# Patient Record
Sex: Female | Born: 1940 | Race: White | Hispanic: No | Smoking: Former smoker
Health system: Southern US, Community
[De-identification: ages and names within clinical notes are randomized; demographics above are authoritative.]

## PROBLEM LIST (undated history)

## (undated) DIAGNOSIS — J449 Chronic obstructive pulmonary disease, unspecified: Secondary | ICD-10-CM

---

## 2007-07-20 ENCOUNTER — Encounter: Admission: RE | Admit: 2007-07-20 | Discharge: 2007-07-20 | Payer: Self-pay | Admitting: Family Medicine

## 2009-09-16 ENCOUNTER — Inpatient Hospital Stay (HOSPITAL_COMMUNITY): Admission: EM | Admit: 2009-09-16 | Discharge: 2009-09-18 | Payer: Self-pay | Admitting: Emergency Medicine

## 2009-09-17 ENCOUNTER — Encounter (INDEPENDENT_AMBULATORY_CARE_PROVIDER_SITE_OTHER): Payer: Self-pay | Admitting: Surgery

## 2010-04-17 LAB — CBC
HCT: 40.2 % (ref 36.0–46.0)
HCT: 43.6 % (ref 36.0–46.0)
Hemoglobin: 15.4 g/dL — ABNORMAL HIGH (ref 12.0–15.0)
MCH: 32.6 pg (ref 26.0–34.0)
MCHC: 34.4 g/dL (ref 30.0–36.0)
MCV: 94.7 fL (ref 78.0–100.0)
Platelets: 222 10*3/uL (ref 150–400)
RDW: 12.9 % (ref 11.5–15.5)
RDW: 12.9 % (ref 11.5–15.5)
WBC: 11.1 10*3/uL — ABNORMAL HIGH (ref 4.0–10.5)
WBC: 13.2 10*3/uL — ABNORMAL HIGH (ref 4.0–10.5)

## 2010-04-17 LAB — DIFFERENTIAL
Basophils Relative: 1 % (ref 0–1)
Eosinophils Absolute: 0.1 10*3/uL (ref 0.0–0.7)
Eosinophils Relative: 0 % (ref 0–5)
Lymphs Abs: 2.1 10*3/uL (ref 0.7–4.0)
Monocytes Absolute: 1.3 10*3/uL — ABNORMAL HIGH (ref 0.1–1.0)
Monocytes Relative: 10 % (ref 3–12)
Neutrophils Relative %: 73 % (ref 43–77)

## 2010-04-17 LAB — COMPREHENSIVE METABOLIC PANEL
ALT: 34 U/L (ref 0–35)
AST: 24 U/L (ref 0–37)
Alkaline Phosphatase: 66 U/L (ref 39–117)
GFR calc Af Amer: >60 mL/min (ref >60)
Glucose, Bld: 106 mg/dL — ABNORMAL HIGH (ref 70–99)
Potassium: 3.7 mEq/L (ref 3.5–5.1)
Sodium: 136 mEq/L (ref 135–145)
Total Protein: 8 g/dL (ref 6.0–8.3)

## 2010-04-17 LAB — HEPATIC FUNCTION PANEL
Bilirubin, Direct: 0.3 mg/dL (ref 0.0–0.3)
Indirect Bilirubin: 0.9 mg/dL (ref 0.3–0.9)
Total Bilirubin: 1.2 mg/dL (ref 0.3–1.2)

## 2010-04-17 LAB — LIPASE, BLOOD: Lipase: 29 U/L (ref 11–59)

## 2020-09-22 ENCOUNTER — Emergency Department (HOSPITAL_COMMUNITY): Payer: Medicare HMO

## 2020-09-22 ENCOUNTER — Other Ambulatory Visit: Payer: Self-pay

## 2020-09-22 ENCOUNTER — Inpatient Hospital Stay (HOSPITAL_COMMUNITY)
Admission: EM | Admit: 2020-09-22 | Discharge: 2020-10-21 | DRG: 871 | Disposition: A | Discharge status: Hospice | Payer: Medicare HMO | Attending: Internal Medicine | Admitting: Internal Medicine

## 2020-09-22 ENCOUNTER — Encounter (HOSPITAL_COMMUNITY): Payer: Self-pay

## 2020-09-22 DIAGNOSIS — E871 Hypo-osmolality and hyponatremia: Secondary | ICD-10-CM | POA: Diagnosis not present

## 2020-09-22 DIAGNOSIS — R7989 Other specified abnormal findings of blood chemistry: Secondary | ICD-10-CM | POA: Diagnosis not present

## 2020-09-22 DIAGNOSIS — I9589 Other hypotension: Secondary | ICD-10-CM | POA: Diagnosis not present

## 2020-09-22 DIAGNOSIS — I5031 Acute diastolic (congestive) heart failure: Secondary | ICD-10-CM | POA: Diagnosis not present

## 2020-09-22 DIAGNOSIS — R570 Cardiogenic shock: Secondary | ICD-10-CM | POA: Diagnosis not present

## 2020-09-22 DIAGNOSIS — I517 Cardiomegaly: Secondary | ICD-10-CM | POA: Diagnosis not present

## 2020-09-22 DIAGNOSIS — K760 Fatty (change of) liver, not elsewhere classified: Secondary | ICD-10-CM | POA: Diagnosis present

## 2020-09-22 DIAGNOSIS — I2781 Cor pulmonale (chronic): Secondary | ICD-10-CM | POA: Diagnosis present

## 2020-09-22 DIAGNOSIS — J432 Centrilobular emphysema: Secondary | ICD-10-CM | POA: Diagnosis present

## 2020-09-22 DIAGNOSIS — J189 Pneumonia, unspecified organism: Secondary | ICD-10-CM | POA: Diagnosis not present

## 2020-09-22 DIAGNOSIS — Z6835 Body mass index (BMI) 35.0-35.9, adult: Secondary | ICD-10-CM | POA: Diagnosis not present

## 2020-09-22 DIAGNOSIS — R0902 Hypoxemia: Secondary | ICD-10-CM | POA: Diagnosis present

## 2020-09-22 DIAGNOSIS — K761 Chronic passive congestion of liver: Secondary | ICD-10-CM | POA: Diagnosis present

## 2020-09-22 DIAGNOSIS — R531 Weakness: Secondary | ICD-10-CM

## 2020-09-22 DIAGNOSIS — R609 Edema, unspecified: Secondary | ICD-10-CM | POA: Diagnosis not present

## 2020-09-22 DIAGNOSIS — I5033 Acute on chronic diastolic (congestive) heart failure: Secondary | ICD-10-CM | POA: Diagnosis not present

## 2020-09-22 DIAGNOSIS — R579 Shock, unspecified: Secondary | ICD-10-CM | POA: Diagnosis not present

## 2020-09-22 DIAGNOSIS — D751 Secondary polycythemia: Secondary | ICD-10-CM | POA: Diagnosis present

## 2020-09-22 DIAGNOSIS — K5900 Constipation, unspecified: Secondary | ICD-10-CM | POA: Diagnosis not present

## 2020-09-22 DIAGNOSIS — Z20822 Contact with and (suspected) exposure to covid-19: Secondary | ICD-10-CM | POA: Diagnosis not present

## 2020-09-22 DIAGNOSIS — K56609 Unspecified intestinal obstruction, unspecified as to partial versus complete obstruction: Secondary | ICD-10-CM

## 2020-09-22 DIAGNOSIS — R109 Unspecified abdominal pain: Secondary | ICD-10-CM

## 2020-09-22 DIAGNOSIS — J9601 Acute respiratory failure with hypoxia: Secondary | ICD-10-CM | POA: Diagnosis not present

## 2020-09-22 DIAGNOSIS — T380X5A Adverse effect of glucocorticoids and synthetic analogues, initial encounter: Secondary | ICD-10-CM | POA: Diagnosis not present

## 2020-09-22 DIAGNOSIS — I2609 Other pulmonary embolism with acute cor pulmonale: Secondary | ICD-10-CM | POA: Diagnosis not present

## 2020-09-22 DIAGNOSIS — I509 Heart failure, unspecified: Secondary | ICD-10-CM | POA: Diagnosis not present

## 2020-09-22 DIAGNOSIS — R778 Other specified abnormalities of plasma proteins: Secondary | ICD-10-CM | POA: Diagnosis not present

## 2020-09-22 DIAGNOSIS — J9621 Acute and chronic respiratory failure with hypoxia: Secondary | ICD-10-CM | POA: Diagnosis not present

## 2020-09-22 DIAGNOSIS — I2721 Secondary pulmonary arterial hypertension: Secondary | ICD-10-CM | POA: Diagnosis present

## 2020-09-22 DIAGNOSIS — L89322 Pressure ulcer of left buttock, stage 2: Secondary | ICD-10-CM | POA: Diagnosis present

## 2020-09-22 DIAGNOSIS — R140 Abdominal distension (gaseous): Secondary | ICD-10-CM

## 2020-09-22 DIAGNOSIS — A419 Sepsis, unspecified organism: Secondary | ICD-10-CM | POA: Diagnosis not present

## 2020-09-22 DIAGNOSIS — L89312 Pressure ulcer of right buttock, stage 2: Secondary | ICD-10-CM | POA: Diagnosis present

## 2020-09-22 DIAGNOSIS — Z515 Encounter for palliative care: Secondary | ICD-10-CM | POA: Diagnosis not present

## 2020-09-22 DIAGNOSIS — Z66 Do not resuscitate: Secondary | ICD-10-CM | POA: Diagnosis not present

## 2020-09-22 DIAGNOSIS — E873 Alkalosis: Secondary | ICD-10-CM | POA: Diagnosis not present

## 2020-09-22 DIAGNOSIS — Z7951 Long term (current) use of inhaled steroids: Secondary | ICD-10-CM

## 2020-09-22 DIAGNOSIS — J9602 Acute respiratory failure with hypercapnia: Secondary | ICD-10-CM | POA: Diagnosis not present

## 2020-09-22 DIAGNOSIS — Z0184 Encounter for antibody response examination: Secondary | ICD-10-CM

## 2020-09-22 DIAGNOSIS — R0602 Shortness of breath: Secondary | ICD-10-CM

## 2020-09-22 DIAGNOSIS — E669 Obesity, unspecified: Secondary | ICD-10-CM | POA: Diagnosis present

## 2020-09-22 DIAGNOSIS — Z7189 Other specified counseling: Secondary | ICD-10-CM | POA: Diagnosis not present

## 2020-09-22 DIAGNOSIS — I50810 Right heart failure, unspecified: Secondary | ICD-10-CM | POA: Diagnosis present

## 2020-09-22 DIAGNOSIS — Z87891 Personal history of nicotine dependence: Secondary | ICD-10-CM

## 2020-09-22 DIAGNOSIS — K567 Ileus, unspecified: Secondary | ICD-10-CM | POA: Diagnosis not present

## 2020-09-22 DIAGNOSIS — J9622 Acute and chronic respiratory failure with hypercapnia: Secondary | ICD-10-CM | POA: Diagnosis not present

## 2020-09-22 DIAGNOSIS — R6520 Severe sepsis without septic shock: Secondary | ICD-10-CM | POA: Diagnosis not present

## 2020-09-22 DIAGNOSIS — N182 Chronic kidney disease, stage 2 (mild): Secondary | ICD-10-CM | POA: Diagnosis present

## 2020-09-22 DIAGNOSIS — I130 Hypertensive heart and chronic kidney disease with heart failure and stage 1 through stage 4 chronic kidney disease, or unspecified chronic kidney disease: Secondary | ICD-10-CM | POA: Diagnosis present

## 2020-09-22 DIAGNOSIS — R079 Chest pain, unspecified: Secondary | ICD-10-CM

## 2020-09-22 DIAGNOSIS — R0600 Dyspnea, unspecified: Secondary | ICD-10-CM

## 2020-09-22 DIAGNOSIS — H9190 Unspecified hearing loss, unspecified ear: Secondary | ICD-10-CM | POA: Diagnosis present

## 2020-09-22 DIAGNOSIS — K56600 Partial intestinal obstruction, unspecified as to cause: Secondary | ICD-10-CM | POA: Diagnosis not present

## 2020-09-22 DIAGNOSIS — R739 Hyperglycemia, unspecified: Secondary | ICD-10-CM | POA: Diagnosis not present

## 2020-09-22 DIAGNOSIS — R911 Solitary pulmonary nodule: Secondary | ICD-10-CM | POA: Diagnosis present

## 2020-09-22 DIAGNOSIS — I272 Pulmonary hypertension, unspecified: Secondary | ICD-10-CM | POA: Diagnosis not present

## 2020-09-22 DIAGNOSIS — L8990 Pressure ulcer of unspecified site, unspecified stage: Secondary | ICD-10-CM | POA: Diagnosis present

## 2020-09-22 DIAGNOSIS — I5082 Biventricular heart failure: Secondary | ICD-10-CM | POA: Diagnosis present

## 2020-09-22 DIAGNOSIS — R6521 Severe sepsis with septic shock: Secondary | ICD-10-CM | POA: Diagnosis present

## 2020-09-22 DIAGNOSIS — Z0189 Encounter for other specified special examinations: Secondary | ICD-10-CM

## 2020-09-22 DIAGNOSIS — R682 Dry mouth, unspecified: Secondary | ICD-10-CM | POA: Diagnosis not present

## 2020-09-22 DIAGNOSIS — I4891 Unspecified atrial fibrillation: Secondary | ICD-10-CM | POA: Diagnosis not present

## 2020-09-22 DIAGNOSIS — J441 Chronic obstructive pulmonary disease with (acute) exacerbation: Secondary | ICD-10-CM | POA: Diagnosis not present

## 2020-09-22 DIAGNOSIS — N179 Acute kidney failure, unspecified: Secondary | ICD-10-CM | POA: Diagnosis present

## 2020-09-22 DIAGNOSIS — N029 Recurrent and persistent hematuria with unspecified morphologic changes: Secondary | ICD-10-CM | POA: Diagnosis not present

## 2020-09-22 DIAGNOSIS — J9620 Acute and chronic respiratory failure, unspecified whether with hypoxia or hypercapnia: Secondary | ICD-10-CM | POA: Diagnosis not present

## 2020-09-22 DIAGNOSIS — Z7982 Long term (current) use of aspirin: Secondary | ICD-10-CM

## 2020-09-22 DIAGNOSIS — R21 Rash and other nonspecific skin eruption: Secondary | ICD-10-CM | POA: Diagnosis not present

## 2020-09-22 DIAGNOSIS — J9811 Atelectasis: Secondary | ICD-10-CM | POA: Diagnosis present

## 2020-09-22 DIAGNOSIS — K920 Hematemesis: Secondary | ICD-10-CM | POA: Diagnosis not present

## 2020-09-22 DIAGNOSIS — E876 Hypokalemia: Secondary | ICD-10-CM | POA: Diagnosis not present

## 2020-09-22 DIAGNOSIS — Z28310 Unvaccinated for covid-19: Secondary | ICD-10-CM

## 2020-09-22 DIAGNOSIS — Z634 Disappearance and death of family member: Secondary | ICD-10-CM

## 2020-09-22 DIAGNOSIS — I25110 Atherosclerotic heart disease of native coronary artery with unstable angina pectoris: Secondary | ICD-10-CM | POA: Diagnosis present

## 2020-09-22 DIAGNOSIS — B3749 Other urogenital candidiasis: Secondary | ICD-10-CM | POA: Diagnosis not present

## 2020-09-22 DIAGNOSIS — J9600 Acute respiratory failure, unspecified whether with hypoxia or hypercapnia: Secondary | ICD-10-CM

## 2020-09-22 DIAGNOSIS — N200 Calculus of kidney: Secondary | ICD-10-CM | POA: Diagnosis present

## 2020-09-22 HISTORY — DX: Chronic obstructive pulmonary disease, unspecified: J44.9

## 2020-09-22 LAB — MAGNESIUM: Magnesium: 2.6 mg/dL — ABNORMAL HIGH (ref 1.7–2.4)

## 2020-09-22 LAB — CBC WITH DIFFERENTIAL/PLATELET
Abs Immature Granulocytes: 0.6 10*3/uL — ABNORMAL HIGH (ref 0.00–0.07)
Basophils Absolute: 0 10*3/uL (ref 0.0–0.1)
Basophils Relative: 0 %
Eosinophils Absolute: 0 10*3/uL (ref 0.0–0.5)
Eosinophils Relative: 0 %
HCT: 47.4 % — ABNORMAL HIGH (ref 36.0–46.0)
Hemoglobin: 15.6 g/dL — ABNORMAL HIGH (ref 12.0–15.0)
Immature Granulocytes: 3 %
Lymphocytes Relative: 8 %
Lymphs Abs: 1.8 10*3/uL (ref 0.7–4.0)
MCH: 32.4 pg (ref 26.0–34.0)
MCHC: 32.9 g/dL (ref 30.0–36.0)
MCV: 98.3 fL (ref 80.0–100.0)
Monocytes Absolute: 2.5 10*3/uL — ABNORMAL HIGH (ref 0.1–1.0)
Monocytes Relative: 12 %
Neutro Abs: 16.7 10*3/uL — ABNORMAL HIGH (ref 1.7–7.7)
Neutrophils Relative %: 77 %
Platelets: 283 10*3/uL (ref 150–400)
RBC: 4.82 MIL/uL (ref 3.87–5.11)
RDW: 14.6 % (ref 11.5–15.5)
WBC: 21.6 10*3/uL — ABNORMAL HIGH (ref 4.0–10.5)
nRBC: 0.3 % — ABNORMAL HIGH (ref 0.0–0.2)

## 2020-09-22 LAB — FIBRINOGEN: Fibrinogen: >800 mg/dL — ABNORMAL HIGH (ref 210–475)

## 2020-09-22 LAB — BLOOD GAS, VENOUS
Acid-Base Excess: 3.1 mmol/L — ABNORMAL HIGH (ref 0.0–2.0)
Bicarbonate: 31.1 mmol/L — ABNORMAL HIGH (ref 20.0–28.0)
O2 Saturation: 68.3 %
Patient temperature: 98.6
pCO2, Ven: 63 mmHg — ABNORMAL HIGH (ref 44.0–60.0)
pH, Ven: 7.314 (ref 7.250–7.430)
pO2, Ven: 42.9 mmHg (ref 32.0–45.0)

## 2020-09-22 LAB — COMPREHENSIVE METABOLIC PANEL
ALT: 439 U/L — ABNORMAL HIGH (ref 0–44)
AST: 503 U/L — ABNORMAL HIGH (ref 15–41)
Albumin: 3.3 g/dL — ABNORMAL LOW (ref 3.5–5.0)
Alkaline Phosphatase: 102 U/L (ref 38–126)
Anion gap: 11 (ref 5–15)
BUN: 69 mg/dL — ABNORMAL HIGH (ref 8–23)
CO2: 28 mmol/L (ref 22–32)
Calcium: 8.5 mg/dL — ABNORMAL LOW (ref 8.9–10.3)
Chloride: 99 mmol/L (ref 98–111)
Creatinine, Ser: 1.28 mg/dL — ABNORMAL HIGH (ref 0.44–1.00)
GFR, Estimated: 43 mL/min — ABNORMAL LOW (ref >60)
Glucose, Bld: 125 mg/dL — ABNORMAL HIGH (ref 70–99)
Potassium: 3.6 mmol/L (ref 3.5–5.1)
Sodium: 138 mmol/L (ref 135–145)
Total Bilirubin: 1.4 mg/dL — ABNORMAL HIGH (ref 0.3–1.2)
Total Protein: 7.3 g/dL (ref 6.5–8.1)

## 2020-09-22 LAB — BLOOD GAS, ARTERIAL
Acid-Base Excess: 0.6 mmol/L (ref 0.0–2.0)
Bicarbonate: 27.9 mmol/L (ref 20.0–28.0)
FIO2: 52
O2 Saturation: 91.2 %
Patient temperature: 97.6
pCO2 arterial: 56.2 mmHg — ABNORMAL HIGH (ref 32.0–48.0)
pH, Arterial: 7.314 — ABNORMAL LOW (ref 7.350–7.450)
pO2, Arterial: 69.4 mmHg — ABNORMAL LOW (ref 83.0–108.0)

## 2020-09-22 LAB — CK: Total CK: 60 U/L (ref 38–234)

## 2020-09-22 LAB — FERRITIN: Ferritin: 1135 ng/mL — ABNORMAL HIGH (ref 11–307)

## 2020-09-22 LAB — TRIGLYCERIDES: Triglycerides: 78 mg/dL (ref <150)

## 2020-09-22 LAB — C-REACTIVE PROTEIN: CRP: 37.4 mg/dL — ABNORMAL HIGH (ref <1.0)

## 2020-09-22 LAB — LACTIC ACID, PLASMA: Lactic Acid, Venous: 1.7 mmol/L (ref 0.5–1.9)

## 2020-09-22 LAB — D-DIMER, QUANTITATIVE: D-Dimer, Quant: 1.97 ug/mL-FEU — ABNORMAL HIGH (ref 0.00–0.50)

## 2020-09-22 LAB — RESP PANEL BY RT-PCR (FLU A&B, COVID) ARPGX2
Influenza A by PCR: NEGATIVE
Influenza B by PCR: NEGATIVE
SARS Coronavirus 2 by RT PCR: NEGATIVE

## 2020-09-22 LAB — PROTIME-INR
INR: 1.3 — ABNORMAL HIGH (ref 0.8–1.2)
Prothrombin Time: 16 seconds — ABNORMAL HIGH (ref 11.4–15.2)

## 2020-09-22 LAB — BRAIN NATRIURETIC PEPTIDE: B Natriuretic Peptide: 889.7 pg/mL — ABNORMAL HIGH (ref 0.0–100.0)

## 2020-09-22 LAB — PROCALCITONIN: Procalcitonin: 0.48 ng/mL

## 2020-09-22 LAB — LACTATE DEHYDROGENASE: LDH: 518 U/L — ABNORMAL HIGH (ref 98–192)

## 2020-09-22 LAB — PHOSPHORUS: Phosphorus: 3.8 mg/dL (ref 2.5–4.6)

## 2020-09-22 MED ORDER — METHYLPREDNISOLONE SODIUM SUCC 125 MG IJ SOLR
60.0000 mg | Freq: Two times a day (BID) | INTRAMUSCULAR | Status: AC
  Administered 2020-09-22 – 2020-09-23 (×2): 60 mg via INTRAVENOUS
  Filled 2020-09-22 (×2): qty 2

## 2020-09-22 MED ORDER — SODIUM CHLORIDE 0.9 % IV SOLN
1.0000 g | Freq: Once | INTRAVENOUS | Status: AC
  Administered 2020-09-22: 1 g via INTRAVENOUS
  Filled 2020-09-22: qty 10

## 2020-09-22 MED ORDER — IPRATROPIUM-ALBUTEROL 0.5-2.5 (3) MG/3ML IN SOLN
3.0000 mL | Freq: Four times a day (QID) | RESPIRATORY_TRACT | Status: DC
  Administered 2020-09-22 – 2020-09-26 (×10): 3 mL via RESPIRATORY_TRACT
  Filled 2020-09-22 (×11): qty 3

## 2020-09-22 MED ORDER — IOHEXOL 350 MG/ML SOLN
80.0000 mL | Freq: Once | INTRAVENOUS | Status: AC | PRN
  Administered 2020-09-22: 80 mL via INTRAVENOUS

## 2020-09-22 MED ORDER — ASPIRIN EC 81 MG PO TBEC
81.0000 mg | DELAYED_RELEASE_TABLET | Freq: Every day | ORAL | Status: DC
  Administered 2020-09-23 – 2020-09-25 (×2): 81 mg via ORAL
  Filled 2020-09-22 (×2): qty 1

## 2020-09-22 MED ORDER — ENOXAPARIN SODIUM 40 MG/0.4ML IJ SOSY
40.0000 mg | PREFILLED_SYRINGE | INTRAMUSCULAR | Status: DC
  Administered 2020-09-22 – 2020-09-23 (×2): 40 mg via SUBCUTANEOUS
  Filled 2020-09-22 (×2): qty 0.4

## 2020-09-22 MED ORDER — ALBUTEROL SULFATE (2.5 MG/3ML) 0.083% IN NEBU: 2.5000 mg | INHALATION_SOLUTION | RESPIRATORY_TRACT | Status: DC | PRN

## 2020-09-22 MED ORDER — SODIUM CHLORIDE 0.9 % IV SOLN
75.0000 mL/h | INTRAVENOUS | Status: DC
  Administered 2020-09-22: 75 mL/h via INTRAVENOUS

## 2020-09-22 MED ORDER — GUAIFENESIN ER 600 MG PO TB12
600.0000 mg | ORAL_TABLET | Freq: Two times a day (BID) | ORAL | Status: DC
  Administered 2020-09-22 – 2020-10-08 (×31): 600 mg via ORAL
  Filled 2020-09-22 (×32): qty 1

## 2020-09-22 MED ORDER — SODIUM CHLORIDE 0.9 % IV SOLN
500.0000 mg | INTRAVENOUS | Status: DC
  Administered 2020-09-23 – 2020-09-24 (×2): 500 mg via INTRAVENOUS
  Filled 2020-09-22 (×2): qty 500

## 2020-09-22 MED ORDER — SODIUM CHLORIDE 0.9 % IV SOLN
2.0000 g | INTRAVENOUS | Status: AC
  Administered 2020-09-23 – 2020-09-29 (×7): 2 g via INTRAVENOUS
  Filled 2020-09-22 (×2): qty 2
  Filled 2020-09-22 (×2): qty 20
  Filled 2020-09-22 (×2): qty 2
  Filled 2020-09-22: qty 20

## 2020-09-22 MED ORDER — SODIUM CHLORIDE 0.9 % IV SOLN
500.0000 mg | Freq: Once | INTRAVENOUS | Status: AC
  Administered 2020-09-22: 500 mg via INTRAVENOUS
  Filled 2020-09-22: qty 500

## 2020-09-22 MED ORDER — PREDNISONE 20 MG PO TABS
40.0000 mg | ORAL_TABLET | Freq: Every day | ORAL | Status: DC
  Administered 2020-09-25: 40 mg via ORAL
  Filled 2020-09-22: qty 2

## 2020-09-22 NOTE — ED Triage Notes (Signed)
Pt reports shob, body aches, decreased appetite. Ems reports 78% on RA>, 94% on 6lpm [place]. A&O x4.

## 2020-09-22 NOTE — Plan of Care (Signed)

## 2020-09-22 NOTE — ED Provider Notes (Signed)
[facility]-EMERGENCY DEPT Provider Note   CSN: [id number] Arrival date & time: 09/22/20  1319     History Chief Complaint  Patient presents with   Shortness of Breath    Kimberly Ballard is a 80 y.o. female.  80 year old female presents with several days of cough congestion body aches.  Decreased appetite.  Patient had positive COVID exposure and has not had any of the COVID-vaccine series.  No vomiting or diarrhea.  No loss of taste or smell.  Endorses whole body weakness.  No urinary symptoms.  Called EMS and pulse oximetry was 78% room air which increased 94% on 6 L.      History reviewed. No pertinent past medical history.  There are no problems to display for this patient.   History reviewed. No pertinent surgical history.   OB History   No obstetric history on file.     History reviewed. No pertinent family history.     Home Medications Prior to Admission medications   Not on File    Allergies    Patient has no known allergies.  Review of Systems   Review of Systems  All other systems reviewed and are negative.  Physical Exam Updated Vital Signs BP 116/77   Pulse 80   Temp 98.2 F (36.8 C)   Resp 20   SpO2 95%   Physical Exam Vitals and nursing note reviewed.  Constitutional:      General: She is not in acute distress.    Appearance: Normal appearance. She is well-developed. She is not toxic-appearing.  HENT:     Head: Normocephalic and atraumatic.  Eyes:     General: Lids are normal.     Conjunctiva/sclera: Conjunctivae normal.     Pupils: Pupils are equal, round, and reactive to light.  Neck:     Thyroid: No thyroid mass.     Trachea: No tracheal deviation.  Cardiovascular:     Rate and Rhythm: Normal rate and regular rhythm.     Heart sounds: Normal heart sounds. No murmur heard.   No gallop.  Pulmonary:     Effort: Tachypnea and prolonged expiration present. No respiratory distress.     Breath sounds: Decreased  air movement present. No stridor. Examination of the right-lower field reveals decreased breath sounds. Examination of the left-lower field reveals decreased breath sounds. Decreased breath sounds present. No wheezing, rhonchi or rales.  Abdominal:     General: There is no distension.     Palpations: Abdomen is soft.     Tenderness: There is no abdominal tenderness. There is no rebound.  Musculoskeletal:        General: No tenderness. Normal range of motion.     Cervical back: Normal range of motion and neck supple.  Skin:    General: Skin is warm and dry.     Findings: No abrasion or rash.  Neurological:     Mental Status: She is alert and oriented to person, place, and time. Mental status is at baseline.     GCS: GCS eye subscore is 4. GCS verbal subscore is 5. GCS motor subscore is 6.     Cranial Nerves: Cranial nerves are intact. No cranial nerve deficit.     Sensory: No sensory deficit.     Motor: Motor function is intact.  Psychiatric:        Attention and Perception: Attention normal.        Speech: Speech normal.  Behavior: Behavior normal.    ED Results / Procedures / Treatments   Labs (all labs ordered are listed, but only abnormal results are displayed) Labs Reviewed  RESP PANEL BY RT-PCR (FLU A&B, COVID) ARPGX2  CULTURE, BLOOD (ROUTINE X 2)  CULTURE, BLOOD (ROUTINE X 2)  LACTIC ACID, PLASMA  LACTIC ACID, PLASMA  CBC WITH DIFFERENTIAL/PLATELET  COMPREHENSIVE METABOLIC PANEL  D-DIMER, QUANTITATIVE  PROCALCITONIN  LACTATE DEHYDROGENASE  FERRITIN  TRIGLYCERIDES  FIBRINOGEN  C-REACTIVE PROTEIN    EKG EKG Interpretation  Date/Time:  Monday September 22 2020 14:41:36 EDT Ventricular Rate:  77 PR Interval:  157 QRS Duration: 93 QT Interval:  431 QTC Calculation: 488 R Axis:   79 Text Interpretation: Sinus rhythm Abnormal T, consider ischemia, anterior leads Confirmed by Lorre Nick ([id number]) on 09/22/2020 2:59:14 PM  Radiology No results  found.  Procedures Procedures   Medications Ordered in ED Medications - No data to display  ED Course  I have reviewed the triage vital signs and the nursing notes.  Pertinent labs & imaging results that were available during my care of the patient were reviewed by me and considered in my medical decision making (see chart for details).    MDM Rules/Calculators/A&P                            Final Clinical Impression(s) / ED Diagnoses Final diagnoses:  None   Patient's COVID test is negative here.  Significant leukocytosis noted on CBC.  Blood cultures obtained.  Chest x-ray consistent with atypical infection.  Started on IV antibiotics.  D-dimer elevated here.  CT angio chest for PE ordered.  Signed out to Dr. Rodena Medin Rx / DC Orders ED Discharge Orders     None        Lorre Nick, MD 09/22/20 [phone]

## 2020-09-22 NOTE — ED Provider Notes (Signed)
Patient seen after prior ED provider.  Patient will be admitted for treatment of pneumonia.  She understands plan of care.  CT Chest was negative for PE.  Hospitalist service is aware of case and will evaluate for admission.   Wynetta Fines, MD 09/22/20 [phone]

## 2020-09-22 NOTE — H&P (Signed)
Kimberly Ballard UVO:[id number] DOB: 29-Feb-1940 DOA: 09/22/2020     PCP: System, Provider Not In   Outpatient Specialists:  NONE    Patient arrived to ER on 09/22/20 at 1319 Referred by Attending Toy Baker, MD   Patient coming from: home Lives alone,      Chief Complaint:   Chief Complaint  Patient presents with   Shortness of Breath    HPI: Kimberly Ballard is a 80 y.o. female with medical history significant of COPD     Presented with  SOB, body aches decreased appetite coughing.  Her daughter had COVID last week.  Patient went to her primary care provider on 18 August was started on Paxlovid empirically Due to risk of exposure.  She continued to progress and feel poorly at which point she called EMS on their arrival patient was satting 78% on room air started on 6 L and improved up to 94 Patient is not vaccinated for COVID No nausea no vomiting no diarrhea no loss of taste or smell.  No urinary complaints.  When she was prescribed Paxlovid she was told to stop her inhalers She used to smoke but quit Drinks EtOH occasionally Pt does not see doctors very often  Infectious risk factors:  Reports  fever, shortness of breath, dry cough,    Has   been vaccinated against COVID    Initial COVID TEST  NEGATIVE   Lab Results  Component Value Date   [place] 09/22/2020     Regarding pertinent Chronic problems:      COPD - not  followed by pulmonology      Recent Labs    09/22/20 1441  HGB 15.6*    While in ER: Patient tested negative for COVID chest x-ray showed bilateral infiltrates CTA negative for PE but with evidence of bilateral infiltrates also noted elevated white blood cell count patient started on Rocephin azithromycin for CAP coverage   ED Triage Vitals  Enc Vitals Group     BP 09/22/20 1334 127/77     Pulse Rate 09/22/20 1334 85     Resp 09/22/20 1334 20     Temp 09/22/20 1334 98.2 F (36.8 C)     Temp src --      SpO2 09/22/20  1334 93 %     Weight --      Height --      Head Circumference --      Peak Flow --      Pain Score 09/22/20 1338 6     Pain Loc --      Pain Edu? --      Excl. in [place]? --   TMAX(24)@     _________________________________________ Significant initial  Findings: Abnormal Labs Reviewed  CBC WITH DIFFERENTIAL/PLATELET - Abnormal; Notable for the following components:      Result Value   WBC 21.6 (*)    Hemoglobin 15.6 (*)    HCT 47.4 (*)    nRBC 0.3 (*)    Neutro Abs 16.7 (*)    Monocytes Absolute 2.5 (*)    Abs Immature Granulocytes 0.60 (*)    All other components within normal limits  COMPREHENSIVE METABOLIC PANEL - Abnormal; Notable for the following components:   Glucose, Bld 125 (*)    BUN 69 (*)    Creatinine, Ser 1.28 (*)    Calcium 8.5 (*)    Albumin 3.3 (*)    AST 503 (*)    ALT 439 (*)  Total Bilirubin 1.4 (*)    GFR, Estimated 43 (*)    All other components within normal limits  D-DIMER, QUANTITATIVE - Abnormal; Notable for the following components:   D-Dimer, Quant 1.97 (*)    All other components within normal limits  LACTATE DEHYDROGENASE - Abnormal; Notable for the following components:   LDH 518 (*)    All other components within normal limits  FERRITIN - Abnormal; Notable for the following components:   Ferritin 1,135 (*)    All other components within normal limits  FIBRINOGEN - Abnormal; Notable for the following components:   Fibrinogen >800 (*)    All other components within normal limits  C-REACTIVE PROTEIN - Abnormal; Notable for the following components:   CRP 37.4 (*)    All other components within normal limits  PROTIME-INR - Abnormal; Notable for the following components:   Prothrombin Time 16.0 (*)    INR 1.3 (*)    All other components within normal limits  BLOOD GAS, VENOUS - Abnormal; Notable for the following components:   pCO2, Ven 63.0 (*)    Bicarbonate 31.1 (*)    Acid-Base Excess 3.1 (*)    All other components within normal  limits  BRAIN NATRIURETIC PEPTIDE - Abnormal; Notable for the following components:   B Natriuretic Peptide 889.7 (*)    All other components within normal limits  MAGNESIUM - Abnormal; Notable for the following components:   Magnesium 2.6 (*)    All other components within normal limits  BLOOD GAS, ARTERIAL - Abnormal; Notable for the following components:   pH, Arterial 7.314 (*)    pCO2 arterial 56.2 (*)    pO2, Arterial 69.4 (*)    All other components within normal limits  TROPONIN I (HIGH SENSITIVITY) - Abnormal; Notable for the following components:   Troponin I (High Sensitivity) 128 (*)    All other components within normal limits   ____________________________________________ Ordered    CXR - infection    CTA chest -   no PE,  evidence of infiltrate   ______________________   ECG: Ordered Personally reviewed by me showing: HR : 77 Rhythm: NSR   nonspecific changes,  QTC 488 ____________  The recent clinical data is shown below. Vitals:   09/22/20 1645 09/22/20 1700 09/22/20 1736 09/22/20 1800  BP: 127/75 112/74 115/67 109/66  Pulse: 78 76 78 85  Resp: 20 18 (!) 22 (!) 26  Temp:      SpO2: 93% 93% 92% 92%    WBC     Component Value Date/Time   WBC 21.6 (H) 09/22/2020 1441   LYMPHSABS PENDING 09/22/2020 1441   MONOABS PENDING 09/22/2020 1441   EOSABS PENDING 09/22/2020 1441   BASOSABS PENDING 09/22/2020 1441    Lactic Acid, Venous    Component Value Date/Time   LATICACIDVEN 1.7 09/22/2020 1441     Procalcitonin 0.48      UA  ordered     Results for orders placed or performed during the hospital encounter of 09/22/20  Resp Panel by RT-PCR (Flu A&B, Covid) Nasopharyngeal Swab     Status: None   Collection Time: 09/22/20  2:41 PM   Specimen: Nasopharyngeal Swab; Nasopharyngeal(NP) swabs in vial transport medium  Result Value Ref Range Status   SARS Coronavirus 2 by RT PCR NEGATIVE NEGATIVE Final         Influenza A by PCR NEGATIVE NEGATIVE Final    Influenza B by PCR NEGATIVE NEGATIVE Final  _______________________________________________ Hospitalist was called for admission for CAP , acute respiratory failure with hypoxia - hypercapnia  The following Work up has been ordered so far:  Orders Placed This Encounter  Procedures   Resp Panel by RT-PCR (Flu A&B, Covid) Nasopharyngeal Swab   Blood Culture (routine x 2)   DG Chest Port 1 View   CT Angio Chest PE W/Cm &/Or Wo Cm   CBC WITH DIFFERENTIAL   Comprehensive metabolic panel   D-dimer, quantitative   Procalcitonin   Lactate dehydrogenase   Ferritin   Triglycerides   Fibrinogen   C-reactive protein   Diet NPO time specified   Cardiac monitoring   Insert peripheral IV x 2   Initiate Carrier Fluid Protocol   Place surgical mask on patient   Patient to wear surgical mask during transportation   Assess patient for ability to self-prone. If able (can move self in bed, ambulate) and stable (SpO2 and oxygen requirement):   RN/NT - Document specific oxygen requirements in [facility]   Notify EDP if new oxygen requirements escalates > 4L per minute [place]   RN to draw the following extra tubes:   Cardiac monitoring   Consult to hospitalist  Pneumonia   Airborne and Contact precautions   Pulse oximetry, continuous   ED EKG 12-Lead   Admit to Inpatient (patient's expected length of stay will be greater than 2 midnights or inpatient only procedure)      Following Medications were ordered in ER: Medications  azithromycin (ZITHROMAX) 500 mg in sodium chloride 0.9 % 250 mL IVPB (has no administration in time range)  cefTRIAXone (ROCEPHIN) 1 g in sodium chloride 0.9 % 100 mL IVPB (1 g Intravenous New Bag/Given 09/22/20 1736)  iohexol (OMNIPAQUE) 350 MG/ML injection 80 mL (80 mLs Intravenous Contrast Given 09/22/20 1723)        Consult Orders  (From admission, onward)           Start     Ordered   09/22/20 1806  Consult to hospitalist  Pneumonia  Once       Comments:  Pneumonia  Provider:  (Not yet assigned)  Question Answer Comment  Place call to: [facility]   Reason for Consult Admit      09/22/20 1805              OTHER Significant initial  Findings:  labs showing:    Recent Labs  Lab 09/22/20 1441  NA 138  K 3.6  CO2 28  GLUCOSE 125*  BUN 69*  CREATININE 1.28*  CALCIUM 8.5*     Cr  Up from baseline see below Lab Results  Component Value Date   CREATININE 1.28 (H) 09/22/2020   CREATININE 0.67 09/16/2009    Recent Labs  Lab 09/22/20 1441  AST 503*  ALT 439*  ALKPHOS 102  BILITOT 1.4*  PROT 7.3  ALBUMIN 3.3*   Lab Results  Component Value Date   CALCIUM 8.5 (L) 09/22/2020    Plt: Lab Results  Component Value Date   PLT 283 09/22/2020     COVID-19 Labs  Recent Labs    09/22/20 1441  DDIMER 1.97*  FERRITIN 1,135*  LDH 518*  CRP 37.4*    Lab Results  Component Value Date   SARSCOV2NAA NEGATIVE 09/22/2020    Venous  Blood Gas result:  pH 7.314 pCO2  63  ABG 7.314/56.2    Recent Labs  Lab 09/22/20 1441  WBC 21.6*  NEUTROABS PENDING  HGB 15.6*  HCT  47.4*  MCV 98.3  PLT 283    HG/HCT  Up from baseline see below    Component Value Date/Time   HGB 15.6 (H) 09/22/2020 1441   HCT 47.4 (H) 09/22/2020 1441   MCV 98.3 09/22/2020 1441          Cultures: No results found for: SDES, SPECREQUEST, CULT, REPTSTATUS   Radiological Exams on Admission: CT Angio Chest PE W/Cm &/Or Wo Cm  Result Date: 09/22/2020 CLINICAL DATA:  Shortness of breath, cough, elevated D-dimer EXAM: CT ANGIOGRAPHY CHEST WITH CONTRAST TECHNIQUE: Multidetector CT imaging of the chest was performed using the standard protocol during bolus administration of intravenous contrast. Multiplanar CT image reconstructions and MIPs were obtained to evaluate the vascular anatomy. CONTRAST:  48m OMNIPAQUE IOHEXOL 350 MG/ML SOLN COMPARISON:  Same day chest x-ray FINDINGS: Cardiovascular: Satisfactory opacification of the pulmonary  arteries. Evaluation of the distal pulmonary arterial branches within the bilateral lung bases is degraded by respiratory motion artifact. No filling defect is visualized to the segmental branch levels. Main pulmonary trunk is dilated measuring 4.3 cm in diameter suggestive of underlying pulmonary arterial hypertension. Thoracic aorta is nonaneurysmal. Atherosclerotic calcifications of the aorta and coronary arteries. Cardiomegaly. No pericardial effusion. Mediastinum/Nodes: Mildly prominent precarinal lymph node measures 11 mm short axis (series 5, image 84). Mildly prominent right hilar lymph node measuring approximately 11 mm short axis (series 5, image 110). Mildly prominent left hilar lymph node measuring 10 mm (series 5, image 112) no axillary lymphadenopathy. Thyroid and trachea within normal limits. Small hiatal hernia. No esophageal abnormality is evident. Lungs/Pleura: Dependent consolidations within the bilateral lower lobes and lingula. Additional patchy areas of consolidation predominantly within the dependent regions, some of which have a somewhat nodular configuration including 10 mm nodular opacity in the superior segment of the left lower lobe (series 10, image 51) and 8 mm nodular opacity within the posterior right middle lobe (series 10, image 88). Calcified pleural plaque at the posterior right lung base. No pleural effusion or pneumothorax. Diffuse bronchial wall thickening bilaterally. Upper Abdomen: Reflux of contrast into the IVC and hepatic veins. No acute findings are seen within the visualized upper abdomen. Musculoskeletal: Multilevel thoracic spondylosis with exaggerated thoracic kyphosis. No acute bony findings. No chest wall abnormality. Review of the MIP images confirms the above findings. IMPRESSION: 1. Negative for pulmonary embolism to the segmental branch levels. 2. Dependent consolidations within the bilateral lower lobes and lingula with additional patchy areas of opacity.  Findings are favored to represent multifocal pneumonia. Distribution raises the suspicion for aspiration. 3. Some of the focal areas of airspace opacity have a somewhat nodular configuration. A repeat chest CT in 3 months is recommended to exclude an underlying pulmonary nodule following a trial of antibiotic therapy. 4. Mildly prominent mediastinal and bilateral hilar lymph nodes, likely reactive. 5. Cardiomegaly with evidence of underlying pulmonary arterial hypertension. 6. Reflux of contrast into the IVC and hepatic veins suggesting right heart dysfunction. 7. Small hiatal hernia. Aortic Atherosclerosis (ICD10-I70.0). Electronically Signed   By: NDavina PokeD.O.   On: 09/22/2020 18:01   DG Chest Port 1 View  Result Date: 09/22/2020 CLINICAL DATA:  Shortness of breath and body aches. Hypoxia. Leukocytosis. EXAM: PORTABLE CHEST 1 VIEW COMPARISON:  None. FINDINGS: The cardiac silhouette is enlarged. The hila are also prominent bilaterally which may reflect enlarged pulmonary arteries or lymphadenopathy. Aortic atherosclerosis is noted. The interstitial markings are diffusely increased in both lungs with peribronchial thickening. No sizable pleural effusion or pneumothorax is  identified. No acute osseous abnormality is seen. IMPRESSION: 1. Cardiomegaly with bilateral interstitial densities which may reflect infection (including atypical/viral etiologies) versus edema. 2. Bilateral hilar lymphadenopathy versus pulmonary arterial enlargement. Electronically Signed   By: Logan Bores M.D.   On: 09/22/2020 15:32   _______________________________________________________________________________________________________ Latest  Blood pressure 109/66, pulse 85, temperature 98.2 F (36.8 C), resp. rate (!) 26, SpO2 92 %.   Review of Systems:    Pertinent positives include:  Fevers, chills, fatigue,   Constitutional:  No weight loss, night sweats, weight loss  HEENT:  No headaches, Difficulty  swallowing,Tooth/dental problems,Sore throat,  No sneezing, itching, ear ache, nasal congestion, post nasal drip,  Cardio-vascular:  No chest pain, Orthopnea, PND, anasarca, dizziness, palpitations.no Bilateral lower extremity swelling  GI:  No heartburn, indigestion, abdominal pain, nausea, vomiting, diarrhea, change in bowel habits, loss of appetite, melena, blood in stool, hematemesis Resp:  no shortness of breath at rest. No dyspnea on exertion, No excess mucus, no productive cough, No non-productive cough, No coughing up of blood.No change in color of mucus.No wheezing. Skin:  no rash or lesions. No jaundice GU:  no dysuria, change in color of urine, no urgency or frequency. No straining to urinate.  No flank pain.  Musculoskeletal:  No joint pain or no joint swelling. No decreased range of motion. No back pain.  Psych:  No change in mood or affect. No depression or anxiety. No memory loss.  Neuro: no localizing neurological complaints, no tingling, no weakness, no double vision, no gait abnormality, no slurred speech, no confusion  All systems reviewed and apart from [place] all are negative _______________________________________________________________________________________________ Past Medical History:  History reviewed. No pertinent past medical history.    History reviewed. No pertinent surgical history.  Social History:  Ambulatory   independently       reports that she has quit smoking. Her smoking use included cigarettes. She has never used smokeless tobacco. She reports current alcohol use of about 2.0 standard drinks per week. She reports that she does not use drugs.    Family History:   Family History  Problem Relation Age of Onset   Diabetes Neg Hx    CAD Neg Hx    ______________________________________________________________________________________________ Allergies: No Known Allergies   Prior to Admission medications   Medication Sig Start Date End  Date Taking? Authorizing Provider  aspirin 81 MG EC tablet Take by mouth.    [provider]  PAXLOVID, 300/100, 20 x 150 MG & 10 x 100MG TBPK Take 3 tablets by mouth 2 (two) times daily. 09/18/20   [provider]  TRELEGY ELLIPTA 100-62.5-25 MCG/INH AEPB Inhale 1 puff into the lungs daily. 08/26/20   [provider]    ___________________________________________________________________________________________________ Physical Exam: YBOFBP with BMI 09/22/2020 09/22/2020 02/02/5850  Systolic 778 242 353  Diastolic 66 67 74  Pulse 85 78 76     1. General:  in No  Acute distress   acutely ill -appearing 2. Psychological: Alert and  Oriented 3. Head/ENT:    Dry Mucous Membranes                          Head Non traumatic, neck supple                         Poor Dentition 4. SKIN: decreased Skin turgor,  Skin clean Dry and intact no rash 5. Heart: Regular rate and rhythm no  Murmur,  no Rub or gallop 6. Lungs:   some wheezes and  crackles   7. Abdomen: Soft,  non-tender, Non distended   obese  bowel sounds present 8. Lower extremities: no clubbing, cyanosis, no  edema 9. Neurologically Grossly intact, moving all 4 extremities equally  intact 10. MSK: Normal range of motion    Chart has been reviewed  ______________________________________________________________________________________________  Assessment/Plan  80 y.o. female with medical history significant of COPD  Admitted for multilobar PNA    Present on Admission:  Sepsis ([place]) -  -SIRS criteria met with  elevated white blood cell count,       Component Value Date/Time   WBC 21.6 (H) 09/22/2020 1441   LYMPHSABS 1.8 09/22/2020 1441    fever   RR >20   -Most likely source being: pulmonary,   Patient meeting criteria for Severe sepsis with    evidence of end organ damage/organ dysfunction such as      Acute hypoxia requiring new supplemental oxygen, SpO2: 93 % O2 Flow Rate (L/min): 8 L/min      - Obtain serial lactic acid and procalcitonin level.  - Initiated IV antibiotics in ER: Antibiotics Given (last 72 hours)     Date/Time Action Medication Dose Rate   09/22/20 1736 New Bag/Given   cefTRIAXone (ROCEPHIN) 1 g in sodium chloride 0.9 % 100 mL IVPB 1 g 200 mL/hr   09/22/20 1839 New Bag/Given   azithromycin (ZITHROMAX) 500 mg in sodium chloride 0.9 % 250 mL IVPB 500 mg 250 mL/hr     Will continue     - await results of blood and urine culture  - Rehydrate aggressively  Intravenous fluids were administered,      12:37 AM   CAP (community acquired pneumonia) -  - will admit for treatment of CAP will start on appropriate antibiotic coverage.   Obtain:  sputum cultures,                  Obtain respiratory panel                   blood cultures if febrile or if decompensates.                   strep pneumo UA antigen,                   check for Legionella.                Provide oxygen as needed.   If continues to deteriorate would consult PCCM COVID-negative although inflammatory markers elevated.  Patient has had 2 days of Paxlovid and stopped.  Never tested positive for COVID   Acute respiratory failure with hypoxia and hypercapnia ([place])- ABG showing chronic hypercapnia discussed with PCCM who is aware pulmonology consult in a.m.  Repeat ABG at 3 AM to make sure the patient does not become more significantly hypercarbic and decompensates. Patient likely has underlying severe COPD   Elevated LFTs - possilby sepsis responce obtain hepatitis serologies patient does drink occasional alcohol but not significantly denies Tylenol overuse. Per Family   Cardiomegaly -obtain echogram CT scan worrisome for right heart failure.  Suspect likely underlying pulmonary hypertension. Would appreciate pulmonology/cardiology consult and follow-up establishment   Elevated troponin -likely demand ischemia in the setting of hypoxia.  Emailed cardiology.  Obtain echogram continue to cycle  cardiac enzymes  COPD likely  COPD exacerbation.  We will make sure has albuterol as needed and scheduled DuoNeb.  Steroid taper would benefit from pulmonology consult and follow-up  Other plan as per orders.  DVT prophylaxis:   Lovenox       Code Status:    Code Status: Not on file FULL CODE   as per patient   I had personally discussed CODE STATUS with patient     Family Communication:   Family not at  Bedside  plan of care was discussed on the phone with   Son,    Disposition Plan:    To home once workup is complete and patient is stable   Following barriers for discharge:                            Electrolytes corrected                                                                Afebrile, white count improving able to transition to PO antibiotics                             Will need to be able to tolerate PO                            Will likely need home health, home O2, set up                           Will need consultants to evaluate patient prior to discharge                       Would benefit from PT/OT eval prior to DC  Ordered                    Transition of care consulted                   Nutrition    consulted                                      Consults called:  emailed cardiology  Admission status:  ED Disposition     ED Disposition  [place]: [place] [[id number]]  Level of Care: Progressive [102]  Admit to Progressive based on following criteria: MULTISYSTEM THREATS such as stable sepsis, metabolic/electrolyte imbalance with or without encephalopathy that is responding to early treatment.  May admit patient to [facility] or [facility] if equivalent level of care is available:: No  Covid Evaluation: Confirmed COVID Negative  Diagnosis: CAP (community acquired pneumonia) [[id number]]  Admitting Physician: Toy Baker [[id number]]  Attending Physician: Toy Baker [[id number]]  Estimated  length of stay: past midnight tomorrow  Certification:: I certify this patient will need inpatient services for at least 2 midnights            inpatient     I Expect 2 midnight stay secondary to severity of patient's current illness need for inpatient interventions justified by the following:  hemodynamic  instability despite optimal treatment (tachycardia  tachypnea  hypoxia, hypercapnia)  Severe lab/radiological/exam abnormalities including:    Multilobar PNA and extensive comorbidities including:  COPD/asthma    That are currently affecting medical management.   I expect  patient to be hospitalized for 2 midnights requiring inpatient medical care.  Patient is at high risk for adverse outcome (such as loss of life or disability) if not treated.  Indication for inpatient stay as follows:   Hemodynamic instability despite maximal medical therapy,    inability to maintain oral hydration    New or worsening hypoxia  Need for IV antibiotics, IV fluids,     Level of care   progressive  tele indefinitely please discontinue once patient no longer qualifies COVID-19 Labs    Lab Results  Component Value Date   [place] 09/22/2020     Precautions: admitted as  Covid Negative    PPE: Used by the provider:   N95   eye Goggles,  Gloves    Katalyna Socarras 09/23/2020, 1:01 AM    [facility]     after 2 AM please page floor coverage PA If 7AM-7PM, please contact the day team taking care of the patient using [email or web address]   Patient was evaluated in the context of the global COVID-19 pandemic, which necessitated consideration that the patient might be at risk for infection with the SARS-CoV-2 virus that causes COVID-19. Institutional protocols and algorithms that pertain to the evaluation of patients at risk for COVID-19 are in a state of rapid change based on information released by regulatory bodies including the CDC and federal and state organizations.  These policies and algorithms were followed during the patient's care.

## 2020-09-23 ENCOUNTER — Inpatient Hospital Stay (HOSPITAL_COMMUNITY): Payer: Medicare HMO

## 2020-09-23 ENCOUNTER — Telehealth: Payer: Self-pay | Admitting: Physician Assistant

## 2020-09-23 DIAGNOSIS — J9601 Acute respiratory failure with hypoxia: Secondary | ICD-10-CM

## 2020-09-23 DIAGNOSIS — R7989 Other specified abnormal findings of blood chemistry: Secondary | ICD-10-CM | POA: Diagnosis not present

## 2020-09-23 DIAGNOSIS — J9602 Acute respiratory failure with hypercapnia: Secondary | ICD-10-CM

## 2020-09-23 DIAGNOSIS — I517 Cardiomegaly: Secondary | ICD-10-CM

## 2020-09-23 DIAGNOSIS — R778 Other specified abnormalities of plasma proteins: Secondary | ICD-10-CM | POA: Diagnosis not present

## 2020-09-23 DIAGNOSIS — R0600 Dyspnea, unspecified: Secondary | ICD-10-CM | POA: Diagnosis not present

## 2020-09-23 DIAGNOSIS — J189 Pneumonia, unspecified organism: Secondary | ICD-10-CM | POA: Diagnosis not present

## 2020-09-23 LAB — CBC WITH DIFFERENTIAL/PLATELET
Abs Immature Granulocytes: 0.54 10*3/uL — ABNORMAL HIGH (ref 0.00–0.07)
Basophils Absolute: 0 10*3/uL (ref 0.0–0.1)
Basophils Relative: 0 %
Eosinophils Absolute: 0 10*3/uL (ref 0.0–0.5)
Eosinophils Relative: 0 %
HCT: 49.4 % — ABNORMAL HIGH (ref 36.0–46.0)
Hemoglobin: 15.7 g/dL — ABNORMAL HIGH (ref 12.0–15.0)
Immature Granulocytes: 3 %
Lymphocytes Relative: 4 %
Lymphs Abs: 0.8 10*3/uL (ref 0.7–4.0)
MCH: 32 pg (ref 26.0–34.0)
MCHC: 31.8 g/dL (ref 30.0–36.0)
MCV: 100.6 fL — ABNORMAL HIGH (ref 80.0–100.0)
Monocytes Absolute: 1.5 10*3/uL — ABNORMAL HIGH (ref 0.1–1.0)
Monocytes Relative: 8 %
Neutro Abs: 16.9 10*3/uL — ABNORMAL HIGH (ref 1.7–7.7)
Neutrophils Relative %: 85 %
Platelets: 253 10*3/uL (ref 150–400)
RBC: 4.91 MIL/uL (ref 3.87–5.11)
RDW: 14.7 % (ref 11.5–15.5)
WBC: 19.7 10*3/uL — ABNORMAL HIGH (ref 4.0–10.5)
nRBC: 0.2 % (ref 0.0–0.2)

## 2020-09-23 LAB — RESPIRATORY PANEL BY PCR

## 2020-09-23 LAB — BLOOD GAS, ARTERIAL
Acid-Base Excess: 1.8 mmol/L (ref 0.0–2.0)
Bicarbonate: 29.4 mmol/L — ABNORMAL HIGH (ref 20.0–28.0)
O2 Saturation: 91 %
Patient temperature: 97.6
pCO2 arterial: 58.5 mmHg — ABNORMAL HIGH (ref 32.0–48.0)
pH, Arterial: 7.318 — ABNORMAL LOW (ref 7.350–7.450)
pO2, Arterial: 68.4 mmHg — ABNORMAL LOW (ref 83.0–108.0)

## 2020-09-23 LAB — PHOSPHORUS: Phosphorus: 3.5 mg/dL (ref 2.5–4.6)

## 2020-09-23 LAB — ECHOCARDIOGRAM COMPLETE
Area-P 1/2: 2.95 cm2
Calc EF: 60.5 %
Height: 63 in
S' Lateral: 2.5 cm
Single Plane A2C EF: 61.8 %
Single Plane A4C EF: 62.4 %
Weight: 3206.4 oz

## 2020-09-23 LAB — COMPREHENSIVE METABOLIC PANEL
ALT: 327 U/L — ABNORMAL HIGH (ref 0–44)
AST: 170 U/L — ABNORMAL HIGH (ref 15–41)
Albumin: 3.1 g/dL — ABNORMAL LOW (ref 3.5–5.0)
Alkaline Phosphatase: 97 U/L (ref 38–126)
Anion gap: 12 (ref 5–15)
BUN: 69 mg/dL — ABNORMAL HIGH (ref 8–23)
CO2: 25 mmol/L (ref 22–32)
Calcium: 8.3 mg/dL — ABNORMAL LOW (ref 8.9–10.3)
Chloride: 102 mmol/L (ref 98–111)
Creatinine, Ser: 1.04 mg/dL — ABNORMAL HIGH (ref 0.44–1.00)
GFR, Estimated: 55 mL/min — ABNORMAL LOW (ref >60)
Glucose, Bld: 141 mg/dL — ABNORMAL HIGH (ref 70–99)
Potassium: 3.9 mmol/L (ref 3.5–5.1)
Sodium: 139 mmol/L (ref 135–145)
Total Bilirubin: 1.1 mg/dL (ref 0.3–1.2)
Total Protein: 6.8 g/dL (ref 6.5–8.1)

## 2020-09-23 LAB — TROPONIN I (HIGH SENSITIVITY)
Troponin I (High Sensitivity): 128 ng/L (ref <18)
Troponin I (High Sensitivity): 100 ng/L (ref <18)

## 2020-09-23 LAB — LIPID PANEL
Cholesterol: 104 mg/dL (ref 0–200)
HDL: 25 mg/dL — ABNORMAL LOW (ref >40)
LDL Cholesterol: 63 mg/dL (ref 0–99)
Total CHOL/HDL Ratio: 4.2 RATIO
Triglycerides: 80 mg/dL (ref <150)
VLDL: 16 mg/dL (ref 0–40)

## 2020-09-23 LAB — HEPATITIS PANEL, ACUTE
HCV Ab: NONREACTIVE
Hep A IgM: NONREACTIVE
Hep B C IgM: NONREACTIVE
Hepatitis B Surface Ag: NONREACTIVE

## 2020-09-23 LAB — D-DIMER, QUANTITATIVE: D-Dimer, Quant: 2.04 ug/mL-FEU — ABNORMAL HIGH (ref 0.00–0.50)

## 2020-09-23 LAB — MAGNESIUM: Magnesium: 2.7 mg/dL — ABNORMAL HIGH (ref 1.7–2.4)

## 2020-09-23 LAB — TSH: TSH: 1.988 u[IU]/mL (ref 0.350–4.500)

## 2020-09-23 LAB — PROCALCITONIN: Procalcitonin: 0.49 ng/mL

## 2020-09-23 MED ORDER — FUROSEMIDE 10 MG/ML IJ SOLN
40.0000 mg | Freq: Once | INTRAMUSCULAR | Status: AC
  Administered 2020-09-23: 40 mg via INTRAVENOUS
  Filled 2020-09-23: qty 4

## 2020-09-23 MED ORDER — CHLORHEXIDINE GLUCONATE CLOTH 2 % EX PADS
6.0000 | MEDICATED_PAD | Freq: Every day | CUTANEOUS | Status: DC
  Administered 2020-09-24 – 2020-10-20 (×27): 6 via TOPICAL

## 2020-09-23 NOTE — Telephone Encounter (Signed)
I have scheduled this patient for a PFT and OV with Dr. Judeth Horn on 10/17/2020 per request of Dr. Rutherford Guys. PFT is at 10 am and then OV is right after at 11 am. This will print on AVS when patient is discharged. If anything else is needed please let me know. Routing to providers as FYI.

## 2020-09-23 NOTE — Progress Notes (Signed)
eLink Physician-Brief Progress Note Patient Name: Kimberly Ballard DOB: 01/19/1941 MRN: [id number]   Date of Service  09/23/2020  HPI/Events of Note  Patient admitted with acute hypoxemic respiratory failure and lung infiltrates suggestive of pneumonitis in the context of Covid exposure.  eICU Interventions  New Patient Evaluation.        Migdalia Dk 09/23/2020, 8:27 PM

## 2020-09-23 NOTE — Evaluation (Signed)
Clinical/Bedside Swallow Evaluation Patient Details  Name: Kimberly Ballard MRN: [id number] Date of Birth: 1940-11-29  Today's Date: 09/23/2020 Time: SLP Start Time (ACUTE ONLY): 1520 SLP Stop Time (ACUTE ONLY): 1601 SLP Time Calculation (min) (ACUTE ONLY): 41 min  Past Medical History:  Past Medical History:  Diagnosis Date   COPD (chronic obstructive pulmonary disease) (HCC)    Past Surgical History: History reviewed. No pertinent surgical history. HPI:  80 yo female adm to [facility] with respiratory difficulties.  Pt with PMH + for tobacco use, COPD. Pt was around family member that recently had COVID 19- and pt tested negative upon admit.  She was diagnosed with multifocal pneumonia, diastolic CHF, with increased troponins.  Pt's esophagus showed no abnormality per imaging - but pt has a Small hiatal hernia.  Swallow evaluation ordered.  Pt denies dysphagia nor reflux prior to admit.   Assessment / Plan / Recommendation Clinical Impression  No focal CN deficits apparent and pt easily passed 3 ounce Yale water challenge.  She was observed to consume 3 ounces peaches, 4 ounces thin water, 4 ounces sherbert and 3 graham crackers. Throat clearing post swallow with x4 of 11 swallows of peaches increasing in incidence as po progressed.  No coughing or throat clearing with intake of graham crackers nor ice cream.  Pt pointed to her right lateral neck to indicate area of "film" after swallowing peaches - ? due to syrup infiltration.  Pt denies relux symptoms - however given her significant dysphonia, inconsistent throat clearing after swallowing water, peaches and thin liquids - SLP questions if she could be experiencing "silent reflux".  Recommend to consider dedicated esophageal evaluation -- esophagram? to assure reflux is not contributing to pulmonary issues.  Of note, pt advised her vocal hoarseness initiated prior to pulmonary symptoms.  SLP provided pt with education for compensations given her current  respiratory status - focusing on exhale-swallow -exhale pattern for airway safety measure, starting intake with liquids given xerostomia and importance of oral care for pulmonary hygiene. Will follow up once this week to assure pt tolerating po intake and for ultiliy of and implementation of swallow precautions.  Provided pt information in writing and using teach back.  RN informed. SLP Visit Diagnosis: Dysphagia, unspecified (R13.10)    Aspiration Risk  Mild aspiration risk    Diet Recommendation Regular;Thin liquid   Liquid Administration via: Cup;Straw Medication Administration: Whole meds with liquid Supervision: Patient able to self feed Compensations: Minimize environmental distractions;Slow rate;Small sips/bites (start intake with liquids) Postural Changes: Seated upright at 90 degrees;Remain upright for at least 30 minutes after po intake    Other  Recommendations Oral Care Recommendations: Oral care BID   Follow up Recommendations None      Frequency and Duration min 1 x/week  1 week       Prognosis Prognosis for Safe Diet Advancement: Good      Swallow Study   General Date of Onset: 09/23/20 HPI: 80 yo female adm to [facility] with respiratory difficulties.  Pt with PMH + for tobacco use, COPD. Pt was around family member that recently had COVID 19- and pt tested negative upon admit.  She was diagnosed with multifocal pneumonia, diastolic CHF, with increased troponins.  Pt's esophagus showed no abnormality per imaging - but pt has a Small hiatal hernia.  Swallow evaluation ordered.  Pt denies dysphagia nor reflux prior to admit. Type of Study: Bedside Swallow Evaluation Diet Prior to this Study: Regular;Thin liquids Temperature Spikes Noted: No Respiratory Status:  Nasal cannula (7 liters) History of Recent Intubation: No Behavior/Cognition: Alert;Cooperative;Pleasant mood Oral Cavity Assessment: Within Functional Limits Oral Care Completed by SLP: No Oral Cavity - Dentition:  Adequate natural dentition;Other (Comment) (lower partial - does not fit well and she does not use it to eat) Vision: Functional for self-feeding Self-Feeding Abilities: Able to feed self Patient Positioning: Upright in bed Baseline Vocal Quality: Hoarse (hoarse - pt reports hoarseness occured first before her respiratory issues) Volitional Cough: Strong (nonproductive) Volitional Swallow: Able to elicit    Oral/Motor/Sensory Function Overall Oral Motor/Sensory Function: Within functional limits   Ice Chips Ice chips: Within functional limits Presentation: Spoon   Thin Liquid Thin Liquid: Impaired Pharyngeal  Phase Impairments: Throat Clearing - Delayed Other Comments: throat clear x2 of 6 swallows    Nectar Thick Nectar Thick Liquid: Not tested   Honey Thick Honey Thick Liquid: Not tested   Puree Puree: Within functional limits Presentation: Self Fed;Spoon   Solid     Solid: Impaired Presentation: Self Fed Oral Phase Impairments: Reduced lingual movement/coordination;Impaired mastication Oral Phase Functional Implications: Impaired mastication;Prolonged oral transit Pharyngeal Phase Impairments: Throat Clearing - Delayed Other Comments: throat clearing x4 of 11 swallows of peaches - no issues with crackers causing throat clearing or indication of dysphagia; Pt pointed to her right lateral neck to indicate area of "film" after swallowing peaches - ? due to syrup infiltration      Chales Abrahams 09/23/2020,4:34 PM [name], MS [facility] SLP Acute Rehab Services Office [phone] Pager [phone]

## 2020-09-23 NOTE — Progress Notes (Signed)
  Echocardiogram 2D Echocardiogram has been performed.  Janalyn Harder 09/23/2020, 12:39 PM

## 2020-09-23 NOTE — Progress Notes (Signed)
OT Cancellation Note  Patient Details Name: JENNALYNN RIVARD MRN: [id number] DOB: 25-Mar-1940   Cancelled Treatment:    Reason Eval/Treat Not Completed: Other (comment). Per conversation with pt's RN, pt has been SOB with minimal activity, has needed up to 9L O2 (RA at baseline). RN requests holding OT for today to focus on stabilizing respiratory function. Plan to reattempt tomorrow.   Raynald Kemp, OT Acute Rehabilitation Services Pager: [phone] Office: [phone]  09/23/2020, 12:51 PM

## 2020-09-23 NOTE — Progress Notes (Signed)
[facility]  PROGRESS NOTE  Kimberly Ballard KNL:[id number] DOB: [birth year] DOA: 09/22/2020 PCP: System, Provider Not In   Brief HPI:   80 year old female with history of COPD presented with shortness of breath, body aches, decreased appetite and coughing.  Patients daughter was diagnosed with COVID last week.  She was started on Paxil empirically due to risk of exposure.  Patient continued to get worse and developed shortness of breath.  On EMS arrival she required 6 L/min of oxygen.  In the ED SARS-CoV-2 RT-PCR was negative.  Chest x-ray showed bilateral infiltrates, CTA was negative for PE but showed evidence of bilateral infiltrates.  Patient started on Rocephin and Zithromax for community-acquired pneumonia.    Subjective   Still continues to have shortness of breath.  BNP was elevated to 884.   Assessment/Plan:    Acute hypoxemic respiratory failure -Multifactorial from underlying multifocal pneumonia, diastolic CHF -Patient is currently on Rocephin and Zithromax -Follow blood culture results -We will give 1 dose of Lasix 40 mg IV -Speech therapy consulted for swallow evaluation -Appreciate PCCM consult  Acute CHF, diastolic versus systolic -Echocardiogram is currently pending -She has bilateral crackles on auscultation -We will give 1 dose of Lasix 40 mg IV -Cardiology has been consulted  Elevated troponin -Likely demand ischemia -EKG shows T wave abnormalities in anterior chest leads -Cardiology has been consulted  Transaminitis -Unclear etiology -?  Passive liver congestion from CHF -Total bilirubin is normal, alk phos is normal -Follow LFTs in a.m., if persistently elevated, consider abdominal ultrasound  Right middle lobe nodule -Will need repeat CT in 3 months after treatment of pneumonia   Scheduled medications:    aspirin EC  81 mg Oral Daily   enoxaparin (LOVENOX) injection  40 mg Subcutaneous Q24H   guaiFENesin  600 mg Oral BID    ipratropium-albuterol  3 mL Nebulization Q6H   [START ON 09/24/2020] predniSONE  40 mg Oral Q breakfast         Data Reviewed:   CBG:  No results for input(s): GLUCAP in the last 168 hours.  SpO2: (!) 88 % O2 Flow Rate (L/min): 8 L/min    Vitals:   09/23/20 0535 09/23/20 0834 09/23/20 0955 09/23/20 1328  BP: 122/76  136/75   Pulse: 71  75   Resp:   (!) 26   Temp:   97.9 F (36.6 C)   TempSrc:   Oral   SpO2: 94% 97% 95% (!) 88%  Weight:      Height:         Intake/Output Summary (Last 24 hours) at 09/23/2020 1442 Last data filed at 09/23/2020 1200 Gross per 24 hour  Intake 593.98 ml  Output 1 ml  Net 592.98 ml    08/21 1901 - 08/23 0700 In: 594 [I.V.:494] Out: -   Filed Weights   09/22/20 2020  Weight: 90.9 kg    CBC:  Recent Labs  Lab 09/22/20 1441 09/23/20 0207  WBC 21.6* 19.7*  HGB 15.6* 15.7*  HCT 47.4* 49.4*  PLT 283 253  MCV 98.3 100.6*  MCH 32.4 32.0  MCHC 32.9 31.8  RDW 14.6 14.7  LYMPHSABS 1.8 0.8  MONOABS 2.5* 1.5*  EOSABS 0.0 0.0  BASOSABS 0.0 0.0    Complete metabolic panel:  Recent Labs  Lab 09/22/20 1441 09/22/20 1500 09/22/20 2050 09/22/20 2148 09/23/20 0207  NA 138  --   --   --  139  K 3.6  --   --   --  3.9  CL 99  --   --   --  102  CO2 28  --   --   --  25  GLUCOSE 125*  --   --   --  141*  BUN 69*  --   --   --  69*  CREATININE 1.28*  --   --   --  1.04*  CALCIUM 8.5*  --   --   --  8.3*  AST 503*  --   --   --  170*  ALT 439*  --   --   --  327*  ALKPHOS 102  --   --   --  97  BILITOT 1.4*  --   --   --  1.1  ALBUMIN 3.3*  --   --   --  3.1*  MG  --  2.6*  --   --  2.7*  CRP 37.4*  --   --   --   --   DDIMER 1.97*  --   --   --   --   PROCALCITON 0.48  --   --   --  0.49  LATICACIDVEN 1.7  --   --   --   --   INR  --   --  1.3*  --   --   TSH  --   --   --   --  1.988  BNP  --   --   --  889.7*  --     No results for input(s): LIPASE, AMYLASE in the last 168 hours.  Recent Labs  Lab  09/22/20 1441 09/22/20 2148 09/23/20 0207  CRP 37.4*  --   --   DDIMER 1.97*  --   --   BNP  --  889.7*  --   PROCALCITON 0.48  --  0.49  SARSCOV2NAA NEGATIVE  --   --     ------------------------------------------------------------------------------------------------------------------ Recent Labs    09/22/20 1441 09/23/20 0207  CHOL  --  104  HDL  --  25*  LDLCALC  --  63  TRIG 78 80  CHOLHDL  --  4.2    No results found for: HGBA1C ------------------------------------------------------------------------------------------------------------------ Recent Labs    09/23/20 0207  TSH 1.988   ------------------------------------------------------------------------------------------------------------------ Recent Labs    09/22/20 1441  FERRITIN 1,135*    Coagulation profile Recent Labs  Lab 09/22/20 2050  INR 1.3*   Recent Labs    09/22/20 1441  DDIMER 1.97*    Cardiac Enzymes Recent Labs  Lab 09/22/20 2148  CKTOTAL 60    ------------------------------------------------------------------------------------------------------------------    Component Value Date/Time   BNP 889.7 (H) 09/22/2020 2148     Antibiotics: Anti-infectives (From admission, onward)    Start     Dose/Rate Route Frequency Ordered Stop   09/23/20 1800  cefTRIAXone (ROCEPHIN) 2 g in sodium chloride 0.9 % 100 mL IVPB        2 g 200 mL/hr over 30 Minutes Intravenous Every 24 hours 09/22/20 1842 09/28/20 1759   09/23/20 1800  azithromycin (ZITHROMAX) 500 mg in sodium chloride 0.9 % 250 mL IVPB        500 mg 250 mL/hr over 60 Minutes Intravenous Every 24 hours 09/22/20 1842 09/28/20 1759   09/22/20 1630  cefTRIAXone (ROCEPHIN) 1 g in sodium chloride 0.9 % 100 mL IVPB        1 g 200 mL/hr over 30 Minutes Intravenous  Once 09/22/20 1618 09/22/20 1806   09/22/20  1630  azithromycin (ZITHROMAX) 500 mg in sodium chloride 0.9 % 250 mL IVPB        500 mg 250 mL/hr over 60 Minutes Intravenous   Once 09/22/20 1618 09/22/20 1939        Radiology Reports  CT Angio Chest PE W/Cm &/Or Wo Cm  Result Date: 09/22/2020 CLINICAL DATA:  Shortness of breath, cough, elevated D-dimer EXAM: CT ANGIOGRAPHY CHEST WITH CONTRAST TECHNIQUE: Multidetector CT imaging of the chest was performed using the standard protocol during bolus administration of intravenous contrast. Multiplanar CT image reconstructions and MIPs were obtained to evaluate the vascular anatomy. CONTRAST:  77m OMNIPAQUE IOHEXOL 350 MG/ML SOLN COMPARISON:  Same day chest x-ray FINDINGS: Cardiovascular: Satisfactory opacification of the pulmonary arteries. Evaluation of the distal pulmonary arterial branches within the bilateral lung bases is degraded by respiratory motion artifact. No filling defect is visualized to the segmental branch levels. Main pulmonary trunk is dilated measuring 4.3 cm in diameter suggestive of underlying pulmonary arterial hypertension. Thoracic aorta is nonaneurysmal. Atherosclerotic calcifications of the aorta and coronary arteries. Cardiomegaly. No pericardial effusion. Mediastinum/Nodes: Mildly prominent precarinal lymph node measures 11 mm short axis (series 5, image 84). Mildly prominent right hilar lymph node measuring approximately 11 mm short axis (series 5, image 110). Mildly prominent left hilar lymph node measuring 10 mm (series 5, image 112) no axillary lymphadenopathy. Thyroid and trachea within normal limits. Small hiatal hernia. No esophageal abnormality is evident. Lungs/Pleura: Dependent consolidations within the bilateral lower lobes and lingula. Additional patchy areas of consolidation predominantly within the dependent regions, some of which have a somewhat nodular configuration including 10 mm nodular opacity in the superior segment of the left lower lobe (series 10, image 51) and 8 mm nodular opacity within the posterior right middle lobe (series 10, image 88). Calcified pleural plaque at the  posterior right lung base. No pleural effusion or pneumothorax. Diffuse bronchial wall thickening bilaterally. Upper Abdomen: Reflux of contrast into the IVC and hepatic veins. No acute findings are seen within the visualized upper abdomen. Musculoskeletal: Multilevel thoracic spondylosis with exaggerated thoracic kyphosis. No acute bony findings. No chest wall abnormality. Review of the MIP images confirms the above findings. IMPRESSION: 1. Negative for pulmonary embolism to the segmental branch levels. 2. Dependent consolidations within the bilateral lower lobes and lingula with additional patchy areas of opacity. Findings are favored to represent multifocal pneumonia. Distribution raises the suspicion for aspiration. 3. Some of the focal areas of airspace opacity have a somewhat nodular configuration. A repeat chest CT in 3 months is recommended to exclude an underlying pulmonary nodule following a trial of antibiotic therapy. 4. Mildly prominent mediastinal and bilateral hilar lymph nodes, likely reactive. 5. Cardiomegaly with evidence of underlying pulmonary arterial hypertension. 6. Reflux of contrast into the IVC and hepatic veins suggesting right heart dysfunction. 7. Small hiatal hernia. Aortic Atherosclerosis (ICD10-I70.0). Electronically Signed   By: NDavina PokeD.O.   On: 09/22/2020 18:01   DG Chest Port 1 View  Result Date: 09/22/2020 CLINICAL DATA:  Shortness of breath and body aches. Hypoxia. Leukocytosis. EXAM: PORTABLE CHEST 1 VIEW COMPARISON:  None. FINDINGS: The cardiac silhouette is enlarged. The hila are also prominent bilaterally which may reflect enlarged pulmonary arteries or lymphadenopathy. Aortic atherosclerosis is noted. The interstitial markings are diffusely increased in both lungs with peribronchial thickening. No sizable pleural effusion or pneumothorax is identified. No acute osseous abnormality is seen. IMPRESSION: 1. Cardiomegaly with bilateral interstitial densities which  may reflect infection (including atypical/viral etiologies)  versus edema. 2. Bilateral hilar lymphadenopathy versus pulmonary arterial enlargement. Electronically Signed   By: Logan Bores M.D.   On: 09/22/2020 15:32   US Abdomen Limited RUQ (LIVER/GB)  Result Date: 09/23/2020 CLINICAL DATA:  80 year old female with abnormal LFTs. EXAM: ULTRASOUND ABDOMEN LIMITED RIGHT UPPER QUADRANT COMPARISON:  CTA chest 09/22/2020. FINDINGS: Gallbladder: Absent as seen on the CTA yesterday. Common bile duct: Diameter: 3 mm, normal. Liver: Echogenic liver (images 14, 18). No discrete liver lesion. Portal vein is patent on color Doppler imaging with normal direction of blood flow towards the liver. Mildly nodular liver contour on some images such (images 29 and 35). Other: Visible right kidney is within normal limits.  No free fluid. IMPRESSION: 1. Hepatic steatosis, with mildly nodular liver contour raising the possibility of cirrhosis. 2. Absent gallbladder with no evidence of bile duct obstruction. Electronically Signed   By: Genevie Ann M.D.   On: 09/23/2020 06:45      DVT prophylaxis: Lovenox  Code Status: Full code  Family Communication: No family at bedside   Consultants: PCCM Cardiology  Procedures:     Objective    Physical Examination:  General-appears in no acute distress Heart-S1-S2, regular, no murmur auscultated Lungs-bibasilar crackles auscultated, scattered wheezing Abdomen-soft, nontender, no organomegaly Extremities-trace edema in the lower extremities Neuro-alert, oriented x3, no focal deficit noted   Status is: Inpatient  Dispo: The patient is from: Home              Anticipated d/c is to: Home              Anticipated d/c date is: 09/28/2020              Patient currently currently not stable for discharge  Barrier to discharge-ongoing treatment for acute hypoxemic respiratory failure  COVID-19 Labs  Recent Labs    09/22/20 1441  DDIMER 1.97*  FERRITIN 1,135*  LDH  518*  CRP 37.4*    Lab Results  Component Value Date   [facility] NEGATIVE 09/22/2020    Microbiology  Recent Results (from the past 240 hour(s))  Resp Panel by RT-PCR (Flu A&B, Covid) Nasopharyngeal Swab     Status: None   Collection Time: 09/22/20  2:41 PM   Specimen: Nasopharyngeal Swab; Nasopharyngeal(NP) swabs in vial transport medium  Result Value Ref Range Status   SARS Coronavirus 2 by RT PCR NEGATIVE NEGATIVE Final    Comment: (NOTE) SARS-CoV-2 target nucleic acids are NOT DETECTED.  The SARS-CoV-2 RNA is generally detectable in upper respiratory specimens during the acute phase of infection. The lowest concentration of SARS-CoV-2 viral copies this assay can detect is 138 copies/mL. A negative result does not preclude SARS-Cov-2 infection and should not be used as the sole basis for treatment or other patient management decisions. A negative result may occur with  improper specimen collection/handling, submission of specimen other than nasopharyngeal swab, presence of viral mutation(s) within the areas targeted by this assay, and inadequate number of viral copies(<138 copies/mL). A negative result must be combined with clinical observations, patient history, and epidemiological information. The expected result is Negative.  Fact Sheet for Patients:  EntrepreneurPulse.com.au  Fact Sheet for Healthcare Providers:  IncredibleEmployment.be  This test is no t yet approved or cleared by the [place] FDA and  has been authorized for detection and/or diagnosis of SARS-CoV-2 by FDA under an Emergency Use Authorization (EUA). This EUA will remain  in effect (meaning this test can be used) for the duration of the COVID-19 declaration  under Section 564(b)(1) of the Act, 21 U.S.C.section 360bbb-3(b)(1), unless the authorization is terminated  or revoked sooner.       Influenza A by PCR NEGATIVE NEGATIVE Final   Influenza B by PCR  NEGATIVE NEGATIVE Final    Comment: (NOTE) The Xpert Xpress SARS-CoV-2/FLU/RSV plus assay is intended as an aid in the diagnosis of influenza from Nasopharyngeal swab specimens and should not be used as a sole basis for treatment. Nasal washings and aspirates are unacceptable for Xpert Xpress SARS-CoV-2/FLU/RSV testing.  Fact Sheet for Patients: EntrepreneurPulse.com.au  Fact Sheet for Healthcare Providers: IncredibleEmployment.be  This test is not yet approved or cleared by the [place] FDA and has been authorized for detection and/or diagnosis of SARS-CoV-2 by FDA under an Emergency Use Authorization (EUA). This EUA will remain in effect (meaning this test can be used) for the duration of the COVID-19 declaration under Section 564(b)(1) of the Act, 21 U.S.C. section 360bbb-3(b)(1), unless the authorization is terminated or revoked.  Performed at [facility], [place] [address], [place], [place] [address]   Blood Culture (routine x 2)     Status: None (Preliminary result)   Collection Time: 09/22/20  2:41 PM   Specimen: BLOOD  Result Value Ref Range Status   Specimen Description   Final    BLOOD BLOOD LEFT FOREARM Performed at [facility] [address], [place], [place] [address]    Special Requests   Final    BOTTLES DRAWN AEROBIC AND ANAEROBIC Blood Culture adequate volume Performed at [facility] [address], [place], [place] [address]    Culture   Final    NO GROWTH < 24 HOURS Performed at [facility] [address] [place], [place] [address]    Report Status PENDING  Incomplete  Blood Culture (routine x 2)     Status: None (Preliminary result)   Collection Time: 09/22/20  5:20 PM   Specimen: BLOOD LEFT HAND  Result Value Ref Range Status   Specimen Description   Final    BLOOD LEFT HAND Performed at [facility], [place] [address], [place],  [place] [address]    Special Requests   Final    BOTTLES DRAWN AEROBIC AND ANAEROBIC Blood Culture results may not be optimal due to an excessive volume of blood received in culture bottles Performed at [facility] [address], [place], [place] [address]    Culture   Final    NO GROWTH < 12 HOURS Performed at [facility] [address] [place], [place] [address]    Report Status PENDING  Incomplete  Respiratory (~20 pathogens) panel by PCR     Status: None   Collection Time: 09/22/20 10:47 PM   Specimen: Nasopharyngeal Swab; Respiratory  Result Value Ref Range Status   Adenovirus NOT DETECTED NOT DETECTED Final   Coronavirus 229E NOT DETECTED NOT DETECTED Final    Comment: (NOTE) The Coronavirus on the Respiratory Panel, DOES NOT test for the novel  Coronavirus (2019 nCoV)    Coronavirus HKU1 NOT DETECTED NOT DETECTED Final   Coronavirus NL63 NOT DETECTED NOT DETECTED Final   Coronavirus OC43 NOT DETECTED NOT DETECTED Final   Metapneumovirus NOT DETECTED NOT DETECTED Final   Rhinovirus / Enterovirus NOT DETECTED NOT DETECTED Final   Influenza A NOT DETECTED NOT DETECTED Final   Influenza B NOT DETECTED NOT DETECTED Final   Parainfluenza Virus 1 NOT DETECTED NOT DETECTED Final  Parainfluenza Virus 2 NOT DETECTED NOT DETECTED Final   Parainfluenza Virus 3 NOT DETECTED NOT DETECTED Final   Parainfluenza Virus 4 NOT DETECTED NOT DETECTED Final   Respiratory Syncytial Virus NOT DETECTED NOT DETECTED Final   Bordetella pertussis NOT DETECTED NOT DETECTED Final   Bordetella Parapertussis NOT DETECTED NOT DETECTED Final   Chlamydophila pneumoniae NOT DETECTED NOT DETECTED Final   Mycoplasma pneumoniae NOT DETECTED NOT DETECTED Final    Comment: Performed at [facility], [place] [address], [place], [place] [address]             [facility] If 7PM-7AM, please contact night-coverage at [email or web address], Office   [phone]   09/23/2020, 2:42 PM  LOS: 1 day

## 2020-09-23 NOTE — Progress Notes (Signed)
Patient transferred to room 1235, bedside report was given. Patient's son, Arlys John, was called and given an update on patient's status and was also given patient's new room number.

## 2020-09-23 NOTE — Progress Notes (Signed)
Skin assessment completed with Randa Evens, RN. Patient had scabs present on bilateral lower extremities. Patient also has a rash/dermatitis present in abdominal folds

## 2020-09-23 NOTE — Progress Notes (Signed)
PT Cancellation Note  Patient Details Name: Kimberly Ballard MRN: [id number] DOB: 04-02-1940   Cancelled Treatment:    Reason Eval/Treat Not Completed: Order received. Chart reviewed. RN has requested PT hold therapy on today. Will check back another day as schedule allow. Thanks.    Faye Ramsay, PT Acute Rehabilitation  Office: [phone] Pager: [phone]

## 2020-09-23 NOTE — Progress Notes (Signed)
Patient had a critical lab result. Patient's troponin resulted at 128. J.Daniels and A.Doutova, paged via secure chat and made aware.

## 2020-09-23 NOTE — Consult Note (Signed)
Cardiology Consultation:   Patient ID: Kimberly Ballard MRN: [id number]; DOB: 03-09-1940  Admit date: 09/22/2020 Date of Consult: 09/23/2020  PCP:  System, Provider Not In   [facility] Providers Cardiologist: New to Dr. Mayford Knife   Patient Profile:   Kimberly Ballard is a 80 y.o. female with a hx of COPD with prior tobacco smoking, quit in 2019 who is being seen 09/23/2020 for the evaluation of Elevated troponin  at the request of Dr. Adela Glimpse.   History of Present Illness:   Kimberly Ballard treated with Plaxlovid for possible COVID-19 on August 18 by PCP after contact with daughter who had COVID.  However, she presented yesterday with shortness of breath, body aches, cough and decreased appetite.  EMS found her hypoxic.  Oxygenation improved on 6 L of oxygen.  Negative COVID this admission.  She has vaccinated for COVID.  CT angio of chest negative for pulmonary embolism but showed multifocal pneumonia and being admitted.  On steroids for COPD exacerbation. There is evidence of underlying pulmonary artery hypertension.  Right upper quadrant ultrasound with possible cirrhosis.  BNP 889. Hs-troponin 128>>100. Pending echo. Elevated LFTs.   Patient denies prior cardiac history.  Patient is very independent at baseline and lives by herself.  She denies history of exertional chest pain or shortness of breath.  No orthopnea, PND, syncope, melena or blood in the stool or urine.  History of varicose vein with mild intermittent lower extremity edema.   Past Medical History:  Diagnosis Date   COPD (chronic obstructive pulmonary disease) (HCC)     Inpatient Medications: Scheduled Meds:  aspirin EC  81 mg Oral Daily   enoxaparin (LOVENOX) injection  40 mg Subcutaneous Q24H   furosemide  40 mg Intravenous Once   guaiFENesin  600 mg Oral BID   ipratropium-albuterol  3 mL Nebulization Q6H   [START ON 09/24/2020] predniSONE  40 mg Oral Q breakfast   Continuous Infusions:  azithromycin     cefTRIAXone  (ROCEPHIN)  IV     PRN Meds: albuterol  Allergies:   No Known Allergies  Social History:   Social History   Socioeconomic History   Marital status: Divorced    Spouse name: Not on file   Number of children: Not on file   Years of education: Not on file   Highest education level: Not on file  Occupational History   Not on file  Tobacco Use   Smoking status: Former    Types: Cigarettes   Smokeless tobacco: Never  Vaping Use   Vaping Use: Never used  Substance and Sexual Activity   Alcohol use: Yes    Alcohol/week: 2.0 standard drinks    Types: 2 Glasses of wine per week   Drug use: Never   Sexual activity: Not on file  Other Topics Concern   Not on file  Social History Narrative   Not on file   Social Determinants of Health   Financial Resource Strain: Not on file  Food Insecurity: Not on file  Transportation Needs: Not on file  Physical Activity: Not on file  Stress: Not on file  Social Connections: Not on file  Intimate Partner Violence: Not on file    Family History:    Family History  Problem Relation Age of Onset   Diabetes Neg Hx    CAD Neg Hx      ROS:  Please see the history of present illness.  All other ROS reviewed and negative.     Physical  Exam/Data:   Vitals:   09/23/20 0357 09/23/20 0535 09/23/20 0834 09/23/20 0955  BP: 119/71 122/76  136/75  Pulse: 75 71  75  Resp: (!) 26   (!) 26  Temp: 97.8 F (36.6 C)   97.9 F (36.6 C)  TempSrc: Oral   Oral  SpO2: 94% 94% 97% 95%  Weight:      Height:        Intake/Output Summary (Last 24 hours) at 09/23/2020 1128 Last data filed at 09/23/2020 0512 Gross per 24 hour  Intake 593.98 ml  Output --  Net 593.98 ml   Last 3 Weights 09/22/2020  Weight (lbs) 200 lb 6.4 oz  Weight (kg) 90.901 kg     Body mass index is 35.5 kg/m.  General:  Well nourished, well developed, in no acute distress HEENT: normal Lymph: no adenopathy Neck: no JVD Endocrine:  No thryomegaly Vascular: No carotid  bruits; FA pulses 2+ bilaterally without bruits  Cardiac:  normal S1, S2; RRR; no murmur  Lungs: Diminished breath sounds with rhonchi Abd: soft, nontender, no hepatomegaly  Ext: no edema Musculoskeletal:  No deformities, BUE and BLE strength normal and equal Skin: warm and dry  Neuro:  CNs 2-12 intact, no focal abnormalities noted Psych:  Normal affect   EKG:  The EKG was personally reviewed and demonstrates:  SR with TWI in leads V3-V5 Telemetry:  Telemetry was personally reviewed and demonstrates: Sinus rhythm with brief SVT  Relevant CV Studies:  Pending echo   Laboratory Data:  High Sensitivity Troponin:   Recent Labs  Lab 09/22/20 2148 09/23/20 0207  TROPONINIHS 128* 100*     Chemistry Recent Labs  Lab 09/22/20 1441 09/23/20 0207  NA 138 139  K 3.6 3.9  CL 99 102  CO2 28 25  GLUCOSE 125* 141*  BUN 69* 69*  CREATININE 1.28* 1.04*  CALCIUM 8.5* 8.3*  GFRNONAA 43* 55*  ANIONGAP 11 12    Recent Labs  Lab 09/22/20 1441 09/23/20 0207  PROT 7.3 6.8  ALBUMIN 3.3* 3.1*  AST 503* 170*  ALT 439* 327*  ALKPHOS 102 97  BILITOT 1.4* 1.1   Hematology Recent Labs  Lab 09/22/20 1441 09/23/20 0207  WBC 21.6* 19.7*  RBC 4.82 4.91  HGB 15.6* 15.7*  HCT 47.4* 49.4*  MCV 98.3 100.6*  MCH 32.4 32.0  MCHC 32.9 31.8  RDW 14.6 14.7  PLT 283 253   BNP Recent Labs  Lab 09/22/20 2148  BNP 889.7*    DDimer  Recent Labs  Lab 09/22/20 1441  DDIMER 1.97*     Radiology/Studies:  CT Angio Chest PE W/Cm &/Or Wo Cm  Result Date: 09/22/2020 CLINICAL DATA:  Shortness of breath, cough, elevated D-dimer EXAM: CT ANGIOGRAPHY CHEST WITH CONTRAST TECHNIQUE: Multidetector CT imaging of the chest was performed using the standard protocol during bolus administration of intravenous contrast. Multiplanar CT image reconstructions and MIPs were obtained to evaluate the vascular anatomy. CONTRAST:  80mL OMNIPAQUE IOHEXOL 350 MG/ML SOLN COMPARISON:  Same day chest x-ray  FINDINGS: Cardiovascular: Satisfactory opacification of the pulmonary arteries. Evaluation of the distal pulmonary arterial branches within the bilateral lung bases is degraded by respiratory motion artifact. No filling defect is visualized to the segmental branch levels. Main pulmonary trunk is dilated measuring 4.3 cm in diameter suggestive of underlying pulmonary arterial hypertension. Thoracic aorta is nonaneurysmal. Atherosclerotic calcifications of the aorta and coronary arteries. Cardiomegaly. No pericardial effusion. Mediastinum/Nodes: Mildly prominent precarinal lymph node measures 11 mm short axis (series 5,  image 84). Mildly prominent right hilar lymph node measuring approximately 11 mm short axis (series 5, image 110). Mildly prominent left hilar lymph node measuring 10 mm (series 5, image 112) no axillary lymphadenopathy. Thyroid and trachea within normal limits. Small hiatal hernia. No esophageal abnormality is evident. Lungs/Pleura: Dependent consolidations within the bilateral lower lobes and lingula. Additional patchy areas of consolidation predominantly within the dependent regions, some of which have a somewhat nodular configuration including 10 mm nodular opacity in the superior segment of the left lower lobe (series 10, image 51) and 8 mm nodular opacity within the posterior right middle lobe (series 10, image 88). Calcified pleural plaque at the posterior right lung base. No pleural effusion or pneumothorax. Diffuse bronchial wall thickening bilaterally. Upper Abdomen: Reflux of contrast into the IVC and hepatic veins. No acute findings are seen within the visualized upper abdomen. Musculoskeletal: Multilevel thoracic spondylosis with exaggerated thoracic kyphosis. No acute bony findings. No chest wall abnormality. Review of the MIP images confirms the above findings. IMPRESSION: 1. Negative for pulmonary embolism to the segmental branch levels. 2. Dependent consolidations within the bilateral  lower lobes and lingula with additional patchy areas of opacity. Findings are favored to represent multifocal pneumonia. Distribution raises the suspicion for aspiration. 3. Some of the focal areas of airspace opacity have a somewhat nodular configuration. A repeat chest CT in 3 months is recommended to exclude an underlying pulmonary nodule following a trial of antibiotic therapy. 4. Mildly prominent mediastinal and bilateral hilar lymph nodes, likely reactive. 5. Cardiomegaly with evidence of underlying pulmonary arterial hypertension. 6. Reflux of contrast into the IVC and hepatic veins suggesting right heart dysfunction. 7. Small hiatal hernia. Aortic Atherosclerosis (ICD10-I70.0). Electronically Signed   By: Duanne Guess D.O.   On: 09/22/2020 18:01   DG Chest Port 1 View  Result Date: 09/22/2020 CLINICAL DATA:  Shortness of breath and body aches. Hypoxia. Leukocytosis. EXAM: PORTABLE CHEST 1 VIEW COMPARISON:  None. FINDINGS: The cardiac silhouette is enlarged. The hila are also prominent bilaterally which may reflect enlarged pulmonary arteries or lymphadenopathy. Aortic atherosclerosis is noted. The interstitial markings are diffusely increased in both lungs with peribronchial thickening. No sizable pleural effusion or pneumothorax is identified. No acute osseous abnormality is seen. IMPRESSION: 1. Cardiomegaly with bilateral interstitial densities which may reflect infection (including atypical/viral etiologies) versus edema. 2. Bilateral hilar lymphadenopathy versus pulmonary arterial enlargement. Electronically Signed   By: Sebastian Ache M.D.   On: 09/22/2020 15:32   US Abdomen Limited RUQ (LIVER/GB)  Result Date: 09/23/2020 CLINICAL DATA:  80 year old female with abnormal LFTs. EXAM: ULTRASOUND ABDOMEN LIMITED RIGHT UPPER QUADRANT COMPARISON:  CTA chest 09/22/2020. FINDINGS: Gallbladder: Absent as seen on the CTA yesterday. Common bile duct: Diameter: 3 mm, normal. Liver: Echogenic liver (images  14, 18). No discrete liver lesion. Portal vein is patent on color Doppler imaging with normal direction of blood flow towards the liver. Mildly nodular liver contour on some images such (images 29 and 35). Other: Visible right kidney is within normal limits.  No free fluid. IMPRESSION: 1. Hepatic steatosis, with mildly nodular liver contour raising the possibility of cirrhosis. 2. Absent gallbladder with no evidence of bile duct obstruction. Electronically Signed   By: Odessa Fleming M.D.   On: 09/23/2020 06:45     Assessment and Plan:   Elevated troponin - Hs-troponin 128>>100.  EKG with T wave inversion in lead V3 to V5.  No prior EKG to compare.  Patient denies chest pain.  Certainly, she has  cardiac risk factors given long history of tobacco smoking and evidence of coronary calcification on CT angio of chest. -Fortunately patient is asymptomatic.  Pending echocardiogram to assess LV function and wall motion abnormality.  She may need ischemic evaluation inpatient versus outpatient based on echo. -Current troponin elevation likely demand in setting of multifocal pneumonia -Continue aspirin 81 mg daily -Hold adding statin given elevated LFTs -No indications for heparin   2.  Cardiomegaly with concern for CHF and cirrhosis -BNP 889.  CTA of chest with cardiomegaly underlying pulmonary hypertension. -Pending echocardiogram -Agree with 1 dose of IV Lasix 40 mg  3.  Acute hypoxic respiratory failure secondary to multifocal pneumonia 4. Elevated LFTs 5. COPD Exacerbation -Per primary team  Risk Assessment/Risk Scores:   TIMI Risk Score for Unstable Angina or Non-ST Elevation MI:   The patient's TIMI risk score is 2, which indicates a 8% risk of all cause mortality, new or recurrent myocardial infarction or need for urgent revascularization in the next 14 days.{   For questions or updates, please contact [facility] Please consult [email or web address] for contact info under    Kimberly Pont, PA  09/23/2020 11:28 AM

## 2020-09-23 NOTE — Consult Note (Signed)
NAME:  Kimberly Ballard, MRN:  [id number], DOB:  07-08-1940, LOS: 1 ADMISSION DATE:  09/22/2020, CONSULTATION DATE:  09/23/20 REFERRING MD:  Adela Glimpse CHIEF COMPLAINT: Dyspnea, cough   History of Present Illness:  Kimberly Ballard is a 80 y.o. female who presented to [facility] ED on 8/22 with dyspnea, body aches, cough.  Her daughter had apparently tested positive for COVID 1 week earlier.  She saw her PCP and was empirically started on Paxlovid due to her exposure of which she took 2 doses.  She is not vaccinated for COVID. Symptoms progressed to the point that she felt she needed to seek medical attention; therefore, she called EMS.  On EMS arrival, she was found to be hypoxic in the 70s she was subsequently placed on 6 L of oxygen with improvement in sats to 94%.  In ED, she was found to have leukocytosis, hypoxic and hypercapnic respiratory failure, and probable community-acquired pneumonia on imaging.  She was admitted by [facility] and on 8/23, PCCM asked to see in consultation.  She is a former smoker.  Smoked around 1ppd and quit some 5 - 6 years ago.  She has never seen a pulmonologist and / or had PFT's performed.  Pertinent  Medical History:  has CAP (community acquired pneumonia); Acute respiratory failure with hypoxia and hypercapnia (HCC); Sepsis (HCC); Elevated LFTs; Cardiomegaly; and Elevated troponin on their problem list.  Significant Hospital Events: Including procedures, antibiotic start and stop dates in addition to other pertinent events   8/22 > admit.  Interim History / Subjective:  On 6L O2 with sats 90s. Feels weak.  Has cough but not able to produce much sputum though feels as if she has sputum to bring up.  Objective:  Blood pressure 136/75, pulse 75, temperature 97.9 F (36.6 C), temperature source Oral, resp. rate (!) 26, height 5\' 3"  (1.6 m), weight 90.9 kg, SpO2 95 %.        Intake/Output Summary (Last 24 hours) at 09/23/2020 1100 Last data filed at 09/23/2020 09/25/2020 Gross  per 24 hour  Intake 593.98 ml  Output --  Net 593.98 ml   Filed Weights   09/22/20 2020  Weight: 90.9 kg    Examination: General: Adult female, weak, in NAD. Neuro: A&O x 3, no deficits. HEENT: [place]/AT. Sclerae anicteric. EOMI. Cardiovascular: RRR, no M/R/G.  Lungs: Respirations even and unlabored.  CTA bilaterally, No W/R/R. Abdomen: BS x 4, soft, NT/ND.  Musculoskeletal: No gross deformities, no edema.  Skin: Intact, warm, no rashes.  Labs/imaging personally reviewed:  CTA chest 8/22 > multifocal PNA, probable reactive bilateral hilar lymph nodes, cardiomegaly, PAH, possible nodule.  Assessment & Plan:   Multifocal CAP with possible aspiration (POA). - Continue CAP coverage. - Follow cultures. - Continue Guaifenesin, IS. - Add flutter valve. - SLP eval to rule out dysphagia given basilar predominance on imaging.  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 - Given her compensated pH, no role for BiPAP unless concern for worsening hypercapnia (ie confusion etc). - Recommend outpatient follow up in our office with PFT's once over our acute issues. Will send our office a message to arrange for this.  Possible PAH. - F/u on echo results then can f/u in our office for this as well.  RML nodule? - Repeat CT in 3 months after treatment for PNA.   Rest per primary team.  Will send telephone message  and arrange for follow up in our office along with PFT's.  Nothing else further at this point from our standpoint.  Will sign off - pease call us back if we can be of any further assistance.   Best practice (evaluated daily):  Per primary team.  Labs   CBC: Recent Labs  Lab 09/22/20 1441 09/23/20 0207  WBC 21.6* 19.7*  NEUTROABS 16.7* 16.9*  HGB 15.6* 15.7*  HCT 47.4* 49.4*  MCV 98.3 100.6*  PLT 283 253    Basic Metabolic Panel: Recent Labs  Lab  09/22/20 1441 09/22/20 1500 09/23/20 0207  NA 138  --  139  K 3.6  --  3.9  CL 99  --  102  CO2 28  --  25  GLUCOSE 125*  --  141*  BUN 69*  --  69*  CREATININE 1.28*  --  1.04*  CALCIUM 8.5*  --  8.3*  MG  --  2.6* 2.7*  PHOS  --  3.8 3.5   GFR: Estimated Creatinine Clearance: 46.9 mL/min (A) (by C-G formula based on SCr of 1.04 mg/dL (H)). Recent Labs  Lab 09/22/20 1441 09/23/20 0207  PROCALCITON 0.48 0.49  WBC 21.6* 19.7*  LATICACIDVEN 1.7  --     Liver Function Tests: Recent Labs  Lab 09/22/20 1441 09/23/20 0207  AST 503* 170*  ALT 439* 327*  ALKPHOS 102 97  BILITOT 1.4* 1.1  PROT 7.3 6.8  ALBUMIN 3.3* 3.1*   No results for input(s): LIPASE, AMYLASE in the last 168 hours. No results for input(s): AMMONIA in the last 168 hours.  ABG    Component Value Date/Time   PHART 7.318 (L) 09/23/2020 0312   PCO2ART 58.5 (H) 09/23/2020 0312   PO2ART 68.4 (L) 09/23/2020 0312   HCO3 29.4 (H) 09/23/2020 0312   O2SAT 91.0 09/23/2020 0312     Coagulation Profile: Recent Labs  Lab 09/22/20 2050  INR 1.3*    Cardiac Enzymes: Recent Labs  Lab 09/22/20 2148  CKTOTAL 60    HbA1C: No results found for: HGBA1C  CBG: No results for input(s): GLUCAP in the last 168 hours.  Review of Systems:   All negative; except for those that are bolded, which indicate positives.  Constitutional: weight loss, weight gain, night sweats, fevers, chills, fatigue, weakness.  HEENT: headaches, sore throat, sneezing, nasal congestion, post nasal drip, difficulty swallowing, tooth/dental problems, visual complaints, visual changes, ear aches. Neuro: difficulty with speech, weakness, numbness, ataxia. CV:  chest pain, orthopnea, PND, swelling in lower extremities, dizziness, palpitations, syncope.  Resp: cough, hemoptysis, dyspnea, wheezing. GI: heartburn, indigestion, abdominal pain, nausea, vomiting, diarrhea, constipation, change in bowel habits, loss of appetite, hematemesis,  melena, hematochezia.  GU: dysuria, change in color of urine, urgency or frequency, flank pain, hematuria. MSK: joint pain or swelling, decreased range of motion. Psych: change in mood or affect, depression, anxiety, suicidal ideations, homicidal ideations. Skin: rash, itching, bruising.   Past Medical History:  She,  has a past medical history of COPD (chronic obstructive pulmonary disease) (HCC).   Surgical History:  History reviewed. No pertinent surgical history.   Social History:   reports that she has quit smoking. Her smoking use included cigarettes. She has never used smokeless tobacco. She reports current alcohol use of about 2.0 standard drinks per week. She reports that she does not use drugs.   Family History:  Her family history is negative for Diabetes and CAD.   Allergies No Known Allergies   Home Medications  Prior to Admission medications   Medication Sig Start Date End Date Taking? Authorizing Provider  aspirin 81 MG EC tablet Take 81 mg by mouth daily.   Yes [provider]  PAXLOVID, 300/100, 20 x 150 MG & 10 x 100MG  TBPK Take 3 tablets by mouth 2 (two) times daily. 09/18/20  Yes [provider]  TRELEGY ELLIPTA 100-62.5-25 MCG/INH AEPB Inhale 1 puff into the lungs daily. 08/26/20  Yes [provider]      08/28/20, PA - [facility] For pager details, please see AMION or use Epic chat  After 1900, please call [facility] for cross coverage needs 09/23/2020, 11:00 AM

## 2020-09-24 ENCOUNTER — Inpatient Hospital Stay (HOSPITAL_COMMUNITY): Payer: Medicare HMO

## 2020-09-24 ENCOUNTER — Encounter (HOSPITAL_COMMUNITY): Payer: Self-pay | Admitting: Internal Medicine

## 2020-09-24 DIAGNOSIS — R7989 Other specified abnormal findings of blood chemistry: Secondary | ICD-10-CM | POA: Diagnosis not present

## 2020-09-24 DIAGNOSIS — R778 Other specified abnormalities of plasma proteins: Secondary | ICD-10-CM | POA: Diagnosis not present

## 2020-09-24 DIAGNOSIS — R609 Edema, unspecified: Secondary | ICD-10-CM

## 2020-09-24 DIAGNOSIS — I4891 Unspecified atrial fibrillation: Secondary | ICD-10-CM

## 2020-09-24 DIAGNOSIS — J189 Pneumonia, unspecified organism: Secondary | ICD-10-CM

## 2020-09-24 DIAGNOSIS — I517 Cardiomegaly: Secondary | ICD-10-CM | POA: Diagnosis not present

## 2020-09-24 LAB — COMPREHENSIVE METABOLIC PANEL
ALT: 264 U/L — ABNORMAL HIGH (ref 0–44)
AST: 91 U/L — ABNORMAL HIGH (ref 15–41)
Albumin: 2.9 g/dL — ABNORMAL LOW (ref 3.5–5.0)
Alkaline Phosphatase: 112 U/L (ref 38–126)
Anion gap: 12 (ref 5–15)
BUN: 58 mg/dL — ABNORMAL HIGH (ref 8–23)
CO2: 30 mmol/L (ref 22–32)
Calcium: 8.3 mg/dL — ABNORMAL LOW (ref 8.9–10.3)
Chloride: 99 mmol/L (ref 98–111)
Creatinine, Ser: 0.75 mg/dL (ref 0.44–1.00)
GFR, Estimated: >60 mL/min (ref >60)
Glucose, Bld: 187 mg/dL — ABNORMAL HIGH (ref 70–99)
Potassium: 3.6 mmol/L (ref 3.5–5.1)
Sodium: 141 mmol/L (ref 135–145)
Total Bilirubin: 0.8 mg/dL (ref 0.3–1.2)
Total Protein: 7 g/dL (ref 6.5–8.1)

## 2020-09-24 LAB — CBC
HCT: 49.4 % — ABNORMAL HIGH (ref 36.0–46.0)
Hemoglobin: 15.4 g/dL — ABNORMAL HIGH (ref 12.0–15.0)
MCH: 31.4 pg (ref 26.0–34.0)
MCHC: 31.2 g/dL (ref 30.0–36.0)
MCV: 100.8 fL — ABNORMAL HIGH (ref 80.0–100.0)
Platelets: 318 10*3/uL (ref 150–400)
RBC: 4.9 MIL/uL (ref 3.87–5.11)
RDW: 14.5 % (ref 11.5–15.5)
WBC: 16.5 10*3/uL — ABNORMAL HIGH (ref 4.0–10.5)
nRBC: 0.4 % — ABNORMAL HIGH (ref 0.0–0.2)

## 2020-09-24 LAB — RESP PANEL BY RT-PCR (FLU A&B, COVID) ARPGX2
Influenza A by PCR: NEGATIVE
Influenza B by PCR: NEGATIVE
SARS Coronavirus 2 by RT PCR: NEGATIVE

## 2020-09-24 LAB — URINALYSIS, ROUTINE W REFLEX MICROSCOPIC
Bilirubin Urine: NEGATIVE
Glucose, UA: NEGATIVE mg/dL
Hgb urine dipstick: NEGATIVE
Ketones, ur: NEGATIVE mg/dL
Leukocytes,Ua: NEGATIVE
Nitrite: NEGATIVE
Protein, ur: NEGATIVE mg/dL
Specific Gravity, Urine: 1.013 (ref 1.005–1.030)
pH: 6 (ref 5.0–8.0)

## 2020-09-24 LAB — STREP PNEUMONIAE URINARY ANTIGEN: Strep Pneumo Urinary Antigen: NEGATIVE

## 2020-09-24 LAB — TROPONIN I (HIGH SENSITIVITY): Troponin I (High Sensitivity): 52 ng/L — ABNORMAL HIGH (ref <18)

## 2020-09-24 LAB — MAGNESIUM: Magnesium: 2.8 mg/dL — ABNORMAL HIGH (ref 1.7–2.4)

## 2020-09-24 LAB — SAR COV2 SEROLOGY (COVID19)AB(IGG),IA: SARS-CoV-2 Ab, IgG: NONREACTIVE

## 2020-09-24 LAB — PROCALCITONIN: Procalcitonin: 0.23 ng/mL

## 2020-09-24 LAB — HEPARIN LEVEL (UNFRACTIONATED): Heparin Unfractionated: 0.1 IU/mL — ABNORMAL LOW (ref 0.30–0.70)

## 2020-09-24 LAB — C-REACTIVE PROTEIN: CRP: 28 mg/dL — ABNORMAL HIGH (ref <1.0)

## 2020-09-24 LAB — MRSA NEXT GEN BY PCR, NASAL: MRSA by PCR Next Gen: NOT DETECTED

## 2020-09-24 MED ORDER — FUROSEMIDE 10 MG/ML IJ SOLN
40.0000 mg | Freq: Once | INTRAMUSCULAR | Status: AC
  Administered 2020-09-24: 40 mg via INTRAVENOUS
  Filled 2020-09-24: qty 4

## 2020-09-24 MED ORDER — LACTATED RINGERS IV BOLUS
500.0000 mL | Freq: Once | INTRAVENOUS | Status: AC
  Administered 2020-09-24: 500 mL via INTRAVENOUS

## 2020-09-24 MED ORDER — AMIODARONE IV BOLUS ONLY 150 MG/100ML
150.0000 mg | Freq: Once | INTRAVENOUS | Status: AC
  Administered 2020-09-24: 150 mg via INTRAVENOUS
  Filled 2020-09-24: qty 100

## 2020-09-24 MED ORDER — IOHEXOL 350 MG/ML SOLN
80.0000 mL | Freq: Once | INTRAVENOUS | Status: AC | PRN
  Administered 2020-09-24: 80 mL via INTRAVENOUS

## 2020-09-24 MED ORDER — DEXTROSE IN LACTATED RINGERS 5 % IV SOLN: INTRAVENOUS | Status: DC

## 2020-09-24 MED ORDER — POTASSIUM CHLORIDE 10 MEQ/100ML IV SOLN
10.0000 meq | INTRAVENOUS | Status: AC
  Administered 2020-09-24 (×4): 10 meq via INTRAVENOUS
  Filled 2020-09-24 (×4): qty 100

## 2020-09-24 MED ORDER — DILTIAZEM LOAD VIA INFUSION
15.0000 mg | Freq: Once | INTRAVENOUS | Status: AC
  Administered 2020-09-24: 15 mg via INTRAVENOUS
  Filled 2020-09-24: qty 15

## 2020-09-24 MED ORDER — HEPARIN BOLUS VIA INFUSION
2000.0000 [IU] | Freq: Once | INTRAVENOUS | Status: AC
  Administered 2020-09-24: 2000 [IU] via INTRAVENOUS
  Filled 2020-09-24: qty 2000

## 2020-09-24 MED ORDER — HEPARIN (PORCINE) 25000 UT/250ML-% IV SOLN
1300.0000 [IU]/h | INTRAVENOUS | Status: DC
  Administered 2020-09-24: 1100 [IU]/h via INTRAVENOUS
  Administered 2020-09-25: 1300 [IU]/h via INTRAVENOUS
  Filled 2020-09-24 (×2): qty 250

## 2020-09-24 MED ORDER — DILTIAZEM HCL-DEXTROSE 125-5 MG/125ML-% IV SOLN (PREMIX)
5.0000 mg/h | INTRAVENOUS | Status: DC
  Administered 2020-09-24: 5 mg/h via INTRAVENOUS
  Administered 2020-09-24: 10 mg/h via INTRAVENOUS
  Filled 2020-09-24 (×2): qty 125

## 2020-09-24 MED ORDER — LACTATED RINGERS IV BOLUS: 500.0000 mL | Freq: Once | INTRAVENOUS | Status: DC

## 2020-09-24 MED ORDER — DILTIAZEM HCL 25 MG/5ML IV SOLN
5.0000 mg | Freq: Once | INTRAVENOUS | Status: AC
  Administered 2020-09-24: 5 mg via INTRAVENOUS
  Filled 2020-09-24: qty 5

## 2020-09-24 NOTE — Progress Notes (Signed)
Pharmacy: [facility]  Patient's a 80 y.o F currently on heparin drip for new-onset afib.  First heparin level is sub-therapeutic at  0.10 with drip infusing at 1100 units/hr. Per pt's RN, no issues with IV line and no bleeding noted.  Goal of Therapy:  Heparin level 0.3-0.7 units/ml Monitor platelets by anticoagulation protocol: Yes  Plan: - heparin 2000 units IV x1 bolus, then increase drip to 1300 units/hr - check 8 hr heparin level - monitor for s/sx bleeding  Dorna Leitz, PharmD, BCPS 09/24/2020 9:05 PM

## 2020-09-24 NOTE — Progress Notes (Signed)
OT Cancellation Note  Patient Details Name: Kimberly Ballard MRN: [id number] DOB: [birth year]   Cancelled Treatment:    Reason Eval/Treat Not Completed: Medical issues which prohibited therapy patient is currently on BiPAP. Will continue to follow.   Sharyn Blitz OTR/L, MS Acute Rehabilitation Department Office# [phone] Pager# [phone]   Chalmers Guest Tyshaun Vinzant 09/24/2020, 7:54 AM

## 2020-09-24 NOTE — Progress Notes (Signed)
SLP Cancellation Note  Patient Details Name: Kimberly Ballard MRN: [id number] DOB: 19-Sep-1940  Cancel: Pt on Bipap at noon, will continue efforts.    [name], MS [facility] SLP Acute Rehab Services Office [phone] Pager [phone]

## 2020-09-24 NOTE — Progress Notes (Signed)
eLink Physician-Brief Progress Note Patient Name: OANH DEVIVO DOB: 1940/07/30 MRN: [id number]   Date of Service  09/24/2020  HPI/Events of Note  Patient in Afib with RVR, heart rate 115-137, EKG shows inferior abnormalities r/o ischemia, troponin # 2  is pending, D-Dimer 2.04, ABG with hypoxemia but not much changed from baseline ABG.  eICU Interventions  CTA lungs ordered to r/o PE, cardiac enzymes being cycled, FIO2 increased to 60 %, , Amiodarone 150 mg iv x 1 to try to terminate Afib (QTc 457).        Thomasene Lot Brinlynn Gorton 09/24/2020, 2:44 AM

## 2020-09-24 NOTE — Progress Notes (Signed)
K+ 3.6 Replaced per protocol  

## 2020-09-24 NOTE — Progress Notes (Addendum)
Rt took pt off BIPAP and placed on 15 LPM HF. Pt has been on BIPAP since this am. No distress noted at this time.

## 2020-09-24 NOTE — Progress Notes (Signed)
Bilateral lower extremity venous duplex has been completed. Preliminary results can be found in CV Proc through chart review.   09/24/20 10:11 AM Olen Cordial RVT

## 2020-09-24 NOTE — Progress Notes (Signed)
Rt placed pt on BIPAP for her 2 hours on per order.

## 2020-09-24 NOTE — Progress Notes (Signed)
ANTICOAGULATION CONSULT NOTE - Initial Consult  Pharmacy Consult for Heparin Indication: atrial fibrillation  No Known Allergies  Patient Measurements: Height: 5\' 3"  (160 cm) Weight: 90.9 kg (200 lb 6.4 oz) IBW/kg (Calculated) : 52.4 Heparin Dosing Weight: 73 kg  Vital Signs: Temp: 97 F (36.1 C) (08/24 0818) Temp Source: Axillary (08/24 0818) BP: 117/93 (08/24 1100) Pulse Rate: 132 (08/24 1100)  Labs: Recent Labs    09/22/20 1441 09/22/20 2050 09/22/20 2148 09/23/20 0207 09/24/20 0239 09/24/20 0323  HGB 15.6*  --   --  15.7*  --  15.4*  HCT 47.4*  --   --  49.4*  --  49.4*  PLT 283  --   --  253  --  318  LABPROT  --  16.0*  --   --   --   --   INR  --  1.3*  --   --   --   --   CREATININE 1.28*  --   --  1.04*  --  0.75  CKTOTAL  --   --  60  --   --   --   TROPONINIHS  --   --  128* 100* 52*  --     Estimated Creatinine Clearance: 61 mL/min (by C-G formula based on SCr of 0.75 mg/dL).   Medical History: Past Medical History:  Diagnosis Date   COPD (chronic obstructive pulmonary disease) (HCC)    Assessment: [name] admitted for acute respiratory failure currently being treated for PNA and acute CHF with new-onset atrial fibrillation with rapid ventricular response. Pharmacy consulted to manage heparin infusion. Last dose of Lovenox last PM.   Goal of Therapy:  Heparin level 0.3-0.7 units/ml Monitor platelets by anticoagulation protocol: Yes   Plan:  Give 2000 units bolus x 1 Start heparin infusion at 1100 units/hr Check anti-Xa level in 8 hours and daily while on heparin Continue to monitor H&H and platelets  [name] 09/24/2020,11:26 AM

## 2020-09-24 NOTE — Progress Notes (Signed)
Patient removed from BIPAP and placed on 15L HFNC. O2 93% at this time

## 2020-09-24 NOTE — Progress Notes (Signed)
NAME:  Kimberly Ballard, MRN:  [id number], DOB:  09-16-1940, LOS: 2 ADMISSION DATE:  09/22/2020, CONSULTATION DATE:  09/23/20 REFERRING MD:  Adela Glimpse CHIEF COMPLAINT: Dyspnea, cough   History of Present Illness:  Kimberly Ballard is a 80 y.o. female who presented to [facility] ED on 8/22 with dyspnea, body aches, cough.  Her daughter had apparently tested positive for COVID 1 week earlier.  She saw her PCP and was empirically started on Paxlovid due to her exposure of which she took 2 doses.  She is not vaccinated for COVID. Symptoms progressed to the point that she felt she needed to seek medical attention; therefore, she called EMS. -On EMS arrival, she was found to be hypoxic in the 70s she was subsequently placed on 6 L of oxygen with improvement in sats to 94%. -In ED, she was found to have leukocytosis, hypoxic and hypercapnic respiratory failure, and probable community-acquired pneumonia on imaging. -She was admitted by [facility] and on 8/23, PCCM asked to see in consultation.   Pertinent  Medical History:  has CAP (community acquired pneumonia); Acute respiratory failure with hypoxia and hypercapnia (HCC); Sepsis (HCC); Elevated LFTs; Cardiomegaly; and Elevated troponin on their problem list.  She is a former smoker.  Smoked around 1ppd and quit some 5 - 6 years ago.  She has never seen a pulmonologist and / or had PFT's performed.  Significant Hospital Events: Including procedures, antibiotic start and stop dates in addition to other pertinent events   8/22 > admit.  Admitted to stepdown unit.  Cough weak.  Pulmonary asked to evaluate given concern for progressive respiratory failure.  Initial COVID by respiratory panel was negative.  As was influenza.  Respiratory viral panel negative.  Still placed on isolation protocol given history.  IV ceftriaxone and azithromycin initiated.  Given IV Solu-Medrol followed by oral prednisone order. 8/23 pulmonary asked to evaluate given worsening respiratory failure.  CT  chest showing lower lobe consolidation.  There was concern about possible aspiration.  Echo obtained showed good left ventricular function with mild pulmonary artery hypertension. 8/24 cardiology consulted for elevated troponins.  This was felt secondary to worsening hypoxia and underlying pulmonary artery hypertension which was felt previously undiagnosed issue.  New onset atrial fibrillation with RVR.  CT angiogram ordered.  Increased oxygen requirement overnight, requiring BiPAP, later placed on 15 L/min via high flow.  Lower extremity ultrasound negative for DVT   Interim History / Subjective:  Currently resting comfortable but is on high flow oxygen at 15 L no accessory use on these settings  Objective:  Blood pressure (Abnormal) 117/93, pulse (Abnormal) 132, temperature (Abnormal) 97 F (36.1 C), temperature source Axillary, resp. rate (Abnormal) 26, height 5\' 3"  (1.6 m), weight 90.9 kg, SpO2 95 %.    FiO2 (%):  [60 %] 60 %   Intake/Output Summary (Last 24 hours) at 09/24/2020 1152 Last data filed at 09/23/2020 2000 Gross per 24 hour  Intake 75.62 ml  Output 1 ml  Net 74.62 ml   Filed Weights   09/22/20 2020  Weight: 90.9 kg    Examination:  General this is a pleasant 80 year old female she is resting comfortably in bed she is in no acute distress currently on 15 L high flow HEENT normocephalic atraumatic no jugular venous distention appreciated mucous membranes are moist Cardiac: Tachycardic, currently atrial fibrillation with RVR, no murmur rub or gallop appreciated Pulmonary: Diminished in the bases.  No accessory use currently on her current level of oxygen Abdomen: Soft  not tender no organomegaly positive bowel sounds Extremities: Warm dry dependent edema is appreciated brisk capillary refill Neuro: Awake oriented no focal deficits appreciated. GU: Clear yellow urine. Labs/imaging personally reviewed:  CTA chest 8/22 > multifocal PNA, probable reactive bilateral hilar  lymph nodes, cardiomegaly, PAH, possible nodule.  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 -Did have recent COVID exposure, current respiratory viral panel negative, as was initial COVID testing however certainly high enough risk of false negative -Oxygen requirements increased.  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 -Seen by cardiology felt likely acute on chronic with acute hypoxia worsening current picture -Troponins are trending down Plan Continuing IV heparin CT angiogram  Atrial fibrillation with rapid ventricular response Seen by cardiology, CHA2DS2-VASc score 3 Plan IV Cardizem Trying to avoid amiodarone IV anticoagulation  Steroid-induced hyperglycemia Plan Sliding scale insulin  RML nodule Plan Repeat imaging in 3 months with CAT scan    Best practice (evaluated daily):  Per primary team.  My cct 34 min  Simonne Martinet ACNP-BC [facility] Pulmonary/Critical Care Pager # [phone] OR # [phone] if no answer

## 2020-09-24 NOTE — Progress Notes (Signed)
PT Cancellation Note  Patient Details Name: Kimberly Ballard MRN: [id number] DOB: April 01, 1940   Cancelled Treatment:    Reason Eval/Treat Not Completed: Medical issues which prohibited therapy, currently on BiPAP and waiting to convert to sinus rhythm. Will check baclk later today.    [name] 09/24/2020, 7:44 AM Kimberly Ballard PT Acute Rehabilitation Services Pager [phone] Office [phone]

## 2020-09-24 NOTE — Progress Notes (Signed)
PT Cancellation Note  Patient Details Name: Kimberly Ballard MRN: [id number] DOB: 1940/11/16   Cancelled Treatment:    Reason Eval/Treat Not Completed: Patient not medically ready   [name] 09/24/2020, 12:52 PM Blanchard Kelch PT Acute Rehabilitation Services Pager [phone] Office [phone]

## 2020-09-24 NOTE — Progress Notes (Signed)
Progress Note  Patient Name: Kimberly Ballard Date of Encounter: 09/24/2020  [facility] Cardiologist: None   Subjective   Patient went into atrial fibrillation with RVR and currently HR in the 110's.  Worsening respiratory distress and FIO2 increased to 60%.  Given 150mg  bolus of IV Amio and started on IV Cardizem gtt. hsTrop down to 52 from 100.    Inpatient Medications    Scheduled Meds:  aspirin EC  81 mg Oral Daily   Chlorhexidine Gluconate Cloth  6 each Topical Daily   enoxaparin (LOVENOX) injection  40 mg Subcutaneous Q24H   guaiFENesin  600 mg Oral BID   ipratropium-albuterol  3 mL Nebulization Q6H   predniSONE  40 mg Oral Q breakfast   Continuous Infusions:  azithromycin 500 mg (09/23/20 1727)   cefTRIAXone (ROCEPHIN)  IV 2 g (09/23/20 1842)   PRN Meds: albuterol   Vital Signs    Vitals:   09/24/20 0400 09/24/20 0500 09/24/20 0600 09/24/20 0818  BP: 119/81 125/73 115/78   Pulse: (!) 147 (!) 109 94 98  Resp: (!) 26 (!) 25 (!) 27 (!) 22  Temp:    (!) 97 F (36.1 C)  TempSrc:    Axillary  SpO2: (!) 87% 94% (!) 85% 94%  Weight:      Height:        Intake/Output Summary (Last 24 hours) at 09/24/2020 1043 Last data filed at 09/23/2020 2000 Gross per 24 hour  Intake 75.62 ml  Output 1 ml  Net 74.62 ml   Last 3 Weights 09/22/2020  Weight (lbs) 200 lb 6.4 oz  Weight (kg) 90.901 kg      Telemetry    Atrial fibrillation with RVR - Personally Reviewed  ECG    Atrial fibrillation with RVR and nonspecific T wave abnormality - Personally Reviewed  Physical Exam   GEN: No acute distress.   Neck: No JVD Cardiac: irregularly irregular and tachy, no murmurs, rubs, or gallops.  Respiratory: Clear to auscultation bilaterally. GI: Soft, nontender, non-distended  MS: No edema; No deformity. Neuro:  Nonfocal  Psych: Normal affect   Labs    High Sensitivity Troponin:   Recent Labs  Lab 09/22/20 2148 09/23/20 0207 09/24/20 0239  TROPONINIHS 128* 100*  52*      Chemistry Recent Labs  Lab 09/22/20 1441 09/23/20 0207 09/24/20 0323  NA 138 139 141  K 3.6 3.9 3.6  CL 99 102 99  CO2 28 25 30   GLUCOSE 125* 141* 187*  BUN 69* 69* 58*  CREATININE 1.28* 1.04* 0.75  CALCIUM 8.5* 8.3* 8.3*  PROT 7.3 6.8 7.0  ALBUMIN 3.3* 3.1* 2.9*  AST 503* 170* 91*  ALT 439* 327* 264*  ALKPHOS 102 97 112  BILITOT 1.4* 1.1 0.8  GFRNONAA 43* 55* >60  ANIONGAP 11 12 12      Hematology Recent Labs  Lab 09/22/20 1441 09/23/20 0207 09/24/20 0323  WBC 21.6* 19.7* 16.5*  RBC 4.82 4.91 4.90  HGB 15.6* 15.7* 15.4*  HCT 47.4* 49.4* 49.4*  MCV 98.3 100.6* 100.8*  MCH 32.4 32.0 31.4  MCHC 32.9 31.8 31.2  RDW 14.6 14.7 14.5  PLT 283 253 318    BNP Recent Labs  Lab 09/22/20 2148  BNP 889.7*     DDimer  Recent Labs  Lab 09/22/20 1441 09/23/20 1844  DDIMER 1.97* 2.04*     CHA2DS2-VASc Score = 3  This indicates a 3.2% annual risk of stroke. The patient's score is based upon: CHF History:  No HTN History: No Diabetes History: No Stroke History: No Vascular Disease History: No Age Score: 2 Gender Score: 1   Radiology    CT Angio Chest PE W/Cm &/Or Wo Cm  Result Date: 09/22/2020 CLINICAL DATA:  Shortness of breath, cough, elevated D-dimer EXAM: CT ANGIOGRAPHY CHEST WITH CONTRAST TECHNIQUE: Multidetector CT imaging of the chest was performed using the standard protocol during bolus administration of intravenous contrast. Multiplanar CT image reconstructions and MIPs were obtained to evaluate the vascular anatomy. CONTRAST:  65mL OMNIPAQUE IOHEXOL 350 MG/ML SOLN COMPARISON:  Same day chest x-ray FINDINGS: Cardiovascular: Satisfactory opacification of the pulmonary arteries. Evaluation of the distal pulmonary arterial branches within the bilateral lung bases is degraded by respiratory motion artifact. No filling defect is visualized to the segmental branch levels. Main pulmonary trunk is dilated measuring 4.3 cm in diameter suggestive of  underlying pulmonary arterial hypertension. Thoracic aorta is nonaneurysmal. Atherosclerotic calcifications of the aorta and coronary arteries. Cardiomegaly. No pericardial effusion. Mediastinum/Nodes: Mildly prominent precarinal lymph node measures 11 mm short axis (series 5, image 84). Mildly prominent right hilar lymph node measuring approximately 11 mm short axis (series 5, image 110). Mildly prominent left hilar lymph node measuring 10 mm (series 5, image 112) no axillary lymphadenopathy. Thyroid and trachea within normal limits. Small hiatal hernia. No esophageal abnormality is evident. Lungs/Pleura: Dependent consolidations within the bilateral lower lobes and lingula. Additional patchy areas of consolidation predominantly within the dependent regions, some of which have a somewhat nodular configuration including 10 mm nodular opacity in the superior segment of the left lower lobe (series 10, image 51) and 8 mm nodular opacity within the posterior right middle lobe (series 10, image 88). Calcified pleural plaque at the posterior right lung base. No pleural effusion or pneumothorax. Diffuse bronchial wall thickening bilaterally. Upper Abdomen: Reflux of contrast into the IVC and hepatic veins. No acute findings are seen within the visualized upper abdomen. Musculoskeletal: Multilevel thoracic spondylosis with exaggerated thoracic kyphosis. No acute bony findings. No chest wall abnormality. Review of the MIP images confirms the above findings. IMPRESSION: 1. Negative for pulmonary embolism to the segmental branch levels. 2. Dependent consolidations within the bilateral lower lobes and lingula with additional patchy areas of opacity. Findings are favored to represent multifocal pneumonia. Distribution raises the suspicion for aspiration. 3. Some of the focal areas of airspace opacity have a somewhat nodular configuration. A repeat chest CT in 3 months is recommended to exclude an underlying pulmonary nodule  following a trial of antibiotic therapy. 4. Mildly prominent mediastinal and bilateral hilar lymph nodes, likely reactive. 5. Cardiomegaly with evidence of underlying pulmonary arterial hypertension. 6. Reflux of contrast into the IVC and hepatic veins suggesting right heart dysfunction. 7. Small hiatal hernia. Aortic Atherosclerosis (ICD10-I70.0). Electronically Signed   By: Duanne Guess D.O.   On: 09/22/2020 18:01   DG Chest Port 1 View  Result Date: 09/22/2020 CLINICAL DATA:  Shortness of breath and body aches. Hypoxia. Leukocytosis. EXAM: PORTABLE CHEST 1 VIEW COMPARISON:  None. FINDINGS: The cardiac silhouette is enlarged. The hila are also prominent bilaterally which may reflect enlarged pulmonary arteries or lymphadenopathy. Aortic atherosclerosis is noted. The interstitial markings are diffusely increased in both lungs with peribronchial thickening. No sizable pleural effusion or pneumothorax is identified. No acute osseous abnormality is seen. IMPRESSION: 1. Cardiomegaly with bilateral interstitial densities which may reflect infection (including atypical/viral etiologies) versus edema. 2. Bilateral hilar lymphadenopathy versus pulmonary arterial enlargement. Electronically Signed   By: Jolaine Click.D.  On: 09/22/2020 15:32   ECHOCARDIOGRAM COMPLETE  Result Date: 09/23/2020    ECHOCARDIOGRAM REPORT   Patient Name:   Kimberly Ballard Date of Exam: 09/23/2020 Medical Rec #:  [id number]     Height:       63.0 in Accession #:    [id number]    Weight:       200.4 lb Date of Birth:  22-Mar-1940    BSA:          1.935 m Patient Age:    79 years      BP:           122/76 mmHg Patient Gender: F             HR:           76 bpm. Exam Location:  Inpatient Procedure: 2D Echo, 3D Echo, Cardiac Doppler and Color Doppler Indications:    I51.7 Cardiomegaly  History:        Patient has no prior history of Echocardiogram examinations.                 Cardiomegaly, COPD; Signs/Symptoms:Shortness of Breath and                  Dyspnea.  Sonographer:    Sheralyn Boatman RDCS Referring Phys: [id number] ANASTASSIA DOUTOVA  Sonographer Comments: Technically difficult study due to poor echo windows. Image acquisition challenging due to patient body habitus. IMPRESSIONS  1. Left ventricular ejection fraction, by estimation, is 55 to 60%. Left ventricular ejection fraction by 3D volume is 59 %. The left ventricle has normal function. The left ventricle has no regional wall motion abnormalities. There is moderate left ventricular hypertrophy. Left ventricular diastolic parameters are consistent with Grade I diastolic dysfunction (impaired relaxation).  2. Right ventricular systolic function mildly reduced at base and mid, with relative preserved apical systolic function. The right ventricular size is moderately enlarged. There is moderately elevated pulmonary artery systolic pressure. The estimated right ventricular systolic pressure is 48.4 mmHg.  3. Left atrial size was mildly dilated.  4. Right atrial size was moderately dilated.  5. The mitral valve is normal in structure. Trivial mitral valve regurgitation. No evidence of mitral stenosis.  6. Tricuspid valve regurgitation is mild to moderate.  7. The aortic valve is normal in structure. Aortic valve regurgitation is not visualized. No aortic stenosis is present.  8. The inferior vena cava is dilated in size with <50% respiratory variability, suggesting right atrial pressure of 15 mmHg. FINDINGS  Left Ventricle: Left ventricular ejection fraction, by estimation, is 55 to 60%. Left ventricular ejection fraction by 3D volume is 59 %. The left ventricle has normal function. The left ventricle has no regional wall motion abnormalities. The left ventricular internal cavity size was normal in size. There is moderate left ventricular hypertrophy. Left ventricular diastolic parameters are consistent with Grade I diastolic dysfunction (impaired relaxation). Right Ventricle: Calcified moderate band. The  right ventricular size is moderately enlarged. No increase in right ventricular wall thickness. Right ventricular systolic function mildly reduced at base and mid, with relative preserved apical systolic function. There is moderately elevated pulmonary artery systolic pressure. The tricuspid regurgitant velocity is 2.89 m/s, and with an assumed right atrial pressure of 15 mmHg, the estimated right ventricular systolic pressure is 48.4 mmHg. Left Atrium: Left atrial size was mildly dilated. Right Atrium: Right atrial size was moderately dilated. Pericardium: There is no evidence of pericardial effusion. Presence of pericardial fat pad. Mitral Valve: The  mitral valve is normal in structure. Mild mitral annular calcification. Trivial mitral valve regurgitation. No evidence of mitral valve stenosis. Tricuspid Valve: The tricuspid valve is normal in structure. Tricuspid valve regurgitation is mild to moderate. No evidence of tricuspid stenosis. Aortic Valve: The aortic valve is normal in structure. Aortic valve regurgitation is not visualized. No aortic stenosis is present. Pulmonic Valve: The pulmonic valve was normal in structure. Pulmonic valve regurgitation is trivial. No evidence of pulmonic stenosis. Aorta: Aorta dimension upper limit of normal for age and BSA. The aortic root is normal in size and structure. Venous: The inferior vena cava is dilated in size with less than 50% respiratory variability, suggesting right atrial pressure of 15 mmHg. IAS/Shunts: The interatrial septum appears to be lipomatous. No atrial level shunt detected by color flow Doppler.  LEFT VENTRICLE PLAX 2D LVIDd:         3.60 cm         Diastology LVIDs:         2.50 cm         LV e' medial:    6.31 cm/s LV PW:         1.30 cm         LV E/e' medial:  9.0 LV IVS:        1.30 cm         LV e' lateral:   10.20 cm/s LVOT diam:     2.00 cm         LV E/e' lateral: 5.6 LV SV:         54 LV SV Index:   28 LVOT Area:     3.14 cm        3D Volume  EF                                LV 3D EF:    Left                                             ventricul LV Volumes (MOD)                            ar LV vol d, MOD    73.9 ml                    ejection A2C:                                        fraction LV vol d, MOD    53.3 ml                    by 3D A4C:                                        volume is LV vol s, MOD    28.2 ml                    59 %. A2C: LV vol s, MOD    20.1 ml A4C:  3D Volume EF: LV SV MOD A2C:   45.7 ml       3D EF:        59 % LV SV MOD A4C:   53.3 ml LV SV MOD BP:    38.1 ml RIGHT VENTRICLE             IVC RV S prime:     10.60 cm/s  IVC diam: 2.90 cm TAPSE (M-mode): 1.8 cm LEFT ATRIUM             Index       RIGHT ATRIUM           Index LA diam:        3.90 cm 2.02 cm/m  RA Area:     25.80 cm LA Vol (A2C):   33.2 ml 17.16 ml/m RA Volume:   96.90 ml  50.07 ml/m LA Vol (A4C):   54.8 ml 28.32 ml/m LA Biplane Vol: 43.4 ml 22.43 ml/m  AORTIC VALVE             PULMONIC VALVE LVOT Vmax:   95.70 cm/s  PR End Diast Vel: 1.35 msec LVOT Vmean:  61.300 cm/s LVOT VTI:    0.172 m  AORTA Ao Root diam: 3.70 cm Ao Asc diam:  3.80 cm MITRAL VALVE               TRICUSPID VALVE MV Area (PHT): 2.95 cm    TV Peak grad:   34.6 mmHg MV Decel Time: 257 msec    TV Vmax:        2.94 m/s MV E velocity: 56.80 cm/s  TR Peak grad:   33.4 mmHg MV A velocity: 82.10 cm/s  TR Vmax:        289.00 cm/s MV E/A ratio:  0.69                            SHUNTS                            Systemic VTI:  0.17 m                            Systemic Diam: 2.00 cm Weston Brass MD Electronically signed by Weston Brass MD Signature Date/Time: 09/23/2020/4:25:50 PM    Final    VAS [place] LOWER EXTREMITY VENOUS (DVT)  Result Date: 09/24/2020  Lower Venous DVT Study Patient Name:  Kimberly Ballard  Date of Exam:   09/24/2020 Medical Rec #: [id number]      Accession #:    [id number] Date of Birth: 14-Sep-1940     Patient Gender: F Patient Age:   67 years  Exam Location:  [facility] Procedure:      VAS [place] LOWER EXTREMITY VENOUS (DVT) Referring Phys: MURALI RAMASWAMY --------------------------------------------------------------------------------  Indications: Edema.  Risk Factors: COVID 19 positive. Limitations: Poor ultrasound/tissue interface. Comparison Study: No prior studies. Performing Technologist: Chanda Busing RVT  Examination Guidelines: A complete evaluation includes B-mode imaging, spectral Doppler, color Doppler, and power Doppler as needed of all accessible portions of each vessel. Bilateral testing is considered an integral part of a complete examination. Limited examinations for reoccurring indications may be performed as noted. The reflux portion of the exam is performed with the patient in reverse Trendelenburg.  +---------+---------------+---------+-----------+----------+--------------+ RIGHT    CompressibilityPhasicitySpontaneityPropertiesThrombus Aging +---------+---------------+---------+-----------+----------+--------------+ CFV  Full           Yes      Yes                                 +---------+---------------+---------+-----------+----------+--------------+ SFJ      Full                                                        +---------+---------------+---------+-----------+----------+--------------+ FV Prox  Full                                                        +---------+---------------+---------+-----------+----------+--------------+ FV Mid   Full                                                        +---------+---------------+---------+-----------+----------+--------------+ FV DistalFull                                                        +---------+---------------+---------+-----------+----------+--------------+ PFV      Full                                                        +---------+---------------+---------+-----------+----------+--------------+ POP       Full           Yes      Yes                                 +---------+---------------+---------+-----------+----------+--------------+ PTV      Full                                                        +---------+---------------+---------+-----------+----------+--------------+ PERO     Full                                                        +---------+---------------+---------+-----------+----------+--------------+   +---------+---------------+---------+-----------+----------+--------------+ LEFT     CompressibilityPhasicitySpontaneityPropertiesThrombus Aging +---------+---------------+---------+-----------+----------+--------------+ CFV      Full           Yes      Yes                                 +---------+---------------+---------+-----------+----------+--------------+  SFJ      Full                                                        +---------+---------------+---------+-----------+----------+--------------+ FV Prox  Full                                                        +---------+---------------+---------+-----------+----------+--------------+ FV Mid   Full                                                        +---------+---------------+---------+-----------+----------+--------------+ FV DistalFull                                                        +---------+---------------+---------+-----------+----------+--------------+ PFV      Full                                                        +---------+---------------+---------+-----------+----------+--------------+ POP      Full           Yes      Yes                                 +---------+---------------+---------+-----------+----------+--------------+ PTV      Full                                                        +---------+---------------+---------+-----------+----------+--------------+ PERO     Full                                                         +---------+---------------+---------+-----------+----------+--------------+    Summary: RIGHT: - There is no evidence of deep vein thrombosis in the lower extremity.  - No cystic structure found in the popliteal fossa.  LEFT: - There is no evidence of deep vein thrombosis in the lower extremity.  - No cystic structure found in the popliteal fossa.  *See table(s) above for measurements and observations.    Preliminary    US Abdomen Limited RUQ (LIVER/GB)  Result Date: 09/23/2020 CLINICAL DATA:  80 year old female with abnormal LFTs. EXAM: ULTRASOUND ABDOMEN LIMITED RIGHT UPPER QUADRANT COMPARISON:  CTA chest 09/22/2020. FINDINGS: Gallbladder: Absent as seen on the CTA yesterday. Common bile duct: Diameter: 3 mm,  normal. Liver: Echogenic liver (images 14, 18). No discrete liver lesion. Portal vein is patent on color Doppler imaging with normal direction of blood flow towards the liver. Mildly nodular liver contour on some images such (images 29 and 35). Other: Visible right kidney is within normal limits.  No free fluid. IMPRESSION: 1. Hepatic steatosis, with mildly nodular liver contour raising the possibility of cirrhosis. 2. Absent gallbladder with no evidence of bile duct obstruction. Electronically Signed   By: Odessa FlemingH  Hall M.D.   On: 09/23/2020 06:45    Cardiac Studies   2D echo 09/23/2020 IMPRESSIONS    1. Left ventricular ejection fraction, by estimation, is 55 to 60%. Left  ventricular ejection fraction by 3D volume is 59 %. The left ventricle has  normal function. The left ventricle has no regional wall motion  abnormalities. There is moderate left  ventricular hypertrophy. Left ventricular diastolic parameters are  consistent with Grade I diastolic dysfunction (impaired relaxation).   2. Right ventricular systolic function mildly reduced at base and mid,  with relative preserved apical systolic function. The right ventricular  size is moderately enlarged. There is moderately  elevated pulmonary artery  systolic pressure. The estimated  right ventricular systolic pressure is 48.4 mmHg.   3. Left atrial size was mildly dilated.   4. Right atrial size was moderately dilated.   5. The mitral valve is normal in structure. Trivial mitral valve  regurgitation. No evidence of mitral stenosis.   6. Tricuspid valve regurgitation is mild to moderate.   7. The aortic valve is normal in structure. Aortic valve regurgitation is  not visualized. No aortic stenosis is present.   8. The inferior vena cava is dilated in size with <50% respiratory  variability, suggesting right atrial pressure of 15 mmHg.   Patient Profile     80 y.o. female with a hx of COPD with prior tobacco smoking, quit in 2019 who is being seen for evaluation of elevated troponin at the request of Dr. Adela Glimpseoutova.  Assessment & Plan    Elevated Troponin/RV dysfunction/Pulmonary HTN -hsTrop 12>100 -EKG on admit with anterior T wave abnormality and unfortunately we do not have old EKG to compare -with acute hypoxemia in setting of PNA and COPD exacerbation this enzyme leak may be related to demand ischemia -Anterior T wave changes are concerning but could be due to acute RV strain from hypoxemia in setting of multifocal PNA -2D echo showed normal LVF with EF 59% but moderately enlarged RV with mildly reduced RVF at the base and mid and relative sparing of apical RVF which can be seen in acute PE but not specific for acute PE -she has COPD with acute exacerbation and multifocal PNA>>suspect that hypoxemia is resulting in acute RV strain with RV dilatation and RV dysfunction -she has moderate pulmonary HTN on echo likely related to underlying COPD with acute exacerbation in setting of hypoxemia -Chest CT with no evidence of acute PE on 8/22 and repeat Chest CTA pending for elevated ddimer -I do not think her Trop elevation is related to ACS and more consistent with demand ischemia in the setting of acute  respiratory decompensation -Findings consistent with pulmonary HTN and 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 and worsened in the setting of acute COPD exacerbation and PNA -need to treat underlying respiratory illness and may benefit from right heart cath prior to discharge -BNP is elevated but may be related to underlying respiratory issues -RAP is at least 15mmHg on echo so will give  a dose of Lasix  IV now -follow I&O's, daily weights and renal function while diuresing   Atrial fibrillation with RVR -she went into this this am -HR in the low 100's -will place on IV Cardizem gtt to see if we can get HR controlled -would like to avoid Amio in setting of acute respiratory decompensation and chronic COPD --CHA2DS2-VASc Score = 3  This indicates a 3.2% annual risk of stroke. The patient's score is based upon: CHF History: No HTN History: No Diabetes History: No Stroke History: No Vascular Disease History: No Age Score: 2 Gender Score: 1 -will start on IV Heparin gtt for now  The patient is critically ill with multiple organ systems failure and requires high complexity decision making for assessment and support, frequent evaluation and titration of therapies, application of advanced monitoring technologies and extensive interpretation of multiple databases. Critical Care Time devoted to patient care services described in this note independent of APP time is 60 minutes with >50% of time spent in direct patient care.         For questions or updates, please contact [facility] Please consult [email or web address] for contact info under        Signed, Armanda Magic, MD  09/24/2020, 10:43 AM

## 2020-09-24 NOTE — Progress Notes (Addendum)
eLink Physician-Brief Progress Note Patient Name: Kimberly Ballard DOB: 1940/12/07 MRN: [id number]   Date of Service  09/24/2020  HPI/Events of Note  Patient remains in Afib with heart rate ranging from 100 to 120, BP 119/81. Patient may be trying to convert.  eICU Interventions  Will order Cardizem 5 mg iv x 1 to see if it takes her over the threshold. Electrolytes are being replaced.        Thomasene Lot Rameen Quinney 09/24/2020, 4:29 AM

## 2020-09-24 NOTE — Progress Notes (Addendum)
PROGRESS NOTE    Kimberly Ballard  WYS:[id number] DOB: 07/04/1940 DOA: 09/22/2020 PCP: System, Provider Not In  Brief Narrative:  The patient is 80 year old female with history of COPD presented with shortness of breath, body aches, decreased appetite and coughing.  Patients daughter was diagnosed with COVID last week.  She was started on Paxilovid empirically due to risk of exposure.  Patient continued to get worse and developed shortness of breath.  On EMS arrival she required 6 L/min of oxygen.  In the ED SARS-CoV-2 RT-PCR was negative.  Chest x-ray showed bilateral infiltrates, CTA was negative for PE but showed evidence of bilateral infiltrates.  Patient started on Rocephin and Zithromax for community-acquired pneumonia.  Subsequently she decompensated and worsened and went into A. fib with RVR and had to be placed on BiPAP and she is subsequently moved to the stepdown unit.  We are empirically treating for COVID and pulmonary has been consulted as well as cardiology.  Cardiology started the patient on Cardizem drip and pulmonary recommending continuing steroids     Assessment & Plan:   Active Problems:   CAP (community acquired pneumonia)   Acute respiratory failure with hypoxia and hypercapnia (HCC)   Sepsis ([place])   Elevated LFTs   Cardiomegaly   Elevated troponin   Atrial fibrillation (HCC)  Acute respiratory failure with hypoxia requiring noninvasive positive pressure ventilation with BiPAP 12/6 60% FiO2 -Multifactorial from underlying multifocal pneumonia, diastolic CHF -Patient is currently on Rocephin and Zithromax and will continue -Follow blood culture results -She was given 1 dose of Lasix 40 mg IV yesterday and will give another dose today -CTA of the Chest PE protocol done and showed "No evidence of acute pulmonary embolism. Patchy consolidative opacities in the lung bases and  lingula have slightly worsened compared to the prior study and remain concerning for multifocal  pneumonia, with differential including COVID. As before, follow-up chest CT in 3 months is recommended to assess for resolution. Unchanged cardiomegaly with evidence of right heart failure and pulmonary hypertension as above. Coronary artery calcifications and Aortic Atherosclerosis (ICD10-I70.0). -SpO2: 92 % O2 Flow Rate (L/min): 15 L/min FiO2 (%): 60 % -Inflammatory Markers Recent Labs    09/22/20 1441 09/23/20 1844 09/24/20 0323  DDIMER 1.97* 2.04*  --   FERRITIN 1,135*  --   --   LDH 518*  --   --   CRP 37.4*  --  28.0*    Lab Results  Component Value Date   [place] NEGATIVE 09/24/2020   [place] NEGATIVE 09/22/2020    -Speech therapy consulted for swallow evaluation -Appreciate PCCM consult and they placed the patient on BiPAP but has now been weaned to 15 L; pulmonary feels that this is clinically COVID given that hypoxemia and hypercapnia -Pulmonary recommends continuing BiPAP nightly and 2 hours twice a day -They are recommending nebs -They are also recommending steroids for COPD and suspected cold -She is given IV Lasix within pulmonary give a fluid bolus of 500 cc and then D5 LR -Currently remains in the clinical diet but overall oral diet tomorrow  A. fib with RVR -Went into A. fib with RVR early this morning -Heart rates were ranging in the mid 100s -She was placed on a Cardizem drip -Currently on anticoagulation with IV heparin given her CHA2DS2-VASc score of 3 -Continue to monitor on telemetry  AKI -Resolving -Patient's BUNs/creatinine went from 69/1.28 and is now trended down to 58/0.75 -Avoid nephrotoxic medications, contrast dyes, hypotension and renally dose medications -Continue monitor  and trend renal function and repeat CMP in a.m.   Acute Diastolic CHF/right ventricular function/pulmonary hypertension -Echocardiogram is currently pending -She has bilateral crackles on auscultation -We will give 1 dose of Lasix 40 mg IV and she is given  another dose of IV Lasix today -BNP was elevated but cardiology feels that this may be underlying related to respiratory issues -Strict I's and O's and daily weights -Given her history of COPD with acute exacerbation multifocal pneumonia, cardiology feels that the hypoxemia is resulting in acute right ventricular strain with right ventricular dilatation and right ventricular dysfunction -Patient has moderate pulm hypertension on echo related to underlying COPD with acute exacerbation in the setting of hypoxemia -Repeat CTA PE as above -BNP was elevated at 889.7 -Cardiology has been consulted and appreciate further care per them   Elevated Troponin -Likely demand ischemia -hSTrop 12 -> 100 and then trended down to 52 -EKG shows T wave abnormalities in anterior chest leads -Cardiology has been consulted   Abnormal LFTs/Transaminitis -Unclear etiology -?  Passive liver congestion from CHF -Total bilirubin is normal, alk phos is normal -LFTs on admission showed an AST of 503 and ALT of 439; now improving as AST is now 91 and ALT is 264   Right middle lobe nodule -Will need repeat CT in 3 months after treatment of pneumonia   Obesity -Complicates overall prognosis and care -Estimated body mass index is 35.5 kg/m as calculated from the following:   Height as of this encounter: $RemoveBeforeD'5\' 3"'HfgCpWgdtlQlHf$  (1.6 m).   Weight as of this encounter: 90.9 kg. -Weight Loss and Dietary Counseling    DVT prophylaxis: Anticoagulated with heparin drip Code Status: FULL CODE  Family Communication: (No family currently at bedside Disposition Plan: Continue to monitor in the stepdown unit  Status is: Inpatient  Remains inpatient appropriate because:Unsafe d/c plan, IV treatments appropriate due to intensity of illness or inability to take PO, and Inpatient level of care appropriate due to severity of illness  Dispo: The patient is from: Home              Anticipated d/c is to:  TBD              Patient currently is  not medically stable to d/c.   Difficult to place patient No   Consultants:  Pulmonary/PCCM Cardiology  Procedures:  ECHOCARDIOGRAM IMPRESSIONS     1. Left ventricular ejection fraction, by estimation, is 55 to 60%. Left  ventricular ejection fraction by 3D volume is 59 %. The left ventricle has  normal function. The left ventricle has no regional wall motion  abnormalities. There is moderate left  ventricular hypertrophy. Left ventricular diastolic parameters are  consistent with Grade I diastolic dysfunction (impaired relaxation).   2. Right ventricular systolic function mildly reduced at base and mid,  with relative preserved apical systolic function. The right ventricular  size is moderately enlarged. There is moderately elevated pulmonary artery  systolic pressure. The estimated  right ventricular systolic pressure is 81.0 mmHg.   3. Left atrial size was mildly dilated.   4. Right atrial size was moderately dilated.   5. The mitral valve is normal in structure. Trivial mitral valve  regurgitation. No evidence of mitral stenosis.   6. Tricuspid valve regurgitation is mild to moderate.   7. The aortic valve is normal in structure. Aortic valve regurgitation is  not visualized. No aortic stenosis is present.   8. The inferior vena cava is dilated in size with <50%  respiratory  variability, suggesting right atrial pressure of 15 mmHg.   FINDINGS   Left Ventricle: Left ventricular ejection fraction, by estimation, is 55  to 60%. Left ventricular ejection fraction by 3D volume is 59 %. The left  ventricle has normal function. The left ventricle has no regional wall  motion abnormalities. The left  ventricular internal cavity size was normal in size. There is moderate  left ventricular hypertrophy. Left ventricular diastolic parameters are  consistent with Grade I diastolic dysfunction (impaired relaxation).   Right Ventricle: Calcified moderate band. The right ventricular  size is  moderately enlarged. No increase in right ventricular wall thickness.  Right ventricular systolic function mildly reduced at base and mid, with  relative preserved apical systolic  function. There is moderately elevated pulmonary artery systolic pressure.  The tricuspid regurgitant velocity is 2.89 m/s, and with an assumed right  atrial pressure of 15 mmHg, the estimated right ventricular systolic  pressure is 16.1 mmHg.   Left Atrium: Left atrial size was mildly dilated.   Right Atrium: Right atrial size was moderately dilated.   Pericardium: There is no evidence of pericardial effusion. Presence of  pericardial fat pad.   Mitral Valve: The mitral valve is normal in structure. Mild mitral annular  calcification. Trivial mitral valve regurgitation. No evidence of mitral  valve stenosis.   Tricuspid Valve: The tricuspid valve is normal in structure. Tricuspid  valve regurgitation is mild to moderate. No evidence of tricuspid  stenosis.   Aortic Valve: The aortic valve is normal in structure. Aortic valve  regurgitation is not visualized. No aortic stenosis is present.   Pulmonic Valve: The pulmonic valve was normal in structure. Pulmonic valve  regurgitation is trivial. No evidence of pulmonic stenosis.   Aorta: Aorta dimension upper limit of normal for age and BSA. The aortic  root is normal in size and structure.   Venous: The inferior vena cava is dilated in size with less than 50%  respiratory variability, suggesting right atrial pressure of 15 mmHg.   IAS/Shunts: The interatrial septum appears to be lipomatous. No atrial  level shunt detected by color flow Doppler.      LEFT VENTRICLE  PLAX 2D  LVIDd:         3.60 cm         Diastology  LVIDs:         2.50 cm         LV e' medial:    6.31 cm/s  LV PW:         1.30 cm         LV E/e' medial:  9.0  LV IVS:        1.30 cm         LV e' lateral:   10.20 cm/s  LVOT diam:     2.00 cm         LV E/e' lateral: 5.6   LV SV:         54  LV SV Index:   28  LVOT Area:     3.14 cm        3D Volume EF                                 LV 3D EF:    Left  ventricul  LV Volumes (MOD)                            ar  LV vol d, MOD    73.9 ml                    ejection  A2C:                                        fraction  LV vol d, MOD    53.3 ml                    by 3D  A4C:                                        volume is  LV vol s, MOD    28.2 ml                    59 %.  A2C:  LV vol s, MOD    20.1 ml  A4C:                           3D Volume EF:  LV SV MOD A2C:   45.7 ml       3D EF:        59 %  LV SV MOD A4C:   53.3 ml  LV SV MOD BP:    38.1 ml   RIGHT VENTRICLE             IVC  RV S prime:     10.60 cm/s  IVC diam: 2.90 cm  TAPSE (M-mode): 1.8 cm   LEFT ATRIUM             Index       RIGHT ATRIUM           Index  LA diam:        3.90 cm 2.02 cm/m  RA Area:     25.80 cm  LA Vol (A2C):   33.2 ml 17.16 ml/m RA Volume:   96.90 ml  50.07 ml/m  LA Vol (A4C):   54.8 ml 28.32 ml/m  LA Biplane Vol: 43.4 ml 22.43 ml/m   AORTIC VALVE             PULMONIC VALVE  LVOT Vmax:   95.70 cm/s  PR End Diast Vel: 1.35 msec  LVOT Vmean:  61.300 cm/s  LVOT VTI:    0.172 m     AORTA  Ao Root diam: 3.70 cm  Ao Asc diam:  3.80 cm   MITRAL VALVE               TRICUSPID VALVE  MV Area (PHT): 2.95 cm    TV Peak grad:   34.6 mmHg  MV Decel Time: 257 msec    TV Vmax:        2.94 m/s  MV E velocity: 56.80 cm/s  TR Peak grad:   33.4 mmHg  MV A velocity: 82.10 cm/s  TR Vmax:        289.00 cm/s  MV E/A ratio:  0.69  SHUNTS                             Systemic VTI:  0.17 m                             Systemic Diam: 2.00 cm   Antimicrobials:  Anti-infectives (From admission, onward)    Start     Dose/Rate Route Frequency Ordered Stop   09/23/20 1800  cefTRIAXone (ROCEPHIN) 2 g in sodium chloride 0.9 % 100 mL IVPB        2 g 200 mL/hr  over 30 Minutes Intravenous Every 24 hours 09/22/20 1842 09/28/20 1759   09/23/20 1800  azithromycin (ZITHROMAX) 500 mg in sodium chloride 0.9 % 250 mL IVPB        500 mg 250 mL/hr over 60 Minutes Intravenous Every 24 hours 09/22/20 1842 09/28/20 1759   09/22/20 1630  cefTRIAXone (ROCEPHIN) 1 g in sodium chloride 0.9 % 100 mL IVPB        1 g 200 mL/hr over 30 Minutes Intravenous  Once 09/22/20 1618 09/22/20 1806   09/22/20 1630  azithromycin (ZITHROMAX) 500 mg in sodium chloride 0.9 % 250 mL IVPB        500 mg 250 mL/hr over 60 Minutes Intravenous  Once 09/22/20 1618 09/22/20 1939        Subjective: Seen and examined and she is on BiPAP and is tachypneic and tachycardic.  Denies any pain.  Admits to being a little bit short of breath.  Feels very fatigued.  No other concerns or complaints at this time.  Objective: Vitals:   09/24/20 1300 09/24/20 1400 09/24/20 1605 09/24/20 1700  BP: 137/90  104/65 (!) 109/57  Pulse: 90 (!) 111 65 74  Resp: (!) 27 (!) 21 (!) 21 (!) 24  Temp:      TempSrc:      SpO2: 91% 97% 91% 92%  Weight:      Height:        Intake/Output Summary (Last 24 hours) at 09/24/2020 1828 Last data filed at 09/24/2020 1820 Gross per 24 hour  Intake 869.53 ml  Output --  Net 869.53 ml   [employer]   09/22/20 2020  Weight: 90.9 kg   Examination: Physical Exam:  Constitutional: WN/WD obese Caucasian female currently in mild respiratory distress wearing BiPAP Eyes: Lids and conjunctivae normal, sclerae anicteric  ENMT: External Ears, Nose appear normal.  Slightly hard of hearing Neck: Appears normal, supple, no cervical masses, normal ROM, no appreciable thyromegaly; has mild JVD Respiratory: Diminished to auscultation bilaterally with coarse breath sounds, and some crackles noted.  Increased respiratory effort and is wearing BiPAP Cardiovascular: Irregularly irregular and tachycardic, no murmurs / rubs / gallops. S1 and S2 auscultated.  1+ lower extremity  edema Abdomen: Soft, non-tender, Distended secondary body habitus. Bowel sounds positive.  GU: Deferred. Musculoskeletal: No clubbing / cyanosis of digits/nails. No joint deformity upper and lower extremities. Good ROM, no contractures. Normal strength and muscle tone.  Skin: No rashes, lesions, ulcers on limited skin evaluation. No induration; Warm and dry.  Neurologic: CN 2-12 grossly intact with no focal deficits. Romberg sign and cerebellar reflexes not assessed.  Psychiatric: Normal judgment and insight. Alert and oriented x 3. Normal mood and appropriate affect.   Data Reviewed: I have personally reviewed following labs and imaging studies  CBC: Recent Labs  Lab 09/22/20 1441  09/23/20 0207 09/24/20 0323  WBC 21.6* 19.7* 16.5*  NEUTROABS 16.7* 16.9*  --   HGB 15.6* 15.7* 15.4*  HCT 47.4* 49.4* 49.4*  MCV 98.3 100.6* 100.8*  PLT 283 253 318   Basic Metabolic Panel: Recent Labs  Lab 09/22/20 1441 09/22/20 1500 09/23/20 0207 09/24/20 0323  NA 138  --  139 141  K 3.6  --  3.9 3.6  CL 99  --  102 99  CO2 28  --  25 30  GLUCOSE 125*  --  141* 187*  BUN 69*  --  69* 58*  CREATININE 1.28*  --  1.04* 0.75  CALCIUM 8.5*  --  8.3* 8.3*  MG  --  2.6* 2.7* 2.8*  PHOS  --  3.8 3.5  --    GFR: Estimated Creatinine Clearance: 61 mL/min (by C-G formula based on SCr of 0.75 mg/dL). Liver Function Tests: Recent Labs  Lab 09/22/20 1441 09/23/20 0207 09/24/20 0323  AST 503* 170* 91*  ALT 439* 327* 264*  ALKPHOS 102 97 112  BILITOT 1.4* 1.1 0.8  PROT 7.3 6.8 7.0  ALBUMIN 3.3* 3.1* 2.9*   No results for input(s): LIPASE, AMYLASE in the last 168 hours. No results for input(s): AMMONIA in the last 168 hours. Coagulation Profile: Recent Labs  Lab 09/22/20 2050  INR 1.3*   Cardiac Enzymes: Recent Labs  Lab 09/22/20 2148  CKTOTAL 60   BNP (last 3 results) No results for input(s): PROBNP in the last 8760 hours. HbA1C: No results for input(s): HGBA1C in the last 72  hours. CBG: No results for input(s): GLUCAP in the last 168 hours. Lipid Profile: Recent Labs    09/22/20 1441 09/23/20 0207  CHOL  --  104  HDL  --  25*  LDLCALC  --  63  TRIG 78 80  CHOLHDL  --  4.2   Thyroid Function Tests: Recent Labs    09/23/20 0207  TSH 1.988   Anemia Panel: Recent Labs    09/22/20 1441  FERRITIN 1,135*   Sepsis Labs: Recent Labs  Lab 09/22/20 1441 09/23/20 0207 09/24/20 0323  PROCALCITON 0.48 0.49 0.23  LATICACIDVEN 1.7  --   --     Recent Results (from the past 240 hour(s))  Resp Panel by RT-PCR (Flu A&B, Covid) Nasopharyngeal Swab     Status: None   Collection Time: 09/22/20  2:41 PM   Specimen: Nasopharyngeal Swab; Nasopharyngeal(NP) swabs in vial transport medium  Result Value Ref Range Status   SARS Coronavirus 2 by RT PCR NEGATIVE NEGATIVE Final    Comment: (NOTE) SARS-CoV-2 target nucleic acids are NOT DETECTED.  The SARS-CoV-2 RNA is generally detectable in upper respiratory specimens during the acute phase of infection. The lowest concentration of SARS-CoV-2 viral copies this assay can detect is 138 copies/mL. A negative result does not preclude SARS-Cov-2 infection and should not be used as the sole basis for treatment or other patient management decisions. A negative result may occur with  improper specimen collection/handling, submission of specimen other than nasopharyngeal swab, presence of viral mutation(s) within the areas targeted by this assay, and inadequate number of viral copies(<138 copies/mL). A negative result must be combined with clinical observations, patient history, and epidemiological information. The expected result is Negative.  Fact Sheet for Patients:  BloggerCourse.com  Fact Sheet for Healthcare Providers:  SeriousBroker.it  This test is no t yet approved or cleared by the [place] FDA and  has been authorized for detection and/or diagnosis  of SARS-CoV-2 by FDA under an Emergency Use Authorization (EUA). This EUA will remain  in effect (meaning this test can be used) for the duration of the COVID-19 declaration under Section 564(b)(1) of the Act, 21 U.S.C.section 360bbb-3(b)(1), unless the authorization is terminated  or revoked sooner.       Influenza A by PCR NEGATIVE NEGATIVE Final   Influenza B by PCR NEGATIVE NEGATIVE Final    Comment: (NOTE) The Xpert Xpress SARS-CoV-2/FLU/RSV plus assay is intended as an aid in the diagnosis of influenza from Nasopharyngeal swab specimens and should not be used as a sole basis for treatment. Nasal washings and aspirates are unacceptable for Xpert Xpress SARS-CoV-2/FLU/RSV testing.  Fact Sheet for Patients: BloggerCourse.com  Fact Sheet for Healthcare Providers: SeriousBroker.it  This test is not yet approved or cleared by the [place] FDA and has been authorized for detection and/or diagnosis of SARS-CoV-2 by FDA under an Emergency Use Authorization (EUA). This EUA will remain in effect (meaning this test can be used) for the duration of the COVID-19 declaration under Section 564(b)(1) of the Act, 21 U.S.C. section 360bbb-3(b)(1), unless the authorization is terminated or revoked.  Performed at [facility], [address] [place], [place] [address]   Blood Culture (routine x 2)     Status: None (Preliminary result)   Collection Time: 09/22/20  2:41 PM   Specimen: BLOOD  Result Value Ref Range Status   Specimen Description   Final    BLOOD BLOOD LEFT FOREARM Performed at [facility], [address] [place], [place] [address]    Special Requests   Final    BOTTLES DRAWN AEROBIC AND ANAEROBIC Blood Culture adequate volume Performed at [facility], [address], [place], [place] [address]    Culture   Final    NO GROWTH 2 DAYS Performed at [facility], [address], [place], [place] [address]    Report Status PENDING  Incomplete  Blood Culture (routine x 2)     Status: None (Preliminary result)   Collection Time: 09/22/20  5:20 PM   Specimen: BLOOD LEFT HAND  Result Value Ref Range Status   Specimen Description   Final    BLOOD LEFT HAND Performed at [facility], [address], [place], [place] [address]    Special Requests   Final    BOTTLES DRAWN AEROBIC AND ANAEROBIC Blood Culture results may not be optimal due to an excessive volume of blood received in culture bottles Performed at [facility], [address], [place], [place] [address]    Culture   Final    NO GROWTH 2 DAYS Performed at [facility], [address] [place], [place] [address]    Report Status PENDING  Incomplete  Respiratory (~20 pathogens) panel by PCR     Status: None   Collection Time: 09/22/20 10:47 PM   Specimen: Nasopharyngeal Swab; Respiratory  Result Value Ref Range Status   Adenovirus NOT DETECTED NOT DETECTED Final   Coronavirus 229E NOT DETECTED NOT DETECTED Final    Comment: (NOTE) The Coronavirus on the Respiratory Panel, DOES NOT test for the novel  Coronavirus (2019 nCoV)    Coronavirus HKU1 NOT DETECTED NOT DETECTED Final   Coronavirus NL63 NOT DETECTED NOT DETECTED Final   Coronavirus OC43 NOT DETECTED NOT DETECTED Final   Metapneumovirus NOT DETECTED NOT DETECTED Final   Rhinovirus / Enterovirus NOT DETECTED NOT DETECTED Final  Influenza A NOT DETECTED NOT DETECTED Final   Influenza B NOT DETECTED NOT DETECTED Final   Parainfluenza Virus 1 NOT DETECTED NOT DETECTED Final   Parainfluenza Virus 2 NOT DETECTED NOT DETECTED Final   Parainfluenza Virus 3 NOT DETECTED NOT DETECTED Final   Parainfluenza Virus 4 NOT DETECTED NOT DETECTED Final   Respiratory Syncytial Virus NOT DETECTED NOT DETECTED Final   Bordetella pertussis NOT DETECTED NOT DETECTED Final   Bordetella Parapertussis NOT  DETECTED NOT DETECTED Final   Chlamydophila pneumoniae NOT DETECTED NOT DETECTED Final   Mycoplasma pneumoniae NOT DETECTED NOT DETECTED Final    Comment: Performed at [facility], [place] [address] [place], [place] [address]  MRSA Next Gen by PCR, Nasal     Status: None   Collection Time: 09/24/20  5:24 AM   Specimen: Nasal Mucosa; Nasal Swab  Result Value Ref Range Status   MRSA by PCR Next Gen NOT DETECTED NOT DETECTED Final    Comment: (NOTE) The GeneXpert MRSA Assay (FDA approved for NASAL specimens only), is one component of a comprehensive MRSA colonization surveillance program. It is not intended to diagnose MRSA infection nor to guide or monitor treatment for MRSA infections. Test performance is not FDA approved in patients less than 40 years old. Performed at [facility], [place] [address] [place], [place] [address]   Resp Panel by RT-PCR (Flu A&B, Covid) Nasopharyngeal Swab     Status: None   Collection Time: 09/24/20  2:21 PM   Specimen: Nasopharyngeal Swab; Nasopharyngeal(NP) swabs in vial transport medium  Result Value Ref Range Status   SARS Coronavirus 2 by RT PCR NEGATIVE NEGATIVE Final    Comment: (NOTE) SARS-CoV-2 target nucleic acids are NOT DETECTED.  The SARS-CoV-2 RNA is generally detectable in upper respiratory specimens during the acute phase of infection. The lowest concentration of SARS-CoV-2 viral copies this assay can detect is 138 copies/mL. A negative result does not preclude SARS-Cov-2 infection and should not be used as the sole basis for treatment or other patient management decisions. A negative result may occur with  improper specimen collection/handling, submission of specimen other than nasopharyngeal swab, presence of viral mutation(s) within the areas targeted by this assay, and inadequate number of viral copies(<138 copies/mL). A negative result must be combined with clinical observations, patient history, and  epidemiological information. The expected result is Negative.  Fact Sheet for Patients:  EntrepreneurPulse.com.au  Fact Sheet for Healthcare Providers:  IncredibleEmployment.be  This test is no t yet approved or cleared by the [place] FDA and  has been authorized for detection and/or diagnosis of SARS-CoV-2 by FDA under an Emergency Use Authorization (EUA). This EUA will remain  in effect (meaning this test can be used) for the duration of the COVID-19 declaration under Section 564(b)(1) of the Act, 21 U.S.C.section 360bbb-3(b)(1), unless the authorization is terminated  or revoked sooner.       Influenza A by PCR NEGATIVE NEGATIVE Final   Influenza B by PCR NEGATIVE NEGATIVE Final    Comment: (NOTE) The Xpert Xpress SARS-CoV-2/FLU/RSV plus assay is intended as an aid in the diagnosis of influenza from Nasopharyngeal swab specimens and should not be used as a sole basis for treatment. Nasal washings and aspirates are unacceptable for Xpert Xpress SARS-CoV-2/FLU/RSV testing.  Fact Sheet for Patients: EntrepreneurPulse.com.au  Fact Sheet for Healthcare Providers: IncredibleEmployment.be  This test is not yet approved or cleared by the [place] FDA and has been authorized for detection and/or diagnosis  of SARS-CoV-2 by FDA under an Emergency Use Authorization (EUA). This EUA will remain in effect (meaning this test can be used) for the duration of the COVID-19 declaration under Section 564(b)(1) of the Act, 21 U.S.C. section 360bbb-3(b)(1), unless the authorization is terminated or revoked.  Performed at [facility], [address] [place], [place] [address]      RN Pressure Injury Documentation:     Estimated body mass index is 35.5 kg/m as calculated from the following:   Height as of this encounter: 5\' 3"  (1.6 m).   Weight as of this encounter: 90.9  kg.  Malnutrition Type:      Malnutrition Characteristics:      Nutrition Interventions:        Radiology Studies: CT Angio Chest Pulmonary Embolism (PE) W or WO Contrast  Result Date: 09/24/2020 CLINICAL DATA:  Shortness of breath, body aches, cough, elevated D-dimer EXAM: CT ANGIOGRAPHY CHEST WITH CONTRAST TECHNIQUE: Multidetector CT imaging of the chest was performed using the standard protocol during bolus administration of intravenous contrast. Multiplanar CT image reconstructions and MIPs were obtained to evaluate the vascular anatomy. CONTRAST:  61mL OMNIPAQUE IOHEXOL 350 MG/ML SOLN COMPARISON:  CTA chest 2 days prior FINDINGS: Cardiovascular: Satisfactory opacification of the pulmonary arteries to the segmental level. There is no evidence of acute or chronic pulmonary embolism. The heart is enlarged. Coronary artery calcifications and calcified atherosclerotic plaque of the aortic arch are again seen. There is no pericardial effusion. The main pulmonary artery is significantly enlarged measuring up to 4.0 cm. There is reflux of contrast into the IVC and hepatic veins, also seen on the prior study. Mediastinum/Nodes: The imaged thyroid is unremarkable. A 1.0 cm precarinal lymph node and 1.0 cm right hilar lymph node are unchanged, nonspecific and possibly reactive. There is no other mediastinal or hilar lymphadenopathy. There is no axillary lymphadenopathy. Lungs/Pleura: The trachea and central airways are patent. Bilateral bronchial wall thickening is similar to the prior study. There is a background of mild-to-moderate centrilobular emphysema. There are consolidative opacities in both lung bases and peripheral opacity in the lingula, slightly worsened compared to the prior study. Some of the opacities again have a nodular appearance (for example in the superior segment of the left lower lobe measuring up to 8 mm, 4-34). There is no pleural effusion or pneumothorax. Calcified pleural  plaque along the right base is unchanged, likely related to prior asbestos exposure. Upper Abdomen: The imaged portions of the upper abdominal viscera are unremarkable. Musculoskeletal: Deformity of the right anterior fourth through sixth ribs is likely related to prior trauma. There is multilevel degenerative change of the thoracic spine with mild anterior wedge deformity of the T3 vertebral body, unchanged. There is no acute osseous abnormality. Review of the MIP images confirms the above findings. IMPRESSION: 1. No evidence of acute pulmonary embolism. 2. Patchy consolidative opacities in the lung bases and lingula have slightly worsened compared to the prior study and remain concerning for multifocal pneumonia, with differential including COVID. As before, follow-up chest CT in 3 months is recommended to assess for resolution. 3. Unchanged cardiomegaly with evidence of right heart failure and pulmonary hypertension as above. 4. Coronary artery calcifications and Aortic Atherosclerosis (ICD10-I70.0). Electronically Signed   By: 91m M.D.   On: 09/24/2020 14:44   ECHOCARDIOGRAM COMPLETE  Result Date: 09/23/2020    ECHOCARDIOGRAM REPORT   Patient Name:   Kimberly Ballard Date of Exam: 09/23/2020 Medical Rec #:  09/25/2020  Height:       63.0 in Accession #:    [id number]    Weight:       200.4 lb Date of Birth:  11-Jun-1940    BSA:          1.935 m Patient Age:    24 years      BP:           122/76 mmHg Patient Gender: F             HR:           76 bpm. Exam Location:  Inpatient Procedure: 2D Echo, 3D Echo, Cardiac Doppler and Color Doppler Indications:    I51.7 Cardiomegaly  History:        Patient has no prior history of Echocardiogram examinations.                 Cardiomegaly, COPD; Signs/Symptoms:Shortness of Breath and                 Dyspnea.  Sonographer:    Roseanna Rainbow RDCS Referring Phys: [place]  Sonographer Comments: Technically difficult study due to poor echo windows. Image  acquisition challenging due to patient body habitus. IMPRESSIONS  1. Left ventricular ejection fraction, by estimation, is 55 to 60%. Left ventricular ejection fraction by 3D volume is 59 %. The left ventricle has normal function. The left ventricle has no regional wall motion abnormalities. There is moderate left ventricular hypertrophy. Left ventricular diastolic parameters are consistent with Grade I diastolic dysfunction (impaired relaxation).  2. Right ventricular systolic function mildly reduced at base and mid, with relative preserved apical systolic function. The right ventricular size is moderately enlarged. There is moderately elevated pulmonary artery systolic pressure. The estimated right ventricular systolic pressure is 21.3 mmHg.  3. Left atrial size was mildly dilated.  4. Right atrial size was moderately dilated.  5. The mitral valve is normal in structure. Trivial mitral valve regurgitation. No evidence of mitral stenosis.  6. Tricuspid valve regurgitation is mild to moderate.  7. The aortic valve is normal in structure. Aortic valve regurgitation is not visualized. No aortic stenosis is present.  8. The inferior vena cava is dilated in size with <50% respiratory variability, suggesting right atrial pressure of 15 mmHg. FINDINGS  Left Ventricle: Left ventricular ejection fraction, by estimation, is 55 to 60%. Left ventricular ejection fraction by 3D volume is 59 %. The left ventricle has normal function. The left ventricle has no regional wall motion abnormalities. The left ventricular internal cavity size was normal in size. There is moderate left ventricular hypertrophy. Left ventricular diastolic parameters are consistent with Grade I diastolic dysfunction (impaired relaxation). Right Ventricle: Calcified moderate band. The right ventricular size is moderately enlarged. No increase in right ventricular wall thickness. Right ventricular systolic function mildly reduced at base and mid, with  relative preserved apical systolic function. There is moderately elevated pulmonary artery systolic pressure. The tricuspid regurgitant velocity is 2.89 m/s, and with an assumed right atrial pressure of 15 mmHg, the estimated right ventricular systolic pressure is 08.6 mmHg. Left Atrium: Left atrial size was mildly dilated. Right Atrium: Right atrial size was moderately dilated. Pericardium: There is no evidence of pericardial effusion. Presence of pericardial fat pad. Mitral Valve: The mitral valve is normal in structure. Mild mitral annular calcification. Trivial mitral valve regurgitation. No evidence of mitral valve stenosis. Tricuspid Valve: The tricuspid valve is normal in structure. Tricuspid valve regurgitation is mild to moderate. No evidence  of tricuspid stenosis. Aortic Valve: The aortic valve is normal in structure. Aortic valve regurgitation is not visualized. No aortic stenosis is present. Pulmonic Valve: The pulmonic valve was normal in structure. Pulmonic valve regurgitation is trivial. No evidence of pulmonic stenosis. Aorta: Aorta dimension upper limit of normal for age and BSA. The aortic root is normal in size and structure. Venous: The inferior vena cava is dilated in size with less than 50% respiratory variability, suggesting right atrial pressure of 15 mmHg. IAS/Shunts: The interatrial septum appears to be lipomatous. No atrial level shunt detected by color flow Doppler.  LEFT VENTRICLE PLAX 2D LVIDd:         3.60 cm         Diastology LVIDs:         2.50 cm         LV e' medial:    6.31 cm/s LV PW:         1.30 cm         LV E/e' medial:  9.0 LV IVS:        1.30 cm         LV e' lateral:   10.20 cm/s LVOT diam:     2.00 cm         LV E/e' lateral: 5.6 LV SV:         54 LV SV Index:   28 LVOT Area:     3.14 cm        3D Volume EF                                LV 3D EF:    Left                                             ventricul LV Volumes (MOD)                            ar LV vol d, MOD     73.9 ml                    ejection A2C:                                        fraction LV vol d, MOD    53.3 ml                    by 3D A4C:                                        volume is LV vol s, MOD    28.2 ml                    59 %. A2C: LV vol s, MOD    20.1 ml A4C:                           3D Volume EF: LV SV MOD A2C:   45.7 ml  3D EF:        59 % LV SV MOD A4C:   53.3 ml LV SV MOD BP:    38.1 ml RIGHT VENTRICLE             IVC RV S prime:     10.60 cm/s  IVC diam: 2.90 cm TAPSE (M-mode): 1.8 cm LEFT ATRIUM             Index       RIGHT ATRIUM           Index LA diam:        3.90 cm 2.02 cm/m  RA Area:     25.80 cm LA Vol (A2C):   33.2 ml 17.16 ml/m RA Volume:   96.90 ml  50.07 ml/m LA Vol (A4C):   54.8 ml 28.32 ml/m LA Biplane Vol: 43.4 ml 22.43 ml/m  AORTIC VALVE             PULMONIC VALVE LVOT Vmax:   95.70 cm/s  PR End Diast Vel: 1.35 msec LVOT Vmean:  61.300 cm/s LVOT VTI:    0.172 m  AORTA Ao Root diam: 3.70 cm Ao Asc diam:  3.80 cm MITRAL VALVE               TRICUSPID VALVE MV Area (PHT): 2.95 cm    TV Peak grad:   34.6 mmHg MV Decel Time: 257 msec    TV Vmax:        2.94 m/s MV E velocity: 56.80 cm/s  TR Peak grad:   33.4 mmHg MV A velocity: 82.10 cm/s  TR Vmax:        289.00 cm/s MV E/A ratio:  0.69                            SHUNTS                            Systemic VTI:  0.17 m                            Systemic Diam: 2.00 cm Weston Brass MD Electronically signed by Weston Brass MD Signature Date/Time: 09/23/2020/4:25:50 PM    Final    VAS [place] LOWER EXTREMITY VENOUS (DVT)  Result Date: 09/24/2020  Lower Venous DVT Study Patient Name:  GREGORIA SELVY  Date of Exam:   09/24/2020 Medical Rec #: [id number]      Accession #:    [id number] Date of Birth: January 19, 1941     Patient Gender: F Patient Age:   46 years Exam Location:  [facility] Procedure:      VAS [place] LOWER EXTREMITY VENOUS (DVT) Referring Phys: MURALI RAMASWAMY  --------------------------------------------------------------------------------  Indications: Edema.  Risk Factors: COVID 19 positive. Limitations: Poor ultrasound/tissue interface. Comparison Study: No prior studies. Performing Technologist: Chanda Busing RVT  Examination Guidelines: A complete evaluation includes B-mode imaging, spectral Doppler, color Doppler, and power Doppler as needed of all accessible portions of each vessel. Bilateral testing is considered an integral part of a complete examination. Limited examinations for reoccurring indications may be performed as noted. The reflux portion of the exam is performed with the patient in reverse Trendelenburg.  +---------+---------------+---------+-----------+----------+--------------+ RIGHT    CompressibilityPhasicitySpontaneityPropertiesThrombus Aging +---------+---------------+---------+-----------+----------+--------------+ CFV      Full           Yes  Yes                                 +---------+---------------+---------+-----------+----------+--------------+ SFJ      Full                                                        +---------+---------------+---------+-----------+----------+--------------+ FV Prox  Full                                                        +---------+---------------+---------+-----------+----------+--------------+ FV Mid   Full                                                        +---------+---------------+---------+-----------+----------+--------------+ FV DistalFull                                                        +---------+---------------+---------+-----------+----------+--------------+ PFV      Full                                                        +---------+---------------+---------+-----------+----------+--------------+ POP      Full           Yes      Yes                                  +---------+---------------+---------+-----------+----------+--------------+ PTV      Full                                                        +---------+---------------+---------+-----------+----------+--------------+ PERO     Full                                                        +---------+---------------+---------+-----------+----------+--------------+   +---------+---------------+---------+-----------+----------+--------------+ LEFT     CompressibilityPhasicitySpontaneityPropertiesThrombus Aging +---------+---------------+---------+-----------+----------+--------------+ CFV      Full           Yes      Yes                                 +---------+---------------+---------+-----------+----------+--------------+ SFJ      Full                                                        +---------+---------------+---------+-----------+----------+--------------+  FV Prox  Full                                                        +---------+---------------+---------+-----------+----------+--------------+ FV Mid   Full                                                        +---------+---------------+---------+-----------+----------+--------------+ FV DistalFull                                                        +---------+---------------+---------+-----------+----------+--------------+ PFV      Full                                                        +---------+---------------+---------+-----------+----------+--------------+ POP      Full           Yes      Yes                                 +---------+---------------+---------+-----------+----------+--------------+ PTV      Full                                                        +---------+---------------+---------+-----------+----------+--------------+ PERO     Full                                                         +---------+---------------+---------+-----------+----------+--------------+    Summary: RIGHT: - There is no evidence of deep vein thrombosis in the lower extremity.  - No cystic structure found in the popliteal fossa.  LEFT: - There is no evidence of deep vein thrombosis in the lower extremity.  - No cystic structure found in the popliteal fossa.  *See table(s) above for measurements and observations.    Preliminary    US Abdomen Limited RUQ (LIVER/GB)  Result Date: 09/23/2020 CLINICAL DATA:  80 year old female with abnormal LFTs. EXAM: ULTRASOUND ABDOMEN LIMITED RIGHT UPPER QUADRANT COMPARISON:  CTA chest 09/22/2020. FINDINGS: Gallbladder: Absent as seen on the CTA yesterday. Common bile duct: Diameter: 3 mm, normal. Liver: Echogenic liver (images 14, 18). No discrete liver lesion. Portal vein is patent on color Doppler imaging with normal direction of blood flow towards the liver. Mildly nodular liver contour on some images such (images 29 and 35). Other: Visible right kidney is within normal limits.  No free fluid. IMPRESSION: 1. Hepatic steatosis, with mildly nodular liver contour raising the  possibility of cirrhosis. 2. Absent gallbladder with no evidence of bile duct obstruction. Electronically Signed   By: Genevie Ann M.D.   On: 09/23/2020 06:45    Scheduled Meds:  aspirin EC  81 mg Oral Daily   Chlorhexidine Gluconate Cloth  6 each Topical Daily   guaiFENesin  600 mg Oral BID   ipratropium-albuterol  3 mL Nebulization Q6H   predniSONE  40 mg Oral Q breakfast   Continuous Infusions:  azithromycin 250 mL/hr at 09/24/20 1820   cefTRIAXone (ROCEPHIN)  IV Stopped (09/24/20 1752)   dextrose 5% lactated ringers 50 mL/hr at 09/24/20 1820   diltiazem (CARDIZEM) infusion 10 mg/hr (09/24/20 1820)   heparin 1,100 Units/hr (09/24/20 1820)   lactated ringers      LOS: 2 days   Kerney Elbe, DO [facility] PAGER is on AMION  If 7PM-7AM, please contact night-coverage [email or web address]

## 2020-09-25 ENCOUNTER — Inpatient Hospital Stay (HOSPITAL_COMMUNITY): Payer: Medicare HMO

## 2020-09-25 DIAGNOSIS — I517 Cardiomegaly: Secondary | ICD-10-CM | POA: Diagnosis not present

## 2020-09-25 DIAGNOSIS — R7989 Other specified abnormal findings of blood chemistry: Secondary | ICD-10-CM | POA: Diagnosis not present

## 2020-09-25 DIAGNOSIS — J9620 Acute and chronic respiratory failure, unspecified whether with hypoxia or hypercapnia: Secondary | ICD-10-CM

## 2020-09-25 DIAGNOSIS — I4891 Unspecified atrial fibrillation: Secondary | ICD-10-CM | POA: Diagnosis not present

## 2020-09-25 DIAGNOSIS — I272 Pulmonary hypertension, unspecified: Secondary | ICD-10-CM

## 2020-09-25 DIAGNOSIS — J189 Pneumonia, unspecified organism: Secondary | ICD-10-CM | POA: Diagnosis not present

## 2020-09-25 DIAGNOSIS — J9621 Acute and chronic respiratory failure with hypoxia: Secondary | ICD-10-CM | POA: Diagnosis not present

## 2020-09-25 DIAGNOSIS — I2609 Other pulmonary embolism with acute cor pulmonale: Secondary | ICD-10-CM

## 2020-09-25 DIAGNOSIS — R778 Other specified abnormalities of plasma proteins: Secondary | ICD-10-CM | POA: Diagnosis not present

## 2020-09-25 DIAGNOSIS — J9601 Acute respiratory failure with hypoxia: Secondary | ICD-10-CM | POA: Diagnosis not present

## 2020-09-25 LAB — BLOOD GAS, ARTERIAL
Acid-Base Excess: 5.9 mmol/L — ABNORMAL HIGH (ref 0.0–2.0)
Bicarbonate: 34.2 mmol/L — ABNORMAL HIGH (ref 20.0–28.0)
O2 Saturation: 98.6 %
Patient temperature: 37
pCO2 arterial: 67.6 mmHg (ref 32.0–48.0)
pH, Arterial: 7.325 — ABNORMAL LOW (ref 7.350–7.450)
pO2, Arterial: 130 mmHg — ABNORMAL HIGH (ref 83.0–108.0)

## 2020-09-25 LAB — BASIC METABOLIC PANEL
Anion gap: 5 (ref 5–15)
BUN: 43 mg/dL — ABNORMAL HIGH (ref 8–23)
CO2: 33 mmol/L — ABNORMAL HIGH (ref 22–32)
Calcium: 8.4 mg/dL — ABNORMAL LOW (ref 8.9–10.3)
Chloride: 104 mmol/L (ref 98–111)
Creatinine, Ser: 0.68 mg/dL (ref 0.44–1.00)
GFR, Estimated: >60 mL/min (ref >60)
Glucose, Bld: 178 mg/dL — ABNORMAL HIGH (ref 70–99)
Potassium: 4.6 mmol/L (ref 3.5–5.1)
Sodium: 142 mmol/L (ref 135–145)

## 2020-09-25 LAB — HEPATIC FUNCTION PANEL
ALT: 228 U/L — ABNORMAL HIGH (ref 0–44)
AST: 87 U/L — ABNORMAL HIGH (ref 15–41)
Albumin: 2.7 g/dL — ABNORMAL LOW (ref 3.5–5.0)
Alkaline Phosphatase: 97 U/L (ref 38–126)
Bilirubin, Direct: 0.3 mg/dL — ABNORMAL HIGH (ref 0.0–0.2)
Indirect Bilirubin: 0.4 mg/dL (ref 0.3–0.9)
Total Bilirubin: 0.7 mg/dL (ref 0.3–1.2)
Total Protein: 6.5 g/dL (ref 6.5–8.1)

## 2020-09-25 LAB — PHOSPHORUS: Phosphorus: 3.2 mg/dL (ref 2.5–4.6)

## 2020-09-25 LAB — LEGIONELLA PNEUMOPHILA SEROGP 1 UR AG: L. pneumophila Serogp 1 Ur Ag: NEGATIVE

## 2020-09-25 LAB — CBC
HCT: 49.7 % — ABNORMAL HIGH (ref 36.0–46.0)
Hemoglobin: 15.4 g/dL — ABNORMAL HIGH (ref 12.0–15.0)
MCH: 31.8 pg (ref 26.0–34.0)
MCHC: 31 g/dL (ref 30.0–36.0)
MCV: 102.5 fL — ABNORMAL HIGH (ref 80.0–100.0)
Platelets: 349 10*3/uL (ref 150–400)
RBC: 4.85 MIL/uL (ref 3.87–5.11)
RDW: 14.6 % (ref 11.5–15.5)
WBC: 18.6 10*3/uL — ABNORMAL HIGH (ref 4.0–10.5)
nRBC: 0.2 % (ref 0.0–0.2)

## 2020-09-25 LAB — MAGNESIUM: Magnesium: 2.7 mg/dL — ABNORMAL HIGH (ref 1.7–2.4)

## 2020-09-25 LAB — C-REACTIVE PROTEIN: CRP: 12.3 mg/dL — ABNORMAL HIGH (ref <1.0)

## 2020-09-25 LAB — HEPARIN LEVEL (UNFRACTIONATED): Heparin Unfractionated: 0.47 IU/mL (ref 0.30–0.70)

## 2020-09-25 MED ORDER — ORAL CARE MOUTH RINSE
15.0000 mL | Freq: Two times a day (BID) | OROMUCOSAL | Status: DC
  Administered 2020-09-25 – 2020-09-30 (×10): 15 mL via OROMUCOSAL

## 2020-09-25 MED ORDER — METHYLPREDNISOLONE SODIUM SUCC 125 MG IJ SOLR
60.0000 mg | Freq: Two times a day (BID) | INTRAMUSCULAR | Status: DC
  Administered 2020-09-25 – 2020-09-26 (×2): 60 mg via INTRAVENOUS
  Filled 2020-09-25 (×2): qty 2

## 2020-09-25 MED ORDER — DILTIAZEM HCL ER COATED BEADS 120 MG PO CP24
120.0000 mg | ORAL_CAPSULE | Freq: Every day | ORAL | Status: DC
  Administered 2020-09-25 – 2020-09-29 (×5): 120 mg via ORAL
  Filled 2020-09-25 (×5): qty 1

## 2020-09-25 MED ORDER — APIXABAN 5 MG PO TABS
5.0000 mg | ORAL_TABLET | Freq: Two times a day (BID) | ORAL | Status: DC
  Administered 2020-09-25 – 2020-10-03 (×17): 5 mg via ORAL
  Filled 2020-09-25 (×17): qty 1

## 2020-09-25 MED ORDER — FUROSEMIDE 10 MG/ML IJ SOLN
40.0000 mg | Freq: Once | INTRAMUSCULAR | Status: AC
  Administered 2020-09-25: 40 mg via INTRAVENOUS
  Filled 2020-09-25: qty 4

## 2020-09-25 NOTE — TOC Initial Note (Signed)
Transition of Care [facility]) - Initial/Assessment Note    Patient Details  Name: Kimberly Ballard MRN: [id number] Date of Birth: 10-28-1940  [facility] CM/SW Contact:    Golda Acre, RN Phone Number: 09/25/2020, 8:23 AM  Clinical Narrative:                 80 year old female with history of COPD presented with shortness of breath, body aches, decreased appetite and coughing.  Patients daughter was diagnosed with COVID last week.  She was started on Paxilovid empirically due to risk of exposure.  Patient continued to get worse and developed shortness of breath.  On EMS arrival she required 6 L/min of oxygen.  In the ED SARS-CoV-2 RT-PCR was negative.  Chest x-ray showed bilateral infiltrates, CTA was negative for PE but showed evidence of bilateral infiltrates.  Patient started on Rocephin and Zithromax for community-acquired pneumonia.  Subsequently she decompensated and worsened and went into A. fib with RVR and had to be placed on BiPAP and she is subsequently moved to the stepdown unit.  We are empirically treating for COVID and pulmonary has been consulted as well as cardiology.  Cardiology started the patient on Cardizem drip and pulmonary recommending continuing steroids     Assessment & Plan:   Active Problems:   CAP (community acquired pneumonia)   Acute respiratory failure with hypoxia and hypercapnia (HCC)   Sepsis (HCC)   Elevated LFTs   Cardiomegaly   Elevated troponin   Atrial fibrillation (HCC)   Acute respiratory failure with hypoxia requiring noninvasive positive pressure ventilation with BiPAP 12/6 60% FiO2 -Multifactorial from underlying multifocal pneumonia, diastolic CHF -Patient is currently on Rocephin and Zithromax and will continue -Follow blood culture results -She was given 1 dose of Lasix 40 mg IV yesterday and will give another dose today -CTA of the Chest PE protocol done and showed "No evidence of acute pulmonary embolism. Patchy  consolidative opacities in the lung bases and  lingula have slightly worsened compared to the prior study and remain concerning for multifocal pneumonia, with differential including COVID. As before, follow-up chest CT in 3 months is recommended to assess for resolution. Unchanged cardiomegaly with evidence of right heart failure and pulmonary hypertension as above. Coronary artery calcifications and Aortic Atherosclerosis (ICD10-I70.0). -SpO2: 92 % O2 Flow Rate (L/min): 15 L/min FiO2 (%): 60 % -Inflammatory Markers TOC PLAN OF CARE: lives alone, may need home o2 and or hhc will follow for these needs, following for progression of care. Expected Discharge Plan: Home/Self Care Barriers to Discharge: Continued Medical Work up   Patient Goals and CMS Choice        Expected Discharge Plan and Services Expected Discharge Plan: Home/Self Care   Discharge Planning Services: CM Consult   Living arrangements for the past 2 months: Single Family Home                                      Prior Living Arrangements/Services Living arrangements for the past 2 months: Single Family Home Lives with:: Self Patient language and need for interpreter reviewed:: Yes              Criminal Activity/Legal Involvement Pertinent to Current Situation/Hospitalization: No - Comment as needed  Activities of Daily Living Home Assistive Devices/Equipment: Eyeglasses, Dentures (specify type) (full set dentures) ADL Screening (condition at time of admission) Patient's cognitive ability adequate to safely  complete daily activities?: Yes Is the patient deaf or have difficulty hearing?: Yes Does the patient have difficulty seeing, even when wearing glasses/contacts?: No Does the patient have difficulty concentrating, remembering, or making decisions?: No Patient able to express need for assistance with ADLs?: Yes Does the patient have difficulty dressing or bathing?: Yes Independently performs ADLs?:  No Communication: Independent Dressing (OT): Needs assistance Is this a change from baseline?: Change from baseline, expected to last <3days Grooming: Independent Feeding: Independent Bathing: Needs assistance Is this a change from baseline?: Change from baseline, expected to last <3 days Toileting: Needs assistance Is this a change from baseline?: Change from baseline, expected to last <3 days In/Out Bed: Needs assistance Is this a change from baseline?: Change from baseline, expected to last <3 days Walks in Home: Needs assistance Is this a change from baseline?: Change from baseline, expected to last <3 days Does the patient have difficulty walking or climbing stairs?: Yes Weakness of Legs: Both Weakness of Arms/Hands: Both  Permission Sought/Granted                  Emotional Assessment Appearance:: Appears stated age     Orientation: : Oriented to Self, Oriented to Place, Oriented to  Time, Oriented to Situation Alcohol / Substance Use: Not Applicable Psych Involvement: No (comment)  Admission diagnosis:  CAP (community acquired pneumonia) [J18.9] Dyspnea, unspecified type [R06.00] Patient Active Problem List   Diagnosis Date Noted   Atrial fibrillation (HCC)    Elevated troponin 09/23/2020   CAP (community acquired pneumonia) 09/22/2020   Acute respiratory failure with hypoxia and hypercapnia (HCC) 09/22/2020   Sepsis (HCC) 09/22/2020   Elevated LFTs 09/22/2020   Cardiomegaly 09/22/2020   PCP:  System, Provider Not In Pharmacy:   [id number] - [place], [place] - [address] AT [address] OF [facility] [address] [place] [address] Phone: [phone] Fax: [phone]     Social Determinants of Health (SDOH) Interventions    Readmission Risk Interventions No flowsheet data found.

## 2020-09-25 NOTE — Progress Notes (Addendum)
Progress Note  Patient Name: Kimberly Ballard Date of Encounter: 09/25/2020  [facility] Cardiologist: None   Subjective   Currently O2 sats 94% on 15L NRBM.  Remains in atrial fibrillation with CVR.   Inpatient Medications    Scheduled Meds:  aspirin EC  81 mg Oral Daily   Chlorhexidine Gluconate Cloth  6 each Topical Daily   guaiFENesin  600 mg Oral BID   ipratropium-albuterol  3 mL Nebulization Q6H   mouth rinse  15 mL Mouth Rinse BID   predniSONE  40 mg Oral Q breakfast   Continuous Infusions:  azithromycin Stopped (09/24/20 1915)   cefTRIAXone (ROCEPHIN)  IV Stopped (09/24/20 1752)   dextrose 5% lactated ringers 50 mL/hr at 09/25/20 1132   diltiazem (CARDIZEM) infusion 5 mg/hr (09/25/20 0733)   heparin 1,300 Units/hr (09/25/20 0733)   PRN Meds: albuterol   Vital Signs    Vitals:   09/25/20 0600 09/25/20 0700 09/25/20 0900 09/25/20 1009  BP: 120/70 134/66 133/73 108/61  Pulse: 85 82 99 75  Resp: (!) 21 (!) 25 (!) 22 (!) 21  Temp:      TempSrc:      SpO2: 96% 94% 94% 94%  Weight:      Height:        Intake/Output Summary (Last 24 hours) at 09/25/2020 1154 Last data filed at 09/25/2020 1308 Gross per 24 hour  Intake 1954.09 ml  Output 650 ml  Net 1304.09 ml    Last 3 Weights 09/22/2020  Weight (lbs) 200 lb 6.4 oz  Weight (kg) 90.901 kg      Telemetry    Atrial fibrillation with CVR - Personally Reviewed  ECG    No new EKG to review - Personally Reviewed  Physical Exam   GEN: ill appearing HEENT: Normal NECK: No JVD; No carotid bruits LYMPHATICS: No lymphadenopathy CARDIAC:irregularly irregular, no murmurs, rubs, gallops RESPIRATORY:  Clear to auscultation without rales, wheezing or rhonchi  ABDOMEN: Soft, non-tender, non-distended MUSCULOSKELETAL:  No edema; No deformity  SKIN: Warm and dry NEUROLOGIC:  Alert and oriented x 3 PSYCHIATRIC:  Normal affect   Labs    High Sensitivity Troponin:   Recent Labs  Lab 09/22/20 2148  09/23/20 0207 09/24/20 0239  TROPONINIHS 128* 100* 52*       Chemistry Recent Labs  Lab 09/23/20 0207 09/24/20 0323 09/25/20 0447 09/25/20 0838  NA 139 141 142  --   K 3.9 3.6 4.6  --   CL 102 99 104  --   CO2 25 30 33*  --   GLUCOSE 141* 187* 178*  --   BUN 69* 58* 43*  --   CREATININE 1.04* 0.75 0.68  --   CALCIUM 8.3* 8.3* 8.4*  --   PROT 6.8 7.0  --  6.5  ALBUMIN 3.1* 2.9*  --  2.7*  AST 170* 91*  --  87*  ALT 327* 264*  --  228*  ALKPHOS 97 112  --  97  BILITOT 1.1 0.8  --  0.7  GFRNONAA 55* >60 >60  --   ANIONGAP 12 12 5   --       Hematology Recent Labs  Lab 09/23/20 0207 09/24/20 0323 09/25/20 0447  WBC 19.7* 16.5* 18.6*  RBC 4.91 4.90 4.85  HGB 15.7* 15.4* 15.4*  HCT 49.4* 49.4* 49.7*  MCV 100.6* 100.8* 102.5*  MCH 32.0 31.4 31.8  MCHC 31.8 31.2 31.0  RDW 14.7 14.5 14.6  PLT 253 318 349  BNP Recent Labs  Lab 09/22/20 2148  BNP 889.7*      DDimer  Recent Labs  Lab 09/22/20 1441 09/23/20 1844  DDIMER 1.97* 2.04*      CHA2DS2-VASc Score = 3  This indicates a 3.2% annual risk of stroke. The patient's score is based upon: CHF History: No HTN History: No Diabetes History: No Stroke History: No Vascular Disease History: No Age Score: 2 Gender Score: 1    Radiology    CT Angio Chest Pulmonary Embolism (PE) W or WO Contrast  Result Date: 09/24/2020 CLINICAL DATA:  Shortness of breath, body aches, cough, elevated D-dimer EXAM: CT ANGIOGRAPHY CHEST WITH CONTRAST TECHNIQUE: Multidetector CT imaging of the chest was performed using the standard protocol during bolus administration of intravenous contrast. Multiplanar CT image reconstructions and MIPs were obtained to evaluate the vascular anatomy. CONTRAST:  80mL OMNIPAQUE IOHEXOL 350 MG/ML SOLN COMPARISON:  CTA chest 2 days prior FINDINGS: Cardiovascular: Satisfactory opacification of the pulmonary arteries to the segmental level. There is no evidence of acute or chronic pulmonary  embolism. The heart is enlarged. Coronary artery calcifications and calcified atherosclerotic plaque of the aortic arch are again seen. There is no pericardial effusion. The main pulmonary artery is significantly enlarged measuring up to 4.0 cm. There is reflux of contrast into the IVC and hepatic veins, also seen on the prior study. Mediastinum/Nodes: The imaged thyroid is unremarkable. A 1.0 cm precarinal lymph node and 1.0 cm right hilar lymph node are unchanged, nonspecific and possibly reactive. There is no other mediastinal or hilar lymphadenopathy. There is no axillary lymphadenopathy. Lungs/Pleura: The trachea and central airways are patent. Bilateral bronchial wall thickening is similar to the prior study. There is a background of mild-to-moderate centrilobular emphysema. There are consolidative opacities in both lung bases and peripheral opacity in the lingula, slightly worsened compared to the prior study. Some of the opacities again have a nodular appearance (for example in the superior segment of the left lower lobe measuring up to 8 mm, 4-34). There is no pleural effusion or pneumothorax. Calcified pleural plaque along the right base is unchanged, likely related to prior asbestos exposure. Upper Abdomen: The imaged portions of the upper abdominal viscera are unremarkable. Musculoskeletal: Deformity of the right anterior fourth through sixth ribs is likely related to prior trauma. There is multilevel degenerative change of the thoracic spine with mild anterior wedge deformity of the T3 vertebral body, unchanged. There is no acute osseous abnormality. Review of the MIP images confirms the above findings. IMPRESSION: 1. No evidence of acute pulmonary embolism. 2. Patchy consolidative opacities in the lung bases and lingula have slightly worsened compared to the prior study and remain concerning for multifocal pneumonia, with differential including COVID. As before, follow-up chest CT in 3 months is  recommended to assess for resolution. 3. Unchanged cardiomegaly with evidence of right heart failure and pulmonary hypertension as above. 4. Coronary artery calcifications and Aortic Atherosclerosis (ICD10-I70.0). Electronically Signed   By: Lesia HausenPeter  Noone M.D.   On: 09/24/2020 14:44   ECHOCARDIOGRAM COMPLETE  Result Date: 09/23/2020    ECHOCARDIOGRAM REPORT   Patient Name:   Herschell DimesNDREA J Dever Date of Exam: 09/23/2020 Medical Rec #:  [id number]     Height:       63.0 in Accession #:    [id number]    Weight:       200.4 lb Date of Birth:  11-18-1940    BSA:          1.935 m  Patient Age:    79 years      BP:           122/76 mmHg Patient Gender: F             HR:           76 bpm. Exam Location:  Inpatient Procedure: 2D Echo, 3D Echo, Cardiac Doppler and Color Doppler Indications:    I51.7 Cardiomegaly  History:        Patient has no prior history of Echocardiogram examinations.                 Cardiomegaly, COPD; Signs/Symptoms:Shortness of Breath and                 Dyspnea.  Sonographer:    Sheralyn Boatman RDCS Referring Phys: [id number] ANASTASSIA DOUTOVA  Sonographer Comments: Technically difficult study due to poor echo windows. Image acquisition challenging due to patient body habitus. IMPRESSIONS  1. Left ventricular ejection fraction, by estimation, is 55 to 60%. Left ventricular ejection fraction by 3D volume is 59 %. The left ventricle has normal function. The left ventricle has no regional wall motion abnormalities. There is moderate left ventricular hypertrophy. Left ventricular diastolic parameters are consistent with Grade I diastolic dysfunction (impaired relaxation).  2. Right ventricular systolic function mildly reduced at base and mid, with relative preserved apical systolic function. The right ventricular size is moderately enlarged. There is moderately elevated pulmonary artery systolic pressure. The estimated right ventricular systolic pressure is 48.4 mmHg.  3. Left atrial size was mildly dilated.  4. Right  atrial size was moderately dilated.  5. The mitral valve is normal in structure. Trivial mitral valve regurgitation. No evidence of mitral stenosis.  6. Tricuspid valve regurgitation is mild to moderate.  7. The aortic valve is normal in structure. Aortic valve regurgitation is not visualized. No aortic stenosis is present.  8. The inferior vena cava is dilated in size with <50% respiratory variability, suggesting right atrial pressure of 15 mmHg. FINDINGS  Left Ventricle: Left ventricular ejection fraction, by estimation, is 55 to 60%. Left ventricular ejection fraction by 3D volume is 59 %. The left ventricle has normal function. The left ventricle has no regional wall motion abnormalities. The left ventricular internal cavity size was normal in size. There is moderate left ventricular hypertrophy. Left ventricular diastolic parameters are consistent with Grade I diastolic dysfunction (impaired relaxation). Right Ventricle: Calcified moderate band. The right ventricular size is moderately enlarged. No increase in right ventricular wall thickness. Right ventricular systolic function mildly reduced at base and mid, with relative preserved apical systolic function. There is moderately elevated pulmonary artery systolic pressure. The tricuspid regurgitant velocity is 2.89 m/s, and with an assumed right atrial pressure of 15 mmHg, the estimated right ventricular systolic pressure is 48.4 mmHg. Left Atrium: Left atrial size was mildly dilated. Right Atrium: Right atrial size was moderately dilated. Pericardium: There is no evidence of pericardial effusion. Presence of pericardial fat pad. Mitral Valve: The mitral valve is normal in structure. Mild mitral annular calcification. Trivial mitral valve regurgitation. No evidence of mitral valve stenosis. Tricuspid Valve: The tricuspid valve is normal in structure. Tricuspid valve regurgitation is mild to moderate. No evidence of tricuspid stenosis. Aortic Valve: The aortic  valve is normal in structure. Aortic valve regurgitation is not visualized. No aortic stenosis is present. Pulmonic Valve: The pulmonic valve was normal in structure. Pulmonic valve regurgitation is trivial. No evidence of pulmonic stenosis. Aorta: Aorta dimension upper limit  of normal for age and BSA. The aortic root is normal in size and structure. Venous: The inferior vena cava is dilated in size with less than 50% respiratory variability, suggesting right atrial pressure of 15 mmHg. IAS/Shunts: The interatrial septum appears to be lipomatous. No atrial level shunt detected by color flow Doppler.  LEFT VENTRICLE PLAX 2D LVIDd:         3.60 cm         Diastology LVIDs:         2.50 cm         LV e' medial:    6.31 cm/s LV PW:         1.30 cm         LV E/e' medial:  9.0 LV IVS:        1.30 cm         LV e' lateral:   10.20 cm/s LVOT diam:     2.00 cm         LV E/e' lateral: 5.6 LV SV:         54 LV SV Index:   28 LVOT Area:     3.14 cm        3D Volume EF                                LV 3D EF:    Left                                             ventricul LV Volumes (MOD)                            ar LV vol d, MOD    73.9 ml                    ejection A2C:                                        fraction LV vol d, MOD    53.3 ml                    by 3D A4C:                                        volume is LV vol s, MOD    28.2 ml                    59 %. A2C: LV vol s, MOD    20.1 ml A4C:                           3D Volume EF: LV SV MOD A2C:   45.7 ml       3D EF:        59 % LV SV MOD A4C:   53.3 ml LV SV MOD BP:    38.1 ml RIGHT VENTRICLE             IVC RV S prime:  10.60 cm/s  IVC diam: 2.90 cm TAPSE (M-mode): 1.8 cm LEFT ATRIUM             Index       RIGHT ATRIUM           Index LA diam:        3.90 cm 2.02 cm/m  RA Area:     25.80 cm LA Vol (A2C):   33.2 ml 17.16 ml/m RA Volume:   96.90 ml  50.07 ml/m LA Vol (A4C):   54.8 ml 28.32 ml/m LA Biplane Vol: 43.4 ml 22.43 ml/m  AORTIC VALVE              PULMONIC VALVE LVOT Vmax:   95.70 cm/s  PR End Diast Vel: 1.35 msec LVOT Vmean:  61.300 cm/s LVOT VTI:    0.172 m  AORTA Ao Root diam: 3.70 cm Ao Asc diam:  3.80 cm MITRAL VALVE               TRICUSPID VALVE MV Area (PHT): 2.95 cm    TV Peak grad:   34.6 mmHg MV Decel Time: 257 msec    TV Vmax:        2.94 m/s MV E velocity: 56.80 cm/s  TR Peak grad:   33.4 mmHg MV A velocity: 82.10 cm/s  TR Vmax:        289.00 cm/s MV E/A ratio:  0.69                            SHUNTS                            Systemic VTI:  0.17 m                            Systemic Diam: 2.00 cm Weston Brass MD Electronically signed by Weston Brass MD Signature Date/Time: 09/23/2020/4:25:50 PM    Final    VAS [place] LOWER EXTREMITY VENOUS (DVT)  Result Date: 09/24/2020  Lower Venous DVT Study Patient Name:  MEERAB MASELLI  Date of Exam:   09/24/2020 Medical Rec #: [id number]      Accession #:    [id number] Date of Birth: [birth year]     Patient Gender: F Patient Age:   89 years Exam Location:  [facility] Procedure:      VAS [place] LOWER EXTREMITY VENOUS (DVT) Referring Phys: MURALI RAMASWAMY --------------------------------------------------------------------------------  Indications: Edema.  Risk Factors: COVID 19 positive. Limitations: Poor ultrasound/tissue interface. Comparison Study: No prior studies. Performing Technologist: Chanda Busing RVT  Examination Guidelines: A complete evaluation includes B-mode imaging, spectral Doppler, color Doppler, and power Doppler as needed of all accessible portions of each vessel. Bilateral testing is considered an integral part of a complete examination. Limited examinations for reoccurring indications may be performed as noted. The reflux portion of the exam is performed with the patient in reverse Trendelenburg.  +---------+---------------+---------+-----------+----------+--------------+ RIGHT    CompressibilityPhasicitySpontaneityPropertiesThrombus Aging  +---------+---------------+---------+-----------+----------+--------------+ CFV      Full           Yes      Yes                                 +---------+---------------+---------+-----------+----------+--------------+ SFJ      Full                                                        +---------+---------------+---------+-----------+----------+--------------+  FV Prox  Full                                                        +---------+---------------+---------+-----------+----------+--------------+ FV Mid   Full                                                        +---------+---------------+---------+-----------+----------+--------------+ FV DistalFull                                                        +---------+---------------+---------+-----------+----------+--------------+ PFV      Full                                                        +---------+---------------+---------+-----------+----------+--------------+ POP      Full           Yes      Yes                                 +---------+---------------+---------+-----------+----------+--------------+ PTV      Full                                                        +---------+---------------+---------+-----------+----------+--------------+ PERO     Full                                                        +---------+---------------+---------+-----------+----------+--------------+   +---------+---------------+---------+-----------+----------+--------------+ LEFT     CompressibilityPhasicitySpontaneityPropertiesThrombus Aging +---------+---------------+---------+-----------+----------+--------------+ CFV      Full           Yes      Yes                                 +---------+---------------+---------+-----------+----------+--------------+ SFJ      Full                                                         +---------+---------------+---------+-----------+----------+--------------+ FV Prox  Full                                                        +---------+---------------+---------+-----------+----------+--------------+  FV Mid   Full                                                        +---------+---------------+---------+-----------+----------+--------------+ FV DistalFull                                                        +---------+---------------+---------+-----------+----------+--------------+ PFV      Full                                                        +---------+---------------+---------+-----------+----------+--------------+ POP      Full           Yes      Yes                                 +---------+---------------+---------+-----------+----------+--------------+ PTV      Full                                                        +---------+---------------+---------+-----------+----------+--------------+ PERO     Full                                                        +---------+---------------+---------+-----------+----------+--------------+     Summary: RIGHT: - There is no evidence of deep vein thrombosis in the lower extremity.  - No cystic structure found in the popliteal fossa.  LEFT: - There is no evidence of deep vein thrombosis in the lower extremity.  - No cystic structure found in the popliteal fossa.  *See table(s) above for measurements and observations. Electronically signed by Coral Else MD on 09/24/2020 at 8:07:15 PM.    Final     Cardiac Studies   2D echo 09/23/2020 IMPRESSIONS    1. Left ventricular ejection fraction, by estimation, is 55 to 60%. Left  ventricular ejection fraction by 3D volume is 59 %. The left ventricle has  normal function. The left ventricle has no regional wall motion  abnormalities. There is moderate left  ventricular hypertrophy. Left ventricular diastolic parameters are  consistent  with Grade I diastolic dysfunction (impaired relaxation).   2. Right ventricular systolic function mildly reduced at base and mid,  with relative preserved apical systolic function. The right ventricular  size is moderately enlarged. There is moderately elevated pulmonary artery  systolic pressure. The estimated  right ventricular systolic pressure is 48.4 mmHg.   3. Left atrial size was mildly dilated.   4. Right atrial size was moderately dilated.   5. The mitral valve is normal in structure. Trivial mitral valve  regurgitation. No evidence of  mitral stenosis.   6. Tricuspid valve regurgitation is mild to moderate.   7. The aortic valve is normal in structure. Aortic valve regurgitation is  not visualized. No aortic stenosis is present.   8. The inferior vena cava is dilated in size with <50% respiratory  variability, suggesting right atrial pressure of 15 mmHg.   Patient Profile     80 y.o. female with a hx of COPD with prior tobacco smoking, quit in 2019 who is being seen for evaluation of elevated troponin at the request of Dr. Adela Glimpse.  Assessment & Plan    Elevated Troponin/RV dysfunction/Pulmonary HTN -hsTrop 12>100 -EKG on admit with anterior T wave abnormality and unfortunately we do not have old EKG to compare -with acute hypoxemia in setting of PNA and COPD exacerbation this enzyme leak may be related to demand ischemia -Anterior T wave changes are concerning but could be due to acute RV strain from hypoxemia in setting of multifocal PNA -2D echo showed normal LVF with EF 59% but moderately enlarged RV with mildly reduced RVF at the base and mid and relative sparing of apical RVF which can be seen in acute PE but not specific for acute PE -Chest CT with no evidence of acute PE on 8/22 and repeat Chest CTA pending for elevated ddimer -she has COPD with acute exacerbation and multifocal PNA>>suspect that hypoxemia is resulting in acute RV strain with RV dilatation and RV  dysfunction -she has moderate pulmonary HTN on echo likely related to underlying COPD with acute exacerbation in setting of hypoxemia -I do not think her Trop elevation is related to ACS and more consistent with demand ischemia in the setting of acute respiratory decompensation -Findings consistent with pulmonary HTN and 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 and worsened in the setting of acute COPD exacerbation and PNA -need to treat underlying respiratory illness and may benefit from right heart cath prior to discharge -BNP is elevated but may be related to underlying respiratory issues -Given Lasix 40mg  IV yesterday and put out 1L and is net + 1.8L -no weights recorded -SCr stable at 0.68 and K+ 4.6 -continue Lasix 40mg  IV  -follow I&O's, daily weights and renal function while diuresing   Atrial fibrillation with RVR -remains in afib but now with CVR on IV Cardizem gtt -change to Cardizem CD 180mg  daily -would like to avoid Amio in setting of acute respiratory decompensation and chronic COPD --CHA2DS2-VASc Score = 3  This indicates a 3.2% annual risk of stroke. The patient's score is based upon: CHF History: No HTN History: No Diabetes History: No Stroke History: No Vascular Disease History: No Age Score: 2 Gender Score: 1 -change  IV Heparin to Eliquis 5mg  BID  -d/c ASA  The patient is critically ill with multiple organ systems failure and requires high complexity decision making for assessment and support, frequent evaluation and titration of therapies, application of advanced monitoring technologies and extensive interpretation of multiple databases. Critical Care Time devoted to patient care services described in this note independent of APP time is 60 minutes with >50% of time spent in direct patient care.         For questions or updates, please contact [facility] Please consult [email or web address] for contact info under        Signed, , MD  09/25/2020,  11:54 AM

## 2020-09-25 NOTE — Plan of Care (Signed)
?  Problem: Nutrition: ?Goal: Adequate nutrition will be maintained ?Outcome: Not Progressing ?  ?Problem: Pain Managment: ?Goal: General experience of comfort will improve ?Outcome: Progressing ?  ?Problem: Safety: ?Goal: Ability to remain free from injury will improve ?Outcome: Progressing ?  ?

## 2020-09-25 NOTE — Progress Notes (Signed)
NAME:  Kimberly Ballard, MRN:  [id number], DOB:  1940/06/26, LOS: 3 ADMISSION DATE:  09/22/2020, CONSULTATION DATE:  09/23/20 REFERRING MD:  Adela Glimpse CHIEF COMPLAINT: Dyspnea, cough   History of Present Illness:  Kimberly Ballard is a 80 y.o. female who presented to [facility] ED on 8/22 with dyspnea, body aches, cough.  Her daughter had apparently tested positive for COVID 1 week earlier.  She saw her PCP and was empirically started on Paxlovid due to her exposure of which she took 2 doses.  She is not vaccinated for COVID. Symptoms progressed to the point that she felt she needed to seek medical attention; therefore, she called EMS. -On EMS arrival, she was found to be hypoxic in the 70s she was subsequently placed on 6 L of oxygen with improvement in sats to 94%. -In ED, she was found to have leukocytosis, hypoxic and hypercapnic respiratory failure, and probable community-acquired pneumonia on imaging. -She was admitted by [facility] and on 8/23, PCCM asked to see in consultation.   Pertinent  Medical History:  has CAP (community acquired pneumonia); Acute respiratory failure with hypoxia and hypercapnia (HCC); Sepsis (HCC); Elevated LFTs; Cardiomegaly; Elevated troponin; and Atrial fibrillation (HCC) on their problem list.  She is a former smoker.  Smoked around 1ppd and quit some 5 - 6 years ago.  She has never seen a pulmonologist and / or had PFT's performed.  Significant Hospital Events: Including procedures, antibiotic start and stop dates in addition to other pertinent events   8/22 > admit.  Admitted to stepdown unit.  Cough weak.  Pulmonary asked to evaluate given concern for progressive respiratory failure.  Initial COVID by respiratory panel was negative.  As was influenza.  Respiratory viral panel negative.  Still placed on isolation protocol given history.  IV ceftriaxone and azithromycin initiated.  Given IV Solu-Medrol followed by oral prednisone order. 8/23 pulmonary asked to evaluate given  worsening respiratory failure.  CT chest showing lower lobe consolidation.  There was concern about possible aspiration.  Echo obtained showed good left ventricular function with mild pulmonary artery hypertension. 8/24 cardiology consulted for elevated troponins.  This was felt secondary to worsening hypoxia and underlying pulmonary artery hypertension which was felt previously undiagnosed issue.  New onset atrial fibrillation with RVR.  CT angiogram ordered.  Increased oxygen requirement overnight, requiring BiPAP, later placed on 15 L/min via high flow.  Lower extremity ultrasound negative for DVT 8/25: u strep neg.  still high FIO2. F/u COVID neg. Transitioned to oral CCB and DOAC. Getting IV lasix    Interim History / Subjective:  Still on high flow FM  Objective:  Blood pressure 108/61, pulse 75, temperature 97.7 F (36.5 C), temperature source Oral, resp. rate (Abnormal) 21, height 5\' 3"  (1.6 m), weight 90.9 kg, SpO2 94 %.    FiO2 (%):  [60 %-100 %] 100 %   Intake/Output Summary (Last 24 hours) at 09/25/2020 1256 Last data filed at 09/25/2020 1100 Gross per 24 hour  Intake 2188.52 ml  Output 650 ml  Net 1538.52 ml   Filed Weights   09/22/20 2020  Weight: 90.9 kg    Examination: General 80 year old female. Resting in bed. Still on high FIO2 Hent NCAT no JVD  Pulm dec bases. No accessory use currently but does get SOB w/ exertion Card af w/ CRV currently Abd soft not tender Ext warm and dry  Neuro intact   Labs/imaging personally reviewed:  CTA chest 8/22 > multifocal PNA, probable reactive  bilateral hilar lymph nodes, cardiomegaly, PAH, possible nodule.  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 -Did have recent COVID exposure, current respiratory viral panel negative, as was initial COVID testing however certainly high enough risk  of false negative -Oxygen requirements about the same. 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 -Seen by cardiology felt likely acute on chronic with acute hypoxia worsening current picture -Troponins are trending down Plan Tele Defer additional eval to cards  Atrial fibrillation with rapid ventricular response Seen by cardiology, CHA2DS2-VASc score 3 Plan Transition to oral CCB Transition to DOAC Cont tele   Steroid-induced hyperglycemia Plan Ssi   RML nodule Plan Repeat CT scan 3 mo.     Best practice (evaluated daily):  Per primary team. My cct 32 min   Simonne Martinet ACNP-BC [facility] Pulmonary/Critical Care Pager # [phone] OR # [phone] if no answer

## 2020-09-25 NOTE — Evaluation (Signed)
Occupational Therapy Evaluation Patient Details Name: Kimberly Ballard MRN: [id number] DOB: Jun 27, 1940 Today's Date: 09/25/2020    History of Present Illness Patient is a 80 year old female presented with shortness of breath, body aches, decreased appetite and coughing. Per DTR patient diagnosedw ith COVID week previous to admission. PMH includes COPD   Clinical Impression   Patient reports typically independent with self care, does not use an AD. Recently her partner passed away and plans to sell her house so she can move to be with daughter in [place]. Patient presenting with compromised cardiopulmonary status impacting overall endurance and strength. Patient needing mod A x1-2 for trunk support to sit upright at edge of bed. O2 remaining stable while patient wash face at set up level. With min A x1-2 patient able to stand, provide hand held assist but after 1-2 side steps sat quickly back onto bed with what appeared loss of balance. Patient also reporting fatigue "I was having trouble with this before I came here." VSS 92-96% on 15L NRB HR between 90-114. Due to current functional impairments would recommend rehab at D/C, acute OT to follow.     Follow Up Recommendations  SNF    Equipment Recommendations  Other (comment) (defer next venue)       Precautions / Restrictions Precautions Precautions: Fall Precaution Comments: monitor vitals; currently on 15L NRB Restrictions Weight Bearing Restrictions: No      Mobility Bed Mobility Overal bed mobility: Needs Assistance Bed Mobility: Supine to Sit;Sit to Supine     Supine to sit: Mod assist;HOB elevated;+2 for safety/equipment;+2 for physical assistance Sit to supine: Mod assist   General bed mobility comments: patient needing mod A to elevate trunk to sitting position +1-2 assist. assist to bring legs back onto bed at end of session    Transfers Overall transfer level: Needs assistance Equipment used: 2 person hand held  assist Transfers: Sit to/from Stand Sit to Stand: Min assist;+2 physical assistance;+2 safety/equipment         General transfer comment: min A x1-2 to power up to standing. fatigues quickly and appears to have mild loss of balance sitting quickly back onto bed after 1-2 side steps to [facility]    Balance Overall balance assessment: Needs assistance Sitting-balance support: Feet unsupported Sitting balance-Leahy Scale: Fair     Standing balance support: Single extremity supported Standing balance-Leahy Scale: Poor Standing balance comment: reliant on external support                           ADL either performed or assessed with clinical judgement   ADL Overall ADL's : Needs assistance/impaired     Grooming: Wash/dry face;Set up;Sitting Grooming Details (indicate cue type and reason): O2 maintained at 92-94% with brief room air while washing face and removal of NRB Upper Body Bathing: Min guard;Sitting   Lower Body Bathing: Maximal assistance;Sitting/lateral leans;Sit to/from stand Lower Body Bathing Details (indicate cue type and reason): decreased activity tolerance and standing balance Upper Body Dressing : Min guard;Sitting   Lower Body Dressing: Total assistance;Bed level;Sitting/lateral leans;Sit to/from stand Lower Body Dressing Details (indicate cue type and reason): to don socks semi-supine, limited standing tolerance and balance Toilet Transfer: Minimal assistance;+2 for safety/equipment;+2 for physical assistance Toilet Transfer Details (indicate cue type and reason): patient able to power up to standing with min A x1-2 for safety. Provided hand held assist and instructed to side step to head of bed. Patient took 1-2 steps before  sitting quickly back onto edge of bed. Poor standing tolerance and balance Toileting- Clothing Manipulation and Hygiene: Total assistance       Functional mobility during ADLs: Minimal assistance;+2 for physical assistance;+2 for  safety/equipment General ADL Comments: patient requiring increased assistance with all self care tasks due to cardiopulmonary status, decreased endurance, strength, balance                         Pertinent Vitals/Pain Pain Assessment: Faces Faces Pain Scale: No hurt     Hand Dominance  (did not specify)   Extremity/Trunk Assessment Upper Extremity Assessment Upper Extremity Assessment: Generalized weakness   Lower Extremity Assessment Lower Extremity Assessment: Defer to PT evaluation       Communication Communication Communication: No difficulties   Cognition Arousal/Alertness: Awake/alert Behavior During Therapy: WFL for tasks assessed/performed Overall Cognitive Status: Within Functional Limits for tasks assessed                                     General Comments  VSS on 15L NRB            Home Living Family/patient expects to be discharged to:: Skilled nursing facility Living Arrangements: Alone                               Additional Comments: patient was living alone partner recently passed away, plans to go live with DTR in [place]. Has a son that lives locally      Prior Functioning/Environment Level of Independence: Independent        Comments: typcially does not use AD but has walker + cane at home. reports for ~1 week prior to admission was getting harder to get around        OT Problem List: Decreased strength;Decreased activity tolerance;Impaired balance (sitting and/or standing);Decreased safety awareness;Decreased knowledge of use of DME or AE;Cardiopulmonary status limiting activity;Obesity      OT Treatment/Interventions: Self-care/ADL training;Therapeutic exercise;Energy conservation;DME and/or AE instruction;Therapeutic activities;Patient/family education;Balance training    OT Goals(Current goals can be found in the care plan section) Acute Rehab OT Goals Patient Stated Goal: feel better OT Goal  Formulation: With patient Time For Goal Achievement: 10/09/20 Potential to Achieve Goals: Good  OT Frequency: Min 2X/week               Co-evaluation PT/OT/SLP Co-Evaluation/Treatment: Yes Reason for Co-Treatment: Complexity of the patient's impairments (multi-system involvement);For patient/therapist safety;To address functional/ADL transfers PT goals addressed during session: Mobility/safety with mobility OT goals addressed during session: ADL's and self-care      AM-PAC OT "6 Clicks" Daily Activity     Outcome Measure Help from another person eating meals?: A Little Help from another person taking care of personal grooming?: A Little Help from another person toileting, which includes using toliet, bedpan, or urinal?: Total Help from another person bathing (including washing, rinsing, drying)?: A Lot Help from another person to put on and taking off regular upper body clothing?: A Little Help from another person to put on and taking off regular lower body clothing?: Total 6 Click Score: 13   End of Session Equipment Utilized During Treatment: Oxygen Nurse Communication: Mobility status  Activity Tolerance: Patient tolerated treatment well;Patient limited by fatigue Patient left: in bed;with call bell/phone within reach;with bed alarm set  OT Visit Diagnosis: Unsteadiness on feet (  R26.81);Other abnormalities of gait and mobility (R26.89);Muscle weakness (generalized) (M62.81)                Time: 3295-1884 OT Time Calculation (min): 30 min Charges:  OT General Charges $OT Visit: 1 Visit OT Evaluation $OT Eval Moderate Complexity: 1 Mod  [name] OT OT pager: [phone]  [name] 09/25/2020, 11:26 AM

## 2020-09-25 NOTE — Progress Notes (Signed)
Airborne precautions removed per Dr. Marchelle Gearing

## 2020-09-25 NOTE — Progress Notes (Signed)
ANTICOAGULATION CONSULT NOTE - follow up  Pharmacy Consult for Heparin Indication: atrial fibrillation  No Known Allergies  Patient Measurements: Height: 5\' 3"  (160 cm) Weight: 90.9 kg (200 lb 6.4 oz) IBW/kg (Calculated) : 52.4 Heparin Dosing Weight: 73 kg  Vital Signs: Temp: 97.7 F (36.5 C) (08/25 0334) Temp Source: Oral (08/25 0334) BP: 125/61 (08/25 0300) Pulse Rate: 79 (08/25 0334)  Labs: Recent Labs    09/22/20 1441 09/22/20 2050 09/22/20 2148 09/23/20 0207 09/24/20 0239 09/24/20 0323 09/24/20 2007 09/25/20 0447  HGB 15.6*  --   --  15.7*  --  15.4*  --  15.4*  HCT 47.4*  --   --  49.4*  --  49.4*  --  49.7*  PLT 283  --   --  253  --  318  --  349  LABPROT  --  16.0*  --   --   --   --   --   --   INR  --  1.3*  --   --   --   --   --   --   HEPARINUNFRC  --   --   --   --   --   --  0.10* 0.47  CREATININE 1.28*  --   --  1.04*  --  0.75  --   --   CKTOTAL  --   --  60  --   --   --   --   --   TROPONINIHS  --   --  128* 100* 52*  --   --   --      Estimated Creatinine Clearance: 61 mL/min (by C-G formula based on SCr of 0.75 mg/dL).   Medical History: Past Medical History:  Diagnosis Date   COPD (chronic obstructive pulmonary disease) (HCC)    Assessment: [name] admitted for acute respiratory failure currently being treated for PNA and acute CHF with new-onset atrial fibrillation with rapid ventricular response. Pharmacy consulted to manage heparin infusion. Last dose of Lovenox last PM.   09/25/2020 HL 0.47 therapeutic on 1300 units/hr Hgb 15.4, Plts WNL No bleeding reported   Goal of Therapy:  Heparin level 0.3-0.7 units/ml Monitor platelets by anticoagulation protocol: Yes   Plan:  Continue heparin drip at 1300 units/hr Confirmatory heparin level in 8 hours Daily CBC   09/27/2020 RPh 09/25/2020, 5:12 AM

## 2020-09-25 NOTE — Progress Notes (Signed)
Patient wore BiPAP for 3.5 hours (from 2330 to 0300). VSS at this time on NRB mask.

## 2020-09-25 NOTE — Progress Notes (Signed)
PROGRESS NOTE    ZANIYA MCAULAY  GBT:[id number] DOB: [birth year] DOA: 09/22/2020 PCP: System, Provider Not In  Brief Narrative:  The patient is 80 year old female with history of COPD presented with shortness of breath, body aches, decreased appetite and coughing.  Patients daughter was diagnosed with COVID last week.  She was started on Paxilovid empirically due to risk of exposure.  Patient continued to get worse and developed shortness of breath.  On EMS arrival she required 6 L/min of oxygen.  In the ED SARS-CoV-2 RT-PCR was negative.  Chest x-ray showed bilateral infiltrates, CTA was negative for PE but showed evidence of bilateral infiltrates.  Patient started on Rocephin and Zithromax for community-acquired pneumonia.  Subsequently she decompensated and worsened and went into A. fib with RVR and had to be placed on BiPAP and she is subsequently moved to the stepdown unit.  We are empirically treating for COVID and pulmonary has been consulted as well as cardiology.  Cardiology started the patient on Cardizem drip and pulmonary recommending continuing steroids.  She is weaned to 15 L nonrebreather and remains in A. fib without RVR so her Cardizem is being changed to 180 mg p.o. daily and changing heparin to Eliquis and discontinue aspirin.  Cardiology recommends continue Lasix IV 40 mg daily     Assessment & Plan:   Active Problems:   CAP (community acquired pneumonia)   Acute respiratory failure with hypoxia and hypercapnia (HCC)   Sepsis ([place])   Elevated LFTs   Cardiomegaly   Elevated troponin   Atrial fibrillation (HCC)   Acute and chronic respiratory failure (acute-on-chronic) (HCC)  Acute respiratory failure with hypoxia requiring noninvasive positive pressure ventilation with BiPAP 12/6 60% FiO2 -Multifactorial from underlying multifocal pneumonia, diastolic CHF -Patient is currently on Rocephin and Zithromax and will continue -Follow blood culture results -She was given 1 dose  of Lasix 40 mg IV yesterday and will give another dose today -CTA of the Chest PE protocol done and showed "No evidence of acute pulmonary embolism. Patchy consolidative opacities in the lung bases and  lingula have slightly worsened compared to the prior study and remain concerning for multifocal pneumonia, with differential including COVID. As before, follow-up chest CT in 3 months is recommended to assess for resolution. Unchanged cardiomegaly with evidence of right heart failure and pulmonary hypertension as above. Coronary artery calcifications and Aortic Atherosclerosis (ICD10-I70.0). -SpO2: (!) 89 % O2 Flow Rate (L/min): 35 L/min FiO2 (%): 100 %; was on 15 L nonrebreather -Inflammatory Markers Recent Labs    09/23/20 1844 09/24/20 0323 09/25/20 0838  DDIMER 2.04*  --   --   CRP  --  28.0* 12.3*   -ABG    Component Value Date/Time   PHART 7.325 (L) 09/25/2020 0320   PCO2ART 67.6 (HH) 09/25/2020 0320   PO2ART 130 (H) 09/25/2020 0320   HCO3 34.2 (H) 09/25/2020 0320   O2SAT 98.6 09/25/2020 0320     Lab Results  Component Value Date   [place] NEGATIVE 09/24/2020   [place] NEGATIVE 09/22/2020     -Speech therapy consulted for swallow evaluation -Appreciate PCCM consult and they placed the patient on BiPAP but has now been weaned to 15 L; pulmonary feels that this is clinically COVID given that hypoxemia and hypercapnia however now her isolation precautions have been discontinued -Pulmonary recommends continuing BiPAP nightly and 2 hours twice a day -They are recommending nebs -They are also recommending steroids for COPD and suspected COVID -She is given IV Lasix within pulmonary  give a fluid bolus of 500 cc and then D5 LR; cardiology recommends continue IV Lasix -Clear liquid diet was initiated yesterday but now is advance to oral diet -Pulmonary recommends continue diuresis  A. fib with RVR; now RVR is improved -Went into A. fib with RVR early this morning -Heart  rates were ranging in the mid 100s and now in the 90s -She was placed on a Cardizem drip and now cardiology changing to 180 mg p.o. Cardizem -Currently on anticoagulation with IV heparin given her CHA2DS2-VASc score of 3 but will now be changed to p.o. Eliquis by cardiology -Continue to monitor on telemetry  AKI -Resolving; BUN/Cr is now 43/0.68 -Patient's BUNs/creatinine went from 69/1.28 and is now trended down to 58/0.75 -Avoid nephrotoxic medications, contrast dyes, hypotension and renally dose medications -Continue monitor and trend renal function and repeat CMP in a.m.   Acute Diastolic CHF/right ventricular function/pulmonary hypertension -Echocardiogram is currently pending -She has bilateral crackles on auscultation -Cardiology recommends continuing diuresis with IV Lasix -BNP was elevated but cardiology feels that this may be underlying related to respiratory issues -Strict I's and O's and daily weights -Given her history of COPD with acute exacerbation multifocal pneumonia, cardiology feels that the hypoxemia is resulting in acute right ventricular strain with right ventricular dilatation and right ventricular dysfunction -Patient has moderate pulm hypertension on echo related to underlying COPD with acute exacerbation in the setting of hypoxemia -Repeat CTA PE as above -BNP was elevated at 889.7 -Cardiology has been consulted and appreciate further care per them   Elevated Troponin -Likely demand ischemia -hSTrop 12 -> 100 and then trended down to 52 -EKG shows T wave abnormalities in anterior chest leads -Cardiology has been consulted   Abnormal LFTs/Transaminitis -Unclear etiology -?  Passive liver congestion from CHF -Total bilirubin is normal, alk phos is normal -LFTs on admission showed an AST of 503 and ALT of 439; now improving as AST is now 87 and ALT is 228   Right middle lobe nodule -Will need repeat CT in 3 months after treatment of pneumonia    Obesity -Complicates overall prognosis and care -Estimated body mass index is 35.5 kg/m as calculated from the following:   Height as of this encounter: _0  (1.6 m).   Weight as of this encounter: 90.9 kg. -Weight Loss and Dietary Counseling    DVT prophylaxis: Anticoagulated with heparin drip Code Status: FULL CODE  Family Communication: (No family currently at bedside Disposition Plan: Continue to monitor in the stepdown unit  Status is: Inpatient  Remains inpatient appropriate because:Unsafe d/c plan, IV treatments appropriate due to intensity of illness or inability to take PO, and Inpatient level of care appropriate due to severity of illness  Dispo: The patient is from: Home              Anticipated d/c is to:  TBD              Patient currently is not medically stable to d/c.   Difficult to place patient No   Consultants:  Pulmonary/PCCM Cardiology  Procedures:  ECHOCARDIOGRAM IMPRESSIONS     1. Left ventricular ejection fraction, by estimation, is 55 to 60%. Left  ventricular ejection fraction by 3D volume is 59 %. The left ventricle has  normal function. The left ventricle has no regional wall motion  abnormalities. There is moderate left  ventricular hypertrophy. Left ventricular diastolic parameters are  consistent with Grade I diastolic dysfunction (impaired relaxation).   2. Right ventricular  systolic function mildly reduced at base and mid,  with relative preserved apical systolic function. The right ventricular  size is moderately enlarged. There is moderately elevated pulmonary artery  systolic pressure. The estimated  right ventricular systolic pressure is 94.7 mmHg.   3. Left atrial size was mildly dilated.   4. Right atrial size was moderately dilated.   5. The mitral valve is normal in structure. Trivial mitral valve  regurgitation. No evidence of mitral stenosis.   6. Tricuspid valve regurgitation is mild to moderate.   7. The aortic valve is  normal in structure. Aortic valve regurgitation is  not visualized. No aortic stenosis is present.   8. The inferior vena cava is dilated in size with <50% respiratory  variability, suggesting right atrial pressure of 15 mmHg.   FINDINGS   Left Ventricle: Left ventricular ejection fraction, by estimation, is 55  to 60%. Left ventricular ejection fraction by 3D volume is 59 %. The left  ventricle has normal function. The left ventricle has no regional wall  motion abnormalities. The left  ventricular internal cavity size was normal in size. There is moderate  left ventricular hypertrophy. Left ventricular diastolic parameters are  consistent with Grade I diastolic dysfunction (impaired relaxation).   Right Ventricle: Calcified moderate band. The right ventricular size is  moderately enlarged. No increase in right ventricular wall thickness.  Right ventricular systolic function mildly reduced at base and mid, with  relative preserved apical systolic  function. There is moderately elevated pulmonary artery systolic pressure.  The tricuspid regurgitant velocity is 2.89 m/s, and with an assumed right  atrial pressure of 15 mmHg, the estimated right ventricular systolic  pressure is 09.6 mmHg.   Left Atrium: Left atrial size was mildly dilated.   Right Atrium: Right atrial size was moderately dilated.   Pericardium: There is no evidence of pericardial effusion. Presence of  pericardial fat pad.   Mitral Valve: The mitral valve is normal in structure. Mild mitral annular  calcification. Trivial mitral valve regurgitation. No evidence of mitral  valve stenosis.   Tricuspid Valve: The tricuspid valve is normal in structure. Tricuspid  valve regurgitation is mild to moderate. No evidence of tricuspid  stenosis.   Aortic Valve: The aortic valve is normal in structure. Aortic valve  regurgitation is not visualized. No aortic stenosis is present.   Pulmonic Valve: The pulmonic valve was  normal in structure. Pulmonic valve  regurgitation is trivial. No evidence of pulmonic stenosis.   Aorta: Aorta dimension upper limit of normal for age and BSA. The aortic  root is normal in size and structure.   Venous: The inferior vena cava is dilated in size with less than 50%  respiratory variability, suggesting right atrial pressure of 15 mmHg.   IAS/Shunts: The interatrial septum appears to be lipomatous. No atrial  level shunt detected by color flow Doppler.      LEFT VENTRICLE  PLAX 2D  LVIDd:         3.60 cm         Diastology  LVIDs:         2.50 cm         LV e' medial:    6.31 cm/s  LV PW:         1.30 cm         LV E/e' medial:  9.0  LV IVS:        1.30 cm         LV e'  lateral:   10.20 cm/s  LVOT diam:     2.00 cm         LV E/e' lateral: 5.6  LV SV:         54  LV SV Index:   28  LVOT Area:     3.14 cm        3D Volume EF                                 LV 3D EF:    Left                                              ventricul  LV Volumes (MOD)                            ar  LV vol d, MOD    73.9 ml                    ejection  A2C:                                        fraction  LV vol d, MOD    53.3 ml                    by 3D  A4C:                                        volume is  LV vol s, MOD    28.2 ml                    59 %.  A2C:  LV vol s, MOD    20.1 ml  A4C:                           3D Volume EF:  LV SV MOD A2C:   45.7 ml       3D EF:        59 %  LV SV MOD A4C:   53.3 ml  LV SV MOD BP:    38.1 ml   RIGHT VENTRICLE             IVC  RV S prime:     10.60 cm/s  IVC diam: 2.90 cm  TAPSE (M-mode): 1.8 cm   LEFT ATRIUM             Index       RIGHT ATRIUM           Index  LA diam:        3.90 cm 2.02 cm/m  RA Area:     25.80 cm  LA Vol (A2C):   33.2 ml 17.16 ml/m RA Volume:   96.90 ml  50.07 ml/m  LA Vol (A4C):   54.8 ml 28.32 ml/m  LA Biplane Vol: 43.4 ml 22.43 ml/m   AORTIC VALVE             PULMONIC VALVE  LVOT Vmax:  95.70 cm/s  PR  End Diast Vel: 1.35 msec  LVOT Vmean:  61.300 cm/s  LVOT VTI:    0.172 m     AORTA  Ao Root diam: 3.70 cm  Ao Asc diam:  3.80 cm   MITRAL VALVE               TRICUSPID VALVE  MV Area (PHT): 2.95 cm    TV Peak grad:   34.6 mmHg  MV Decel Time: 257 msec    TV Vmax:        2.94 m/s  MV E velocity: 56.80 cm/s  TR Peak grad:   33.4 mmHg  MV A velocity: 82.10 cm/s  TR Vmax:        289.00 cm/s  MV E/A ratio:  0.69                             SHUNTS                             Systemic VTI:  0.17 m                             Systemic Diam: 2.00 cm   Antimicrobials:  Anti-infectives (From admission, onward)    Start     Dose/Rate Route Frequency Ordered Stop   09/23/20 1800  cefTRIAXone (ROCEPHIN) 2 g in sodium chloride 0.9 % 100 mL IVPB        2 g 200 mL/hr over 30 Minutes Intravenous Every 24 hours 09/22/20 1842 09/28/20 1759   09/23/20 1800  azithromycin (ZITHROMAX) 500 mg in sodium chloride 0.9 % 250 mL IVPB  Status:  Discontinued        500 mg 250 mL/hr over 60 Minutes Intravenous Every 24 hours 09/22/20 1842 09/25/20 1456   09/22/20 1630  cefTRIAXone (ROCEPHIN) 1 g in sodium chloride 0.9 % 100 mL IVPB        1 g 200 mL/hr over 30 Minutes Intravenous  Once 09/22/20 1618 09/22/20 1806   09/22/20 1630  azithromycin (ZITHROMAX) 500 mg in sodium chloride 0.9 % 250 mL IVPB        500 mg 250 mL/hr over 60 Minutes Intravenous  Once 09/22/20 1618 09/22/20 1939        Subjective: Seen and examined and she was still on BiPAP this morning and will tachypneic but not as tachycardic.  Continues to feel the same did not feel much better.  No lightheadedness or dizziness.  Numbers are improving.  No other concerns or complaints at this time.  Objective: Vitals:   09/25/20 1500 09/25/20 1600 09/25/20 1700 09/25/20 1800  BP: 125/73 116/71 103/60 115/74  Pulse: 79 86 84 93  Resp: (!) 25 (!) 27 (!) 21 (!) 26  Temp:  98.1 F (36.7 C)    TempSrc:  Axillary    SpO2: 92% 97% (!) 89% (!) 89%   Weight:      Height:        Intake/Output Summary (Last 24 hours) at 09/25/2020 1901 Last data filed at 09/25/2020 1800 Gross per 24 hour  Intake 1810.69 ml  Output 2400 ml  Net -589.31 ml    Filed Weights   09/22/20 2020  Weight: 90.9 kg   Examination: Physical Exam:  Constitutional: WN/WD obese Caucasian female currently in some respiratory distress wearing BiPAP Eyes:  Lids and conjunctivae normal, sclerae anicteric  ENMT: External Ears, Nose appear normal. Grossly normal hearing. Neck: Appears normal, supple, no cervical masses, normal ROM, no appreciable thyromegaly; no appreciable JVD Respiratory: Diminished to auscultation bilaterally with coarse breath sounds and wearing BiPAP with a slightly increased respiratory effort.  No wheezing noted but she does have some crackles Cardiovascular: Irregularly irregular but not tachycardic.  Mild lower extremity edema no carotid bruits.  Abdomen: Soft, non-tender, distended secondary body habitus. Bowel sounds positive.  GU: Deferred. Musculoskeletal: No clubbing / cyanosis of digits/nails. No joint deformity upper and lower extremities. Skin: No rashes, lesions, ulcers on limited skin evaluation. No induration; Warm and dry.  Neurologic: CN 2-12 grossly intact with no focal deficits. Romberg sign and cerebellar reflexes not assessed.  Psychiatric: Normal judgment and insight. Alert and oriented x 3. Normal mood and appropriate affect.   Data Reviewed: I have personally reviewed following labs and imaging studies  CBC: Recent Labs  Lab 09/22/20 1441 09/23/20 0207 09/24/20 0323 09/25/20 0447  WBC 21.6* 19.7* 16.5* 18.6*  NEUTROABS 16.7* 16.9*  --   --   HGB 15.6* 15.7* 15.4* 15.4*  HCT 47.4* 49.4* 49.4* 49.7*  MCV 98.3 100.6* 100.8* 102.5*  PLT 283 253 318 056    Basic Metabolic Panel: Recent Labs  Lab 09/22/20 1441 09/22/20 1500 09/23/20 0207 09/24/20 0323 09/25/20 0447  NA 138  --  139 141 142  K 3.6  --  3.9  3.6 4.6  CL 99  --  102 99 104  CO2 28  --  25 30 33*  GLUCOSE 125*  --  141* 187* 178*  BUN 69*  --  69* 58* 43*  CREATININE 1.28*  --  1.04* 0.75 0.68  CALCIUM 8.5*  --  8.3* 8.3* 8.4*  MG  --  2.6* 2.7* 2.8* 2.7*  PHOS  --  3.8 3.5  --  3.2    GFR: Estimated Creatinine Clearance: 61 mL/min (by C-G formula based on SCr of 0.68 mg/dL). Liver Function Tests: Recent Labs  Lab 09/22/20 1441 09/23/20 0207 09/24/20 0323 09/25/20 0838  AST 503* 170* 91* 87*  ALT 439* 327* 264* 228*  ALKPHOS 102 97 112 97  BILITOT 1.4* 1.1 0.8 0.7  PROT 7.3 6.8 7.0 6.5  ALBUMIN 3.3* 3.1* 2.9* 2.7*    No results for input(s): LIPASE, AMYLASE in the last 168 hours. No results for input(s): AMMONIA in the last 168 hours. Coagulation Profile: Recent Labs  Lab 09/22/20 2050  INR 1.3*    Cardiac Enzymes: Recent Labs  Lab 09/22/20 2148  CKTOTAL 60    BNP (last 3 results) No results for input(s): PROBNP in the last 8760 hours. HbA1C: No results for input(s): HGBA1C in the last 72 hours. CBG: No results for input(s): GLUCAP in the last 168 hours. Lipid Profile: Recent Labs    09/23/20 0207  CHOL 104  HDL 25*  LDLCALC 63  TRIG 80  CHOLHDL 4.2    Thyroid Function Tests: Recent Labs    09/23/20 0207  TSH 1.988    Anemia Panel: No results for input(s): VITAMINB12, FOLATE, FERRITIN, TIBC, IRON, RETICCTPCT in the last 72 hours.  Sepsis Labs: Recent Labs  Lab 09/22/20 1441 09/23/20 0207 09/24/20 0323  PROCALCITON 0.48 0.49 0.23  LATICACIDVEN 1.7  --   --      Recent Results (from the past 240 hour(s))  Resp Panel by RT-PCR (Flu A&B, Covid) Nasopharyngeal Swab     Status: None  Collection Time: 09/22/20  2:41 PM   Specimen: Nasopharyngeal Swab; Nasopharyngeal(NP) swabs in vial transport medium  Result Value Ref Range Status   SARS Coronavirus 2 by RT PCR NEGATIVE NEGATIVE Final    Comment: (NOTE) SARS-CoV-2 target nucleic acids are NOT DETECTED.  The SARS-CoV-2 RNA  is generally detectable in upper respiratory specimens during the acute phase of infection. The lowest concentration of SARS-CoV-2 viral copies this assay can detect is 138 copies/mL. A negative result does not preclude SARS-Cov-2 infection and should not be used as the sole basis for treatment or other patient management decisions. A negative result may occur with  improper specimen collection/handling, submission of specimen other than nasopharyngeal swab, presence of viral mutation(s) within the areas targeted by this assay, and inadequate number of viral copies(<138 copies/mL). A negative result must be combined with clinical observations, patient history, and epidemiological information. The expected result is Negative.  Fact Sheet for Patients:  EntrepreneurPulse.com.au  Fact Sheet for Healthcare Providers:  IncredibleEmployment.be  This test is no t yet approved or cleared by the [place] FDA and  has been authorized for detection and/or diagnosis of SARS-CoV-2 by FDA under an Emergency Use Authorization (EUA). This EUA will remain  in effect (meaning this test can be used) for the duration of the COVID-19 declaration under Section 564(b)(1) of the Act, 21 U.S.C.section 360bbb-3(b)(1), unless the authorization is terminated  or revoked sooner.       Influenza A by PCR NEGATIVE NEGATIVE Final   Influenza B by PCR NEGATIVE NEGATIVE Final    Comment: (NOTE) The Xpert Xpress SARS-CoV-2/FLU/RSV plus assay is intended as an aid in the diagnosis of influenza from Nasopharyngeal swab specimens and should not be used as a sole basis for treatment. Nasal washings and aspirates are unacceptable for Xpert Xpress SARS-CoV-2/FLU/RSV testing.  Fact Sheet for Patients: EntrepreneurPulse.com.au  Fact Sheet for Healthcare Providers: IncredibleEmployment.be  This test is not yet approved or cleared by the  [place] FDA and has been authorized for detection and/or diagnosis of SARS-CoV-2 by FDA under an Emergency Use Authorization (EUA). This EUA will remain in effect (meaning this test can be used) for the duration of the COVID-19 declaration under Section 564(b)(1) of the Act, 21 U.S.C. section 360bbb-3(b)(1), unless the authorization is terminated or revoked.  Performed at [facility], [place] [address] [place], [place] [address]   Blood Culture (routine x 2)     Status: None (Preliminary result)   Collection Time: 09/22/20  2:41 PM   Specimen: BLOOD  Result Value Ref Range Status   Specimen Description   Final    BLOOD BLOOD LEFT FOREARM Performed at [place] [address] [place], [place] [address]    Special Requests   Final    BOTTLES DRAWN AEROBIC AND ANAEROBIC Blood Culture adequate volume Performed at [facility] [address] [place], [place] [address]    Culture   Final    NO GROWTH 3 DAYS Performed at [facility], [place] [address] [place], [place] [address]    Report Status PENDING  Incomplete  Blood Culture (routine x 2)     Status: None (Preliminary result)   Collection Time: 09/22/20  5:20 PM   Specimen: BLOOD LEFT HAND  Result Value Ref Range Status   Specimen Description   Final    BLOOD LEFT HAND Performed at [facility], [place] [address], [place], [place] [address]    Special  Requests   Final    BOTTLES DRAWN AEROBIC AND ANAEROBIC Blood Culture results may not be optimal due to an excessive volume of blood received in culture bottles Performed at [place] [address] [place], [place] [address]    Culture   Final    NO GROWTH 3 DAYS Performed at [facility], [place] [address], [place], [place] [address]    Report Status PENDING  Incomplete  Respiratory (~20 pathogens) panel by PCR     Status: None   Collection Time: 09/22/20 10:47 PM    Specimen: Nasopharyngeal Swab; Respiratory  Result Value Ref Range Status   Adenovirus NOT DETECTED NOT DETECTED Final   Coronavirus 229E NOT DETECTED NOT DETECTED Final    Comment: (NOTE) The Coronavirus on the Respiratory Panel, DOES NOT test for the novel  Coronavirus (2019 nCoV)    Coronavirus HKU1 NOT DETECTED NOT DETECTED Final   Coronavirus NL63 NOT DETECTED NOT DETECTED Final   Coronavirus OC43 NOT DETECTED NOT DETECTED Final   Metapneumovirus NOT DETECTED NOT DETECTED Final   Rhinovirus / Enterovirus NOT DETECTED NOT DETECTED Final   Influenza A NOT DETECTED NOT DETECTED Final   Influenza B NOT DETECTED NOT DETECTED Final   Parainfluenza Virus 1 NOT DETECTED NOT DETECTED Final   Parainfluenza Virus 2 NOT DETECTED NOT DETECTED Final   Parainfluenza Virus 3 NOT DETECTED NOT DETECTED Final   Parainfluenza Virus 4 NOT DETECTED NOT DETECTED Final   Respiratory Syncytial Virus NOT DETECTED NOT DETECTED Final   Bordetella pertussis NOT DETECTED NOT DETECTED Final   Bordetella Parapertussis NOT DETECTED NOT DETECTED Final   Chlamydophila pneumoniae NOT DETECTED NOT DETECTED Final   Mycoplasma pneumoniae NOT DETECTED NOT DETECTED Final    Comment: Performed at [facility], [place]. [address], [place], [place] [address]  MRSA Next Gen by PCR, Nasal     Status: None   Collection Time: 09/24/20  5:24 AM   Specimen: Nasal Mucosa; Nasal Swab  Result Value Ref Range Status   MRSA by PCR Next Gen NOT DETECTED NOT DETECTED Final    Comment: (NOTE) The GeneXpert MRSA Assay (FDA approved for NASAL specimens only), is one component of a comprehensive MRSA colonization surveillance program. It is not intended to diagnose MRSA infection nor to guide or monitor treatment for MRSA infections. Test performance is not FDA approved in patients less than 18 years old. Performed at [facility], [place] [address], [place],  [address]   Resp Panel by RT-PCR (Flu A&B,  Covid) Nasopharyngeal Swab     Status: None   Collection Time: 09/24/20  2:21 PM   Specimen: Nasopharyngeal Swab; Nasopharyngeal(NP) swabs in vial transport medium  Result Value Ref Range Status   SARS Coronavirus 2 by RT PCR NEGATIVE NEGATIVE Final    Comment: (NOTE) SARS-CoV-2 target nucleic acids are NOT DETECTED.  The SARS-CoV-2 RNA is generally detectable in upper respiratory specimens during the acute phase of infection. The lowest concentration of SARS-CoV-2 viral copies this assay can detect is 138 copies/mL. A negative result does not preclude SARS-Cov-2 infection and should not be used as the sole basis for treatment or other patient management decisions. A negative result may occur with  improper specimen collection/handling, submission of specimen other than nasopharyngeal swab, presence of viral mutation(s) within the areas targeted by this assay, and inadequate number of viral copies(<138 copies/mL). A negative result must be combined with clinical observations, patient history, and epidemiological information.  The expected result is Negative.  Fact Sheet for Patients:  EntrepreneurPulse.com.au  Fact Sheet for Healthcare Providers:  IncredibleEmployment.be  This test is no t yet approved or cleared by the [place] FDA and  has been authorized for detection and/or diagnosis of SARS-CoV-2 by FDA under an Emergency Use Authorization (EUA). This EUA will remain  in effect (meaning this test can be used) for the duration of the COVID-19 declaration under Section 564(b)(1) of the Act, 21 U.S.C.section 360bbb-3(b)(1), unless the authorization is terminated  or revoked sooner.       Influenza A by PCR NEGATIVE NEGATIVE Final   Influenza B by PCR NEGATIVE NEGATIVE Final    Comment: (NOTE) The Xpert Xpress SARS-CoV-2/FLU/RSV plus assay is intended as an aid in the diagnosis of influenza from Nasopharyngeal swab specimens and should  not be used as a sole basis for treatment. Nasal washings and aspirates are unacceptable for Xpert Xpress SARS-CoV-2/FLU/RSV testing.  Fact Sheet for Patients: EntrepreneurPulse.com.au  Fact Sheet for Healthcare Providers: IncredibleEmployment.be  This test is not yet approved or cleared by the [place] FDA and has been authorized for detection and/or diagnosis of SARS-CoV-2 by FDA under an Emergency Use Authorization (EUA). This EUA will remain in effect (meaning this test can be used) for the duration of the COVID-19 declaration under Section 564(b)(1) of the Act, 21 U.S.C. section 360bbb-3(b)(1), unless the authorization is terminated or revoked.  Performed at [facility], [place] [address], [place], [place] [address]       RN Pressure Injury Documentation:     Estimated body mass index is 35.5 kg/m as calculated from the following:   Height as of this encounter: _0  (1.6 m).   Weight as of this encounter: 90.9 kg.  Malnutrition Type:      Malnutrition Characteristics:      Nutrition Interventions:        Radiology Studies: CT Angio Chest Pulmonary Embolism (PE) W or WO Contrast  Result Date: 09/24/2020 CLINICAL DATA:  Shortness of breath, body aches, cough, elevated D-dimer EXAM: CT ANGIOGRAPHY CHEST WITH CONTRAST TECHNIQUE: Multidetector CT imaging of the chest was performed using the standard protocol during bolus administration of intravenous contrast. Multiplanar CT image reconstructions and MIPs were obtained to evaluate the vascular anatomy. CONTRAST:  76m OMNIPAQUE IOHEXOL 350 MG/ML SOLN COMPARISON:  CTA chest 2 days prior FINDINGS: Cardiovascular: Satisfactory opacification of the pulmonary arteries to the segmental level. There is no evidence of acute or chronic pulmonary embolism. The heart is enlarged. Coronary artery calcifications and calcified atherosclerotic plaque of the aortic arch are  again seen. There is no pericardial effusion. The main pulmonary artery is significantly enlarged measuring up to 4.0 cm. There is reflux of contrast into the IVC and hepatic veins, also seen on the prior study. Mediastinum/Nodes: The imaged thyroid is unremarkable. A 1.0 cm precarinal lymph node and 1.0 cm right hilar lymph node are unchanged, nonspecific and possibly reactive. There is no other mediastinal or hilar lymphadenopathy. There is no axillary lymphadenopathy. Lungs/Pleura: The trachea and central airways are patent. Bilateral bronchial wall thickening is similar to the prior study. There is a background of mild-to-moderate centrilobular emphysema. There are consolidative opacities in both lung bases and peripheral opacity in the lingula, slightly worsened compared to the prior study. Some of the opacities again have a nodular appearance (for example in the superior segment of the left lower lobe measuring up to 8 mm, 4-34). There is no pleural effusion or pneumothorax.  Calcified pleural plaque along the right base is unchanged, likely related to prior asbestos exposure. Upper Abdomen: The imaged portions of the upper abdominal viscera are unremarkable. Musculoskeletal: Deformity of the right anterior fourth through sixth ribs is likely related to prior trauma. There is multilevel degenerative change of the thoracic spine with mild anterior wedge deformity of the T3 vertebral body, unchanged. There is no acute osseous abnormality. Review of the MIP images confirms the above findings. IMPRESSION: 1. No evidence of acute pulmonary embolism. 2. Patchy consolidative opacities in the lung bases and lingula have slightly worsened compared to the prior study and remain concerning for multifocal pneumonia, with differential including COVID. As before, follow-up chest CT in 3 months is recommended to assess for resolution. 3. 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 4. Coronary artery calcifications and Aortic Atherosclerosis (ICD10-I70.0). Electronically Signed   By: Valetta Mole M.D.   On: 09/24/2020 14:44   DG CHEST PORT 1 VIEW  Result Date: 09/25/2020 CLINICAL DATA:  Shortness of breath, body aches, decreased appetite, and cough. COVID diagnosis. EXAM: PORTABLE CHEST 1 VIEW COMPARISON:  Chest 09/22/2020.  CT chest 09/24/2020 FINDINGS: Cardiac enlargement. Prominent central hilar structures. Bilateral perihilar and basilar infiltrates could represent pneumonia or edema. Suggestion of developing density in the left costophrenic angle which may represent consolidation. Calcification of the aorta. No pneumothorax. IMPRESSION: Cardiac enlargement with prominent central hilar structures corresponding to vascular prominence on CT. Bilateral perihilar infiltrates. Suggestion of progressing consolidation in the left base. Electronically Signed   By: Lucienne Capers M.D.   On: 09/25/2020 17:28   VAS [place] LOWER EXTREMITY VENOUS (DVT)  Result Date: 09/24/2020  Lower Venous DVT Study Patient Name:  ROCHELLA BENNER  Date of Exam:   09/24/2020 Medical Rec #: [id number]      Accession #:    [id number] Date of Birth: 08-21-1940     Patient Gender: F Patient Age:   34 years Exam Location:  [facility] Procedure:      VAS [place] LOWER EXTREMITY VENOUS (DVT) Referring Phys: MURALI RAMASWAMY --------------------------------------------------------------------------------  Indications: Edema.  Risk Factors: COVID 19 positive. Limitations: Poor ultrasound/tissue interface. Comparison Study: No prior studies. Performing Technologist: Oliver Hum RVT  Examination Guidelines: A complete evaluation includes B-mode imaging, spectral Doppler, color Doppler, and power Doppler as needed of all accessible portions of each vessel. Bilateral testing is considered an integral part of a complete examination. Limited examinations for reoccurring indications may be performed as noted. The  reflux portion of the exam is performed with the patient in reverse Trendelenburg.  +---------+---------------+---------+-----------+----------+--------------+ RIGHT    CompressibilityPhasicitySpontaneityPropertiesThrombus Aging +---------+---------------+---------+-----------+----------+--------------+ CFV      Full           Yes      Yes                                 +---------+---------------+---------+-----------+----------+--------------+ SFJ      Full                                                        +---------+---------------+---------+-----------+----------+--------------+ FV Prox  Full                                                        +---------+---------------+---------+-----------+----------+--------------+  FV Mid   Full                                                        +---------+---------------+---------+-----------+----------+--------------+ FV DistalFull                                                        +---------+---------------+---------+-----------+----------+--------------+ PFV      Full                                                        +---------+---------------+---------+-----------+----------+--------------+ POP      Full           Yes      Yes                                 +---------+---------------+---------+-----------+----------+--------------+ PTV      Full                                                        +---------+---------------+---------+-----------+----------+--------------+ PERO     Full                                                        +---------+---------------+---------+-----------+----------+--------------+   +---------+---------------+---------+-----------+----------+--------------+ LEFT     CompressibilityPhasicitySpontaneityPropertiesThrombus Aging +---------+---------------+---------+-----------+----------+--------------+ CFV      Full           Yes       Yes                                 +---------+---------------+---------+-----------+----------+--------------+ SFJ      Full                                                        +---------+---------------+---------+-----------+----------+--------------+ FV Prox  Full                                                        +---------+---------------+---------+-----------+----------+--------------+ FV Mid   Full                                                        +---------+---------------+---------+-----------+----------+--------------+  FV DistalFull                                                        +---------+---------------+---------+-----------+----------+--------------+ PFV      Full                                                        +---------+---------------+---------+-----------+----------+--------------+ POP      Full           Yes      Yes                                 +---------+---------------+---------+-----------+----------+--------------+ PTV      Full                                                        +---------+---------------+---------+-----------+----------+--------------+ PERO     Full                                                        +---------+---------------+---------+-----------+----------+--------------+     Summary: RIGHT: - There is no evidence of deep vein thrombosis in the lower extremity.  - No cystic structure found in the popliteal fossa.  LEFT: - There is no evidence of deep vein thrombosis in the lower extremity.  - No cystic structure found in the popliteal fossa.  *See table(s) above for measurements and observations. Electronically signed by Harold Barban MD on 09/24/2020 at 8:07:15 PM.    Final     Scheduled Meds:  apixaban  5 mg Oral BID   Chlorhexidine Gluconate Cloth  6 each Topical Daily   diltiazem  120 mg Oral Daily   guaiFENesin  600 mg Oral BID   ipratropium-albuterol  3 mL  Nebulization Q6H   mouth rinse  15 mL Mouth Rinse BID   methylPREDNISolone (SOLU-MEDROL) injection  60 mg Intravenous Q12H   Continuous Infusions:  cefTRIAXone (ROCEPHIN)  IV Stopped (09/25/20 1840)   dextrose 5% lactated ringers 50 mL/hr at 09/25/20 1800    LOS: 3 days   Kerney Elbe, DO [facility] PAGER is on AMION  If 7PM-7AM, please contact night-coverage [email or web address]

## 2020-09-25 NOTE — Progress Notes (Signed)
SLP Cancellation Note  Patient Details Name: Kimberly Ballard MRN: [id number] DOB: 15-Dec-1940   Cancelled treatment:       Reason Eval/Treat Not Completed: Other (comment) (Pt on Bipap at this time, will continue efforts)  [name], MS [facility] SLP Acute Rehab Services Office [phone] Pager [phone]  Chales Abrahams 09/25/2020, 8:30 AM

## 2020-09-25 NOTE — Evaluation (Signed)
Physical Therapy Evaluation Patient Details Name: Kimberly Ballard MRN: [id number] DOB: [birth year] Today's Date: 09/25/2020   History of Present Illness  Patient is a 80 year old female presented with shortness of breath, body aches, decreased appetite and coughing. Per [facility] patient diagnosedw ith COVID week previous to admission. PMH includes COPD, Covid negative but on precautions  Clinical Impression  The patient resting in bed on NRB mask-15 L.  Patient able to mobilize to sitting on bed edge with min asisstance. Stood with HHA and took 2 side steps, legs weak and buckled.  Patient lives alone, son close by. Patient states that she does not want to return to her home, wants to live with daughter in [place].  Patient's SPO2 >90%, HR max 113.  Pt admitted with above diagnosis.  Pt currently with functional limitations due to the deficits listed below (see PT Problem List). Pt will benefit from skilled PT to increase their independence and safety with mobility to allow discharge to the venue listed below.       Follow Up Recommendations SNF or HHPT if progresses and family available.    Equipment Recommendations  None recommended by PT    Recommendations for Other Services       Precautions / Restrictions Precautions Precautions: Fall Precaution Comments: monitor vitals; currently on 15L NRB      Mobility  Bed Mobility   Bed Mobility: Supine to Sit;Sit to Supine     Supine to sit: Mod assist;HOB elevated;+2 for safety/equipment;+2 for physical assistance     General bed mobility comments: patient needing mod A to elevate trunk to sitting position +1-2 assist. assist to bring legs back onto bed at end of session    Transfers Overall transfer level: Needs assistance Equipment used: 2 person hand held assist Transfers: Sit to/from Stand Sit to Stand: Min assist;+2 physical assistance;+2 safety/equipment         General transfer comment: min A x1-2 to power up to standing.  fatigues quickly and appears to have mild loss of balance sitting quickly back onto bed after 1-2 side steps to [facility].  Ambulation/Gait                Stairs            Wheelchair Mobility    Modified Rankin (Stroke Patients Only)       Balance Overall balance assessment: Needs assistance Sitting-balance support: Feet unsupported Sitting balance-Leahy Scale: Fair     Standing balance support: Single extremity supported   Standing balance comment: reliant on external support                             Pertinent Vitals/Pain Faces Pain Scale: No hurt    Home Living Family/patient expects to be discharged to:: Skilled nursing facility Living Arrangements: Alone               Additional Comments: patient was living alone partner recently passed away, plans to go live with DTR in [place]. Has a son that lives locally    Prior Function           Comments: typcially does not use AD but has walker + cane at home. reports for ~1 week prior to admission was getting harder to get around     Hand Dominance        Extremity/Trunk Assessment   Upper Extremity Assessment Upper Extremity Assessment: Generalized weakness    Lower Extremity  Assessment Lower Extremity Assessment: Generalized weakness    Cervical / Trunk Assessment Cervical / Trunk Assessment: Normal  Communication   Communication: No difficulties  Cognition Arousal/Alertness: Awake/alert Behavior During Therapy: WFL for tasks assessed/performed Overall Cognitive Status: Within Functional Limits for tasks assessed                                        General Comments      Exercises     Assessment/Plan    PT Assessment Patient needs continued PT services  PT Problem List Decreased strength;Decreased mobility;Decreased activity tolerance;Cardiopulmonary status limiting activity       PT Treatment Interventions DME instruction;Therapeutic  activities;Gait training;Therapeutic exercise;Patient/family education;Functional mobility training    PT Goals (Current goals can be found in the Care Plan section)  Acute Rehab PT Goals Patient Stated Goal: feel better PT Goal Formulation: With patient Time For Goal Achievement: 10/09/20 Potential to Achieve Goals: Fair    Frequency Min 3X/week   Barriers to discharge Decreased caregiver support      Co-evaluation PT/OT/SLP Co-Evaluation/Treatment: Yes Reason for Co-Treatment: For patient/therapist safety PT goals addressed during session: Mobility/safety with mobility OT goals addressed during session: ADL's and self-care       AM-PAC PT "6 Clicks" Mobility  Outcome Measure Help needed turning from your back to your side while in a flat bed without using bedrails?: A Little Help needed moving from lying on your back to sitting on the side of a flat bed without using bedrails?: A Little Help needed moving to and from a bed to a chair (including a wheelchair)?: A Lot Help needed standing up from a chair using your arms (e.g., wheelchair or bedside chair)?: A Lot Help needed to walk in hospital room?: A Lot Help needed climbing 3-5 steps with a railing? : Total 6 Click Score: 13    End of Session Equipment Utilized During Treatment: Oxygen Activity Tolerance: Patient limited by fatigue;Treatment limited secondary to medical complications (Comment) Patient left: in bed;with call bell/phone within reach;with bed alarm set Nurse Communication: Mobility status PT Visit Diagnosis: Unsteadiness on feet (R26.81);Dizziness and giddiness (R42)    Time: 6270-3500 PT Time Calculation (min) (ACUTE ONLY): 21 min   Charges:   PT Evaluation $PT Eval Low Complexity: 1 Low          Blanchard Kelch PT Acute Rehabilitation Services Pager [phone] Office [phone]   [name] 09/25/2020, 3:47 PM

## 2020-09-25 NOTE — Progress Notes (Signed)
Rt placed pt back on BIPAP per MD order. No distress noted pt sleeping .

## 2020-09-26 ENCOUNTER — Other Ambulatory Visit: Payer: Self-pay | Admitting: Physician Assistant

## 2020-09-26 ENCOUNTER — Inpatient Hospital Stay (HOSPITAL_COMMUNITY): Payer: Medicare HMO

## 2020-09-26 DIAGNOSIS — R6520 Severe sepsis without septic shock: Secondary | ICD-10-CM

## 2020-09-26 DIAGNOSIS — J189 Pneumonia, unspecified organism: Secondary | ICD-10-CM | POA: Diagnosis not present

## 2020-09-26 DIAGNOSIS — J9621 Acute and chronic respiratory failure with hypoxia: Secondary | ICD-10-CM | POA: Diagnosis not present

## 2020-09-26 DIAGNOSIS — I517 Cardiomegaly: Secondary | ICD-10-CM | POA: Diagnosis not present

## 2020-09-26 DIAGNOSIS — I519 Heart disease, unspecified: Secondary | ICD-10-CM

## 2020-09-26 DIAGNOSIS — R7989 Other specified abnormal findings of blood chemistry: Secondary | ICD-10-CM | POA: Diagnosis not present

## 2020-09-26 DIAGNOSIS — J9601 Acute respiratory failure with hypoxia: Secondary | ICD-10-CM | POA: Diagnosis not present

## 2020-09-26 DIAGNOSIS — A419 Sepsis, unspecified organism: Principal | ICD-10-CM

## 2020-09-26 LAB — COMPREHENSIVE METABOLIC PANEL
ALT: 198 U/L — ABNORMAL HIGH (ref 0–44)
ALT: 191 U/L — ABNORMAL HIGH (ref 0–44)
ALT: 183 U/L — ABNORMAL HIGH (ref 0–44)
AST: 60 U/L — ABNORMAL HIGH (ref 15–41)
AST: 50 U/L — ABNORMAL HIGH (ref 15–41)
AST: 47 U/L — ABNORMAL HIGH (ref 15–41)
Albumin: 2.8 g/dL — ABNORMAL LOW (ref 3.5–5.0)
Albumin: 2.9 g/dL — ABNORMAL LOW (ref 3.5–5.0)
Albumin: 2.9 g/dL — ABNORMAL LOW (ref 3.5–5.0)
Alkaline Phosphatase: 91 U/L (ref 38–126)
Alkaline Phosphatase: 90 U/L (ref 38–126)
Alkaline Phosphatase: 92 U/L (ref 38–126)
Anion gap: 9 (ref 5–15)
Anion gap: 6 (ref 5–15)
Anion gap: 11 (ref 5–15)
BUN: 37 mg/dL — ABNORMAL HIGH (ref 8–23)
BUN: 36 mg/dL — ABNORMAL HIGH (ref 8–23)
BUN: 36 mg/dL — ABNORMAL HIGH (ref 8–23)
CO2: 32 mmol/L (ref 22–32)
CO2: 34 mmol/L — ABNORMAL HIGH (ref 22–32)
CO2: 34 mmol/L — ABNORMAL HIGH (ref 22–32)
Calcium: 8.6 mg/dL — ABNORMAL LOW (ref 8.9–10.3)
Calcium: 8.6 mg/dL — ABNORMAL LOW (ref 8.9–10.3)
Calcium: 8.9 mg/dL (ref 8.9–10.3)
Chloride: 101 mmol/L (ref 98–111)
Chloride: 100 mmol/L (ref 98–111)
Chloride: 95 mmol/L — ABNORMAL LOW (ref 98–111)
Creatinine, Ser: 0.58 mg/dL (ref 0.44–1.00)
Creatinine, Ser: 0.56 mg/dL (ref 0.44–1.00)
Creatinine, Ser: 0.65 mg/dL (ref 0.44–1.00)
GFR, Estimated: >60 mL/min (ref >60)
GFR, Estimated: >60 mL/min (ref >60)
GFR, Estimated: >60 mL/min (ref >60)
Glucose, Bld: 168 mg/dL — ABNORMAL HIGH (ref 70–99)
Glucose, Bld: 185 mg/dL — ABNORMAL HIGH (ref 70–99)
Glucose, Bld: 167 mg/dL — ABNORMAL HIGH (ref 70–99)
Potassium: 4.9 mmol/L (ref 3.5–5.1)
Potassium: 4.8 mmol/L (ref 3.5–5.1)
Potassium: 4.4 mmol/L (ref 3.5–5.1)
Sodium: 142 mmol/L (ref 135–145)
Sodium: 140 mmol/L (ref 135–145)
Sodium: 140 mmol/L (ref 135–145)
Total Bilirubin: 0.7 mg/dL (ref 0.3–1.2)
Total Bilirubin: 0.9 mg/dL (ref 0.3–1.2)
Total Bilirubin: 0.9 mg/dL (ref 0.3–1.2)
Total Protein: 6.5 g/dL (ref 6.5–8.1)
Total Protein: 6.7 g/dL (ref 6.5–8.1)
Total Protein: 7 g/dL (ref 6.5–8.1)

## 2020-09-26 LAB — MAGNESIUM: Magnesium: 2.8 mg/dL — ABNORMAL HIGH (ref 1.7–2.4)

## 2020-09-26 LAB — CBC
HCT: 51.3 % — ABNORMAL HIGH (ref 36.0–46.0)
Hemoglobin: 15.8 g/dL — ABNORMAL HIGH (ref 12.0–15.0)
MCH: 31.6 pg (ref 26.0–34.0)
MCHC: 30.8 g/dL (ref 30.0–36.0)
MCV: 102.6 fL — ABNORMAL HIGH (ref 80.0–100.0)
Platelets: 329 10*3/uL (ref 150–400)
RBC: 5 MIL/uL (ref 3.87–5.11)
RDW: 14.2 % (ref 11.5–15.5)
WBC: 14.2 10*3/uL — ABNORMAL HIGH (ref 4.0–10.5)
nRBC: 0 % (ref 0.0–0.2)

## 2020-09-26 LAB — PHOSPHORUS: Phosphorus: 3.6 mg/dL (ref 2.5–4.6)

## 2020-09-26 LAB — BRAIN NATRIURETIC PEPTIDE: B Natriuretic Peptide: 964.7 pg/mL — ABNORMAL HIGH (ref 0.0–100.0)

## 2020-09-26 MED ORDER — REVEFENACIN 175 MCG/3ML IN SOLN
175.0000 ug | Freq: Every day | RESPIRATORY_TRACT | Status: DC
  Filled 2020-09-26: qty 3

## 2020-09-26 MED ORDER — BUDESONIDE 0.5 MG/2ML IN SUSP
0.5000 mg | Freq: Two times a day (BID) | RESPIRATORY_TRACT | Status: DC
  Administered 2020-09-26 – 2020-10-17 (×42): 0.5 mg via RESPIRATORY_TRACT
  Filled 2020-09-26 (×45): qty 2

## 2020-09-26 MED ORDER — FUROSEMIDE 10 MG/ML IJ SOLN
80.0000 mg | INTRAMUSCULAR | Status: AC
  Administered 2020-09-26: 80 mg via INTRAVENOUS
  Filled 2020-09-26: qty 8

## 2020-09-26 MED ORDER — FUROSEMIDE 10 MG/ML IJ SOLN
80.0000 mg | Freq: Two times a day (BID) | INTRAMUSCULAR | Status: DC
  Administered 2020-09-27 – 2020-10-01 (×9): 80 mg via INTRAVENOUS
  Filled 2020-09-26 (×9): qty 8

## 2020-09-26 MED ORDER — FUROSEMIDE 10 MG/ML IJ SOLN: 80.0000 mg | Freq: Four times a day (QID) | INTRAMUSCULAR | Status: DC

## 2020-09-26 MED ORDER — BUDESONIDE 0.5 MG/2ML IN SUSP: 0.5000 mg | Freq: Two times a day (BID) | RESPIRATORY_TRACT | Status: DC

## 2020-09-26 MED ORDER — ARFORMOTEROL TARTRATE 15 MCG/2ML IN NEBU
15.0000 ug | INHALATION_SOLUTION | Freq: Two times a day (BID) | RESPIRATORY_TRACT | Status: DC
  Administered 2020-09-26 – 2020-10-11 (×30): 15 ug via RESPIRATORY_TRACT
  Filled 2020-09-26 (×29): qty 2

## 2020-09-26 MED ORDER — FUROSEMIDE 10 MG/ML IJ SOLN
80.0000 mg | Freq: Two times a day (BID) | INTRAMUSCULAR | Status: DC
  Administered 2020-09-26: 80 mg via INTRAVENOUS
  Filled 2020-09-26: qty 8

## 2020-09-26 MED ORDER — REVEFENACIN 175 MCG/3ML IN SOLN
175.0000 ug | Freq: Every day | RESPIRATORY_TRACT | Status: DC
  Administered 2020-09-27 – 2020-10-12 (×16): 175 ug via RESPIRATORY_TRACT
  Filled 2020-09-26 (×16): qty 3

## 2020-09-26 MED ORDER — METHYLPREDNISOLONE SODIUM SUCC 40 MG IJ SOLR
40.0000 mg | Freq: Two times a day (BID) | INTRAMUSCULAR | Status: DC
  Administered 2020-09-26 – 2020-10-01 (×10): 40 mg via INTRAVENOUS
  Filled 2020-09-26 (×8): qty 1

## 2020-09-26 MED ORDER — ARFORMOTEROL TARTRATE 15 MCG/2ML IN NEBU: 15.0000 ug | INHALATION_SOLUTION | Freq: Two times a day (BID) | RESPIRATORY_TRACT | Status: DC

## 2020-09-26 MED ORDER — LEVALBUTEROL HCL 1.25 MG/0.5ML IN NEBU: 1.2500 mg | INHALATION_SOLUTION | RESPIRATORY_TRACT | Status: DC | PRN

## 2020-09-26 NOTE — Progress Notes (Signed)
PROGRESS NOTE    Kimberly Ballard  CVE:[id number] DOB: 1940/04/17 DOA: 09/22/2020 PCP: System, Provider Not In  Brief Narrative:  The patient is 80 year old female with history of COPD presented with shortness of breath, body aches, decreased appetite and coughing.  Patients daughter was diagnosed with COVID last week.  She was started on Paxilovid empirically due to risk of exposure.  Patient continued to get worse and developed shortness of breath.  On EMS arrival she required 6 L/min of oxygen.  In the ED SARS-CoV-2 RT-PCR was negative.  Chest x-ray showed bilateral infiltrates, CTA was negative for PE but showed evidence of bilateral infiltrates.  Patient started on Rocephin and Zithromax for community-acquired pneumonia.  Subsequently she decompensated and worsened and went into A. fib with RVR and had to be placed on BiPAP and she is subsequently moved to the stepdown unit.  We are empirically treating for COVID and pulmonary has been consulted as well as cardiology.  Cardiology started the patient on Cardizem drip and pulmonary recommending continuing steroids.  She is weaned to 15 L nonrebreather and remains in A. fib without RVR so her Cardizem is being changed to 180 mg p.o. daily and changing heparin to Eliquis and discontinue aspirin.  Cardiology recommends continue Lasix but now the dose is changed to IV Lasix twice daily 80 mg.  She was given IV Lasix 40 mg yesterday.  Cardiology feels that she has some diastolic CHF in the setting of A. fib with RVR and they are planning on repeating echo in 6 weeks.  Cardiology recommends continuing Cardizem 120 mg daily and Eliquis 5 mg twice daily.      Assessment & Plan:   Active Problems:   CAP (community acquired pneumonia)   Acute respiratory failure with hypoxia and hypercapnia (HCC)   Sepsis ([place])   Elevated LFTs   Cardiomegaly   Elevated troponin   Atrial fibrillation (HCC)   Acute and chronic respiratory failure (acute-on-chronic)  (HCC)  Acute respiratory failure with hypoxia requiring noninvasive positive pressure ventilation with BiPAP 12/6 60% FiO2 -Multifactorial from underlying multifocal pneumonia, diastolic CHF -Patient is currently on Rocephin and Zithromax and will continue -Follow blood culture results -She is given IV Lasix 40 mg daily for last 3 days but now she will be getting IV 80 mg twice daily per pulmonary recommendations -CTA of the Chest PE protocol done and showed "No evidence of acute pulmonary embolism. Patchy consolidative opacities in the lung bases and  lingula have slightly worsened compared to the prior study and remain concerning for multifocal pneumonia, with differential including COVID. As before, follow-up chest CT in 3 months is recommended to assess for resolution. Unchanged cardiomegaly with evidence of right heart failure and pulmonary hypertension as above. Coronary artery calcifications and Aortic Atherosclerosis (ICD10-I70.0). -SpO2: 94 % O2 Flow Rate (L/min): 35 L/min FiO2 (%): 60 %; was on 15 L nonrebreather -Inflammatory Markers Recent Labs    09/23/20 1844 09/24/20 0323 09/25/20 0838  DDIMER 2.04*  --   --   CRP  --  28.0* 12.3*   -ABG    Component Value Date/Time   PHART 7.325 (L) 09/25/2020 0320   PCO2ART 67.6 (HH) 09/25/2020 0320   PO2ART 130 (H) 09/25/2020 0320   HCO3 34.2 (H) 09/25/2020 0320   O2SAT 98.6 09/25/2020 0320      Lab Results  Component Value Date   SARSCOV2NAA NEGATIVE 09/24/2020   [place] NEGATIVE 09/22/2020  -Leukocytosis is resolving and went from 18.6 is now 14.2 -  Speech therapy consulted for swallow evaluation -Appreciate PCCM consult and they placed the patient on BiPAP but has now been weaned to 15 L; pulmonary feels that this is clinically COVID given that hypoxemia and hypercapnia however now her isolation precautions have been discontinued -Pulmonary recommends continuing BiPAP nightly and 2 hours twice a day -They are  recommending nebs -They are also recommending steroids for COPD and suspected COVID and today is day 4 of 5 of systemic steroids and pulmonary does not think this is helped much -She remains on IV ceftriaxone and azithromycin has now been discontinued. -Continue with IV Lasix per pulmonary recommendations -Clear liquid diet was initiated yesterday but now is advance to oral diet -Pulmonary recommends continue diuresis  A. fib with RVR; now RVR is improved -Heart rates were ranging in the mid 100s and now in the 90s -She was placed on a Cardizem drip and now cardiology had changed to 180 mg p.o. Cardizem -Currently on anticoagulation with IV heparin given her CHA2DS2-VASc score of 3 but will now be changed to p.o. Eliquis by cardiology -Continue to monitor on telemetry and cardiology recommends decreasing the Cardizem to 120 mg daily and continuing p.o. Eliquis  AKI -Resolving; BUN/Cr is now 37/0.58 -Patient's BUNs/creatinine was elevated to 69/1.28 -Avoid nephrotoxic medications, contrast dyes, hypotension and renally dose medications -Continue monitor and trend renal function and repeat CMP in a.m.   Acute Diastolic CHF/right ventricular function/pulmonary hypertension -Echocardiogram is currently pending -She has bilateral crackles on auscultation -Cardiology recommends continuing diuresis with IV Lasix -BNP was elevated but cardiology feels that this may be underlying related to respiratory issues -Strict I's and O's and daily weights and she is now finally -844 mL since admission -Given her history of COPD with acute exacerbation multifocal pneumonia, cardiology feels that the hypoxemia is resulting in acute right ventricular strain with right ventricular dilatation and right ventricular dysfunction -Patient has moderate pulm hypertension on echo related to underlying COPD with acute exacerbation in the setting of hypoxemia -Repeat CTA PE as above -BNP was elevated at 889.7 and  worsened and BNP was elevated today to 964.7 -Cardiology has been consulted and appreciate further care per them   Elevated Troponin -Likely demand ischemia -hSTrop 12 -> 100 and then trended down to 52 -EKG shows T wave abnormalities in anterior chest leads -Cardiology has been consulted and have now signed off the case   Abnormal LFTs/Transaminitis -Unclear etiology -?  Passive liver congestion from CHF -Total bilirubin is normal, alk phos is normal -LFTs on admission showed an AST of 503 and ALT of 439; now improving as AST is now 50 and ALT is 191   Right middle lobe nodule -Will need repeat CT in 3 months after treatment of pneumonia   Obesity -Complicates overall prognosis and care -Estimated body mass index is 35.5 kg/m as calculated from the following:   Height as of this encounter: 5' 3"  (1.6 m).   Weight as of this encounter: 90.9 kg. -Weight Loss and Dietary Counseling   DVT prophylaxis: Anticoagulated with heparin drip Code Status: FULL CODE  Family Communication: No family currently at bedside Disposition Plan: Continue to monitor in the stepdown unit  Status is: Inpatient  Remains inpatient appropriate because:Unsafe d/c plan, IV treatments appropriate due to intensity of illness or inability to take PO, and Inpatient level of care appropriate due to severity of illness  Dispo: The patient is from: Home  Anticipated d/c is to:  TBD              Patient currently is not medically stable to d/c.   Difficult to place patient No   Consultants:  Pulmonary/PCCM Cardiology  Procedures:  ECHOCARDIOGRAM IMPRESSIONS     1. Left ventricular ejection fraction, by estimation, is 55 to 60%. Left  ventricular ejection fraction by 3D volume is 59 %. The left ventricle has  normal function. The left ventricle has no regional wall motion  abnormalities. There is moderate left  ventricular hypertrophy. Left ventricular diastolic parameters are  consistent  with Grade I diastolic dysfunction (impaired relaxation).   2. Right ventricular systolic function mildly reduced at base and mid,  with relative preserved apical systolic function. The right ventricular  size is moderately enlarged. There is moderately elevated pulmonary artery  systolic pressure. The estimated  right ventricular systolic pressure is 12.8 mmHg.   3. Left atrial size was mildly dilated.   4. Right atrial size was moderately dilated.   5. The mitral valve is normal in structure. Trivial mitral valve  regurgitation. No evidence of mitral stenosis.   6. Tricuspid valve regurgitation is mild to moderate.   7. The aortic valve is normal in structure. Aortic valve regurgitation is  not visualized. No aortic stenosis is present.   8. The inferior vena cava is dilated in size with <50% respiratory  variability, suggesting right atrial pressure of 15 mmHg.   FINDINGS   Left Ventricle: Left ventricular ejection fraction, by estimation, is 55  to 60%. Left ventricular ejection fraction by 3D volume is 59 %. The left  ventricle has normal function. The left ventricle has no regional wall  motion abnormalities. The left  ventricular internal cavity size was normal in size. There is moderate  left ventricular hypertrophy. Left ventricular diastolic parameters are  consistent with Grade I diastolic dysfunction (impaired relaxation).   Right Ventricle: Calcified moderate band. The right ventricular size is  moderately enlarged. No increase in right ventricular wall thickness.  Right ventricular systolic function mildly reduced at base and mid, with  relative preserved apical systolic  function. There is moderately elevated pulmonary artery systolic pressure.  The tricuspid regurgitant velocity is 2.89 m/s, and with an assumed right  atrial pressure of 15 mmHg, the estimated right ventricular systolic  pressure is 78.6 mmHg.   Left Atrium: Left atrial size was mildly dilated.    Right Atrium: Right atrial size was moderately dilated.   Pericardium: There is no evidence of pericardial effusion. Presence of  pericardial fat pad.   Mitral Valve: The mitral valve is normal in structure. Mild mitral annular  calcification. Trivial mitral valve regurgitation. No evidence of mitral  valve stenosis.   Tricuspid Valve: The tricuspid valve is normal in structure. Tricuspid  valve regurgitation is mild to moderate. No evidence of tricuspid  stenosis.   Aortic Valve: The aortic valve is normal in structure. Aortic valve  regurgitation is not visualized. No aortic stenosis is present.   Pulmonic Valve: The pulmonic valve was normal in structure. Pulmonic valve  regurgitation is trivial. No evidence of pulmonic stenosis.   Aorta: Aorta dimension upper limit of normal for age and BSA. The aortic  root is normal in size and structure.   Venous: The inferior vena cava is dilated in size with less than 50%  respiratory variability, suggesting right atrial pressure of 15 mmHg.   IAS/Shunts: The interatrial septum appears to be lipomatous. No atrial  level shunt detected by color flow Doppler.      LEFT VENTRICLE  PLAX 2D  LVIDd:         3.60 cm         Diastology  LVIDs:         2.50 cm         LV e' medial:    6.31 cm/s  LV PW:         1.30 cm         LV E/e' medial:  9.0  LV IVS:        1.30 cm         LV e' lateral:   10.20 cm/s  LVOT diam:     2.00 cm         LV E/e' lateral: 5.6  LV SV:         54  LV SV Index:   28  LVOT Area:     3.14 cm        3D Volume EF                                 LV 3D EF:    Left                                              ventricul  LV Volumes (MOD)                            ar  LV vol d, MOD    73.9 ml                    ejection  A2C:                                        fraction  LV vol d, MOD    53.3 ml                    by 3D  A4C:                                        volume is  LV vol s, MOD    28.2 ml                     59 %.  A2C:  LV vol s, MOD    20.1 ml  A4C:                           3D Volume EF:  LV SV MOD A2C:   45.7 ml       3D EF:        59 %  LV SV MOD A4C:   53.3 ml  LV SV MOD BP:    38.1 ml   RIGHT VENTRICLE             IVC  RV S prime:     10.60 cm/s  IVC diam: 2.90 cm  TAPSE (M-mode): 1.8 cm  LEFT ATRIUM             Index       RIGHT ATRIUM           Index  LA diam:        3.90 cm 2.02 cm/m  RA Area:     25.80 cm  LA Vol (A2C):   33.2 ml 17.16 ml/m RA Volume:   96.90 ml  50.07 ml/m  LA Vol (A4C):   54.8 ml 28.32 ml/m  LA Biplane Vol: 43.4 ml 22.43 ml/m   AORTIC VALVE             PULMONIC VALVE  LVOT Vmax:   95.70 cm/s  PR End Diast Vel: 1.35 msec  LVOT Vmean:  61.300 cm/s  LVOT VTI:    0.172 m     AORTA  Ao Root diam: 3.70 cm  Ao Asc diam:  3.80 cm   MITRAL VALVE               TRICUSPID VALVE  MV Area (PHT): 2.95 cm    TV Peak grad:   34.6 mmHg  MV Decel Time: 257 msec    TV Vmax:        2.94 m/s  MV E velocity: 56.80 cm/s  TR Peak grad:   33.4 mmHg  MV A velocity: 82.10 cm/s  TR Vmax:        289.00 cm/s  MV E/A ratio:  0.69                             SHUNTS                             Systemic VTI:  0.17 m                             Systemic Diam: 2.00 cm   Antimicrobials:  Anti-infectives (From admission, onward)    Start     Dose/Rate Route Frequency Ordered Stop   09/23/20 1800  cefTRIAXone (ROCEPHIN) 2 g in sodium chloride 0.9 % 100 mL IVPB        2 g 200 mL/hr over 30 Minutes Intravenous Every 24 hours 09/22/20 1842 09/30/20 1759   09/23/20 1800  azithromycin (ZITHROMAX) 500 mg in sodium chloride 0.9 % 250 mL IVPB  Status:  Discontinued        500 mg 250 mL/hr over 60 Minutes Intravenous Every 24 hours 09/22/20 1842 09/25/20 1456   09/22/20 1630  cefTRIAXone (ROCEPHIN) 1 g in sodium chloride 0.9 % 100 mL IVPB        1 g 200 mL/hr over 30 Minutes Intravenous  Once 09/22/20 1618 09/22/20 1806   09/22/20 1630  azithromycin (ZITHROMAX) 500 mg in  sodium chloride 0.9 % 250 mL IVPB        500 mg 250 mL/hr over 60 Minutes Intravenous  Once 09/22/20 1618 09/22/20 1939        Subjective: Seen and examined and she was off of the BiPAP and on 15 L high flow nasal cannula.  No chest pain or shortness of breath.  Feels okay.  Thinks her shortness of breath is improved slightly.  No other concerns or complaints at this time.  Objective: Vitals:   09/26/20 1217 09/26/20 1300 09/26/20 1400 09/26/20 1600  BP:  133/73  Pulse:  85 79   Resp:  18 (!) 23   Temp: 98 F (36.7 C)   (!) 97.4 F (36.3 C)  TempSrc: Oral   Oral  SpO2:  95% 94%   Weight:      Height:        Intake/Output Summary (Last 24 hours) at 09/26/2020 1735 Last data filed at 09/26/2020 1200 Gross per 24 hour  Intake 182.3 ml  Output 1450 ml  Net -1267.7 ml    Filed Weights   09/22/20 2020  Weight: 90.9 kg   Examination: Physical Exam:  Constitutional: WN/WD obese Caucasian female currently in mild respiratory distress no longer wearing BiPAP now  Eyes: Lids and conjunctivae normal, sclerae anicteric  ENMT: External Ears, Nose appear normal.  A little hard of hearing Neck: Appears normal, supple, no cervical masses, normal ROM, no appreciable thyromegaly; mild JVD Respiratory: Diminished to auscultation bilaterally with coarse breath sounds and some crackles, no wheezing, rales, rhonchi or crackles.  Slightly tachypneic and using some accessory muscles of breathing on 15 L high flow nasal cannula Cardiovascular: Irregularly irregular and tachycardic, no murmurs / rubs / gallops. S1 and S2 auscultated.  1+ lower extremity edema Abdomen: Soft, non-tender, distended secondary body habitus. Bowel sounds positive.  GU: Deferred. Musculoskeletal: No clubbing / cyanosis of digits/nails. No joint deformity upper and lower extremities.  Skin: No rashes, lesions, ulcers on limited skin evaluation. No induration; Warm and dry.  Neurologic: CN 2-12 grossly intact with no  focal deficits. Romberg sign and cerebellar reflexes not assessed.  Psychiatric: Normal judgment and insight. Alert and oriented x 3. A little anxious mood and appropriate affect.   Data Reviewed: I have personally reviewed following labs and imaging studies  CBC: Recent Labs  Lab 09/22/20 1441 09/23/20 0207 09/24/20 0323 09/25/20 0447 09/26/20 0303  WBC 21.6* 19.7* 16.5* 18.6* 14.2*  NEUTROABS 16.7* 16.9*  --   --   --   HGB 15.6* 15.7* 15.4* 15.4* 15.8*  HCT 47.4* 49.4* 49.4* 49.7* 51.3*  MCV 98.3 100.6* 100.8* 102.5* 102.6*  PLT 283 253 318 349 220    Basic Metabolic Panel: Recent Labs  Lab 09/22/20 1500 09/23/20 0207 09/24/20 0323 09/25/20 0447 09/26/20 0303 09/26/20 0903  NA  --  139 141 142 142 140  K  --  3.9 3.6 4.6 4.9 4.8  CL  --  102 99 104 101 100  CO2  --  25 30 33* 32 34*  GLUCOSE  --  141* 187* 178* 168* 185*  BUN  --  69* 58* 43* 37* 36*  CREATININE  --  1.04* 0.75 0.68 0.58 0.56  CALCIUM  --  8.3* 8.3* 8.4* 8.6* 8.6*  MG 2.6* 2.7* 2.8* 2.7* 2.8*  --   PHOS 3.8 3.5  --  3.2 3.6  --     GFR: Estimated Creatinine Clearance: 61 mL/min (by C-G formula based on SCr of 0.56 mg/dL). Liver Function Tests: Recent Labs  Lab 09/23/20 0207 09/24/20 0323 09/25/20 0838 09/26/20 0303 09/26/20 0903  AST 170* 91* 87* 60* 50*  ALT 327* 264* 228* 198* 191*  ALKPHOS 97 112 97 91 90  BILITOT 1.1 0.8 0.7 0.7 0.9  PROT 6.8 7.0 6.5 6.5 6.7  ALBUMIN 3.1* 2.9* 2.7* 2.8* 2.9*    No results for input(s): LIPASE, AMYLASE in the last 168 hours. No results for input(s): AMMONIA in the last 168 hours. Coagulation Profile: Recent Labs  Lab 09/22/20 2050  INR 1.3*  Cardiac Enzymes: Recent Labs  Lab 09/22/20 2148  CKTOTAL 60    BNP (last 3 results) No results for input(s): PROBNP in the last 8760 hours. HbA1C: No results for input(s): HGBA1C in the last 72 hours. CBG: No results for input(s): GLUCAP in the last 168 hours. Lipid Profile: No results for  input(s): CHOL, HDL, LDLCALC, TRIG, CHOLHDL, LDLDIRECT in the last 72 hours.  Thyroid Function Tests: No results for input(s): TSH, T4TOTAL, FREET4, T3FREE, THYROIDAB in the last 72 hours.  Anemia Panel: No results for input(s): VITAMINB12, FOLATE, FERRITIN, TIBC, IRON, RETICCTPCT in the last 72 hours.  Sepsis Labs: Recent Labs  Lab 09/22/20 1441 09/23/20 0207 09/24/20 0323  PROCALCITON 0.48 0.49 0.23  LATICACIDVEN 1.7  --   --      Recent Results (from the past 240 hour(s))  Resp Panel by RT-PCR (Flu A&B, Covid) Nasopharyngeal Swab     Status: None   Collection Time: 09/22/20  2:41 PM   Specimen: Nasopharyngeal Swab; Nasopharyngeal(NP) swabs in vial transport medium  Result Value Ref Range Status   SARS Coronavirus 2 by RT PCR NEGATIVE NEGATIVE Final    Comment: (NOTE) SARS-CoV-2 target nucleic acids are NOT DETECTED.  The SARS-CoV-2 RNA is generally detectable in upper respiratory specimens during the acute phase of infection. The lowest concentration of SARS-CoV-2 viral copies this assay can detect is 138 copies/mL. A negative result does not preclude SARS-Cov-2 infection and should not be used as the sole basis for treatment or other patient management decisions. A negative result may occur with  improper specimen collection/handling, submission of specimen other than nasopharyngeal swab, presence of viral mutation(s) within the areas targeted by this assay, and inadequate number of viral copies(<138 copies/mL). A negative result must be combined with clinical observations, patient history, and epidemiological information. The expected result is Negative.  Fact Sheet for Patients:  EntrepreneurPulse.com.au  Fact Sheet for Healthcare Providers:  IncredibleEmployment.be  This test is no t yet approved or cleared by the [place] FDA and  has been authorized for detection and/or diagnosis of SARS-CoV-2 by FDA under an Emergency  Use Authorization (EUA). This EUA will remain  in effect (meaning this test can be used) for the duration of the COVID-19 declaration under Section 564(b)(1) of the Act, 21 U.S.C.section 360bbb-3(b)(1), unless the authorization is terminated  or revoked sooner.       Influenza A by PCR NEGATIVE NEGATIVE Final   Influenza B by PCR NEGATIVE NEGATIVE Final    Comment: (NOTE) The Xpert Xpress SARS-CoV-2/FLU/RSV plus assay is intended as an aid in the diagnosis of influenza from Nasopharyngeal swab specimens and should not be used as a sole basis for treatment. Nasal washings and aspirates are unacceptable for Xpert Xpress SARS-CoV-2/FLU/RSV testing.  Fact Sheet for Patients: EntrepreneurPulse.com.au  Fact Sheet for Healthcare Providers: IncredibleEmployment.be  This test is not yet approved or cleared by the [place] FDA and has been authorized for detection and/or diagnosis of SARS-CoV-2 by FDA under an Emergency Use Authorization (EUA). This EUA will remain in effect (meaning this test can be used) for the duration of the COVID-19 declaration under Section 564(b)(1) of the Act, 21 U.S.C. section 360bbb-3(b)(1), unless the authorization is terminated or revoked.  Performed at [facility], [place] [address], [place], [place] [address]   Blood Culture (routine x 2)     Status: None (Preliminary result)   Collection Time: 09/22/20  2:41 PM   Specimen: BLOOD  Result Value Ref Range Status  Specimen Description   Final    BLOOD BLOOD LEFT FOREARM Performed at [place] [address] [place], [place] [address]    Special Requests   Final    BOTTLES DRAWN AEROBIC AND ANAEROBIC Blood Culture adequate volume Performed at [place] [address] [place], [place] [address]    Culture   Final    NO GROWTH 4 DAYS Performed at [facility], [place] [address], [place],  [place] [address]    Report Status PENDING  Incomplete  Blood Culture (routine x 2)     Status: None (Preliminary result)   Collection Time: 09/22/20  5:20 PM   Specimen: BLOOD LEFT HAND  Result Value Ref Range Status   Specimen Description   Final    BLOOD LEFT HAND Performed at [facility], [place] [address], [place], [place] [address]    Special Requests   Final    BOTTLES DRAWN AEROBIC AND ANAEROBIC Blood Culture results may not be optimal due to an excessive volume of blood received in culture bottles Performed at [place] [address], [place], [place] [address]    Culture   Final    NO GROWTH 4 DAYS Performed at [facility], [place] [address] [place], [place] [address]    Report Status PENDING  Incomplete  Respiratory (~20 pathogens) panel by PCR     Status: None   Collection Time: 09/22/20 10:47 PM   Specimen: Nasopharyngeal Swab; Respiratory  Result Value Ref Range Status   Adenovirus NOT DETECTED NOT DETECTED Final   Coronavirus 229E NOT DETECTED NOT DETECTED Final    Comment: (NOTE) The Coronavirus on the Respiratory Panel, DOES NOT test for the novel  Coronavirus (2019 nCoV)    Coronavirus HKU1 NOT DETECTED NOT DETECTED Final   Coronavirus NL63 NOT DETECTED NOT DETECTED Final   Coronavirus OC43 NOT DETECTED NOT DETECTED Final   Metapneumovirus NOT DETECTED NOT DETECTED Final   Rhinovirus / Enterovirus NOT DETECTED NOT DETECTED Final   Influenza A NOT DETECTED NOT DETECTED Final   Influenza B NOT DETECTED NOT DETECTED Final   Parainfluenza Virus 1 NOT DETECTED NOT DETECTED Final   Parainfluenza Virus 2 NOT DETECTED NOT DETECTED Final   Parainfluenza Virus 3 NOT DETECTED NOT DETECTED Final   Parainfluenza Virus 4 NOT DETECTED NOT DETECTED Final   Respiratory Syncytial Virus NOT DETECTED NOT DETECTED Final   Bordetella pertussis NOT DETECTED NOT DETECTED Final   Bordetella Parapertussis NOT DETECTED NOT DETECTED Final   Chlamydophila  pneumoniae NOT DETECTED NOT DETECTED Final   Mycoplasma pneumoniae NOT DETECTED NOT DETECTED Final    Comment: Performed at [facility], [place]. [address] [place], [place] [address]  MRSA Next Gen by PCR, Nasal     Status: None   Collection Time: 09/24/20  5:24 AM   Specimen: Nasal Mucosa; Nasal Swab  Result Value Ref Range Status   MRSA by PCR Next Gen NOT DETECTED NOT DETECTED Final    Comment: (NOTE) The GeneXpert MRSA Assay (FDA approved for NASAL specimens only), is one component of a comprehensive MRSA colonization surveillance program. It is not intended to diagnose MRSA infection nor to guide or monitor treatment for MRSA infections. Test performance is not FDA approved in patients less than 73 years old. Performed at [facility], [place] [address] [place], [place] [address]   Resp Panel by RT-PCR (Flu A&B, Covid) Nasopharyngeal Swab  Status: None   Collection Time: 09/24/20  2:21 PM   Specimen: Nasopharyngeal Swab; Nasopharyngeal(NP) swabs in vial transport medium  Result Value Ref Range Status   SARS Coronavirus 2 by RT PCR NEGATIVE NEGATIVE Final    Comment: (NOTE) SARS-CoV-2 target nucleic acids are NOT DETECTED.  The SARS-CoV-2 RNA is generally detectable in upper respiratory specimens during the acute phase of infection. The lowest concentration of SARS-CoV-2 viral copies this assay can detect is 138 copies/mL. A negative result does not preclude SARS-Cov-2 infection and should not be used as the sole basis for treatment or other patient management decisions. A negative result may occur with  improper specimen collection/handling, submission of specimen other than nasopharyngeal swab, presence of viral mutation(s) within the areas targeted by this assay, and inadequate number of viral copies(<138 copies/mL). A negative result must be combined with clinical observations, patient history, and epidemiological information. The expected result is  Negative.  Fact Sheet for Patients:  EntrepreneurPulse.com.au  Fact Sheet for Healthcare Providers:  IncredibleEmployment.be  This test is no t yet approved or cleared by the [place] FDA and  has been authorized for detection and/or diagnosis of SARS-CoV-2 by FDA under an Emergency Use Authorization (EUA). This EUA will remain  in effect (meaning this test can be used) for the duration of the COVID-19 declaration under Section 564(b)(1) of the Act, 21 U.S.C.section 360bbb-3(b)(1), unless the authorization is terminated  or revoked sooner.       Influenza A by PCR NEGATIVE NEGATIVE Final   Influenza B by PCR NEGATIVE NEGATIVE Final    Comment: (NOTE) The Xpert Xpress SARS-CoV-2/FLU/RSV plus assay is intended as an aid in the diagnosis of influenza from Nasopharyngeal swab specimens and should not be used as a sole basis for treatment. Nasal washings and aspirates are unacceptable for Xpert Xpress SARS-CoV-2/FLU/RSV testing.  Fact Sheet for Patients: EntrepreneurPulse.com.au  Fact Sheet for Healthcare Providers: IncredibleEmployment.be  This test is not yet approved or cleared by the [place] FDA and has been authorized for detection and/or diagnosis of SARS-CoV-2 by FDA under an Emergency Use Authorization (EUA). This EUA will remain in effect (meaning this test can be used) for the duration of the COVID-19 declaration under Section 564(b)(1) of the Act, 21 U.S.C. section 360bbb-3(b)(1), unless the authorization is terminated or revoked.  Performed at [facility], [place] [address] [place], [place] [address]       RN Pressure Injury Documentation:     Estimated body mass index is 35.5 kg/m as calculated from the following:   Height as of this encounter: 5' 3"  (1.6 m).   Weight as of this encounter: 90.9 kg.  Malnutrition Type:      Malnutrition  Characteristics:      Nutrition Interventions:        Radiology Studies: DG CHEST PORT 1 VIEW  Result Date: 09/26/2020 CLINICAL DATA:  Respiratory failure, history COPD EXAM: PORTABLE CHEST 1 VIEW COMPARISON:  Portable exam 0459 hours compared 09/25/2020 FINDINGS: Enlargement of cardiac silhouette with pulmonary vascular congestion. Atherosclerotic calcification aorta. BILATERAL pulmonary infiltrates greatest at bases. This could represent pulmonary edema or multifocal pneumonia. Probable RIGHT pleural effusion. No pneumothorax. Bones demineralized. IMPRESSION: Enlargement of cardiac silhouette with pulmonary vascular congestion. Persistent BILATERAL pulmonary infiltrates greatest at bases question pulmonary edema versus multifocal infection. Probable RIGHT pleural effusion. Electronically Signed   By: Lavonia Dana M.D.   On: 09/26/2020 08:21   DG CHEST PORT 1 VIEW  Result Date: 09/25/2020 CLINICAL DATA:  Shortness of breath, body aches, decreased appetite, and cough. COVID diagnosis. EXAM: PORTABLE CHEST 1 VIEW COMPARISON:  Chest 09/22/2020.  CT chest 09/24/2020 FINDINGS: Cardiac enlargement. Prominent central hilar structures. Bilateral perihilar and basilar infiltrates could represent pneumonia or edema. Suggestion of developing density in the left costophrenic angle which may represent consolidation. Calcification of the aorta. No pneumothorax. IMPRESSION: Cardiac enlargement with prominent central hilar structures corresponding to vascular prominence on CT. Bilateral perihilar infiltrates. Suggestion of progressing consolidation in the left base. Electronically Signed   By: Lucienne Capers M.D.   On: 09/25/2020 17:28    Scheduled Meds:  apixaban  5 mg Oral BID   arformoterol  15 mcg Nebulization BID   budesonide (PULMICORT) nebulizer solution  0.5 mg Nebulization BID   Chlorhexidine Gluconate Cloth  6 each Topical Daily   diltiazem  120 mg Oral Daily   [START ON 09/27/2020] furosemide   80 mg Intravenous BID   guaiFENesin  600 mg Oral BID   mouth rinse  15 mL Mouth Rinse BID   methylPREDNISolone (SOLU-MEDROL) injection  40 mg Intravenous Q12H   [START ON 09/27/2020] revefenacin  175 mcg Nebulization Daily   Continuous Infusions:  cefTRIAXone (ROCEPHIN)  IV 2 g (09/26/20 1719)    LOS: 4 days   Kerney Elbe, DO [facility] PAGER is on AMION  If 7PM-7AM, please contact night-coverage [email or web address]

## 2020-09-26 NOTE — Progress Notes (Addendum)
NAME:  Kimberly Ballard, MRN:  [id number], DOB:  30-Aug-1940, LOS: 4 ADMISSION DATE:  09/22/2020, CONSULTATION DATE:  09/23/20 REFERRING MD:  Adela Glimpse CHIEF COMPLAINT: Dyspnea, cough   History of Present Illness:  Kimberly Ballard is a 80 y.o. female who presented to [facility] ED on 8/22 with dyspnea, body aches, cough.  Her daughter had apparently tested positive for COVID 1 week earlier.  She saw her PCP and was empirically started on Paxlovid due to her exposure of which she took 2 doses.  She is not vaccinated for COVID. Symptoms progressed to the point that she felt she needed to seek medical attention; therefore, she called EMS. -On EMS arrival, she was found to be hypoxic in the 70s she was subsequently placed on 6 L of oxygen with improvement in sats to 94%. -In ED, she was found to have leukocytosis, hypoxic and hypercapnic respiratory failure, and probable community-acquired pneumonia on imaging. -She was admitted by [facility] and on 8/23, PCCM asked to see in consultation.   Pertinent  Medical History:  has CAP (community acquired pneumonia); Acute respiratory failure with hypoxia and hypercapnia (HCC); Sepsis (HCC); Elevated LFTs; Cardiomegaly; Elevated troponin; Atrial fibrillation (HCC); and Acute and chronic respiratory failure (acute-on-chronic) (HCC) on their problem list.  She is a former smoker.  Smoked around 1ppd and quit some 5 - 6 years ago.  She has never seen a pulmonologist and / or had PFT's performed.  Significant Hospital Events: Including procedures, antibiotic start and stop dates in addition to other pertinent events   8/22 > admit.  Admitted to stepdown unit.  Cough weak.  Pulmonary asked to evaluate given concern for progressive respiratory failure.  Initial COVID by respiratory panel was negative.  As was influenza.  Respiratory viral panel negative.  Still placed on isolation protocol given history.  IV ceftriaxone and azithromycin initiated.  Given IV Solu-Medrol followed by oral  prednisone order. 8/23 pulmonary asked to evaluate given worsening respiratory failure.  CT chest showing lower lobe consolidation.  There was concern about possible aspiration.  Echo obtained showed good left ventricular function with mild pulmonary artery hypertension. 8/24 cardiology consulted for elevated troponins.  This was felt secondary to worsening hypoxia and underlying pulmonary artery hypertension which was felt previously undiagnosed issue.  New onset atrial fibrillation with RVR.  CT angiogram ordered.  Increased oxygen requirement overnight, requiring BiPAP, later placed on 15 L/min via high flow.  Lower extremity ultrasound negative for DVT 8/25: u strep neg.  still high FIO2. F/u COVID neg. Transitioned to oral CCB and DOAC. Getting IV lasix. Transitioned to headed High flow [place] during day and BIPAP at night.  8/26 still w/ overall net positive fluid balance over hospital stay. CXR w/ worse edema. Lasix increased to 80 mg q 12. Azithromycin stopped (day 5) Steroids reduced.  Cardiology signed off. (See note from cards w/ plan to follow as out-pt)   Interim History / Subjective:  No distress  Objective:  Blood pressure 131/80, pulse 87, temperature (Abnormal) 97.4 F (36.3 C), temperature source Axillary, resp. rate (Abnormal) 23, height 5\' 3"  (1.6 m), weight 90.9 kg, SpO2 92 %.    FiO2 (%):  [60 %-100 %] 60 %   Intake/Output Summary (Last 24 hours) at 09/26/2020 0832 Last data filed at 09/26/2020 0402 Gross per 24 hour  Intake 782.74 ml  Output 1850 ml  Net -1067.26 ml   Filed Weights   09/22/20 2020  Weight: 90.9 kg    Examination: General:  This is a very pleasant 80 year old female she is currently resting in bed in acute distress HEENT normocephalic/atraumatic.  Mucous membranes are moist speech is strong she is hard of hearing at baseline Pulmonary: Basilar rales appreciated.  Currently on 60% heated high flow with flow rate of 30 L/min.  There is no accessory use  appreciated on resting exam.  She is able to speak in full sentences Cardiac: Regular irregular with atrial fibrillation noted on telemetry Abdomen: Soft not tender no organomegaly Extremities: Bilateral lower extremity edema persists Neuro awake oriented and without focal deficits GU clear yellow urine, note still has positive I&O balance over course of hospitalization  Labs/imaging personally reviewed:  CTA chest 8/22 > multifocal PNA, probable reactive bilateral hilar lymph nodes, cardiomegaly, PAH, possible nodule.  Assessment & Plan:    Acute on chronic hypercapnic and probable chronic hypoxic respiratory failure - likely combination of CAP +/- underlying COPD though no obvious emphysema on CT; but driving issue as of 9/79 and 8/26 is pulmonary edema  PCXR w/ worsening bilateral airspace disease/edema w basilar consolidation  Plan Day #4 of 5 systemic steroids, I have decreased the dosing.  I do not think this is helped much  Day 5 azithro (stop after today) Day 5 ceftriaxone; will complete 7 days  Continue heated high flow and wean for sats > 90% BIPAP at HS Push IV lasix today (bumped to 80mg  q 12), we need to get a negative volume status on her  OOB as tolerated.   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 -Seen by cardiology felt likely acute on chronic with acute hypoxia worsening current picture -Troponins are trending down Plan Tele  Treat respiratory failure  Additional recs per cards   Atrial fibrillation with rapid ventricular response Seen by cardiology, CHA2DS2-VASc score 3 Plan Oral calcium channel blocker and DOAC as directed by cardiology  Continuing telemetry monitoring  Ensure magnesium greater than 2 and potassium greater than 4    Steroid-induced hyperglycemia Plan ssi  RML nodule Plan F/U CT scan 3 mo   Best practice (evaluated daily):  Per primary team.  My cct 32 min   ACNP-BC [facility]  Pulmonary/Critical Care Pager # [phone] OR # [phone] if no answer

## 2020-09-26 NOTE — Progress Notes (Signed)
ll

## 2020-09-26 NOTE — TOC Progression Note (Signed)
Transition of Care [facility]) - Progression Note    Patient Details  Name: Kimberly Ballard MRN: [id number] Date of Birth: [birth year]  Transition of Care [facility]) CM/SW Contact  Golda Acre, RN Phone Number: 09/26/2020, 2:50 PM  Clinical Narrative:    Clovis Cao sent out to area snf for review;   Expected Discharge Plan: Home/Self Care Barriers to Discharge: Continued Medical Work up  Expected Discharge Plan and Services Expected Discharge Plan: Home/Self Care   Discharge Planning Services: CM Consult   Living arrangements for the past 2 months: Single Family Home                                       Social Determinants of Health (SDOH) Interventions    Readmission Risk Interventions No flowsheet data found.

## 2020-09-26 NOTE — NC FL2 (Signed)
MEDICAID FL2 LEVEL OF CARE SCREENING TOOL     IDENTIFICATION  Patient Name: NAVIKA HOOPES Birthdate: [birth year] Sex: female Admission Date (Current Location): 09/22/2020  [facility] and [place] Number:  Producer, television/film/video and Address:  [facility],  [address], [place] [address]      Provider Number: [id number]  Attending Physician Name and Address:  Merlene Laughter, DO  Relative Name and Phone Number:       Current Level of Care: Hospital Recommended Level of Care: Skilled Nursing Facility Prior Approval Number:    Date Approved/Denied:   PASRR Number: [id number]  Discharge Plan: SNF    Current Diagnoses: Patient Active Problem List   Diagnosis Date Noted   Acute and chronic respiratory failure (acute-on-chronic) (HCC)    Atrial fibrillation (HCC)    Elevated troponin 09/23/2020   CAP (community acquired pneumonia) 09/22/2020   Acute respiratory failure with hypoxia and hypercapnia (HCC) 09/22/2020   Sepsis (HCC) 09/22/2020   Elevated LFTs 09/22/2020   Cardiomegaly 09/22/2020    Orientation RESPIRATION BLADDER Height & Weight     Self, Time, Situation, Place  Normal Continent Weight: 90.9 kg Height:  5\' 3"  (160 cm)  BEHAVIORAL SYMPTOMS/MOOD NEUROLOGICAL BOWEL NUTRITION STATUS      Continent Diet (regular)  AMBULATORY STATUS COMMUNICATION OF NEEDS Skin   Extensive Assist Verbally Normal                       Personal Care Assistance Level of Assistance  Bathing, Feeding, Dressing Bathing Assistance: Limited assistance Feeding assistance: Limited assistance Dressing Assistance: Limited assistance     Functional Limitations Info  Sight, Hearing, Speech Sight Info: Adequate Hearing Info: Adequate Speech Info: Adequate    SPECIAL CARE FACTORS FREQUENCY  PT (By licensed PT), OT (By licensed OT)     PT Frequency: 5 x weekly OT Frequency: 5 x weekly            Contractures      Additional Factors Info   Code Status Code Status Info: full             Current Medications (09/26/2020):  This is the current hospital active medication list Current Facility-Administered Medications  Medication Dose Route Frequency Provider Last Rate Last Admin   apixaban (ELIQUIS) tablet 5 mg  5 mg Oral BID 09/28/2020, MD   5 mg at 09/26/20 1003   arformoterol (BROVANA) nebulizer solution 15 mcg  15 mcg Nebulization BID 09/28/20 B, MD       budesonide (PULMICORT) nebulizer solution 0.5 mg  0.5 mg Nebulization BID Max Fickle B, MD       cefTRIAXone (ROCEPHIN) 2 g in sodium chloride 0.9 % 100 mL IVPB  2 g Intravenous Q24H Max Fickle, NP   Stopped at 09/25/20 1830   Chlorhexidine Gluconate Cloth 2 % PADS 6 each  6 each Topical Daily 09/27/20, MD   6 each at 09/26/20 1030   diltiazem (CARDIZEM CD) 24 hr capsule 120 mg  120 mg Oral Daily Turner, Traci R, MD   120 mg at 09/26/20 1003   furosemide (LASIX) injection 80 mg  80 mg Intravenous Q12H 09/28/20, NP   80 mg at 09/26/20 1002   guaiFENesin (MUCINEX) 12 hr tablet 600 mg  600 mg Oral BID 09/28/20, MD   600 mg at 09/26/20 1003   levalbuterol (XOPENEX) nebulizer solution 1.25 mg  1.25 mg Nebulization  Q4H PRN Lupita Leash, MD       MEDLINE mouth rinse  15 mL Mouth Rinse BID Marguerita Merles [place], DO   15 mL at 09/26/20 1004   methylPREDNISolone sodium succinate (SOLU-MEDROL) 40 mg/mL injection 40 mg  40 mg Intravenous Q12H Simonne Martinet, NP       [START ON 09/27/2020] revefenacin (YUPELRI) nebulizer solution 175 mcg  175 mcg Nebulization Daily Lupita Leash, MD         Discharge Medications: Please see discharge summary for a list of discharge medications.  Relevant Imaging Results:  Relevant Lab Results:   Additional Information ssn:[id number]  Golda Acre, RN

## 2020-09-26 NOTE — Progress Notes (Addendum)
Progress Note  Patient Name: Kimberly Ballard Date of Encounter: 09/26/2020  [facility] Cardiologist: None   Subjective   Remains in atrial fibrillation with CVR in the 80's.  Still on O2 but down to 60% FiO2 on HFNC  Inpatient Medications    Scheduled Meds:  apixaban  5 mg Oral BID   Chlorhexidine Gluconate Cloth  6 each Topical Daily   diltiazem  120 mg Oral Daily   furosemide  80 mg Intravenous Q12H   guaiFENesin  600 mg Oral BID   ipratropium-albuterol  3 mL Nebulization Q6H   mouth rinse  15 mL Mouth Rinse BID   methylPREDNISolone (SOLU-MEDROL) injection  40 mg Intravenous Q12H   Continuous Infusions:  cefTRIAXone (ROCEPHIN)  IV Stopped (09/25/20 1830)   PRN Meds: albuterol   Vital Signs    Vitals:   09/26/20 0600 09/26/20 0741 09/26/20 0800 09/26/20 0851  BP: 124/76  131/80   Pulse: (!) 45  87   Resp: (!) 22  (!) 23   Temp:    97.7 F (36.5 C)  TempSrc:    Oral  SpO2: 92% 91% 92%   Weight:      Height:        Intake/Output Summary (Last 24 hours) at 09/26/2020 0854 Last data filed at 09/26/2020 0402 Gross per 24 hour  Intake 782.74 ml  Output 1850 ml  Net -1067.26 ml    Last 3 Weights 09/22/2020  Weight (lbs) 200 lb 6.4 oz  Weight (kg) 90.901 kg      Telemetry    Atrial fibrillation with CVR- Personally Reviewed  ECG    No new EKG to review - Personally Reviewed  Physical Exam   GEN: ill appearing HEENT: Normal NECK: No JVD; No carotid bruits LYMPHATICS: No lymphadenopathy CARDIAC:irregularly irregular, no murmurs, rubs, gallops RESPIRATORY:  Clear to auscultation without rales, wheezing or rhonchi  ABDOMEN: Soft, non-tender, non-distended MUSCULOSKELETAL:  No edema; No deformity  SKIN: Warm and dry NEUROLOGIC:  Alert and oriented x 3 PSYCHIATRIC:  Normal affect   Labs    High Sensitivity Troponin:   Recent Labs  Lab 09/22/20 2148 09/23/20 0207 09/24/20 0239  TROPONINIHS 128* 100* 52*       Chemistry Recent Labs  Lab  09/24/20 0323 09/25/20 0447 09/25/20 0838 09/26/20 0303  NA 141 142  --  142  K 3.6 4.6  --  4.9  CL 99 104  --  101  CO2 30 33*  --  32  GLUCOSE 187* 178*  --  168*  BUN 58* 43*  --  37*  CREATININE 0.75 0.68  --  0.58  CALCIUM 8.3* 8.4*  --  8.6*  PROT 7.0  --  6.5 6.5  ALBUMIN 2.9*  --  2.7* 2.8*  AST 91*  --  87* 60*  ALT 264*  --  228* 198*  ALKPHOS 112  --  97 91  BILITOT 0.8  --  0.7 0.7  GFRNONAA >60 >60  --  >60  ANIONGAP 12 5  --  9      Hematology Recent Labs  Lab 09/24/20 0323 09/25/20 0447 09/26/20 0303  WBC 16.5* 18.6* 14.2*  RBC 4.90 4.85 5.00  HGB 15.4* 15.4* 15.8*  HCT 49.4* 49.7* 51.3*  MCV 100.8* 102.5* 102.6*  MCH 31.4 31.8 31.6  MCHC 31.2 31.0 30.8  RDW 14.5 14.6 14.2  PLT 318 349 329     BNP Recent Labs  Lab 09/22/20 2148 09/26/20 0303  BNP 889.7* 964.7*      DDimer  Recent Labs  Lab 09/22/20 1441 09/23/20 1844  DDIMER 1.97* 2.04*      CHA2DS2-VASc Score = 3  This indicates a 3.2% annual risk of stroke. The patient's score is based upon: CHF History: No HTN History: No Diabetes History: No Stroke History: No Vascular Disease History: No Age Score: 2 Gender Score: 1    Radiology    CT Angio Chest Pulmonary Embolism (PE) W or WO Contrast  Result Date: 09/24/2020 CLINICAL DATA:  Shortness of breath, body aches, cough, elevated D-dimer EXAM: CT ANGIOGRAPHY CHEST WITH CONTRAST TECHNIQUE: Multidetector CT imaging of the chest was performed using the standard protocol during bolus administration of intravenous contrast. Multiplanar CT image reconstructions and MIPs were obtained to evaluate the vascular anatomy. CONTRAST:  80mL OMNIPAQUE IOHEXOL 350 MG/ML SOLN COMPARISON:  CTA chest 2 days prior FINDINGS: Cardiovascular: Satisfactory opacification of the pulmonary arteries to the segmental level. There is no evidence of acute or chronic pulmonary embolism. The heart is enlarged. Coronary artery calcifications and calcified  atherosclerotic plaque of the aortic arch are again seen. There is no pericardial effusion. The main pulmonary artery is significantly enlarged measuring up to 4.0 cm. There is reflux of contrast into the IVC and hepatic veins, also seen on the prior study. Mediastinum/Nodes: The imaged thyroid is unremarkable. A 1.0 cm precarinal lymph node and 1.0 cm right hilar lymph node are unchanged, nonspecific and possibly reactive. There is no other mediastinal or hilar lymphadenopathy. There is no axillary lymphadenopathy. Lungs/Pleura: The trachea and central airways are patent. Bilateral bronchial wall thickening is similar to the prior study. There is a background of mild-to-moderate centrilobular emphysema. There are consolidative opacities in both lung bases and peripheral opacity in the lingula, slightly worsened compared to the prior study. Some of the opacities again have a nodular appearance (for example in the superior segment of the left lower lobe measuring up to 8 mm, 4-34). There is no pleural effusion or pneumothorax. Calcified pleural plaque along the right base is unchanged, likely related to prior asbestos exposure. Upper Abdomen: The imaged portions of the upper abdominal viscera are unremarkable. Musculoskeletal: Deformity of the right anterior fourth through sixth ribs is likely related to prior trauma. There is multilevel degenerative change of the thoracic spine with mild anterior wedge deformity of the T3 vertebral body, unchanged. There is no acute osseous abnormality. Review of the MIP images confirms the above findings. IMPRESSION: 1. No evidence of acute pulmonary embolism. 2. Patchy consolidative opacities in the lung bases and lingula have slightly worsened compared to the prior study and remain concerning for multifocal pneumonia, with differential including COVID. As before, follow-up chest CT in 3 months is recommended to assess for resolution. 3. Unchanged cardiomegaly with evidence of  right heart failure and pulmonary hypertension as above. 4. Coronary artery calcifications and Aortic Atherosclerosis (ICD10-I70.0). Electronically Signed   By: Lesia Hausen M.D.   On: 09/24/2020 14:44   DG CHEST PORT 1 VIEW  Result Date: 09/26/2020 CLINICAL DATA:  Respiratory failure, history COPD EXAM: PORTABLE CHEST 1 VIEW COMPARISON:  Portable exam 0459 hours compared 09/25/2020 FINDINGS: Enlargement of cardiac silhouette with pulmonary vascular congestion. Atherosclerotic calcification aorta. BILATERAL pulmonary infiltrates greatest at bases. This could represent pulmonary edema or multifocal pneumonia. Probable RIGHT pleural effusion. No pneumothorax. Bones demineralized. IMPRESSION: Enlargement of cardiac silhouette with pulmonary vascular congestion. Persistent BILATERAL pulmonary infiltrates greatest at bases question pulmonary edema versus multifocal infection. Probable RIGHT  pleural effusion. Electronically Signed   By: Ulyses SouthwardMark  Boles M.D.   On: 09/26/2020 08:21   DG CHEST PORT 1 VIEW  Result Date: 09/25/2020 CLINICAL DATA:  Shortness of breath, body aches, decreased appetite, and cough. COVID diagnosis. EXAM: PORTABLE CHEST 1 VIEW COMPARISON:  Chest 09/22/2020.  CT chest 09/24/2020 FINDINGS: Cardiac enlargement. Prominent central hilar structures. Bilateral perihilar and basilar infiltrates could represent pneumonia or edema. Suggestion of developing density in the left costophrenic angle which may represent consolidation. Calcification of the aorta. No pneumothorax. IMPRESSION: Cardiac enlargement with prominent central hilar structures corresponding to vascular prominence on CT. Bilateral perihilar infiltrates. Suggestion of progressing consolidation in the left base. Electronically Signed   By: Burman NievesWilliam  Stevens M.D.   On: 09/25/2020 17:28   VAS US LOWER EXTREMITY VENOUS (DVT)  Result Date: 09/24/2020  Lower Venous DVT Study Patient Name:  Herschell DimesNDREA J Roker  Date of Exam:   09/24/2020 Medical Rec  #: [id number]      Accession #:    [id number] Date of Birth: July 16, 1940     Patient Gender: F Patient Age:   [age] years [facility] Procedure:      VAS US LOWER EXTREMITY VENOUS (DVT) Referring Phys: MURALI RAMASWAMY --------------------------------------------------------------------------------  Indications: Edema.  Risk Factors: COVID 19 positive. Limitations: Poor ultrasound/tissue interface. Comparison Study: No prior studies. Performing Technologist: Chanda BusingGregory Collins RVT  Examination Guidelines: A complete evaluation includes B-mode imaging, spectral Doppler, color Doppler, and power Doppler as needed of all accessible portions of each vessel. Bilateral testing is considered an integral part of a complete examination. Limited examinations for reoccurring indications may be performed as noted. The reflux portion of the exam is performed with the patient in reverse Trendelenburg.  +---------+---------------+---------+-----------+----------+--------------+ RIGHT    CompressibilityPhasicitySpontaneityPropertiesThrombus Aging +---------+---------------+---------+-----------+----------+--------------+ CFV      Full           Yes      Yes                                 +---------+---------------+---------+-----------+----------+--------------+ SFJ      Full                                                        +---------+---------------+---------+-----------+----------+--------------+ FV Prox  Full                                                        +---------+---------------+---------+-----------+----------+--------------+ FV Mid   Full                                                        +---------+---------------+---------+-----------+----------+--------------+ FV DistalFull                                                        +---------+---------------+---------+-----------+----------+--------------+  PFV      Full                                                         +---------+---------------+---------+-----------+----------+--------------+ POP      Full           Yes      Yes                                 +---------+---------------+---------+-----------+----------+--------------+ PTV      Full                                                        +---------+---------------+---------+-----------+----------+--------------+ PERO     Full                                                        +---------+---------------+---------+-----------+----------+--------------+   +---------+---------------+---------+-----------+----------+--------------+ LEFT     CompressibilityPhasicitySpontaneityPropertiesThrombus Aging +---------+---------------+---------+-----------+----------+--------------+ CFV      Full           Yes      Yes                                 +---------+---------------+---------+-----------+----------+--------------+ SFJ      Full                                                        +---------+---------------+---------+-----------+----------+--------------+ FV Prox  Full                                                        +---------+---------------+---------+-----------+----------+--------------+ FV Mid   Full                                                        +---------+---------------+---------+-----------+----------+--------------+ FV DistalFull                                                        +---------+---------------+---------+-----------+----------+--------------+ PFV      Full                                                        +---------+---------------+---------+-----------+----------+--------------+  POP      Full           Yes      Yes                                 +---------+---------------+---------+-----------+----------+--------------+ PTV      Full                                                         +---------+---------------+---------+-----------+----------+--------------+ PERO     Full                                                        +---------+---------------+---------+-----------+----------+--------------+     Summary: RIGHT: - There is no evidence of deep vein thrombosis in the lower extremity.  - No cystic structure found in the popliteal fossa.  LEFT: - There is no evidence of deep vein thrombosis in the lower extremity.  - No cystic structure found in the popliteal fossa.  *See table(s) above for measurements and observations. Electronically signed by Coral Else MD on 09/24/2020 at 8:07:15 PM.    Final     Cardiac Studies   2D echo 09/23/2020 IMPRESSIONS    1. Left ventricular ejection fraction, by estimation, is 55 to 60%. Left  ventricular ejection fraction by 3D volume is 59 %. The left ventricle has  normal function. The left ventricle has no regional wall motion  abnormalities. There is moderate left  ventricular hypertrophy. Left ventricular diastolic parameters are  consistent with Grade I diastolic dysfunction (impaired relaxation).   2. Right ventricular systolic function mildly reduced at base and mid,  with relative preserved apical systolic function. The right ventricular  size is moderately enlarged. There is moderately elevated pulmonary artery  systolic pressure. The estimated  right ventricular systolic pressure is 48.4 mmHg.   3. Left atrial size was mildly dilated.   4. Right atrial size was moderately dilated.   5. The mitral valve is normal in structure. Trivial mitral valve  regurgitation. No evidence of mitral stenosis.   6. Tricuspid valve regurgitation is mild to moderate.   7. The aortic valve is normal in structure. Aortic valve regurgitation is  not visualized. No aortic stenosis is present.   8. The inferior vena cava is dilated in size with <50% respiratory  variability, suggesting right atrial pressure of 15 mmHg.   Patient Profile      80 y.o. female with a hx of COPD with prior tobacco smoking, quit in 2019 who is being seen for evaluation of elevated troponin at the request of Dr. Adela Glimpse.  Assessment & Plan    Elevated Troponin/RV dysfunction/Pulmonary HTN -hsTrop 12>100>52 -EKG on admit with anterior T wave abnormality and unfortunately we do not have old EKG to compare -with acute hypoxemia in setting of PNA and COPD exacerbation this enzyme leak may be related to demand ischemia -Anterior T wave changes are concerning but could be due to acute RV strain from hypoxemia in setting of multifocal PNA -2D echo showed normal LVF with EF 59% but moderately enlarged RV with mildly reduced  RVF at the base and mid and relative sparing of apical RVF which can be seen in acute PE but not specific for acute PE -Chest CT with no evidence of acute PE on 8/22 and repeat Chest CTA 8/24 with no PE -she has COPD with acute exacerbation and multifocal PNA>>suspect that hypoxemia is resulting in acute RV strain with RV dilatation and RV dysfunction -she has moderate pulmonary HTN on echo likely related to underlying COPD with acute exacerbation in setting of hypoxemia -I do not think her Trop elevation is related to ACS and more consistent with demand ischemia in the setting of acute respiratory decompensation -she does have coronary artery calcifications so may benefit from [facility] once respiratory issues resolved as outpt -Findings consistent with pulmonary HTN and 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 and worsened in the setting of acute COPD exacerbation and PNA -need to treat underlying respiratory illness and may benefit from right heart cath prior to discharge -BNP is elevated but may be related to underlying respiratory issues -Given Lasix 40mg  IV yesterday and put out 2.2L yesterday and is net + 155cc -no weights recorded -SCr stable at 0.58 and K+ 4.2 -BNP elevated at 964 although can be elevated in acute  respiratory failure but suspect that she has some diastolic CHF in setting of afib with RVR -continue Lasix 80mg  IV BID -follow I&O's, daily weights and renal function while diuresing -repeat 2D echo in 6 weeks to see if RV size and function improve after treatment of COPD exacerbation and getting back into sinus rhythm and if RVF has not improved then may benefit from RHC   Atrial fibrillation with RVR -remains in afib but now with CVR  -continue Cardizem CD 120mg  daily and Eliquis 5mg  BID -would like to avoid Amio in setting of acute respiratory decompensation and chronic COPD -will plan DCCV in 3-4 weeks after starting Eliquis -followup in afib clinic in 2 weeks to arrange DCCV --CHA2DS2-VASc Score = 3  This indicates a 3.2% annual risk of stroke. The patient's score is based upon: CHF History: No HTN History: No Diabetes History: No Stroke History: No Vascular Disease History: No Age Score: 2 Gender Score: 1  [facility] will sign off.   Medication Recommendations:  Apixaban 5mg  BID, Cardizem CD 120mg  daily, Lasix dose to be determined at discharge by TRH/CCM Other recommendations (labs, testing, etc): 2D echo in 6 weeks Follow up as an outpatient:  afib clinic in 2 weeks and Dr. or APP in 6 weeks   I have spent a total of 30 minutes with patient reviewing 2D echo , telemetry, EKGs, labs and examining patient as well as establishing an assessment and plan that was discussed with the patient.  > 50% of time was spent in direct patient care.       For questions or updates, please contact [facility] Please consult [email or web address] for contact info under        Signed, , MD  09/26/2020, 8:54 AM

## 2020-09-26 NOTE — Plan of Care (Signed)

## 2020-09-26 NOTE — Plan of Care (Signed)
Discussed with patient plan of care for the evening, pain management, mouth care and wearing bipap at bedtime with some teach back displayed.  Problem: Nutrition: Goal: Adequate nutrition will be maintained Outcome: Progressing   Problem: Pain Managment: Goal: General experience of comfort will improve Outcome: Progressing

## 2020-09-26 NOTE — Progress Notes (Addendum)
Per Dr. Norris Cross request, have arranged afib clinic f/u 2 weeks, limited 2D echo 6 weeks with appt thereafter. Appt info placed on AVS.

## 2020-09-27 ENCOUNTER — Inpatient Hospital Stay (HOSPITAL_COMMUNITY): Payer: Medicare HMO

## 2020-09-27 DIAGNOSIS — R0600 Dyspnea, unspecified: Secondary | ICD-10-CM

## 2020-09-27 DIAGNOSIS — J189 Pneumonia, unspecified organism: Secondary | ICD-10-CM | POA: Diagnosis not present

## 2020-09-27 DIAGNOSIS — I517 Cardiomegaly: Secondary | ICD-10-CM | POA: Diagnosis not present

## 2020-09-27 DIAGNOSIS — J9601 Acute respiratory failure with hypoxia: Secondary | ICD-10-CM | POA: Diagnosis not present

## 2020-09-27 DIAGNOSIS — R7989 Other specified abnormal findings of blood chemistry: Secondary | ICD-10-CM | POA: Diagnosis not present

## 2020-09-27 DIAGNOSIS — J9621 Acute and chronic respiratory failure with hypoxia: Secondary | ICD-10-CM | POA: Diagnosis not present

## 2020-09-27 LAB — COMPREHENSIVE METABOLIC PANEL
ALT: 149 U/L — ABNORMAL HIGH (ref 0–44)
ALT: 146 U/L — ABNORMAL HIGH (ref 0–44)
AST: 35 U/L (ref 15–41)
AST: 42 U/L — ABNORMAL HIGH (ref 15–41)
Albumin: 2.6 g/dL — ABNORMAL LOW (ref 3.5–5.0)
Albumin: 3 g/dL — ABNORMAL LOW (ref 3.5–5.0)
Alkaline Phosphatase: 75 U/L (ref 38–126)
Alkaline Phosphatase: 89 U/L (ref 38–126)
Anion gap: 8 (ref 5–15)
Anion gap: 17 — ABNORMAL HIGH (ref 5–15)
BUN: 38 mg/dL — ABNORMAL HIGH (ref 8–23)
BUN: 45 mg/dL — ABNORMAL HIGH (ref 8–23)
CO2: 41 mmol/L — ABNORMAL HIGH (ref 22–32)
CO2: 39 mmol/L — ABNORMAL HIGH (ref 22–32)
Calcium: 8.8 mg/dL — ABNORMAL LOW (ref 8.9–10.3)
Calcium: 8.8 mg/dL — ABNORMAL LOW (ref 8.9–10.3)
Chloride: 96 mmol/L — ABNORMAL LOW (ref 98–111)
Chloride: 84 mmol/L — ABNORMAL LOW (ref 98–111)
Creatinine, Ser: 0.57 mg/dL (ref 0.44–1.00)
Creatinine, Ser: 0.82 mg/dL (ref 0.44–1.00)
GFR, Estimated: >60 mL/min (ref >60)
GFR, Estimated: >60 mL/min (ref >60)
Glucose, Bld: 175 mg/dL — ABNORMAL HIGH (ref 70–99)
Glucose, Bld: 198 mg/dL — ABNORMAL HIGH (ref 70–99)
Potassium: 4.2 mmol/L (ref 3.5–5.1)
Potassium: 3.8 mmol/L (ref 3.5–5.1)
Sodium: 145 mmol/L (ref 135–145)
Sodium: 140 mmol/L (ref 135–145)
Total Bilirubin: 0.7 mg/dL (ref 0.3–1.2)
Total Bilirubin: 1.1 mg/dL (ref 0.3–1.2)
Total Protein: 6.4 g/dL — ABNORMAL LOW (ref 6.5–8.1)
Total Protein: 7.2 g/dL (ref 6.5–8.1)

## 2020-09-27 LAB — CBC WITH DIFFERENTIAL/PLATELET
Abs Immature Granulocytes: 0.35 10*3/uL — ABNORMAL HIGH (ref 0.00–0.07)
Basophils Absolute: 0.1 10*3/uL (ref 0.0–0.1)
Basophils Relative: 1 %
Eosinophils Absolute: 0 10*3/uL (ref 0.0–0.5)
Eosinophils Relative: 0 %
HCT: 50.2 % — ABNORMAL HIGH (ref 36.0–46.0)
Hemoglobin: 16.3 g/dL — ABNORMAL HIGH (ref 12.0–15.0)
Immature Granulocytes: 2 %
Lymphocytes Relative: 4 %
Lymphs Abs: 0.7 10*3/uL (ref 0.7–4.0)
MCH: 31.9 pg (ref 26.0–34.0)
MCHC: 32.5 g/dL (ref 30.0–36.0)
MCV: 98.2 fL (ref 80.0–100.0)
Monocytes Absolute: 0.6 10*3/uL (ref 0.1–1.0)
Monocytes Relative: 4 %
Neutro Abs: 13.4 10*3/uL — ABNORMAL HIGH (ref 1.7–7.7)
Neutrophils Relative %: 89 %
Platelets: 325 10*3/uL (ref 150–400)
RBC: 5.11 MIL/uL (ref 3.87–5.11)
RDW: 13.9 % (ref 11.5–15.5)
WBC: 15.1 10*3/uL — ABNORMAL HIGH (ref 4.0–10.5)
nRBC: 0 % (ref 0.0–0.2)

## 2020-09-27 LAB — MAGNESIUM: Magnesium: 2.4 mg/dL (ref 1.7–2.4)

## 2020-09-27 LAB — CULTURE, BLOOD (ROUTINE X 2)
Culture: NO GROWTH
Culture: NO GROWTH
Special Requests: ADEQUATE

## 2020-09-27 LAB — GLUCOSE, CAPILLARY: Glucose-Capillary: 183 mg/dL — ABNORMAL HIGH (ref 70–99)

## 2020-09-27 LAB — PHOSPHORUS: Phosphorus: 4.3 mg/dL (ref 2.5–4.6)

## 2020-09-27 MED ORDER — SENNOSIDES-DOCUSATE SODIUM 8.6-50 MG PO TABS
1.0000 | ORAL_TABLET | Freq: Every evening | ORAL | Status: DC | PRN
  Administered 2020-09-27: 1 via ORAL
  Filled 2020-09-27: qty 1

## 2020-09-27 MED ORDER — METOPROLOL TARTRATE 5 MG/5ML IV SOLN
5.0000 mg | Freq: Four times a day (QID) | INTRAVENOUS | Status: AC
  Administered 2020-09-27 – 2020-09-28 (×2): 5 mg via INTRAVENOUS
  Filled 2020-09-27 (×2): qty 5

## 2020-09-27 NOTE — Plan of Care (Signed)
Discussed with patient plan of care for the evening, pain management, bowel movements and heart rate with some teach back displayed.  12 Lead EKG performed: A-fib c RVR and notified on-call MD while requesting something for their stools.  Problem: Pain Managment: Goal: General experience of comfort will improve Outcome: Progressing

## 2020-09-27 NOTE — Progress Notes (Signed)
PROGRESS NOTE    Kimberly WIGGLESWORTH  POE:[id number] DOB: 18-Dec-1940 DOA: 09/22/2020 PCP: System, Provider Not In  Brief Narrative:  The patient is 80 year old female with history of COPD presented with shortness of breath, body aches, decreased appetite and coughing.  Patients daughter was diagnosed with COVID last week.  She was started on Paxilovid empirically due to risk of exposure.  Patient continued to get worse and developed shortness of breath.  On EMS arrival she required 6 L/min of oxygen.  In the ED SARS-CoV-2 RT-PCR was negative.  Chest x-ray showed bilateral infiltrates, CTA was negative for PE but showed evidence of bilateral infiltrates.  Patient started on Rocephin and Zithromax for community-acquired pneumonia.  Subsequently she decompensated and worsened and went into A. fib with RVR and had to be placed on BiPAP and she is subsequently moved to the stepdown unit.  We are empirically treating for COVID and pulmonary has been consulted as well as cardiology.  Cardiology started the patient on Cardizem drip and pulmonary recommending continuing steroids.  She is weaned to 15 L nonrebreather and remains in A. fib without RVR so her Cardizem is being changed to 180 mg p.o. daily and changing heparin to Eliquis and discontinue aspirin.  Cardiology recommends continue Lasix but now the dose is changed to IV Lasix twice daily 80 mg.  She was given IV Lasix 40 mg yesterday.  Cardiology feels that she has some diastolic CHF in the setting of A. fib with RVR and they are planning on repeating echo in 6 weeks.  Cardiology recommends continuing Cardizem 120 mg daily and Eliquis 5 mg twice daily.    Respiratory status is slowly improving and after goals of care discussion with pulmonary she was made DNR.  Assessment & Plan:   Active Problems:   CAP (community acquired pneumonia)   Acute respiratory failure with hypoxia and hypercapnia (HCC)   Sepsis ([place])   Elevated LFTs   Cardiomegaly   Elevated  troponin   Atrial fibrillation (HCC)   Acute and chronic respiratory failure (acute-on-chronic) (HCC)  Acute respiratory failure with hypoxia requiring noninvasive positive pressure ventilation with BiPAP 12/6 60% FiO2 -Multifactorial from underlying multifocal pneumonia, diastolic CHF -Patient is currently on Rocephin and Zithromax and will continue -Follow blood culture results -She is given IV Lasix 40 mg daily for last 3 days but now she will be getting IV 80 mg twice daily per pulmonary recommendations -CTA of the Chest PE protocol done and showed "No evidence of acute pulmonary embolism. Patchy consolidative opacities in the lung bases and  lingula have slightly worsened compared to the prior study and remain concerning for multifocal pneumonia, with differential including COVID. As before, follow-up chest CT in 3 months is recommended to assess for resolution. Unchanged cardiomegaly with evidence of right heart failure and pulmonary hypertension as above. Coronary artery calcifications and Aortic Atherosclerosis (ICD10-I70.0). -SpO2: 90 % O2 Flow Rate (L/min): 30 L/min FiO2 (%): 100 %; was on 15 L nonrebreather and now oxygen requirement is improving -Inflammatory Markers Recent Labs    09/25/20 0838  CRP 12.3*   -ABG    Component Value Date/Time   PHART 7.325 (L) 09/25/2020 0320   PCO2ART 67.6 (HH) 09/25/2020 0320   PO2ART 130 (H) 09/25/2020 0320   HCO3 34.2 (H) 09/25/2020 0320   O2SAT 98.6 09/25/2020 0320      Lab Results  Component Value Date   SARSCOV2NAA NEGATIVE 09/24/2020   [place] NEGATIVE 09/22/2020   -Leukocytosis is resolving and  went from 18.6 is now 14.2 yesterday and is now 15.1 -Speech therapy consulted for swallow evaluation -Appreciate PCCM consult and they placed the patient on BiPAP but has now been weaned to 15 L; pulmonary feels that this is clinically COVID given that hypoxemia and hypercapnia however now her isolation precautions have been  discontinued -Pulmonary recommends continuing BiPAP nightly and 2 hours twice a day -They are recommending nebs -Steroids are being completed however pulmonary recommends continue steroids for now and she will be continued on Solu-Medrol 40 mg every 12 -Continuing Xopenex 1.25 mg nebs every 4 hours for wheezing shortness of breath as well as revefenacin 175 mcg nebulized daily and budesonide 0.5 mg neb twice daily -She remains on IV ceftriaxone and azithromycin has now been discontinued. -Continue with IV Lasix per pulmonary recommendations with IV Lasix 80 mg twice daily; she is -5.309 L -Clear liquid diet was initiated yesterday but now is advance to oral diet -Pulmonary recommends continue diuresis -CXR today showed "Stable cardiomegaly with central pulmonary vascular congestion and bibasilar pulmonary edema and associated effusions. Aortic Atherosclerosis."  -Pulmonary also recommends maintaining ICU today mobilizing getting up and out of bed; pulmonary is also checking serology for looking for systemic problems that can lead to inflammatory lung disease  A. fib with RVR; now RVR is improved -Heart rates were ranging in the mid 100s and now in the 90s -She was placed on a Cardizem drip and now cardiology had changed to 180 mg p.o. Cardizem -Currently on anticoagulation with IV heparin given her CHA2DS2-VASc score of 3 but will now be changed to p.o. Eliquis by cardiology -Continue to monitor on telemetry and cardiology recommends decreasing the Cardizem to 120 mg daily and continuing p.o. Eliquis  AKI -Resolving; BUN/Cr is now 38/0.57 -Patient's BUNs/creatinine was elevated to 69/1.28 -Avoid nephrotoxic medications, contrast dyes, hypotension and renally dose medications -Continue monitor and trend renal function and repeat CMP in a.m.   Acute Diastolic CHF/right ventricular function/pulmonary hypertension -Echocardiogram is currently pending -She has bilateral crackles on  auscultation -Cardiology recommends continuing diuresis with IV Lasix and now Pulmonary is diuresing with IV Lasix 80 mg BID -BNP was elevated but cardiology feels that this may be underlying related to respiratory issues -Strict I's and O's and daily weights and she -5.3 L as above -Given her history of COPD with acute exacerbation multifocal pneumonia, cardiology feels that the hypoxemia is resulting in acute right ventricular strain with right ventricular dilatation and right ventricular dysfunction -Patient has moderate pulm hypertension on echo related to underlying COPD with acute exacerbation in the setting of hypoxemia -Repeat CTA PE as above -BNP was elevated at 889.7 and worsened and BNP was elevated today to 964.7 -Cardiology has been consulted and appreciate further care per them   Elevated Troponin -Likely demand ischemia -hSTrop 12 -> 100 and then trended down to 52 -EKG shows T wave abnormalities in anterior chest leads -Cardiology has been consulted and have now signed off the case   Abnormal LFTs/Transaminitis -Unclear etiology -?  Passive liver congestion from CHF -Total bilirubin is normal, alk phos is normal -LFTs on admission showed an AST of 503 and ALT of 439; now improving as AST is now 35 and ALT is 149   Right middle lobe nodule -Will need repeat CT in 3 months after treatment of pneumonia   Obesity -Complicates overall prognosis and care -Estimated body mass index is 35.5 kg/m as calculated from the following:   Height as of this encounter: 5'  3" (1.6 m).   Weight as of this encounter: 90.9 kg. -Weight Loss and Dietary Counseling   DVT prophylaxis: Anticoagulated with heparin drip Code Status: FULL CODE  Family Communication: No family currently at bedside Disposition Plan: Continue to monitor in the stepdown unit  Status is: Inpatient  Remains inpatient appropriate because:Unsafe d/c plan, IV treatments appropriate due to intensity of illness or  inability to take PO, and Inpatient level of care appropriate due to severity of illness  Dispo: The patient is from: Home              Anticipated d/c is to:  TBD              Patient currently is not medically stable to d/c.   Difficult to place patient No   Consultants:  Pulmonary/PCCM Cardiology  Procedures:  ECHOCARDIOGRAM IMPRESSIONS     1. Left ventricular ejection fraction, by estimation, is 55 to 60%. Left  ventricular ejection fraction by 3D volume is 59 %. The left ventricle has  normal function. The left ventricle has no regional wall motion  abnormalities. There is moderate left  ventricular hypertrophy. Left ventricular diastolic parameters are  consistent with Grade I diastolic dysfunction (impaired relaxation).   2. Right ventricular systolic function mildly reduced at base and mid,  with relative preserved apical systolic function. The right ventricular  size is moderately enlarged. There is moderately elevated pulmonary artery  systolic pressure. The estimated  right ventricular systolic pressure is 82.5 mmHg.   3. Left atrial size was mildly dilated.   4. Right atrial size was moderately dilated.   5. The mitral valve is normal in structure. Trivial mitral valve  regurgitation. No evidence of mitral stenosis.   6. Tricuspid valve regurgitation is mild to moderate.   7. The aortic valve is normal in structure. Aortic valve regurgitation is  not visualized. No aortic stenosis is present.   8. The inferior vena cava is dilated in size with <50% respiratory  variability, suggesting right atrial pressure of 15 mmHg.   FINDINGS   Left Ventricle: Left ventricular ejection fraction, by estimation, is 55  to 60%. Left ventricular ejection fraction by 3D volume is 59 %. The left  ventricle has normal function. The left ventricle has no regional wall  motion abnormalities. The left  ventricular internal cavity size was normal in size. There is moderate  left  ventricular hypertrophy. Left ventricular diastolic parameters are  consistent with Grade I diastolic dysfunction (impaired relaxation).   Right Ventricle: Calcified moderate band. The right ventricular size is  moderately enlarged. No increase in right ventricular wall thickness.  Right ventricular systolic function mildly reduced at base and mid, with  relative preserved apical systolic  function. There is moderately elevated pulmonary artery systolic pressure.  The tricuspid regurgitant velocity is 2.89 m/s, and with an assumed right  atrial pressure of 15 mmHg, the estimated right ventricular systolic  pressure is 00.3 mmHg.   Left Atrium: Left atrial size was mildly dilated.   Right Atrium: Right atrial size was moderately dilated.   Pericardium: There is no evidence of pericardial effusion. Presence of  pericardial fat pad.   Mitral Valve: The mitral valve is normal in structure. Mild mitral annular  calcification. Trivial mitral valve regurgitation. No evidence of mitral  valve stenosis.   Tricuspid Valve: The tricuspid valve is normal in structure. Tricuspid  valve regurgitation is mild to moderate. No evidence of tricuspid  stenosis.   Aortic Valve: The  aortic valve is normal in structure. Aortic valve  regurgitation is not visualized. No aortic stenosis is present.   Pulmonic Valve: The pulmonic valve was normal in structure. Pulmonic valve  regurgitation is trivial. No evidence of pulmonic stenosis.   Aorta: Aorta dimension upper limit of normal for age and BSA. The aortic  root is normal in size and structure.   Venous: The inferior vena cava is dilated in size with less than 50%  respiratory variability, suggesting right atrial pressure of 15 mmHg.   IAS/Shunts: The interatrial septum appears to be lipomatous. No atrial  level shunt detected by color flow Doppler.      LEFT VENTRICLE  PLAX 2D  LVIDd:         3.60 cm         Diastology  LVIDs:         2.50 cm          LV e' medial:    6.31 cm/s  LV PW:         1.30 cm         LV E/e' medial:  9.0  LV IVS:        1.30 cm         LV e' lateral:   10.20 cm/s  LVOT diam:     2.00 cm         LV E/e' lateral: 5.6  LV SV:         54  LV SV Index:   28  LVOT Area:     3.14 cm        3D Volume EF                                 LV 3D EF:    Left                                              ventricul  LV Volumes (MOD)                            ar  LV vol d, MOD    73.9 ml                    ejection  A2C:                                        fraction  LV vol d, MOD    53.3 ml                    by 3D  A4C:                                        volume is  LV vol s, MOD    28.2 ml                    59 %.  A2C:  LV vol s, MOD    20.1 ml  A4C:  3D Volume EF:  LV SV MOD A2C:   45.7 ml       3D EF:        59 %  LV SV MOD A4C:   53.3 ml  LV SV MOD BP:    38.1 ml   RIGHT VENTRICLE             IVC  RV S prime:     10.60 cm/s  IVC diam: 2.90 cm  TAPSE (M-mode): 1.8 cm   LEFT ATRIUM             Index       RIGHT ATRIUM           Index  LA diam:        3.90 cm 2.02 cm/m  RA Area:     25.80 cm  LA Vol (A2C):   33.2 ml 17.16 ml/m RA Volume:   96.90 ml  50.07 ml/m  LA Vol (A4C):   54.8 ml 28.32 ml/m  LA Biplane Vol: 43.4 ml 22.43 ml/m   AORTIC VALVE             PULMONIC VALVE  LVOT Vmax:   95.70 cm/s  PR End Diast Vel: 1.35 msec  LVOT Vmean:  61.300 cm/s  LVOT VTI:    0.172 m     AORTA  Ao Root diam: 3.70 cm  Ao Asc diam:  3.80 cm   MITRAL VALVE               TRICUSPID VALVE  MV Area (PHT): 2.95 cm    TV Peak grad:   34.6 mmHg  MV Decel Time: 257 msec    TV Vmax:        2.94 m/s  MV E velocity: 56.80 cm/s  TR Peak grad:   33.4 mmHg  MV A velocity: 82.10 cm/s  TR Vmax:        289.00 cm/s  MV E/A ratio:  0.69                             SHUNTS                             Systemic VTI:  0.17 m                             Systemic Diam: 2.00 cm   Antimicrobials:   Anti-infectives (From admission, onward)    Start     Dose/Rate Route Frequency Ordered Stop   09/23/20 1800  cefTRIAXone (ROCEPHIN) 2 g in sodium chloride 0.9 % 100 mL IVPB        2 g 200 mL/hr over 30 Minutes Intravenous Every 24 hours 09/22/20 1842 09/30/20 1759   09/23/20 1800  azithromycin (ZITHROMAX) 500 mg in sodium chloride 0.9 % 250 mL IVPB  Status:  Discontinued        500 mg 250 mL/hr over 60 Minutes Intravenous Every 24 hours 09/22/20 1842 09/25/20 1456   09/22/20 1630  cefTRIAXone (ROCEPHIN) 1 g in sodium chloride 0.9 % 100 mL IVPB        1 g 200 mL/hr over 30 Minutes Intravenous  Once 09/22/20 1618 09/22/20 1806   09/22/20 1630  azithromycin (ZITHROMAX) 500 mg in sodium chloride 0.9 % 250 mL IVPB        500  mg 250 mL/hr over 60 Minutes Intravenous  Once 09/22/20 1618 09/22/20 1939        Subjective: Seen and examined and she appeared fatigued.  Respiratory status is improving very slowly.  Wanting to get out of bed.  States that she had a bowel movement.  No other concerns or complaints this time.  Objective: Vitals:   09/27/20 1100 09/27/20 1200 09/27/20 1224 09/27/20 1519  BP: 133/74 132/84    Pulse: (!) 48 95  89  Resp: (!) 21 18  (!) 21  Temp:   98.4 F (36.9 C)   TempSrc:   Axillary   SpO2: 91% (!) 88%  90%  Weight:      Height:        Intake/Output Summary (Last 24 hours) at 09/27/2020 1521 Last data filed at 09/27/2020 1200 Gross per 24 hour  Intake 584.79 ml  Output 5050 ml  Net -4465.21 ml    Filed Weights   09/22/20 2020  Weight: 90.9 kg   Examination: Physical Exam:  Constitutional: WN/WD obese Caucasian female currently in mild respiratory distress no longer wearing BiPAP and respiratory status is slowly improving Eyes: Lids and conjunctivae normal, sclerae anicteric  ENMT: External Ears, Nose appear normal.  Hard of hearing Neck: Appears normal, supple, no cervical masses, normal ROM, no appreciable thyromegaly Respiratory: Diminished  to auscultation bilaterally with coarse breath sounds no crackles, wearing supplemental oxygen via nasal cannula and is mildly tachypneic Cardiovascular: Irregularly irregular, no murmurs / rubs / gallops. S1 and S2 auscultated.  Is 1+ lower extremity edema Abdomen: Soft, non-tender, non-distended. Bowel sounds positive.  GU: Deferred. Musculoskeletal: No clubbing / cyanosis of digits/nails. No joint deformity upper and lower extremities. Skin: No rashes, lesions, ulcers on limited skin evaluation. No induration; Warm and dry.  Neurologic: CN 2-12 grossly intact with no focal deficits. Romberg sign and cerebellar reflexes not assessed.  Psychiatric: Normal judgment and insight. Alert and oriented x 3.  A little anxious mood and appropriate affect.    Data Reviewed: I have personally reviewed following labs and imaging studies  CBC: Recent Labs  Lab 09/22/20 1441 09/23/20 0207 09/24/20 0323 09/25/20 0447 09/26/20 0303 09/27/20 0311  WBC 21.6* 19.7* 16.5* 18.6* 14.2* 15.1*  NEUTROABS 16.7* 16.9*  --   --   --  13.4*  HGB 15.6* 15.7* 15.4* 15.4* 15.8* 16.3*  HCT 47.4* 49.4* 49.4* 49.7* 51.3* 50.2*  MCV 98.3 100.6* 100.8* 102.5* 102.6* 98.2  PLT 283 253 318 349 329 482    Basic Metabolic Panel: Recent Labs  Lab 09/22/20 1500 09/23/20 0207 09/24/20 0323 09/25/20 0447 09/26/20 0303 09/26/20 0903 09/26/20 1724 09/27/20 0311  NA  --  139 141 142 142 140 140 145  K  --  3.9 3.6 4.6 4.9 4.8 4.4 4.2  CL  --  102 99 104 101 100 95* 96*  CO2  --  25 30 33* 32 34* 34* 41*  GLUCOSE  --  141* 187* 178* 168* 185* 167* 175*  BUN  --  69* 58* 43* 37* 36* 36* 38*  CREATININE  --  1.04* 0.75 0.68 0.58 0.56 0.65 0.57  CALCIUM  --  8.3* 8.3* 8.4* 8.6* 8.6* 8.9 8.8*  MG 2.6* 2.7* 2.8* 2.7* 2.8*  --   --  2.4  PHOS 3.8 3.5  --  3.2 3.6  --   --  4.3    GFR: Estimated Creatinine Clearance: 61 mL/min (by C-G formula based on SCr of 0.57 mg/dL). Liver  Function Tests: Recent Labs  Lab  09/25/20 0838 09/26/20 0303 09/26/20 0903 09/26/20 1724 09/27/20 0311  AST 87* 60* 50* 47* 35  ALT 228* 198* 191* 183* 149*  ALKPHOS 97 91 90 92 75  BILITOT 0.7 0.7 0.9 0.9 0.7  PROT 6.5 6.5 6.7 7.0 6.4*  ALBUMIN 2.7* 2.8* 2.9* 2.9* 2.6*    No results for input(s): LIPASE, AMYLASE in the last 168 hours. No results for input(s): AMMONIA in the last 168 hours. Coagulation Profile: Recent Labs  Lab 09/22/20 2050  INR 1.3*    Cardiac Enzymes: Recent Labs  Lab 09/22/20 2148  CKTOTAL 60    BNP (last 3 results) No results for input(s): PROBNP in the last 8760 hours. HbA1C: No results for input(s): HGBA1C in the last 72 hours. CBG: Recent Labs  Lab 09/27/20 1158  GLUCAP 183*   Lipid Profile: No results for input(s): CHOL, HDL, LDLCALC, TRIG, CHOLHDL, LDLDIRECT in the last 72 hours.  Thyroid Function Tests: No results for input(s): TSH, T4TOTAL, FREET4, T3FREE, THYROIDAB in the last 72 hours.  Anemia Panel: No results for input(s): VITAMINB12, FOLATE, FERRITIN, TIBC, IRON, RETICCTPCT in the last 72 hours.  Sepsis Labs: Recent Labs  Lab 09/22/20 1441 09/23/20 0207 09/24/20 0323  PROCALCITON 0.48 0.49 0.23  LATICACIDVEN 1.7  --   --      Recent Results (from the past 240 hour(s))  Resp Panel by RT-PCR (Flu A&B, Covid) Nasopharyngeal Swab     Status: None   Collection Time: 09/22/20  2:41 PM   Specimen: Nasopharyngeal Swab; Nasopharyngeal(NP) swabs in vial transport medium  Result Value Ref Range Status   SARS Coronavirus 2 by RT PCR NEGATIVE NEGATIVE Final    Comment: (NOTE) SARS-CoV-2 target nucleic acids are NOT DETECTED.  The SARS-CoV-2 RNA is generally detectable in upper respiratory specimens during the acute phase of infection. The lowest concentration of SARS-CoV-2 viral copies this assay can detect is 138 copies/mL. A negative result does not preclude SARS-Cov-2 infection and should not be used as the sole basis for treatment or other patient  management decisions. A negative result may occur with  improper specimen collection/handling, submission of specimen other than nasopharyngeal swab, presence of viral mutation(s) within the areas targeted by this assay, and inadequate number of viral copies(<138 copies/mL). A negative result must be combined with clinical observations, patient history, and epidemiological information. The expected result is Negative.  Fact Sheet for Patients:  EntrepreneurPulse.com.au  Fact Sheet for Healthcare Providers:  IncredibleEmployment.be  This test is no t yet approved or cleared by the [place] FDA and  has been authorized for detection and/or diagnosis of SARS-CoV-2 by FDA under an Emergency Use Authorization (EUA). This EUA will remain  in effect (meaning this test can be used) for the duration of the COVID-19 declaration under Section 564(b)(1) of the Act, 21 U.S.C.section 360bbb-3(b)(1), unless the authorization is terminated  or revoked sooner.       Influenza A by PCR NEGATIVE NEGATIVE Final   Influenza B by PCR NEGATIVE NEGATIVE Final    Comment: (NOTE) The Xpert Xpress SARS-CoV-2/FLU/RSV plus assay is intended as an aid in the diagnosis of influenza from Nasopharyngeal swab specimens and should not be used as a sole basis for treatment. Nasal washings and aspirates are unacceptable for Xpert Xpress SARS-CoV-2/FLU/RSV testing.  Fact Sheet for Patients: EntrepreneurPulse.com.au  Fact Sheet for Healthcare Providers: IncredibleEmployment.be  This test is not yet approved or cleared by the [place] and has been authorized  for detection and/or diagnosis of SARS-CoV-2 by FDA under an Emergency Use Authorization (EUA). This EUA will remain in effect (meaning this test can be used) for the duration of the COVID-19 declaration under Section 564(b)(1) of the Act, 21 U.S.C. section 360bbb-3(b)(1),  unless the authorization is terminated or revoked.  Performed at [facility], [place] [address], [place], [place] [address]   Blood Culture (routine x 2)     Status: None   Collection Time: 09/22/20  2:41 PM   Specimen: BLOOD  Result Value Ref Range Status   Specimen Description   Final    BLOOD BLOOD LEFT FOREARM Performed at [facility] [address], [place], [place] [address]    Special Requests   Final    BOTTLES DRAWN AEROBIC AND ANAEROBIC Blood Culture adequate volume Performed at [facility] [address] [place], [place] [address]    Culture   Final    NO GROWTH 5 DAYS Performed at [facility], [place] [address], [place], [place] [address]    Report Status 09/27/2020 FINAL  Final  Blood Culture (routine x 2)     Status: None   Collection Time: 09/22/20  5:20 PM   Specimen: BLOOD LEFT HAND  Result Value Ref Range Status   Specimen Description   Final    BLOOD LEFT HAND Performed at [facility], [place] [address], [place], [place] [address]    Special Requests   Final    BOTTLES DRAWN AEROBIC AND ANAEROBIC Blood Culture results may not be optimal due to an excessive volume of blood received in culture bottles Performed at [facility] [address] [place], [place] [address]    Culture   Final    NO GROWTH 5 DAYS Performed at [facility], [place] [address], [place], [place] [address]    Report Status 09/27/2020 FINAL  Final  Respiratory (~20 pathogens) panel by PCR     Status: None   Collection Time: 09/22/20 10:47 PM   Specimen: Nasopharyngeal Swab; Respiratory  Result Value Ref Range Status   Adenovirus NOT DETECTED NOT DETECTED Final   Coronavirus 229E NOT DETECTED NOT DETECTED Final    Comment: (NOTE) The Coronavirus on the Respiratory Panel, DOES NOT test for the novel  Coronavirus (2019 nCoV)    Coronavirus HKU1 NOT DETECTED NOT DETECTED Final   Coronavirus  NL63 NOT DETECTED NOT DETECTED Final   Coronavirus OC43 NOT DETECTED NOT DETECTED Final   Metapneumovirus NOT DETECTED NOT DETECTED Final   Rhinovirus / Enterovirus NOT DETECTED NOT DETECTED Final   Influenza A NOT DETECTED NOT DETECTED Final   Influenza B NOT DETECTED NOT DETECTED Final   Parainfluenza Virus 1 NOT DETECTED NOT DETECTED Final   Parainfluenza Virus 2 NOT DETECTED NOT DETECTED Final   Parainfluenza Virus 3 NOT DETECTED NOT DETECTED Final   Parainfluenza Virus 4 NOT DETECTED NOT DETECTED Final   Respiratory Syncytial Virus NOT DETECTED NOT DETECTED Final   Bordetella pertussis NOT DETECTED NOT DETECTED Final   Bordetella Parapertussis NOT DETECTED NOT DETECTED Final   Chlamydophila pneumoniae NOT DETECTED NOT DETECTED Final   Mycoplasma pneumoniae NOT DETECTED NOT DETECTED Final    Comment: Performed at [facility], [place]. [address], [place], [place] [address]  MRSA Next Gen by PCR, Nasal     Status: None   Collection Time: 09/24/20  5:24 AM   Specimen: Nasal Mucosa; Nasal Swab  Result Value Ref Range Status   MRSA by PCR Next Gen NOT DETECTED NOT DETECTED Final    Comment: (NOTE) The GeneXpert MRSA Assay (FDA approved for NASAL specimens only), is one component of a comprehensive MRSA colonization surveillance program. It is not intended to diagnose MRSA infection nor to guide or monitor treatment for MRSA infections. Test performance is not FDA approved in patients less than 50 years old. Performed at [facility], [place] [address] [place], [place] [address]   Resp Panel by RT-PCR (Flu A&B, Covid) Nasopharyngeal Swab     Status: None   Collection Time: 09/24/20  2:21 PM   Specimen: Nasopharyngeal Swab; Nasopharyngeal(NP) swabs in vial transport medium  Result Value Ref Range Status   SARS Coronavirus 2 by RT PCR NEGATIVE NEGATIVE Final    Comment: (NOTE) SARS-CoV-2 target nucleic acids are NOT DETECTED.  The SARS-CoV-2 RNA is generally  detectable in upper respiratory specimens during the acute phase of infection. The lowest concentration of SARS-CoV-2 viral copies this assay can detect is 138 copies/mL. A negative result does not preclude SARS-Cov-2 infection and should not be used as the sole basis for treatment or other patient management decisions. A negative result may occur with  improper specimen collection/handling, submission of specimen other than nasopharyngeal swab, presence of viral mutation(s) within the areas targeted by this assay, and inadequate number of viral copies(<138 copies/mL). A negative result must be combined with clinical observations, patient history, and epidemiological information. The expected result is Negative.  Fact Sheet for Patients:  EntrepreneurPulse.com.au  Fact Sheet for Healthcare Providers:  IncredibleEmployment.be  This test is no t yet approved or cleared by the [place] FDA and  has been authorized for detection and/or diagnosis of SARS-CoV-2 by FDA under an Emergency Use Authorization (EUA). This EUA will remain  in effect (meaning this test can be used) for the duration of the COVID-19 declaration under Section 564(b)(1) of the Act, 21 U.S.C.section 360bbb-3(b)(1), unless the authorization is terminated  or revoked sooner.       Influenza A by PCR NEGATIVE NEGATIVE Final   Influenza B by PCR NEGATIVE NEGATIVE Final    Comment: (NOTE) The Xpert Xpress SARS-CoV-2/FLU/RSV plus assay is intended as an aid in the diagnosis of influenza from Nasopharyngeal swab specimens and should not be used as a sole basis for treatment. Nasal washings and aspirates are unacceptable for Xpert Xpress SARS-CoV-2/FLU/RSV testing.  Fact Sheet for Patients: EntrepreneurPulse.com.au  Fact Sheet for Healthcare Providers: IncredibleEmployment.be  This test is not yet approved or cleared by the [place] FDA  and has been authorized for detection and/or diagnosis of SARS-CoV-2 by FDA under an Emergency Use Authorization (EUA). This EUA will remain in effect (meaning this test can be used) for the duration of the COVID-19 declaration under Section 564(b)(1) of the Act, 21 U.S.C. section 360bbb-3(b)(1), unless the authorization is terminated or revoked.  Performed at [facility], [place] [address] [place], [place] [address]       RN Pressure Injury Documentation:     Estimated body mass index is 35.5 kg/m as calculated from the following:   Height as of this encounter: 5' 3"  (1.6 m).   Weight as of this encounter: 90.9 kg.  Malnutrition Type:      Malnutrition Characteristics:      Nutrition Interventions:        Radiology Studies: DG CHEST PORT 1 VIEW  Result Date: 09/27/2020 CLINICAL DATA:  COPD. EXAM: PORTABLE CHEST  1 VIEW COMPARISON:  September 26, 2020. FINDINGS: Stable cardiomegaly is noted. Central pulmonary vascular congestion is again noted. Stable bibasilar interstitial opacities are noted concerning for pulmonary edema with associated pleural effusions. Bony thorax is unremarkable. IMPRESSION: Stable cardiomegaly with central pulmonary vascular congestion and bibasilar pulmonary edema and associated effusions. Aortic Atherosclerosis (ICD10-I70.0). Electronically Signed   By: Marijo Conception M.D.   On: 09/27/2020 08:13   DG CHEST PORT 1 VIEW  Result Date: 09/26/2020 CLINICAL DATA:  Respiratory failure, history COPD EXAM: PORTABLE CHEST 1 VIEW COMPARISON:  Portable exam 0459 hours compared 09/25/2020 FINDINGS: Enlargement of cardiac silhouette with pulmonary vascular congestion. Atherosclerotic calcification aorta. BILATERAL pulmonary infiltrates greatest at bases. This could represent pulmonary edema or multifocal pneumonia. Probable RIGHT pleural effusion. No pneumothorax. Bones demineralized. IMPRESSION: Enlargement of cardiac silhouette with pulmonary  vascular congestion. Persistent BILATERAL pulmonary infiltrates greatest at bases question pulmonary edema versus multifocal infection. Probable RIGHT pleural effusion. Electronically Signed   By: Lavonia Dana M.D.   On: 09/26/2020 08:21    Scheduled Meds:  apixaban  5 mg Oral BID   arformoterol  15 mcg Nebulization BID   budesonide (PULMICORT) nebulizer solution  0.5 mg Nebulization BID   Chlorhexidine Gluconate Cloth  6 each Topical Daily   diltiazem  120 mg Oral Daily   furosemide  80 mg Intravenous BID   guaiFENesin  600 mg Oral BID   mouth rinse  15 mL Mouth Rinse BID   methylPREDNISolone (SOLU-MEDROL) injection  40 mg Intravenous Q12H   revefenacin  175 mcg Nebulization Daily   Continuous Infusions:  cefTRIAXone (ROCEPHIN)  IV Stopped (09/26/20 1750)    LOS: 5 days   Kerney Elbe, DO [facility] PAGER is on AMION  If 7PM-7AM, please contact night-coverage [email or web address]

## 2020-09-27 NOTE — Progress Notes (Signed)
NAME:  Kimberly Ballard, MRN:  [id number], DOB:  03/22/1940, LOS: 5 ADMISSION DATE:  09/22/2020, CONSULTATION DATE:  8/23 REFERRING MD:  Kimberly Ballard, CHIEF COMPLAINT:  Dyspnea, cough   History of Present Illness:  79 y/o femal presented to the [facility] ED with dyspnea, body aches, cough on 8/22 after a significant COVID exposure.  Her daughter had COVID and the patient developed symptoms around 8/16.  She discussed with her PCP and a paxlovid Rx was send on 8/18.  At [facility] she has had 2 negative tests for COVID (both PCR), but her IgG antibody was also negative. Per PCP notes she has received the primary series COVID vaccine as well as 2 boosters.    Pertinent  Medical History  COPD Atrial fibrillation Former cigarette smoker  Significant Hospital Events: Including procedures, antibiotic start and stop dates in addition to other pertinent events   8/22 > admit.  Admitted to stepdown unit.  Cough weak.  Pulmonary asked to evaluate given concern for progressive respiratory failure.  Initial COVID by respiratory panel was negative.  As was influenza.  Respiratory viral panel negative.  Still placed on isolation protocol given history.  IV ceftriaxone and azithromycin initiated.  Given IV Solu-Medrol followed by oral prednisone order. 8/23 pulmonary asked to evaluate given worsening respiratory failure.  CT chest showing lower lobe consolidation.  There was concern about possible aspiration.  Echo obtained showed good left ventricular function with mild pulmonary artery hypertension. RUQ ultrasound showed hepatic steatosis, possibly cirrhosis based on nodular liver contour, no gallbladder 8/24 cardiology consulted for elevated troponins.  This was felt secondary to worsening hypoxia and underlying pulmonary artery hypertension which was felt previously undiagnosed issue.  New onset atrial fibrillation with RVR.  CT angiogram ordered.  Increased oxygen requirement overnight, requiring BiPAP, later placed on 15 L/min  via high flow.  Lower extremity ultrasound negative for DVT 8/25: u strep neg.  still high FIO2. F/u COVID neg. Transitioned to oral CCB and DOAC. Getting IV lasix. Transitioned to headed High flow  during day and BIPAP at night.  8/26 still w/ overall net positive fluid balance over hospital stay. CXR w/ worse edema. Lasix increased to 80 mg q 12. Azithromycin stopped (day 5) Steroids reduced.  Cardiology signed off. (See note from cards w/ plan to follow as out-pt)  8/27 diuresed, CXR better, eating, wants to get out of bed  Interim History / Subjective:  8/27 diuresed, CXR better, eating, wants to get out of bed  Objective   Blood pressure (!) 148/76, pulse 64, temperature 97.7 F (36.5 C), temperature source Axillary, resp. rate 19, height 5\' 3"  (1.6 m), weight 90.9 kg, SpO2 91 %.    FiO2 (%):  [60 %-100 %] 80 %   Intake/Output Summary (Last 24 hours) at 09/27/2020 0724 Last data filed at 09/27/2020 0600 Gross per 24 hour  Intake 334.79 ml  Output 4450 ml  Net -4115.21 ml   Filed Weights   09/22/20 2020  Weight: 90.9 kg    Examination:  General:  Chronically ill appearing but eating and resting comfortably in bed HENT: NCAT OP clear PULM: Crackles bases B, normal effort CV: Irreg irreg, no mgr GI: BS+, soft, nontender MSK: normal bulk and tone Neuro: awake, alert, no distress, MAEW  8/27 CXR images personally reviewed> improved interstitial edema in lungs bilaterally, basilar consolidation persists   Resolved Hospital Problem list     Assessment & Plan:  Multifactorial Acute on chronic hypercapnic and hypoxemic respiratory failure  with underlying COPD, emphysema seen on CT chest with atelectasis, pulmonary infiltrates in lower lobes due to community acquired pneumonia.  Initial concern for COVID given high risk exposure, no clear evidence of that based on repeated swab testing.   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 Acute pulmonary edema due to acute  decompensated diastolic heart failure Maintain in ICU Mobilize: out of bed today DNR code status per my conversation with patient today Continue diuresis, seems to be helping Continue steroids for now, no clear evidence of non-infectious inflammatory lung disease but no clear evidence of harm from steroids right now Check serology panel to look for systemic problems that can lead to inflammatory lung disease Continue heated high flow and nightly and prn NIMV  COPD, acute exacerbation Brovana/pulmicort/yupelri to continue Prn xopenex given atrial fibrillation  Demand ischemia 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  She wants to continue to try to get out of the hospital but she doesn't want to go on life support or receive CPR]  Labs   CBC: Recent Labs  Lab 09/22/20 1441 09/23/20 0207 09/24/20 0323 09/25/20 0447 09/26/20 0303 09/27/20 0311  WBC 21.6* 19.7* 16.5* 18.6* 14.2* 15.1*  NEUTROABS 16.7* 16.9*  --   --   --  13.4*  HGB 15.6* 15.7* 15.4* 15.4* 15.8* 16.3*  HCT 47.4* 49.4* 49.4* 49.7* 51.3* 50.2*  MCV 98.3 100.6* 100.8* 102.5* 102.6* 98.2  PLT 283 253 318 349 329 325    Basic Metabolic Panel: Recent Labs  Lab 09/22/20 1500 09/23/20 0207 09/24/20 0323 09/25/20 0447 09/26/20 0303 09/26/20 0903 09/26/20 1724 09/27/20 0311  NA  --  139 141 142 142 140 140 145  K  --  3.9 3.6 4.6  4.9 4.8 4.4 4.2  CL  --  102 99 104 101 100 95* 96*  CO2  --  25 30 33* 32 34* 34* 41*  GLUCOSE  --  141* 187* 178* 168* 185* 167* 175*  BUN  --  69* 58* 43* 37* 36* 36* 38*  CREATININE  --  1.04* 0.75 0.68 0.58 0.56 0.65 0.57  CALCIUM  --  8.3* 8.3* 8.4* 8.6* 8.6* 8.9 8.8*  MG 2.6* 2.7* 2.8* 2.7* 2.8*  --   --  2.4  PHOS 3.8 3.5  --  3.2 3.6  --   --  4.3   GFR: Estimated Creatinine Clearance: 61 mL/min (by C-G formula based on SCr of 0.57 mg/dL). Recent Labs  Lab 09/22/20 1441 09/23/20 0207 09/24/20 0323 09/25/20 0447 09/26/20 0303 09/27/20 0311  PROCALCITON 0.48 0.49 0.23  --   --   --   WBC 21.6* 19.7* 16.5* 18.6* 14.2* 15.1*  LATICACIDVEN 1.7  --   --   --   --   --     Liver Function Tests: Recent Labs  Lab 09/25/20 0838 09/26/20 0303 09/26/20 0903 09/26/20 1724 09/27/20 0311  AST 87* 60* 50* 47* 35  ALT 228* 198* 191* 183* 149*  ALKPHOS 97 91  90 92 75  BILITOT 0.7 0.7 0.9 0.9 0.7  PROT 6.5 6.5 6.7 7.0 6.4*  ALBUMIN 2.7* 2.8* 2.9* 2.9* 2.6*   No results for input(s): LIPASE, AMYLASE in the last 168 hours. No results for input(s): AMMONIA in the last 168 hours.  ABG    Component Value Date/Time   PHART 7.325 (L) 09/25/2020 0320   PCO2ART 67.6 (HH) 09/25/2020 0320   PO2ART 130 (H) 09/25/2020 0320   HCO3 34.2 (H) 09/25/2020 0320   O2SAT 98.6 09/25/2020 0320     Coagulation Profile: Recent Labs  Lab 09/22/20 2050  INR 1.3*    Cardiac Enzymes: Recent Labs  Lab 09/22/20 2148  CKTOTAL 60    HbA1C: No results found for: HGBA1C  CBG: No results for input(s): GLUCAP in the last 168 hours.  Critical care time: 35 minutes     Heber [place], MD [facility] Pager: [phone] Cell: [phone] After 7:00 pm call Elink  [phone]

## 2020-09-28 DIAGNOSIS — I4891 Unspecified atrial fibrillation: Secondary | ICD-10-CM | POA: Diagnosis not present

## 2020-09-28 DIAGNOSIS — I517 Cardiomegaly: Secondary | ICD-10-CM | POA: Diagnosis not present

## 2020-09-28 DIAGNOSIS — J9601 Acute respiratory failure with hypoxia: Secondary | ICD-10-CM | POA: Diagnosis not present

## 2020-09-28 DIAGNOSIS — J9621 Acute and chronic respiratory failure with hypoxia: Secondary | ICD-10-CM | POA: Diagnosis not present

## 2020-09-28 DIAGNOSIS — R7989 Other specified abnormal findings of blood chemistry: Secondary | ICD-10-CM | POA: Diagnosis not present

## 2020-09-28 DIAGNOSIS — J189 Pneumonia, unspecified organism: Secondary | ICD-10-CM | POA: Diagnosis not present

## 2020-09-28 LAB — COMPREHENSIVE METABOLIC PANEL
ALT: 129 U/L — ABNORMAL HIGH (ref 0–44)
AST: 37 U/L (ref 15–41)
Albumin: 2.8 g/dL — ABNORMAL LOW (ref 3.5–5.0)
Alkaline Phosphatase: 78 U/L (ref 38–126)
Anion gap: 13 (ref 5–15)
BUN: 46 mg/dL — ABNORMAL HIGH (ref 8–23)
CO2: 39 mmol/L — ABNORMAL HIGH (ref 22–32)
Calcium: 8.7 mg/dL — ABNORMAL LOW (ref 8.9–10.3)
Chloride: 88 mmol/L — ABNORMAL LOW (ref 98–111)
Creatinine, Ser: 0.7 mg/dL (ref 0.44–1.00)
GFR, Estimated: >60 mL/min (ref >60)
Glucose, Bld: 152 mg/dL — ABNORMAL HIGH (ref 70–99)
Potassium: 3.8 mmol/L (ref 3.5–5.1)
Sodium: 140 mmol/L (ref 135–145)
Total Bilirubin: 0.8 mg/dL (ref 0.3–1.2)
Total Protein: 6.5 g/dL (ref 6.5–8.1)

## 2020-09-28 LAB — CBC WITH DIFFERENTIAL/PLATELET
Abs Immature Granulocytes: 0.42 10*3/uL — ABNORMAL HIGH (ref 0.00–0.07)
Basophils Absolute: 0.1 10*3/uL (ref 0.0–0.1)
Basophils Relative: 1 %
Eosinophils Absolute: 0 10*3/uL (ref 0.0–0.5)
Eosinophils Relative: 0 %
HCT: 53.8 % — ABNORMAL HIGH (ref 36.0–46.0)
Hemoglobin: 17.6 g/dL — ABNORMAL HIGH (ref 12.0–15.0)
Immature Granulocytes: 2 %
Lymphocytes Relative: 3 %
Lymphs Abs: 0.7 10*3/uL (ref 0.7–4.0)
MCH: 31.4 pg (ref 26.0–34.0)
MCHC: 32.7 g/dL (ref 30.0–36.0)
MCV: 96.1 fL (ref 80.0–100.0)
Monocytes Absolute: 0.7 10*3/uL (ref 0.1–1.0)
Monocytes Relative: 4 %
Neutro Abs: 17.3 10*3/uL — ABNORMAL HIGH (ref 1.7–7.7)
Neutrophils Relative %: 90 %
Platelets: 370 10*3/uL (ref 150–400)
RBC: 5.6 MIL/uL — ABNORMAL HIGH (ref 3.87–5.11)
RDW: 13.6 % (ref 11.5–15.5)
WBC: 19.2 10*3/uL — ABNORMAL HIGH (ref 4.0–10.5)
nRBC: 0 % (ref 0.0–0.2)

## 2020-09-28 LAB — QUANTIFERON-TB GOLD PLUS (RQFGPL)
QuantiFERON Mitogen Value: 0.01 IU/mL
QuantiFERON Nil Value: 0 IU/mL
QuantiFERON TB1 Ag Value: 0 IU/mL
QuantiFERON TB2 Ag Value: 0 IU/mL

## 2020-09-28 LAB — QUANTIFERON-TB GOLD PLUS: QuantiFERON-TB Gold Plus: UNDETERMINED — AB

## 2020-09-28 LAB — MAGNESIUM: Magnesium: 2.4 mg/dL (ref 1.7–2.4)

## 2020-09-28 LAB — PHOSPHORUS: Phosphorus: 4.5 mg/dL (ref 2.5–4.6)

## 2020-09-28 MED ORDER — SODIUM CHLORIDE 0.9 % IV SOLN
INTRAVENOUS | Status: DC | PRN
  Administered 2020-09-28 – 2020-10-15 (×2): 250 mL via INTRAVENOUS

## 2020-09-28 MED ORDER — POLYETHYLENE GLYCOL 3350 17 G PO PACK
17.0000 g | PACK | Freq: Two times a day (BID) | ORAL | Status: DC
  Administered 2020-09-29 – 2020-10-08 (×12): 17 g via ORAL
  Filled 2020-09-28 (×10): qty 1

## 2020-09-28 NOTE — Progress Notes (Signed)
NAME:  Kimberly Ballard, MRN:  [id number], DOB:  [birth year], LOS: 6 ADMISSION DATE:  09/22/2020, CONSULTATION DATE:  8/23 REFERRING MD:  Adela Glimpse, CHIEF COMPLAINT:  Dyspnea, cough   History of Present Illness:  80 y/o femal presented to the [facility] ED with dyspnea, body aches, cough on 8/22 after a significant COVID exposure.  Her daughter had COVID and the patient developed symptoms around 8/16.  She discussed with her PCP and a paxlovid Rx was send on 8/18.  At [facility] she has had 2 negative tests for COVID (both PCR), but her IgG antibody was also negative. Per PCP notes she has received the primary series COVID vaccine as well as 2 boosters.    Pertinent  Medical History  COPD Atrial fibrillation Former cigarette smoker  Significant Hospital Events: Including procedures, antibiotic start and stop dates in addition to other pertinent events   8/22 > admit.  Admitted to stepdown unit.  Cough weak.  Pulmonary asked to evaluate given concern for progressive respiratory failure.  Initial COVID by respiratory panel was negative.  As was influenza.  Respiratory viral panel negative.  Still placed on isolation protocol given history.  IV ceftriaxone and azithromycin initiated.  Given IV Solu-Medrol followed by oral prednisone order. 8/23 pulmonary asked to evaluate given worsening respiratory failure.  CT chest showing lower lobe consolidation.  There was concern about possible aspiration.  Echo obtained showed good left ventricular function with mild pulmonary artery hypertension. RUQ ultrasound showed hepatic steatosis, possibly cirrhosis based on nodular liver contour, no gallbladder 8/24 cardiology consulted for elevated troponins.  This was felt secondary to worsening hypoxia and underlying pulmonary artery hypertension which was felt previously undiagnosed issue.  New onset atrial fibrillation with RVR.  CT angiogram ordered.  Increased oxygen requirement overnight, requiring BiPAP, later placed on 15 L/min  via high flow.  Lower extremity ultrasound negative for DVT 8/25: u strep neg.  still high FIO2. F/u COVID neg. Transitioned to oral CCB and DOAC. Getting IV lasix. Transitioned to headed High flow [place] during day and BIPAP at night.  8/26 still w/ overall net positive fluid balance over hospital stay. CXR w/ worse edema. Lasix increased to 80 mg q 12. Azithromycin stopped (day 5) Steroids reduced.  Cardiology signed off. (See note from cards w/ plan to follow as out-pt)  8/27 diuresed, CXR better, eating, got up to chair  Interim History / Subjective:   Was out of bed to chair yesterday Stood again this morning and got in chair FiO2 increased yesterday, but flow rate on HHF decreased   Objective   Blood pressure 120/84, pulse 99, temperature 97.6 F (36.4 C), temperature source Axillary, resp. rate 17, height 5\' 3"  (1.6 m), weight 90.9 kg, SpO2 92 %.    FiO2 (%):  [80 %-100 %] 80 %   Intake/Output Summary (Last 24 hours) at 09/28/2020 0725 Last data filed at 09/28/2020 0532 Gross per 24 hour  Intake 1430 ml  Output 4076 ml  Net -2646 ml   Filed Weights   09/22/20 2020  Weight: 90.9 kg    Examination:  General:  Chronically ill appearing, resting comfortably in bed HENT: NCAT OP clear PULM: Crackles bases B, normal effort CV: Irreg irreg, no mgr GI: BS+, soft, nontender MSK: normal bulk and tone Neuro: awake, alert, no distress, MAEW   8/27 CXR images personally reviewed> improved interstitial edema in lungs bilaterally, basilar consolidation persists   Resolved Hospital Problem list     Assessment & Plan:  Multifactorial Acute on chronic hypercapnic and hypoxemic respiratory failure with underlying COPD, emphysema seen on CT chest with atelectasis, pulmonary infiltrates in lower lobes due to community acquired pneumonia.  Initial concern for COVID given high risk exposure, no clear evidence of that based on repeated swab testing.   Possible shunting from cirrhosis  related AVM Acute pulmonary edema due to acute decompensated diastolic heart failure Maintain in ICU Out of bed again today Continue diuresis Continue steroids at current dosing Follow up serology panel sent on 8/28 AM Continue antibiotics Tolerate periods of hypoxemia, goal at rest is greater than 85% SaO2, with movement ideally above 75% Decision for NIMV should be based on a change in mental status or physical evidence of ventilatory failure such as nasal flaring, accessory muscle use, paradoxical breathing Out of bed to chair as able Incentive spirometry is important, use every hour  COPD, acute exacerbation Brovana/pulmicort/yupelri to continue Xopenex prn Steroids to continue, dosing driven more by infiltrates than wheezing  Demand ischemia 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 Acute decompensated diastolic heart failure Tele Continue diuresis  Atrial fibrillation with RVR Tele Eliquis Diltiazem to continue  Steroid induced hyperglycemia SSI  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  She wants to continue to try to get out of the hospital but she doesn't want to go on life support or receive CPR. I called her son Arlys John to discuss and he agreed.]   Labs   CBC: Recent Labs  Lab 09/22/20 1441 09/23/20 0207 09/24/20 0323 09/25/20 0447 09/26/20 0303 09/27/20 0311 09/28/20 0311  WBC 21.6* 19.7* 16.5* 18.6* 14.2* 15.1* 19.2*  NEUTROABS 16.7*  16.9*  --   --   --  13.4* 17.3*  HGB 15.6* 15.7* 15.4* 15.4* 15.8* 16.3* 17.6*  HCT 47.4* 49.4* 49.4* 49.7* 51.3* 50.2* 53.8*  MCV 98.3 100.6* 100.8* 102.5* 102.6* 98.2 96.1  PLT 283 253 318 349 329 325 370    Basic Metabolic Panel: Recent Labs  Lab 09/23/20 0207 09/24/20 0323 09/25/20 0447 09/26/20 0303 09/26/20 0903 09/26/20 1724 09/27/20 0311 09/27/20 1824 09/28/20 0311  NA 139 141 142 142 140 140 145 140 140  K 3.9 3.6 4.6 4.9 4.8 4.4 4.2 3.8 3.8  CL 102 99 104 101 100 95* 96* 84* 88*  CO2 25 30 33* 32 34* 34* 41* 39* 39*  GLUCOSE 141* 187* 178* 168* 185* 167* 175* 198* 152*  BUN 69* 58* 43* 37* 36* 36* 38* 45* 46*  CREATININE 1.04* 0.75 0.68 0.58 0.56 0.65 0.57 0.82 0.70  CALCIUM 8.3* 8.3* 8.4* 8.6* 8.6* 8.9 8.8* 8.8* 8.7*  MG 2.7* 2.8* 2.7* 2.8*  --   --  2.4  --  2.4  PHOS 3.5  --  3.2 3.6  --   --  4.3  --  4.5   GFR: Estimated Creatinine Clearance: 61 mL/min (by C-G formula based on SCr of 0.7 mg/dL). Recent Labs  Lab 09/22/20 1441 09/23/20 0207 09/24/20 7062 09/25/20 3762 09/26/20 0303 09/27/20 8315 09/28/20 1761  PROCALCITON 0.48 0.49 0.23  --   --   --   --   WBC 21.6* 19.7* 16.5* 18.6* 14.2* 15.1* 19.2*  LATICACIDVEN 1.7  --   --   --   --   --   --     Liver Function Tests: Recent Labs  Lab 09/26/20 0903 09/26/20 1724 09/27/20 0311 09/27/20 1824 09/28/20 0311  AST 50* 47* 35 42* 37  ALT 191* 183* 149* 146* 129*  ALKPHOS 90 92 75 89 78  BILITOT 0.9 0.9 0.7 1.1 0.8  PROT 6.7 7.0 6.4* 7.2 6.5  ALBUMIN 2.9* 2.9* 2.6* 3.0* 2.8*   No results for input(s): LIPASE, AMYLASE in the last 168 hours. No results for input(s): AMMONIA in the last 168 hours.  ABG    Component Value Date/Time   PHART 7.325 (L) 09/25/2020 0320   PCO2ART 67.6 (HH) 09/25/2020 0320   PO2ART 130 (H) 09/25/2020 0320   HCO3 34.2 (H) 09/25/2020 0320   O2SAT 98.6 09/25/2020 0320     Coagulation Profile: Recent Labs  Lab 09/22/20 2050  INR 1.3*    Cardiac  Enzymes: Recent Labs  Lab 09/22/20 2148  CKTOTAL 60    HbA1C: No results found for: HGBA1C  CBG: Recent Labs  Lab 09/27/20 1158  GLUCAP 183*    Critical care time: 31 minutes     Heber [place], MD [place] [facility] Pager: [phone] Cell: [phone] After 7:00 pm call Elink  [phone]

## 2020-09-28 NOTE — Progress Notes (Signed)
PROGRESS NOTE    Kimberly Ballard  OVF:[id number] DOB: 1940-11-10 DOA: 09/22/2020 PCP: System, Provider Not In  Brief Narrative:  The patient is 80 year old female with history of COPD presented with shortness of breath, body aches, decreased appetite and coughing.  Patients daughter was diagnosed with COVID last week.  She was started on Paxilovid empirically due to risk of exposure.  Patient continued to get worse and developed shortness of breath.  On EMS arrival she required 6 L/min of oxygen.  In the ED SARS-CoV-2 RT-PCR was negative.  Chest x-ray showed bilateral infiltrates, CTA was negative for PE but showed evidence of bilateral infiltrates.  Patient started on Rocephin and Zithromax for community-acquired pneumonia.  Subsequently she decompensated and worsened and went into A. fib with RVR and had to be placed on BiPAP and she is subsequently moved to the stepdown unit.  We are empirically treating for COVID and pulmonary has been consulted as well as cardiology.  Cardiology started the patient on Cardizem drip and pulmonary recommending continuing steroids.  She is weaned to 15 L nonrebreather and remains in A. fib without RVR so her Cardizem is being changed to 180 mg p.o. daily and changing heparin to Eliquis and discontinue aspirin.  Cardiology recommends continue Lasix but now the dose is changed to IV Lasix twice daily 80 mg.  She was given IV Lasix 40 mg yesterday.  Cardiology feels that she has some diastolic CHF in the setting of A. fib with RVR and they are planning on repeating echo in 6 weeks.  Cardiology recommends continuing Cardizem 120 mg daily and Eliquis 5 mg twice daily.    Respiratory status is slowly improving and after goals of care discussion with pulmonary she was made DNR.  We are recommending continue to get out of bed with assistance and recommending continue diuresis and steroids at current dosing.  We will need to follow-up with nephrology panel patent and pulmonary now  recommends tolerating periods of hypoxemia with a go at rest greater than 85% and ideally would 75%  Assessment & Plan:   Active Problems:   CAP (community acquired pneumonia)   Acute respiratory failure with hypoxia and hypercapnia (HCC)   Sepsis ([facility])   Elevated LFTs   Cardiomegaly   Elevated troponin   Atrial fibrillation (HCC)   Acute and chronic respiratory failure (acute-on-chronic) (HCC)  Acute respiratory failure with hypoxia requiring noninvasive positive pressure ventilation with BiPAP 12/6 60% FiO2 -Multifactorial from underlying multifocal pneumonia, diastolic CHF -Patient is currently on Rocephin and Zithromax and will continue -Follow blood culture results -She is given IV Lasix 40 mg daily for last 3 days but now she will be getting IV 80 mg twice daily per pulmonary recommendations -CTA of the Chest PE protocol done and showed "No evidence of acute pulmonary embolism. Patchy consolidative opacities in the lung bases and  lingula have slightly worsened compared to the prior study and remain concerning for multifocal pneumonia, with differential including COVID. As before, follow-up chest CT in 3 months is recommended to assess for resolution. Unchanged cardiomegaly with evidence of right heart failure and pulmonary hypertension as above. Coronary artery calcifications and Aortic Atherosclerosis (ICD10-I70.0). -SpO2: 92 % O2 Flow Rate (L/min): 30 L/min FiO2 (%): 80 %; was on 15 L nonrebreather and now oxygen requirement is improving -Inflammatory Markers No results for input(s): DDIMER, FERRITIN, LDH, CRP in the last 72 hours. -ABG    Component Value Date/Time   PHART 7.325 (L) 09/25/2020 0320  PCO2ART 67.6 (HH) 09/25/2020 0320   PO2ART 130 (H) 09/25/2020 0320   HCO3 34.2 (H) 09/25/2020 0320   O2SAT 98.6 09/25/2020 0320      Lab Results  Component Value Date   SARSCOV2NAA NEGATIVE 09/24/2020   [place] NEGATIVE 09/22/2020   -Leukocytosis is resolving and  went from 18.6 is now 14.2 yesterday and is now 15.1 -Speech therapy consulted for swallow evaluation -Appreciate PCCM consult and they placed the patient on BiPAP but has now been weaned to 15 L; pulmonary feels that this is clinically COVID given that hypoxemia and hypercapnia however now her isolation precautions have been discontinued -Pulmonary recommends continuing BiPAP nightly and 2 hours twice a day -They are recommending nebs with Brovana/Pulmicort/Yupelri as below -Steroids are being completed however pulmonary recommends continue steroids for now and she will be continued on Solu-Medrol 40 mg every 12 -Continuing Xopenex 1.25 mg nebs every 4 hours for wheezing shortness of breath as well as revefenacin 175 mcg nebulized daily and budesonide 0.5 mg neb twice daily -She remains on IV ceftriaxone and azithromycin has now been discontinued. -Continue with IV Lasix per pulmonary recommendations with IV Lasix 80 mg twice daily; she is - 7.285 L -Clear liquid diet was initiated yesterday but now is advance to oral diet -Pulmonary recommends continue diuresis -CXR yesterday showed "Stable cardiomegaly with central pulmonary vascular congestion and bibasilar pulmonary edema and associated effusions. Aortic Atherosclerosis."  -Pulmonary also recommends maintaining ICU today mobilizing getting up and out of bed; pulmonary is also checking serology for looking for systemic problems that can lead to inflammatory lung disease and serology panel has been sent and pending -Repeat chest x-ray in a.m.  A. fib with RVR; now RVR is improved -Heart rates were ranging in the mid 100s and now in the 90s -She was placed on a Cardizem drip and now cardiology had changed to 180 mg p.o. Cardizem -Currently on anticoagulation with IV heparin given her CHA2DS2-VASc score of 3 but will now be changed to p.o. Eliquis by cardiology -Continue to monitor on telemetry and cardiology recommends decreasing the Cardizem to  120 mg daily and continuing p.o. Eliquis  AKI -Resolving; BUN/Cr is now 46/0.70 -Patient's BUNs/creatinine was elevated to 69/1.28 -Avoid nephrotoxic medications, contrast dyes, hypotension and renally dose medications -Continue monitor and trend renal function and repeat CMP in a.m.   Acute Diastolic CHF/right ventricular function/pulmonary hypertension -Echocardiogram is currently pending -She has bilateral crackles on auscultation -Cardiology recommends continuing diuresis with IV Lasix and now Pulmonary is diuresing with IV Lasix 80 mg BID -BNP was elevated but cardiology feels that this may be underlying related to respiratory issues -Strict I's and O's and daily weights and she - 7.2 L as above -Given her history of COPD with acute exacerbation multifocal pneumonia, cardiology feels that the hypoxemia is resulting in acute right ventricular strain with right ventricular dilatation and right ventricular dysfunction -Patient has moderate pulm hypertension on echo related to underlying COPD with acute exacerbation in the setting of hypoxemia -Repeat CTA PE as above -BNP was elevated at 889.7 and worsened and BNP was elevated to 964.7 -Cardiology has been consulted and is now signed off on diuresis and now managed by pulmonary   Elevated Troponin -Likely demand ischemia -hSTrop 12 -> 100 and then trended down to 52 -EKG shows T wave abnormalities in anterior chest leads -Cardiology has been consulted and have now signed off the case   Abnormal LFTs/Transaminitis -Unclear etiology -?  Passive liver congestion from CHF -  Total bilirubin is normal, alk phos is normal -LFTs on admission showed an AST of 503 and ALT of 439; now improving as AST is now 37 and ALT is 129  Constipation -Continue bowel regimen with senna docusate 1 tab p.o. nightly as needed for mild constipation we will add MiraLAX 17 g p.o. twice daily  Right middle lobe nodule -Will need repeat CT in 3 months after  treatment of pneumonia   Obesity -Complicates overall prognosis and care -Estimated body mass index is 35.5 kg/m as calculated from the following:   Height as of this encounter: 5' 3"  (1.6 m).   Weight as of this encounter: 90.9 kg. -Weight Loss and Dietary Counseling   DVT prophylaxis: Anticoagulated with heparin drip Code Status: FULL CODE  Family Communication: No family currently at bedside Disposition Plan: Continue to monitor in the stepdown unit  Status is: Inpatient  Remains inpatient appropriate because:Unsafe d/c plan, IV treatments appropriate due to intensity of illness or inability to take PO, and Inpatient level of care appropriate due to severity of illness  Dispo: The patient is from: Home              Anticipated d/c is to:  TBD              Patient currently is not medically stable to d/c.   Difficult to place patient No   Consultants:  Pulmonary/PCCM Cardiology  Procedures:  ECHOCARDIOGRAM IMPRESSIONS     1. Left ventricular ejection fraction, by estimation, is 55 to 60%. Left  ventricular ejection fraction by 3D volume is 59 %. The left ventricle has  normal function. The left ventricle has no regional wall motion  abnormalities. There is moderate left  ventricular hypertrophy. Left ventricular diastolic parameters are  consistent with Grade I diastolic dysfunction (impaired relaxation).   2. Right ventricular systolic function mildly reduced at base and mid,  with relative preserved apical systolic function. The right ventricular  size is moderately enlarged. There is moderately elevated pulmonary artery  systolic pressure. The estimated  right ventricular systolic pressure is 93.7 mmHg.   3. Left atrial size was mildly dilated.   4. Right atrial size was moderately dilated.   5. The mitral valve is normal in structure. Trivial mitral valve  regurgitation. No evidence of mitral stenosis.   6. Tricuspid valve regurgitation is mild to moderate.    7. The aortic valve is normal in structure. Aortic valve regurgitation is  not visualized. No aortic stenosis is present.   8. The inferior vena cava is dilated in size with <50% respiratory  variability, suggesting right atrial pressure of 15 mmHg.   FINDINGS   Left Ventricle: Left ventricular ejection fraction, by estimation, is 55  to 60%. Left ventricular ejection fraction by 3D volume is 59 %. The left  ventricle has normal function. The left ventricle has no regional wall  motion abnormalities. The left  ventricular internal cavity size was normal in size. There is moderate  left ventricular hypertrophy. Left ventricular diastolic parameters are  consistent with Grade I diastolic dysfunction (impaired relaxation).   Right Ventricle: Calcified moderate band. The right ventricular size is  moderately enlarged. No increase in right ventricular wall thickness.  Right ventricular systolic function mildly reduced at base and mid, with  relative preserved apical systolic  function. There is moderately elevated pulmonary artery systolic pressure.  The tricuspid regurgitant velocity is 2.89 m/s, and with an assumed right  atrial pressure of 15 mmHg, the estimated  right ventricular systolic  pressure is 38.9 mmHg.   Left Atrium: Left atrial size was mildly dilated.   Right Atrium: Right atrial size was moderately dilated.   Pericardium: There is no evidence of pericardial effusion. Presence of  pericardial fat pad.   Mitral Valve: The mitral valve is normal in structure. Mild mitral annular  calcification. Trivial mitral valve regurgitation. No evidence of mitral  valve stenosis.   Tricuspid Valve: The tricuspid valve is normal in structure. Tricuspid  valve regurgitation is mild to moderate. No evidence of tricuspid  stenosis.   Aortic Valve: The aortic valve is normal in structure. Aortic valve  regurgitation is not visualized. No aortic stenosis is present.   Pulmonic Valve:  The pulmonic valve was normal in structure. Pulmonic valve  regurgitation is trivial. No evidence of pulmonic stenosis.   Aorta: Aorta dimension upper limit of normal for age and BSA. The aortic  root is normal in size and structure.   Venous: The inferior vena cava is dilated in size with less than 50%  respiratory variability, suggesting right atrial pressure of 15 mmHg.   IAS/Shunts: The interatrial septum appears to be lipomatous. No atrial  level shunt detected by color flow Doppler.      LEFT VENTRICLE  PLAX 2D  LVIDd:         3.60 cm         Diastology  LVIDs:         2.50 cm         LV e' medial:    6.31 cm/s  LV PW:         1.30 cm         LV E/e' medial:  9.0  LV IVS:        1.30 cm         LV e' lateral:   10.20 cm/s  LVOT diam:     2.00 cm         LV E/e' lateral: 5.6  LV SV:         54  LV SV Index:   28  LVOT Area:     3.14 cm        3D Volume EF                                 LV 3D EF:    Left                                              ventricul  LV Volumes (MOD)                            ar  LV vol d, MOD    73.9 ml                    ejection  A2C:                                        fraction  LV vol d, MOD    53.3 ml                    by  3D  A4C:                                        volume is  LV vol s, MOD    28.2 ml                    59 %.  A2C:  LV vol s, MOD    20.1 ml  A4C:                           3D Volume EF:  LV SV MOD A2C:   45.7 ml       3D EF:        59 %  LV SV MOD A4C:   53.3 ml  LV SV MOD BP:    38.1 ml   RIGHT VENTRICLE             IVC  RV S prime:     10.60 cm/s  IVC diam: 2.90 cm  TAPSE (M-mode): 1.8 cm   LEFT ATRIUM             Index       RIGHT ATRIUM           Index  LA diam:        3.90 cm 2.02 cm/m  RA Area:     25.80 cm  LA Vol (A2C):   33.2 ml 17.16 ml/m RA Volume:   96.90 ml  50.07 ml/m  LA Vol (A4C):   54.8 ml 28.32 ml/m  LA Biplane Vol: 43.4 ml 22.43 ml/m   AORTIC VALVE             PULMONIC VALVE  LVOT  Vmax:   95.70 cm/s  PR End Diast Vel: 1.35 msec  LVOT Vmean:  61.300 cm/s  LVOT VTI:    0.172 m     AORTA  Ao Root diam: 3.70 cm  Ao Asc diam:  3.80 cm   MITRAL VALVE               TRICUSPID VALVE  MV Area (PHT): 2.95 cm    TV Peak grad:   34.6 mmHg  MV Decel Time: 257 msec    TV Vmax:        2.94 m/s  MV E velocity: 56.80 cm/s  TR Peak grad:   33.4 mmHg  MV A velocity: 82.10 cm/s  TR Vmax:        289.00 cm/s  MV E/A ratio:  0.69                             SHUNTS                             Systemic VTI:  0.17 m                             Systemic Diam: 2.00 cm   Antimicrobials:  Anti-infectives (From admission, onward)    Start     Dose/Rate Route Frequency Ordered Stop   09/23/20 1800  cefTRIAXone (ROCEPHIN) 2 g in sodium chloride 0.9 % 100 mL IVPB        2 g 200 mL/hr over 30  Minutes Intravenous Every 24 hours 09/22/20 1842 09/30/20 1759   09/23/20 1800  azithromycin (ZITHROMAX) 500 mg in sodium chloride 0.9 % 250 mL IVPB  Status:  Discontinued        500 mg 250 mL/hr over 60 Minutes Intravenous Every 24 hours 09/22/20 1842 09/25/20 1456   09/22/20 1630  cefTRIAXone (ROCEPHIN) 1 g in sodium chloride 0.9 % 100 mL IVPB        1 g 200 mL/hr over 30 Minutes Intravenous  Once 09/22/20 1618 09/22/20 1806   09/22/20 1630  azithromycin (ZITHROMAX) 500 mg in sodium chloride 0.9 % 250 mL IVPB        500 mg 250 mL/hr over 60 Minutes Intravenous  Once 09/22/20 1618 09/22/20 1939        Subjective: Seen and examined and she continues to remain fatigued.  Respiratory is very slowly improving.  Was out of bed yesterday and will be attempting to get out of bed again today.  Finally had a bowel movement.  No chest pain.  She is very slow to improve.  No other concerns or points at this time  Objective: Vitals:   09/28/20 1200 09/28/20 1300 09/28/20 1344 09/28/20 1622  BP: 101/61 118/74    Pulse: 74 94    Resp: (!) 21 (!) 24    Temp:   99.1 F (37.3 C) 97.8 F (36.6 C)  TempSrc:    Oral Oral  SpO2: 92% 92%    Weight:      Height:        Intake/Output Summary (Last 24 hours) at 09/28/2020 1629 Last data filed at 09/28/2020 1300 Gross per 24 hour  Intake 1500 ml  Output 3476 ml  Net -1976 ml    Filed Weights   09/22/20 2020  Weight: 90.9 kg   Examination: Physical Exam:  Constitutional: WN/WD obese Caucasian female currently in slight respiratory distress no longer wearing BiPAP and appears very fatigued Eyes: Lids and conjunctivae normal, sclerae anicteric  ENMT: External Ears, Nose appear normal. Grossly normal hearing. Mucous membranes are moist.  Neck: Appears normal, supple, no cervical masses, normal ROM, no appreciable thyromegaly; no appreciable JVD Respiratory: Diminished to auscultation bilaterally with coarse breath sounds and some crackles, no wheezing, rales, rhonchi or crackles. Normal respiratory effort and patient is not tachypenic.  Wearing supplemental oxygen via nasal cannula Cardiovascular: Irregularly irregular, no murmurs / rubs / gallops. S1 and S2 auscultated.  Has 1+ lower extremity edema Abdomen: Soft, non-tender, distended secondary to body habitus. Bowel sounds positive.  GU: Deferred. Musculoskeletal: No clubbing / cyanosis of digits/nails. No joint deformity upper and lower extremities.  Skin: No rashes, lesions, ulcers on limited skin evaluation. No induration; Warm and dry.  Neurologic: CN 2-12 grossly intact with no focal deficits. Romberg sign and cerebellar reflexes not assessed.  Psychiatric: Normal judgment and insight. Alert and oriented x 3.  Slightly anxious mood and appropriate affect.   Data Reviewed: I have personally reviewed following labs and imaging studies  CBC: Recent Labs  Lab 09/22/20 1441 09/23/20 0207 09/24/20 0323 09/25/20 0447 09/26/20 0303 09/27/20 0311 09/28/20 0311  WBC 21.6* 19.7* 16.5* 18.6* 14.2* 15.1* 19.2*  NEUTROABS 16.7* 16.9*  --   --   --  13.4* 17.3*  HGB 15.6* 15.7* 15.4* 15.4*  15.8* 16.3* 17.6*  HCT 47.4* 49.4* 49.4* 49.7* 51.3* 50.2* 53.8*  MCV 98.3 100.6* 100.8* 102.5* 102.6* 98.2 96.1  PLT 283 253 318 349 329 325 161    Basic Metabolic  Panel: Recent Labs  Lab 09/23/20 0207 09/24/20 0323 09/25/20 0447 09/26/20 0303 09/26/20 0903 09/26/20 1724 09/27/20 0311 09/27/20 1824 09/28/20 0311  NA 139 141 142 142 140 140 145 140 140  K 3.9 3.6 4.6 4.9 4.8 4.4 4.2 3.8 3.8  CL 102 99 104 101 100 95* 96* 84* 88*  CO2 25 30 33* 32 34* 34* 41* 39* 39*  GLUCOSE 141* 187* 178* 168* 185* 167* 175* 198* 152*  BUN 69* 58* 43* 37* 36* 36* 38* 45* 46*  CREATININE 1.04* 0.75 0.68 0.58 0.56 0.65 0.57 0.82 0.70  CALCIUM 8.3* 8.3* 8.4* 8.6* 8.6* 8.9 8.8* 8.8* 8.7*  MG 2.7* 2.8* 2.7* 2.8*  --   --  2.4  --  2.4  PHOS 3.5  --  3.2 3.6  --   --  4.3  --  4.5    GFR: Estimated Creatinine Clearance: 61 mL/min (by C-G formula based on SCr of 0.7 mg/dL). Liver Function Tests: Recent Labs  Lab 09/26/20 0903 09/26/20 1724 09/27/20 0311 09/27/20 1824 09/28/20 0311  AST 50* 47* 35 42* 37  ALT 191* 183* 149* 146* 129*  ALKPHOS 90 92 75 89 78  BILITOT 0.9 0.9 0.7 1.1 0.8  PROT 6.7 7.0 6.4* 7.2 6.5  ALBUMIN 2.9* 2.9* 2.6* 3.0* 2.8*    No results for input(s): LIPASE, AMYLASE in the last 168 hours. No results for input(s): AMMONIA in the last 168 hours. Coagulation Profile: Recent Labs  Lab 09/22/20 2050  INR 1.3*    Cardiac Enzymes: Recent Labs  Lab 09/22/20 2148  CKTOTAL 60    BNP (last 3 results) No results for input(s): PROBNP in the last 8760 hours. HbA1C: No results for input(s): HGBA1C in the last 72 hours. CBG: Recent Labs  Lab 09/27/20 1158  GLUCAP 183*    Lipid Profile: No results for input(s): CHOL, HDL, LDLCALC, TRIG, CHOLHDL, LDLDIRECT in the last 72 hours.  Thyroid Function Tests: No results for input(s): TSH, T4TOTAL, FREET4, T3FREE, THYROIDAB in the last 72 hours.  Anemia Panel: No results for input(s): VITAMINB12, FOLATE,  FERRITIN, TIBC, IRON, RETICCTPCT in the last 72 hours.  Sepsis Labs: Recent Labs  Lab 09/22/20 1441 09/23/20 0207 09/24/20 0323  PROCALCITON 0.48 0.49 0.23  LATICACIDVEN 1.7  --   --      Recent Results (from the past 240 hour(s))  Resp Panel by RT-PCR (Flu A&B, Covid) Nasopharyngeal Swab     Status: None   Collection Time: 09/22/20  2:41 PM   Specimen: Nasopharyngeal Swab; Nasopharyngeal(NP) swabs in vial transport medium  Result Value Ref Range Status   SARS Coronavirus 2 by RT PCR NEGATIVE NEGATIVE Final    Comment: (NOTE) SARS-CoV-2 target nucleic acids are NOT DETECTED.  The SARS-CoV-2 RNA is generally detectable in upper respiratory specimens during the acute phase of infection. The lowest concentration of SARS-CoV-2 viral copies this assay can detect is 138 copies/mL. A negative result does not preclude SARS-Cov-2 infection and should not be used as the sole basis for treatment or other patient management decisions. A negative result may occur with  improper specimen collection/handling, submission of specimen other than nasopharyngeal swab, presence of viral mutation(s) within the areas targeted by this assay, and inadequate number of viral copies(<138 copies/mL). A negative result must be combined with clinical observations, patient history, and epidemiological information. The expected result is Negative.  Fact Sheet for Patients:  EntrepreneurPulse.com.au  Fact Sheet for Healthcare Providers:  IncredibleEmployment.be  This test is no t  yet approved or cleared by the [place] and  has been authorized for detection and/or diagnosis of SARS-CoV-2 by FDA under an Emergency Use Authorization (EUA). This EUA will remain  in effect (meaning this test can be used) for the duration of the COVID-19 declaration under Section 564(b)(1) of the Act, 21 U.S.C.section 360bbb-3(b)(1), unless the authorization is terminated  or  revoked sooner.       Influenza A by PCR NEGATIVE NEGATIVE Final   Influenza B by PCR NEGATIVE NEGATIVE Final    Comment: (NOTE) The Xpert Xpress SARS-CoV-2/FLU/RSV plus assay is intended as an aid in the diagnosis of influenza from Nasopharyngeal swab specimens and should not be used as a sole basis for treatment. Nasal washings and aspirates are unacceptable for Xpert Xpress SARS-CoV-2/FLU/RSV testing.  Fact Sheet for Patients: EntrepreneurPulse.com.au  Fact Sheet for Healthcare Providers: IncredibleEmployment.be  This test is not yet approved or cleared by the [place] FDA and has been authorized for detection and/or diagnosis of SARS-CoV-2 by FDA under an Emergency Use Authorization (EUA). This EUA will remain in effect (meaning this test can be used) for the duration of the COVID-19 declaration under Section 564(b)(1) of the Act, 21 U.S.C. section 360bbb-3(b)(1), unless the authorization is terminated or revoked.  Performed at [facility], [place] [address] [place], [place] [address]   Blood Culture (routine x 2)     Status: None   Collection Time: 09/22/20  2:41 PM   Specimen: BLOOD  Result Value Ref Range Status   Specimen Description   Final    BLOOD BLOOD LEFT FOREARM Performed at [facility] [address], [place], [place] [address]    Special Requests   Final    BOTTLES DRAWN AEROBIC AND ANAEROBIC Blood Culture adequate volume Performed at [facility] [address] [place], [place] [address]    Culture   Final    NO GROWTH 5 DAYS Performed at [facility], [place] [address], [place], [place] [address]    Report Status 09/27/2020 FINAL  Final  Blood Culture (routine x 2)     Status: None   Collection Time: 09/22/20  5:20 PM   Specimen: BLOOD LEFT HAND  Result Value Ref Range Status   Specimen Description   Final    BLOOD LEFT HAND Performed at [facility], [place] [address] [place], [place] [address]    Special Requests   Final    BOTTLES DRAWN AEROBIC AND ANAEROBIC Blood Culture results may not be optimal due to an excessive volume of blood received in culture bottles Performed at [facility] [address], [place], [place] [address]    Culture   Final    NO GROWTH 5 DAYS Performed at [facility], [place] [address] [place], [place] [address]    Report Status 09/27/2020 FINAL  Final  Respiratory (~20 pathogens) panel by PCR     Status: None   Collection Time: 09/22/20 10:47 PM   Specimen: Nasopharyngeal Swab; Respiratory  Result Value Ref Range Status   Adenovirus NOT DETECTED NOT DETECTED Final   Coronavirus 229E NOT DETECTED NOT DETECTED Final    Comment: (NOTE) The Coronavirus on the Respiratory Panel, DOES NOT test for the novel  Coronavirus (2019 nCoV)    Coronavirus HKU1 NOT DETECTED NOT DETECTED Final   Coronavirus NL63 NOT DETECTED NOT DETECTED Final   Coronavirus OC43 NOT DETECTED NOT DETECTED Final  Metapneumovirus NOT DETECTED NOT DETECTED Final   Rhinovirus / Enterovirus NOT DETECTED NOT DETECTED Final   Influenza A NOT DETECTED NOT DETECTED Final   Influenza B NOT DETECTED NOT DETECTED Final   Parainfluenza Virus 1 NOT DETECTED NOT DETECTED Final   Parainfluenza Virus 2 NOT DETECTED NOT DETECTED Final   Parainfluenza Virus 3 NOT DETECTED NOT DETECTED Final   Parainfluenza Virus 4 NOT DETECTED NOT DETECTED Final   Respiratory Syncytial Virus NOT DETECTED NOT DETECTED Final   Bordetella pertussis NOT DETECTED NOT DETECTED Final   Bordetella Parapertussis NOT DETECTED NOT DETECTED Final   Chlamydophila pneumoniae NOT DETECTED NOT DETECTED Final   Mycoplasma pneumoniae NOT DETECTED NOT DETECTED Final    Comment: Performed at [facility], [place] [address], [place], [place] [address]  MRSA Next Gen by PCR, Nasal     Status: None   Collection Time: 09/24/20  5:24 AM   Specimen:  Nasal Mucosa; Nasal Swab  Result Value Ref Range Status   MRSA by PCR Next Gen NOT DETECTED NOT DETECTED Final    Comment: (NOTE) The GeneXpert MRSA Assay (FDA approved for NASAL specimens only), is one component of a comprehensive MRSA colonization surveillance program. It is not intended to diagnose MRSA infection nor to guide or monitor treatment for MRSA infections. Test performance is not FDA approved in patients less than 11 years old. Performed at [facility], [place] [address], [place], [place] [address]   Resp Panel by RT-PCR (Flu A&B, Covid) Nasopharyngeal Swab     Status: None   Collection Time: 09/24/20  2:21 PM   Specimen: Nasopharyngeal Swab; Nasopharyngeal(NP) swabs in vial transport medium  Result Value Ref Range Status   SARS Coronavirus 2 by RT PCR NEGATIVE NEGATIVE Final    Comment: (NOTE) SARS-CoV-2 target nucleic acids are NOT DETECTED.  The SARS-CoV-2 RNA is generally detectable in upper respiratory specimens during the acute phase of infection. The lowest concentration of SARS-CoV-2 viral copies this assay can detect is 138 copies/mL. A negative result does not preclude SARS-Cov-2 infection and should not be used as the sole basis for treatment or other patient management decisions. A negative result may occur with  improper specimen collection/handling, submission of specimen other than nasopharyngeal swab, presence of viral mutation(s) within the areas targeted by this assay, and inadequate number of viral copies(<138 copies/mL). A negative result must be combined with clinical observations, patient history, and epidemiological information. The expected result is Negative.  Fact Sheet for Patients:  EntrepreneurPulse.com.au  Fact Sheet for Healthcare Providers:  IncredibleEmployment.be  This test is no t yet approved or cleared by the [place] FDA and  has been authorized for detection and/or  diagnosis of SARS-CoV-2 by FDA under an Emergency Use Authorization (EUA). This EUA will remain  in effect (meaning this test can be used) for the duration of the COVID-19 declaration under Section 564(b)(1) of the Act, 21 U.S.C.section 360bbb-3(b)(1), unless the authorization is terminated  or revoked sooner.       Influenza A by PCR NEGATIVE NEGATIVE Final   Influenza B by PCR NEGATIVE NEGATIVE Final    Comment: (NOTE) The Xpert Xpress SARS-CoV-2/FLU/RSV plus assay is intended as an aid in the diagnosis of influenza from Nasopharyngeal swab specimens and should not be used as a sole basis for treatment. Nasal washings and aspirates are unacceptable for Xpert Xpress SARS-CoV-2/FLU/RSV testing.  Fact Sheet for Patients: EntrepreneurPulse.com.au  Fact Sheet for Healthcare Providers: IncredibleEmployment.be  This test is  not yet approved or cleared by the [place] and has been authorized for detection and/or diagnosis of SARS-CoV-2 by FDA under an Emergency Use Authorization (EUA). This EUA will remain in effect (meaning this test can be used) for the duration of the COVID-19 declaration under Section 564(b)(1) of the Act, 21 U.S.C. section 360bbb-3(b)(1), unless the authorization is terminated or revoked.  Performed at [facility], [place] [address] [place], [place] [address]       RN Pressure Injury Documentation:     Estimated body mass index is 35.5 kg/m as calculated from the following:   Height as of this encounter: 5' 3"  (1.6 m).   Weight as of this encounter: 90.9 kg.  Malnutrition Type:      Malnutrition Characteristics:      Nutrition Interventions:        Radiology Studies: DG CHEST PORT 1 VIEW  Result Date: 09/27/2020 CLINICAL DATA:  COPD. EXAM: PORTABLE CHEST 1 VIEW COMPARISON:  September 26, 2020. FINDINGS: Stable cardiomegaly is noted. Central pulmonary vascular congestion is again  noted. Stable bibasilar interstitial opacities are noted concerning for pulmonary edema with associated pleural effusions. Bony thorax is unremarkable. IMPRESSION: Stable cardiomegaly with central pulmonary vascular congestion and bibasilar pulmonary edema and associated effusions. Aortic Atherosclerosis (ICD10-I70.0). Electronically Signed   By: Marijo Conception M.D.   On: 09/27/2020 08:13    Scheduled Meds:  apixaban  5 mg Oral BID   arformoterol  15 mcg Nebulization BID   budesonide (PULMICORT) nebulizer solution  0.5 mg Nebulization BID   Chlorhexidine Gluconate Cloth  6 each Topical Daily   diltiazem  120 mg Oral Daily   furosemide  80 mg Intravenous BID   guaiFENesin  600 mg Oral BID   mouth rinse  15 mL Mouth Rinse BID   methylPREDNISolone (SOLU-MEDROL) injection  40 mg Intravenous Q12H   revefenacin  175 mcg Nebulization Daily   Continuous Infusions:  cefTRIAXone (ROCEPHIN)  IV Stopped (09/27/20 1737)    LOS: 6 days   Kerney Elbe, DO [facility] PAGER is on AMION  If 7PM-7AM, please contact night-coverage [email or web address]

## 2020-09-29 ENCOUNTER — Inpatient Hospital Stay (HOSPITAL_COMMUNITY): Payer: Medicare HMO

## 2020-09-29 DIAGNOSIS — R7989 Other specified abnormal findings of blood chemistry: Secondary | ICD-10-CM | POA: Diagnosis not present

## 2020-09-29 DIAGNOSIS — J9621 Acute and chronic respiratory failure with hypoxia: Secondary | ICD-10-CM | POA: Diagnosis not present

## 2020-09-29 DIAGNOSIS — J189 Pneumonia, unspecified organism: Secondary | ICD-10-CM | POA: Diagnosis not present

## 2020-09-29 DIAGNOSIS — J9601 Acute respiratory failure with hypoxia: Secondary | ICD-10-CM | POA: Diagnosis not present

## 2020-09-29 DIAGNOSIS — I517 Cardiomegaly: Secondary | ICD-10-CM | POA: Diagnosis not present

## 2020-09-29 LAB — CBC WITH DIFFERENTIAL/PLATELET
Abs Immature Granulocytes: 0.34 10*3/uL — ABNORMAL HIGH (ref 0.00–0.07)
Basophils Absolute: 0.1 10*3/uL (ref 0.0–0.1)
Basophils Relative: 0 %
Eosinophils Absolute: 0 10*3/uL (ref 0.0–0.5)
Eosinophils Relative: 0 %
HCT: 53.6 % — ABNORMAL HIGH (ref 36.0–46.0)
Hemoglobin: 17.9 g/dL — ABNORMAL HIGH (ref 12.0–15.0)
Immature Granulocytes: 2 %
Lymphocytes Relative: 3 %
Lymphs Abs: 0.5 10*3/uL — ABNORMAL LOW (ref 0.7–4.0)
MCH: 31.9 pg (ref 26.0–34.0)
MCHC: 33.4 g/dL (ref 30.0–36.0)
MCV: 95.4 fL (ref 80.0–100.0)
Monocytes Absolute: 0.9 10*3/uL (ref 0.1–1.0)
Monocytes Relative: 5 %
Neutro Abs: 15.7 10*3/uL — ABNORMAL HIGH (ref 1.7–7.7)
Neutrophils Relative %: 90 %
Platelets: 324 10*3/uL (ref 150–400)
RBC: 5.62 MIL/uL — ABNORMAL HIGH (ref 3.87–5.11)
RDW: 13.4 % (ref 11.5–15.5)
WBC: 17.6 10*3/uL — ABNORMAL HIGH (ref 4.0–10.5)
nRBC: 0 % (ref 0.0–0.2)

## 2020-09-29 LAB — COMPREHENSIVE METABOLIC PANEL
ALT: 112 U/L — ABNORMAL HIGH (ref 0–44)
AST: 40 U/L (ref 15–41)
Albumin: 2.7 g/dL — ABNORMAL LOW (ref 3.5–5.0)
Alkaline Phosphatase: 68 U/L (ref 38–126)
Anion gap: 13 (ref 5–15)
BUN: 51 mg/dL — ABNORMAL HIGH (ref 8–23)
CO2: 41 mmol/L — ABNORMAL HIGH (ref 22–32)
Calcium: 8.6 mg/dL — ABNORMAL LOW (ref 8.9–10.3)
Chloride: 85 mmol/L — ABNORMAL LOW (ref 98–111)
Creatinine, Ser: 0.85 mg/dL (ref 0.44–1.00)
GFR, Estimated: >60 mL/min (ref >60)
Glucose, Bld: 147 mg/dL — ABNORMAL HIGH (ref 70–99)
Potassium: 3.9 mmol/L (ref 3.5–5.1)
Sodium: 139 mmol/L (ref 135–145)
Total Bilirubin: 1 mg/dL (ref 0.3–1.2)
Total Protein: 6 g/dL — ABNORMAL LOW (ref 6.5–8.1)

## 2020-09-29 LAB — PHOSPHORUS: Phosphorus: 4.3 mg/dL (ref 2.5–4.6)

## 2020-09-29 LAB — MAGNESIUM: Magnesium: 2.3 mg/dL (ref 1.7–2.4)

## 2020-09-29 MED ORDER — SIMETHICONE 80 MG PO CHEW
80.0000 mg | CHEWABLE_TABLET | Freq: Four times a day (QID) | ORAL | Status: DC | PRN
  Administered 2020-09-29 – 2020-10-07 (×2): 80 mg via ORAL
  Filled 2020-09-29 (×2): qty 1

## 2020-09-29 MED ORDER — METOPROLOL TARTRATE 5 MG/5ML IV SOLN
5.0000 mg | Freq: Once | INTRAVENOUS | Status: AC
  Administered 2020-09-29: 5 mg via INTRAVENOUS
  Filled 2020-09-29: qty 5

## 2020-09-29 MED ORDER — DILTIAZEM HCL ER 60 MG PO CP12
60.0000 mg | ORAL_CAPSULE | Freq: Once | ORAL | Status: AC
  Administered 2020-09-29: 60 mg via ORAL
  Filled 2020-09-29: qty 1

## 2020-09-29 MED ORDER — DILTIAZEM HCL ER COATED BEADS 180 MG PO CP24
180.0000 mg | ORAL_CAPSULE | Freq: Every day | ORAL | Status: DC
  Administered 2020-09-30 – 2020-10-01 (×2): 180 mg via ORAL
  Filled 2020-09-29 (×2): qty 1

## 2020-09-29 NOTE — Progress Notes (Signed)
NAME:  Kimberly Ballard, MRN:  [id number], DOB:  [birth year], LOS: 7 ADMISSION DATE:  09/22/2020, CONSULTATION DATE:  8/23 REFERRING MD:  Kimberly Ballard, CHIEF COMPLAINT:  Dyspnea, cough   History of Present Illness:  80 y/o femal presented to the [facility] ED with dyspnea, body aches, cough on 8/22 after a significant COVID exposure.  Her daughter had COVID and the patient developed symptoms around 8/16.  She discussed with her PCP and a paxlovid Rx was send on 8/18.  At [facility] she has had 2 negative tests for COVID (both PCR), but her IgG antibody was also negative. Per PCP notes she has received the primary series COVID vaccine as well as 2 boosters.    Pertinent  Medical History  COPD Atrial fibrillation Former cigarette smoker  Significant Hospital Events: Including procedures, antibiotic start and stop dates in addition to other pertinent events   8/22 > admit.  Admitted to stepdown unit.  Cough weak.  Pulmonary asked to evaluate given concern for progressive respiratory failure.  Initial COVID by respiratory panel was negative.  As was influenza.  Respiratory viral panel negative.  Still placed on isolation protocol given history.  IV ceftriaxone and azithromycin initiated.  Given IV Solu-Medrol followed by oral prednisone order. 8/23 pulmonary asked to evaluate given worsening respiratory failure.  CT chest showing lower lobe consolidation.  There was concern about possible aspiration.  Echo obtained showed good left ventricular function with mild pulmonary artery hypertension. RUQ ultrasound showed hepatic steatosis, possibly cirrhosis based on nodular liver contour, no gallbladder 8/24 cardiology consulted for elevated troponins.  This was felt secondary to worsening hypoxia and underlying pulmonary artery hypertension which was felt previously undiagnosed issue.  New onset atrial fibrillation with RVR.  CT angiogram ordered.  Increased oxygen requirement overnight, requiring BiPAP, later placed on 15 L/min  via high flow.  Lower extremity ultrasound negative for DVT 8/25: u strep neg.  still high FIO2. F/u COVID neg. Transitioned to oral CCB and DOAC. Getting IV lasix. Transitioned to headed High flow [facility] during day and BIPAP at night.  8/26 still w/ overall net positive fluid balance over hospital stay. CXR w/ worse edema. Lasix increased to 80 mg q 12. Azithromycin stopped (day 5) Steroids reduced.  Cardiology signed off. (See note from cards w/ plan to follow as out-pt)  8/27 diuresed, CXR better, eating, got up to chair 8/28 FIO2 improved to 80%. Diuresing well Interim History / Subjective:   Unchanged FIO2 and flow rate at 80% and 30L Sat in chair yesterday Good UOP with diuresis  Objective   Blood pressure 109/76, pulse 81, temperature 97.6 F (36.4 C), temperature source Oral, resp. rate 20, height 5\' 3"  (1.6 m), weight 90.9 kg, SpO2 92 %.    FiO2 (%):  [80 %-100 %] 80 %   Intake/Output Summary (Last 24 hours) at 09/29/2020 0801 Last data filed at 09/29/2020 0600 Gross per 24 hour  Intake 540.53 ml  Output 3000 ml  Net -2459.47 ml   Filed Weights   09/22/20 2020  Weight: 90.9 kg   Physical Exam: General: Chronically ill and elderly-appearing, no acute distress HENT: [place], AT, OP clear, MMM, heated high flow nasal cannula in place Eyes: EOMI, no scleral icterus Respiratory: Diminished breath sounds bilaterally.  No crackles, wheezing or rales Cardiovascular: Irregular rate and rhythm, -M/R/G, no JVD GI: BS+, soft, nontender Extremities:-Edema,-tenderness Neuro: AAO x3, CNII-XII grossly intact Psych: Normal mood, normal affect  8/27 CXR images personally reviewed> improved interstitial edema in  lungs bilaterally, basilar consolidation persists Cr stable with diuresis Slightly improving WBC to 17   Resolved Hospital Problem list     Assessment & Plan:  Multifactorial Acute on chronic hypercapnic and hypoxemic respiratory failure with underlying COPD, emphysema seen on CT  chest with atelectasis, pulmonary infiltrates in lower lobes due to community acquired pneumonia.  Initial concern for COVID given high risk exposure, no clear evidence of that based on repeated swab testing.   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 Acute pulmonary edema due to acute decompensated diastolic heart failure Maintain in ICU Out of bed again today Continue BID diuresis Continue steroids at current dosing Follow up serology panel sent on 8/28 AM Continue antibiotics for 7 day course Tolerate periods of hypoxemia, goal at rest is greater than 85% SaO2, with movement ideally above 75% Decision for NIMV should be based on a change in mental status or physical evidence of ventilatory failure such as nasal flaring, accessory muscle use, paradoxical breathing Out of bed to chair as able Incentive spirometry is important, use every hour  COPD, acute exacerbation Brovana/pulmicort/yupelri to continue Xopenex prn Steroids to continue, dosing driven more by infiltrates than wheezing  Demand ischemia 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 Acute decompensated diastolic heart failure Tele Continue diuresis  Atrial fibrillation with RVR Tele Eliquis Diltiazem to continue  Steroid induced hyperglycemia SSI Will taper when respiratory status improves  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  She wants to continue to try to get out of the hospital but she  doesn't want to go on life support or receive CPR. I called her son Kimberly Ballard to discuss and he agreed.]   Labs   CBC: Recent Labs  Lab 09/22/20 1441 09/23/20 0207 09/24/20 0323 09/25/20 0447 09/26/20 0303 09/27/20 0311 09/28/20 0311 09/29/20 0311  WBC 21.6* 19.7*   < > 18.6* 14.2* 15.1* 19.2* 17.6*  NEUTROABS 16.7* 16.9*  --   --   --  13.4* 17.3* 15.7*  HGB 15.6* 15.7*   < > 15.4* 15.8* 16.3* 17.6* 17.9*  HCT 47.4* 49.4*   < > 49.7* 51.3* 50.2* 53.8* 53.6*  MCV 98.3 100.6*   < > 102.5* 102.6* 98.2 96.1 95.4  PLT 283 253   < > 349 329 325 370 324   < > = values in this interval not displayed.    Basic Metabolic Panel: Recent Labs  Lab 09/25/20 0447 09/26/20 0303 09/26/20 0903 09/26/20 1724 09/27/20 0311 09/27/20 1824 09/28/20 0311 09/29/20 0311  NA 142 142   < > 140 145 140 140 139  K 4.6 4.9   < > 4.4 4.2 3.8 3.8 3.9  CL 104 101   < > 95* 96* 84* 88* 85*  CO2 33* 32   < > 34* 41* 39* 39* 41*  GLUCOSE 178* 168*   < > 167* 175* 198* 152* 147*  BUN 43* 37*   < > 36* 38* 45* 46* 51*  CREATININE 0.68 0.58   < > 0.65 0.57 0.82  0.70 0.85  CALCIUM 8.4* 8.6*   < > 8.9 8.8* 8.8* 8.7* 8.6*  MG 2.7* 2.8*  --   --  2.4  --  2.4 2.3  PHOS 3.2 3.6  --   --  4.3  --  4.5 4.3   < > = values in this interval not displayed.   GFR: Estimated Creatinine Clearance: 57.4 mL/min (by C-G formula based on SCr of 0.85 mg/dL). Recent Labs  Lab 09/22/20 1441 09/23/20 0207 09/24/20 0323 09/25/20 0447 09/26/20 0303 09/27/20 0311 09/28/20 0311 09/29/20 0311  PROCALCITON 0.48 0.49 0.23  --   --   --   --   --   WBC 21.6* 19.7* 16.5*   < > 14.2* 15.1* 19.2* 17.6*  LATICACIDVEN 1.7  --   --   --   --   --   --   --    < > = values in this interval not displayed.    Liver Function Tests: Recent Labs  Lab 09/26/20 1724 09/27/20 0311 09/27/20 1824 09/28/20 0311 09/29/20 0311  AST 47* 35 42* 37 40  ALT 183* 149* 146* 129* 112*  ALKPHOS 92 75 89 78 68  BILITOT 0.9 0.7 1.1 0.8 1.0   PROT 7.0 6.4* 7.2 6.5 6.0*  ALBUMIN 2.9* 2.6* 3.0* 2.8* 2.7*   No results for input(s): LIPASE, AMYLASE in the last 168 hours. No results for input(s): AMMONIA in the last 168 hours.  ABG    Component Value Date/Time   PHART 7.325 (L) 09/25/2020 0320   PCO2ART 67.6 (HH) 09/25/2020 0320   PO2ART 130 (H) 09/25/2020 0320   HCO3 34.2 (H) 09/25/2020 0320   O2SAT 98.6 09/25/2020 0320     Coagulation Profile: Recent Labs  Lab 09/22/20 2050  INR 1.3*    Cardiac Enzymes: Recent Labs  Lab 09/22/20 2148  CKTOTAL 60    HbA1C: No results found for: HGBA1C  CBG: Recent Labs  Lab 09/27/20 1158  GLUCAP 183*    Critical care time:    The patient is critically ill with respiratory failure and high risk for deterioration. Independent Critical Care Time: 36  Minutes.   Mechele Collin, M.D. [facility] Pulmonary/Critical Care Medicine 09/29/2020 8:01 AM   Please see Amion for pager number to reach on-call Pulmonary and Critical Care Team.

## 2020-09-29 NOTE — Discharge Instructions (Signed)

## 2020-09-29 NOTE — Progress Notes (Signed)
Physical Therapy Treatment Patient Details Name: Kimberly Ballard MRN: [id number] DOB: 1940-06-28 Today's Date: 09/29/2020    History of Present Illness Patient is a 80 year old female presented with shortness of breath, body aches, decreased appetite and coughing. Per DTR patient diagnosedw ith COVID week previous to admission. PMH includes COPD, Covid negative but on precautions    PT Comments    The patient  remains weak, requiring  HHFNC, HR varying from 90- 140's. SPO2 >88% on 30l,80% HHFNC.Patient's mobility limited by Oxygen requirements. Continue PT for progressive mobility.     Follow Up Recommendations  SNF     Equipment Recommendations  None recommended by PT    Recommendations for Other Services       Precautions / Restrictions Precautions Precautions: Fall Precaution Comments: monitor vitals; currently on HHFNC, 30L, 80 %    Mobility  Bed Mobility               General bed mobility comments: starting to sit up with RN assisting. Patient scoots to bed edge.    Transfers Overall transfer level: Needs assistance Equipment used: 2 person hand held assist Transfers: Sit to/from Stand Sit to Stand: Min assist;+2 physical assistance;+2 safety/equipment         General transfer comment: min A x1-2 to power up to standing, slow to  step to recliner, steady assistance.  Ambulation/Gait                 Stairs             Wheelchair Mobility    Modified Rankin (Stroke Patients Only)       Balance Overall balance assessment: Needs assistance Sitting-balance support: Feet supported;Bilateral upper extremity supported Sitting balance-Leahy Scale: Fair     Standing balance support: Single extremity supported Standing balance-Leahy Scale: Poor Standing balance comment: reliant on external support                            Cognition Arousal/Alertness: Awake/alert Behavior During Therapy: Flat affect Overall Cognitive  Status: Within Functional Limits for tasks assessed                                        Exercises      General Comments        Pertinent Vitals/Pain Pain Assessment: No/denies pain    Home Living                      Prior Function            PT Goals (current goals can now be found in the care plan section) Progress towards PT goals: Progressing toward goals    Frequency    Min 2X/week      PT Plan Discharge plan needs to be updated;Frequency needs to be updated    Co-evaluation              AM-PAC PT "6 Clicks" Mobility   Outcome Measure  Help needed turning from your back to your side while in a flat bed without using bedrails?: A Little Help needed moving from lying on your back to sitting on the side of a flat bed without using bedrails?: A Little Help needed moving to and from a bed to a chair (including a wheelchair)?: A Lot Help needed standing up from  a chair using your arms (e.g., wheelchair or bedside chair)?: A Lot Help needed to walk in hospital room?: A Lot Help needed climbing 3-5 steps with a railing? : Total 6 Click Score: 13    End of Session Equipment Utilized During Treatment: Oxygen Activity Tolerance: Patient limited by fatigue;Treatment limited secondary to medical complications (Comment) Patient left: with nursing/sitter in room (on [facility]) Nurse Communication: Mobility status PT Visit Diagnosis: Unsteadiness on feet (R26.81);Dizziness and giddiness (R42)     Time: 2505-3976 PT Time Calculation (min) (ACUTE ONLY): 20 min  Charges:  $Therapeutic Activity: 8-22 mins                     Blanchard Kelch PT Acute Rehabilitation Services Pager [phone] Office [phone]    [name] 09/29/2020, 1:11 PM

## 2020-09-29 NOTE — TOC Progression Note (Signed)
Transition of Care [facility]) - Progression Note    Patient Details  Name: Kimberly Ballard MRN: [id number] Date of Birth: 1940-02-20  Transition of Care [facility]) CM/SW Contact  Golda Acre, RN Phone Number: 09/29/2020, 9:00 AM  Clinical Narrative:    Significant Hospital Events: Including procedures, antibiotic start and stop dates in addition to other pertinent events   8/22 > admit.  Admitted to stepdown unit.  Cough weak.  Pulmonary asked to evaluate given concern for progressive respiratory failure.  Initial COVID by respiratory panel was negative.  As was influenza.  Respiratory viral panel negative.  Still placed on isolation protocol given history.  IV ceftriaxone and azithromycin initiated.  Given IV Solu-Medrol followed by oral prednisone order. 8/23 pulmonary asked to evaluate given worsening respiratory failure.  CT chest showing lower lobe consolidation.  There was concern about possible aspiration.  Echo obtained showed good left ventricular function with mild pulmonary artery hypertension. RUQ ultrasound showed hepatic steatosis, possibly cirrhosis based on nodular liver contour, no gallbladder 8/24 cardiology consulted for elevated troponins.  This was felt secondary to worsening hypoxia and underlying pulmonary artery hypertension which was felt previously undiagnosed issue.  New onset atrial fibrillation with RVR.  CT angiogram ordered.  Increased oxygen requirement overnight, requiring BiPAP, later placed on 15 L/min via high flow.  Lower extremity ultrasound negative for DVT 8/25: u strep neg.  still high FIO2. F/u COVID neg. Transitioned to oral CCB and DOAC. Getting IV lasix. Transitioned to headed High flow [facility] during day and BIPAP at night.  8/26 still w/ overall net positive fluid balance over hospital stay. CXR w/ worse edema. Lasix increased to 80 mg q 12. Azithromycin stopped (day 5) Steroids reduced.  Cardiology signed off. (See note from cards w/ plan to follow as out-pt)   8/27 diuresed, CXR better, eating, got up to chair 8/28 FIO2 improved to 80%. Diuresing well TOC PLAN OF CARE: has bed offers at several snf's will follow for dc and progression.  Will discuss snf placement offers with family and patient.  Expected Discharge Plan: Home/Self Care Barriers to Discharge: Continued Medical Work up  Expected Discharge Plan and Services Expected Discharge Plan: Home/Self Care   Discharge Planning Services: CM Consult   Living arrangements for the past 2 months: Single Family Home                                       Social Determinants of Health (SDOH) Interventions    Readmission Risk Interventions No flowsheet data found.

## 2020-09-29 NOTE — Progress Notes (Signed)
PROGRESS NOTE    Kimberly Ballard  TDV:[id number] DOB: Jun 03, 1940 DOA: 09/22/2020 PCP: System, Provider Not In  Brief Narrative:  The patient is 80 year old female with history of COPD presented with shortness of breath, body aches, decreased appetite and coughing.  Patients daughter was diagnosed with COVID last week.  She was started on Paxilovid empirically due to risk of exposure.  Patient continued to get worse and developed shortness of breath.  On EMS arrival she required 6 L/min of oxygen.  In the ED SARS-CoV-2 RT-PCR was negative.  Chest x-ray showed bilateral infiltrates, CTA was negative for PE but showed evidence of bilateral infiltrates.  Patient started on Rocephin and Zithromax for community-acquired pneumonia.  Subsequently she decompensated and worsened and went into A. fib with RVR and had to be placed on BiPAP and she is subsequently moved to the stepdown unit.  We are empirically treating for COVID and pulmonary has been consulted as well as cardiology.  Cardiology started the patient on Cardizem drip and pulmonary recommending continuing steroids.  She is weaned to 15 L nonrebreather and remains in A. fib without RVR so her Cardizem is being changed to 180 mg p.o. daily and changing heparin to Eliquis and discontinue aspirin.  Cardiology recommends continue Lasix but now the dose is changed to IV Lasix twice daily 80 mg.  She was given IV Lasix 40 mg yesterday.  Cardiology feels that she has some diastolic CHF in the setting of A. fib with RVR and they are planning on repeating echo in 6 weeks.  Cardiology recommends continuing Cardizem 120 mg daily and Eliquis 5 mg twice daily.    Respiratory status is slowly improving and after goals of care discussion with pulmonary she was made DNR.  We are recommending continue to get out of bed with assistance and recommending continue diuresis and steroids at current dosing.  We will need to follow-up with nephrology panel patent and pulmonary now  recommends tolerating periods of hypoxemia with a go at rest greater than 85% and ideally would 75%  09/29/2020 her heart rates were uncontrolled and she went back in A. fib with RVR.  She was given a dose of IV metoprolol and her po  Cardizem was increased.  Her respiratory status remains tenuous so we will continue diuresis and continue steroids.  PT OT recommending SNF  Assessment & Plan:   Active Problems:   CAP (community acquired pneumonia)   Acute respiratory failure with hypoxia and hypercapnia (HCC)   Sepsis ([place])   Elevated LFTs   Cardiomegaly   Elevated troponin   Atrial fibrillation (HCC)   Acute and chronic respiratory failure (acute-on-chronic) (HCC)  Acute respiratory failure with hypoxia requiring noninvasive positive pressure ventilation with BiPAP 12/6 60% FiO2 -Multifactorial from underlying multifocal pneumonia, diastolic CHF -Patient is currently on Rocephin and Zithromax and will continue -Follow blood culture results -She is given IV Lasix 40 mg daily for last 3 days but now she will be getting IV 80 mg twice daily per pulmonary recommendations -CTA of the Chest PE protocol done and showed "No evidence of acute pulmonary embolism. Patchy consolidative opacities in the lung bases and  lingula have slightly worsened compared to the prior study and remain concerning for multifocal pneumonia, with differential including COVID. As before, follow-up chest CT in 3 months is recommended to assess for resolution. Unchanged cardiomegaly with evidence of right heart failure and pulmonary hypertension as above. Coronary artery calcifications and Aortic Atherosclerosis (ICD10-I70.0). -SpO2: 93 % O2  Flow Rate (L/min): 30 L/min FiO2 (%): 80 %; was on 15 L nonrebreather and now oxygen requirement is improving -Inflammatory Markers No results for input(s): DDIMER, FERRITIN, LDH, CRP in the last 72 hours. -ABG    Component Value Date/Time   PHART 7.325 (L) 09/25/2020 0320    PCO2ART 67.6 (HH) 09/25/2020 0320   PO2ART 130 (H) 09/25/2020 0320   HCO3 34.2 (H) 09/25/2020 0320   O2SAT 98.6 09/25/2020 0320      Lab Results  Component Value Date   SARSCOV2NAA NEGATIVE 09/24/2020   [place] NEGATIVE 09/22/2020   -Leukocytosis was resolving but now slightly worsened in the setting of steroid demargination and is now improving again.  WBC is now gone from 19.2 is now 17.6 -Speech therapy consulted for swallow evaluation -Appreciate PCCM consult and they placed the patient on BiPAP but has now been weaned to 15 L; pulmonary feels that this is clinically COVID given that hypoxemia and hypercapnia however now her isolation precautions have been discontinued -Pulmonary recommends continuing BiPAP nightly and 2 hours twice a day -They are recommending nebs with Brovana/Pulmicort/Yupelri as below -Steroids are being completed however pulmonary recommends continue steroids for now and she will be continued on Solu-Medrol 40 mg every 12 -Continuing Xopenex 1.25 mg nebs every 4 hours for wheezing shortness of breath as well as revefenacin 175 mcg nebulized daily and budesonide 0.5 mg neb twice daily -She remains on IV ceftriaxone and azithromycin has now been discontinued. -Continue with IV Lasix per pulmonary recommendations with IV Lasix 80 mg twice daily; she is -10.155 L -Clear liquid diet was initiated yesterday but now is advance to oral diet -Pulmonary recommends continue diuresis -CXR yesterday showed "Stable cardiomegaly with central pulmonary vascular congestion and bibasilar pulmonary edema and associated effusions. Aortic Atherosclerosis."  -Pulmonary also recommends maintaining ICU today mobilizing getting up and out of bed; pulmonary is also checking serology for looking for systemic problems that can lead to inflammatory lung disease and serology panel has been sent and pending -Repeat chest x-ray in a.m.  A. fib with RVR; now RVR is improving -Heart rates  were ranging in the mid 100s and now in the 90s -She was placed on a Cardizem drip and now cardiology had changed to 180 mg p.o. Cardizem -Currently on anticoagulation with IV heparin given her CHA2DS2-VASc score of 3 but will now be changed to p.o. Eliquis by cardiology -Continue to monitor on telemetry and cardiology recommends decreasing the Cardizem to 120 mg daily and continuing p.o. Eliquis  AKI -Resolving; BUN/Cr is now 51/0.85 -Patient's BUNs/creatinine was elevated to 69/1.28 -Avoid nephrotoxic medications, contrast dyes, hypotension and renally dose medications -Continue monitor and trend renal function and repeat CMP in a.m.   Acute Diastolic CHF/right ventricular function/pulmonary hypertension -Echocardiogram is currently pending -She has bilateral crackles on auscultation -Cardiology recommends continuing diuresis with IV Lasix and now Pulmonary is diuresing with IV Lasix 80 mg BID -BNP was elevated but cardiology feels that this may be underlying related to respiratory issues -Strict I's and O's and daily weights and she -10.1 L as above -Given her history of COPD with acute exacerbation multifocal pneumonia, cardiology feels that the hypoxemia is resulting in acute right ventricular strain with right ventricular dilatation and right ventricular dysfunction -Patient has moderate pulm hypertension on echo related to underlying COPD with acute exacerbation in the setting of hypoxemia -Repeat CTA PE as above -BNP was elevated at 889.7 and worsened and BNP was elevated to 964.7 -Cardiology has been consulted  and is now signed off on diuresis and now managed by pulmonary   Elevated Troponin -Likely demand ischemia -hSTrop 12 -> 100 and then trended down to 52 -EKG shows T wave abnormalities in anterior chest leads -Cardiology has been consulted and have now signed off the case   Abnormal LFTs/Transaminitis -Unclear etiology -?  Passive liver congestion from CHF -Total  bilirubin is normal, alk phos is normal -LFTs on admission showed an AST of 503 and ALT of 439; now improving as AST is now 37 and ALT is 129  Constipation -Continue bowel regimen with senna docusate 1 tab p.o. nightly as needed for mild constipation we will add MiraLAX 17 g p.o. twice daily  Right middle lobe nodule -Will need repeat CT in 3 months after treatment of pneumonia   Obesity -Complicates overall prognosis and care -Estimated body mass index is 35.5 kg/m as calculated from the following:   Height as of this encounter: 5' 3"  (1.6 m).   Weight as of this encounter: 90.9 kg. -Weight Loss and Dietary Counseling   DVT prophylaxis: Anticoagulated with heparin drip Code Status: FULL CODE  Family Communication: No family currently at bedside Disposition Plan: Continue to monitor in the stepdown unit  Status is: Inpatient  Remains inpatient appropriate because:Unsafe d/c plan, IV treatments appropriate due to intensity of illness or inability to take PO, and Inpatient level of care appropriate due to severity of illness  Dispo: The patient is from: Home              Anticipated d/c is to:  TBD              Patient currently is not medically stable to d/c.   Difficult to place patient No   Consultants:  Pulmonary/PCCM Cardiology  Procedures:  ECHOCARDIOGRAM IMPRESSIONS     1. Left ventricular ejection fraction, by estimation, is 55 to 60%. Left  ventricular ejection fraction by 3D volume is 59 %. The left ventricle has  normal function. The left ventricle has no regional wall motion  abnormalities. There is moderate left  ventricular hypertrophy. Left ventricular diastolic parameters are  consistent with Grade I diastolic dysfunction (impaired relaxation).   2. Right ventricular systolic function mildly reduced at base and mid,  with relative preserved apical systolic function. The right ventricular  size is moderately enlarged. There is moderately elevated  pulmonary artery  systolic pressure. The estimated  right ventricular systolic pressure is 64.6 mmHg.   3. Left atrial size was mildly dilated.   4. Right atrial size was moderately dilated.   5. The mitral valve is normal in structure. Trivial mitral valve  regurgitation. No evidence of mitral stenosis.   6. Tricuspid valve regurgitation is mild to moderate.   7. The aortic valve is normal in structure. Aortic valve regurgitation is  not visualized. No aortic stenosis is present.   8. The inferior vena cava is dilated in size with <50% respiratory  variability, suggesting right atrial pressure of 15 mmHg.   FINDINGS   Left Ventricle: Left ventricular ejection fraction, by estimation, is 55  to 60%. Left ventricular ejection fraction by 3D volume is 59 %. The left  ventricle has normal function. The left ventricle has no regional wall  motion abnormalities. The left  ventricular internal cavity size was normal in size. There is moderate  left ventricular hypertrophy. Left ventricular diastolic parameters are  consistent with Grade I diastolic dysfunction (impaired relaxation).   Right Ventricle: Calcified moderate band. The  right ventricular size is  moderately enlarged. No increase in right ventricular wall thickness.  Right ventricular systolic function mildly reduced at base and mid, with  relative preserved apical systolic  function. There is moderately elevated pulmonary artery systolic pressure.  The tricuspid regurgitant velocity is 2.89 m/s, and with an assumed right  atrial pressure of 15 mmHg, the estimated right ventricular systolic  pressure is 07.8 mmHg.   Left Atrium: Left atrial size was mildly dilated.   Right Atrium: Right atrial size was moderately dilated.   Pericardium: There is no evidence of pericardial effusion. Presence of  pericardial fat pad.   Mitral Valve: The mitral valve is normal in structure. Mild mitral annular  calcification. Trivial mitral  valve regurgitation. No evidence of mitral  valve stenosis.   Tricuspid Valve: The tricuspid valve is normal in structure. Tricuspid  valve regurgitation is mild to moderate. No evidence of tricuspid  stenosis.   Aortic Valve: The aortic valve is normal in structure. Aortic valve  regurgitation is not visualized. No aortic stenosis is present.   Pulmonic Valve: The pulmonic valve was normal in structure. Pulmonic valve  regurgitation is trivial. No evidence of pulmonic stenosis.   Aorta: Aorta dimension upper limit of normal for age and BSA. The aortic  root is normal in size and structure.   Venous: The inferior vena cava is dilated in size with less than 50%  respiratory variability, suggesting right atrial pressure of 15 mmHg.   IAS/Shunts: The interatrial septum appears to be lipomatous. No atrial  level shunt detected by color flow Doppler.      LEFT VENTRICLE  PLAX 2D  LVIDd:         3.60 cm         Diastology  LVIDs:         2.50 cm         LV e' medial:    6.31 cm/s  LV PW:         1.30 cm         LV E/e' medial:  9.0  LV IVS:        1.30 cm         LV e' lateral:   10.20 cm/s  LVOT diam:     2.00 cm         LV E/e' lateral: 5.6  LV SV:         54  LV SV Index:   28  LVOT Area:     3.14 cm        3D Volume EF                                 LV 3D EF:    Left                                              ventricul  LV Volumes (MOD)                            ar  LV vol d, MOD    73.9 ml                    ejection  A2C:  fraction  LV vol d, MOD    53.3 ml                    by 3D  A4C:                                        volume is  LV vol s, MOD    28.2 ml                    59 %.  A2C:  LV vol s, MOD    20.1 ml  A4C:                           3D Volume EF:  LV SV MOD A2C:   45.7 ml       3D EF:        59 %  LV SV MOD A4C:   53.3 ml  LV SV MOD BP:    38.1 ml   RIGHT VENTRICLE             IVC  RV S prime:     10.60 cm/s   IVC diam: 2.90 cm  TAPSE (M-mode): 1.8 cm   LEFT ATRIUM             Index       RIGHT ATRIUM           Index  LA diam:        3.90 cm 2.02 cm/m  RA Area:     25.80 cm  LA Vol (A2C):   33.2 ml 17.16 ml/m RA Volume:   96.90 ml  50.07 ml/m  LA Vol (A4C):   54.8 ml 28.32 ml/m  LA Biplane Vol: 43.4 ml 22.43 ml/m   AORTIC VALVE             PULMONIC VALVE  LVOT Vmax:   95.70 cm/s  PR End Diast Vel: 1.35 msec  LVOT Vmean:  61.300 cm/s  LVOT VTI:    0.172 m     AORTA  Ao Root diam: 3.70 cm  Ao Asc diam:  3.80 cm   MITRAL VALVE               TRICUSPID VALVE  MV Area (PHT): 2.95 cm    TV Peak grad:   34.6 mmHg  MV Decel Time: 257 msec    TV Vmax:        2.94 m/s  MV E velocity: 56.80 cm/s  TR Peak grad:   33.4 mmHg  MV A velocity: 82.10 cm/s  TR Vmax:        289.00 cm/s  MV E/A ratio:  0.69                             SHUNTS                             Systemic VTI:  0.17 m                             Systemic Diam: 2.00 cm   Antimicrobials:  Anti-infectives (From admission, onward)    Start     Dose/Rate Route Frequency Ordered  Stop   09/23/20 1800  cefTRIAXone (ROCEPHIN) 2 g in sodium chloride 0.9 % 100 mL IVPB        2 g 200 mL/hr over 30 Minutes Intravenous Every 24 hours 09/22/20 1842 09/29/20 1749   09/23/20 1800  azithromycin (ZITHROMAX) 500 mg in sodium chloride 0.9 % 250 mL IVPB  Status:  Discontinued        500 mg 250 mL/hr over 60 Minutes Intravenous Every 24 hours 09/22/20 1842 09/25/20 1456   09/22/20 1630  cefTRIAXone (ROCEPHIN) 1 g in sodium chloride 0.9 % 100 mL IVPB        1 g 200 mL/hr over 30 Minutes Intravenous  Once 09/22/20 1618 09/22/20 1806   09/22/20 1630  azithromycin (ZITHROMAX) 500 mg in sodium chloride 0.9 % 250 mL IVPB        500 mg 250 mL/hr over 60 Minutes Intravenous  Once 09/22/20 1618 09/22/20 1939        Subjective: Seen and examined and she is feeling okay today and still complain of shortness of breath.  Continues remain fatigue and  her heart rate now started maintaining and was elevated.  Was up and out of bed and sitting in a chair this morning.  No other concerns or complaints at this time and LFTs are improving.  Objective: Vitals:   09/29/20 1700 09/29/20 1845 09/29/20 1951 09/29/20 1956  BP: (!) 110/59  126/69   Pulse: 95  87   Resp: (!) 23  (!) 27   Temp: 98.1 F (36.7 C) (!) 96.5 F (35.8 C)    TempSrc: Oral Axillary    SpO2: 90%  92% 93%  Weight:      Height:        Intake/Output Summary (Last 24 hours) at 09/29/2020 1959 Last data filed at 09/29/2020 1719 Gross per 24 hour  Intake 560 ml  Output 2550 ml  Net -1990 ml    Filed Weights   09/22/20 2020  Weight: 90.9 kg   Examination: Physical Exam:  Constitutional: WN/WD obese chronically ill-appearing Caucasian female currently in mild respiratory distress no longer wearing BiPAP appears very fatigued and calm sitting in the chair bedside.   Eyes: Lids and conjunctivae normal, sclerae anicteric  ENMT: External Ears, Nose appear normal. Grossly normal hearing. Mucous membranes are moist.  Neck: Appears normal, supple, no cervical masses, normal ROM, no appreciable thyromegaly; no JVD Respiratory: Diminished to auscultation bilaterally, no wheezing, rales, rhonchi or crackles. Normal respiratory effort and patient is not tachypenic. No accessory muscle use.  Wearing heated high flow supplemental oxygen via nasal cannula Cardiovascular: Irregularly irregular and she is now tachycardic, no murmurs / rubs / gallops. S1 and S2 auscultated.  Has 1+ lower extremity edema Abdomen: Soft, non-tender, distended secondary body habitus.  Bowel sounds positive.  GU: Deferred. Musculoskeletal: No clubbing / cyanosis of digits/nails. No joint deformity upper and lower extremities. Skin: No rashes, lesions, ulcers. No induration; Warm and dry.  Neurologic: CN 2-12 grossly intact with no focal deficits.  Romberg sign cerebellar reflexes not assessed.  Psychiatric:  Normal judgment and insight. Alert and oriented x 3.  A little anxious mood and appropriate affect.   Data Reviewed: I have personally reviewed following labs and imaging studies  CBC: Recent Labs  Lab 09/23/20 0207 09/24/20 0323 09/25/20 0447 09/26/20 0303 09/27/20 0311 09/28/20 0311 09/29/20 0311  WBC 19.7*   < > 18.6* 14.2* 15.1* 19.2* 17.6*  NEUTROABS 16.9*  --   --   --  13.4* 17.3* 15.7*  HGB 15.7*   < > 15.4* 15.8* 16.3* 17.6* 17.9*  HCT 49.4*   < > 49.7* 51.3* 50.2* 53.8* 53.6*  MCV 100.6*   < > 102.5* 102.6* 98.2 96.1 95.4  PLT 253   < > 349 329 325 370 324   < > = values in this interval not displayed.    Basic Metabolic Panel: Recent Labs  Lab 09/25/20 0447 09/26/20 0303 09/26/20 0903 09/26/20 1724 09/27/20 0311 09/27/20 1824 09/28/20 0311 09/29/20 0311  NA 142 142   < > 140 145 140 140 139  K 4.6 4.9   < > 4.4 4.2 3.8 3.8 3.9  CL 104 101   < > 95* 96* 84* 88* 85*  CO2 33* 32   < > 34* 41* 39* 39* 41*  GLUCOSE 178* 168*   < > 167* 175* 198* 152* 147*  BUN 43* 37*   < > 36* 38* 45* 46* 51*  CREATININE 0.68 0.58   < > 0.65 0.57 0.82 0.70 0.85  CALCIUM 8.4* 8.6*   < > 8.9 8.8* 8.8* 8.7* 8.6*  MG 2.7* 2.8*  --   --  2.4  --  2.4 2.3  PHOS 3.2 3.6  --   --  4.3  --  4.5 4.3   < > = values in this interval not displayed.    GFR: Estimated Creatinine Clearance: 57.4 mL/min (by C-G formula based on SCr of 0.85 mg/dL). Liver Function Tests: Recent Labs  Lab 09/26/20 1724 09/27/20 0311 09/27/20 1824 09/28/20 0311 09/29/20 0311  AST 47* 35 42* 37 40  ALT 183* 149* 146* 129* 112*  ALKPHOS 92 75 89 78 68  BILITOT 0.9 0.7 1.1 0.8 1.0  PROT 7.0 6.4* 7.2 6.5 6.0*  ALBUMIN 2.9* 2.6* 3.0* 2.8* 2.7*    No results for input(s): LIPASE, AMYLASE in the last 168 hours. No results for input(s): AMMONIA in the last 168 hours. Coagulation Profile: Recent Labs  Lab 09/22/20 2050  INR 1.3*    Cardiac Enzymes: Recent Labs  Lab 09/22/20 2148  CKTOTAL 60     BNP (last 3 results) No results for input(s): PROBNP in the last 8760 hours. HbA1C: No results for input(s): HGBA1C in the last 72 hours. CBG: Recent Labs  Lab 09/27/20 1158  GLUCAP 183*    Lipid Profile: No results for input(s): CHOL, HDL, LDLCALC, TRIG, CHOLHDL, LDLDIRECT in the last 72 hours.  Thyroid Function Tests: No results for input(s): TSH, T4TOTAL, FREET4, T3FREE, THYROIDAB in the last 72 hours.  Anemia Panel: No results for input(s): VITAMINB12, FOLATE, FERRITIN, TIBC, IRON, RETICCTPCT in the last 72 hours.  Sepsis Labs: Recent Labs  Lab 09/23/20 0207 09/24/20 0323  PROCALCITON 0.49 0.23    Recent Results (from the past 240 hour(s))  Resp Panel by RT-PCR (Flu A&B, Covid) Nasopharyngeal Swab     Status: None   Collection Time: 09/22/20  2:41 PM   Specimen: Nasopharyngeal Swab; Nasopharyngeal(NP) swabs in vial transport medium  Result Value Ref Range Status   SARS Coronavirus 2 by RT PCR NEGATIVE NEGATIVE Final    Comment: (NOTE) SARS-CoV-2 target nucleic acids are NOT DETECTED.  The SARS-CoV-2 RNA is generally detectable in upper respiratory specimens during the acute phase of infection. The lowest concentration of SARS-CoV-2 viral copies this assay can detect is 138 copies/mL. A negative result does not preclude SARS-Cov-2 infection and should not be used as the sole basis for treatment or other patient management decisions.  A negative result may occur with  improper specimen collection/handling, submission of specimen other than nasopharyngeal swab, presence of viral mutation(s) within the areas targeted by this assay, and inadequate number of viral copies(<138 copies/mL). A negative result must be combined with clinical observations, patient history, and epidemiological information. The expected result is Negative.  Fact Sheet for Patients:  EntrepreneurPulse.com.au  Fact Sheet for Healthcare Providers:   IncredibleEmployment.be  This test is no t yet approved or cleared by the [place] FDA and  has been authorized for detection and/or diagnosis of SARS-CoV-2 by FDA under an Emergency Use Authorization (EUA). This EUA will remain  in effect (meaning this test can be used) for the duration of the COVID-19 declaration under Section 564(b)(1) of the Act, 21 U.S.C.section 360bbb-3(b)(1), unless the authorization is terminated  or revoked sooner.       Influenza A by PCR NEGATIVE NEGATIVE Final   Influenza B by PCR NEGATIVE NEGATIVE Final    Comment: (NOTE) The Xpert Xpress SARS-CoV-2/FLU/RSV plus assay is intended as an aid in the diagnosis of influenza from Nasopharyngeal swab specimens and should not be used as a sole basis for treatment. Nasal washings and aspirates are unacceptable for Xpert Xpress SARS-CoV-2/FLU/RSV testing.  Fact Sheet for Patients: EntrepreneurPulse.com.au  Fact Sheet for Healthcare Providers: IncredibleEmployment.be  This test is not yet approved or cleared by the [place] FDA and has been authorized for detection and/or diagnosis of SARS-CoV-2 by FDA under an Emergency Use Authorization (EUA). This EUA will remain in effect (meaning this test can be used) for the duration of the COVID-19 declaration under Section 564(b)(1) of the Act, 21 U.S.C. section 360bbb-3(b)(1), unless the authorization is terminated or revoked.  Performed at [facility], [place] [address] [place], [place] [address]   Blood Culture (routine x 2)     Status: None   Collection Time: 09/22/20  2:41 PM   Specimen: BLOOD  Result Value Ref Range Status   Specimen Description   Final    BLOOD BLOOD LEFT FOREARM Performed at [facility] [address], [place], [place] [address]    Special Requests   Final    BOTTLES DRAWN AEROBIC AND ANAEROBIC Blood Culture adequate  volume Performed at [facility] [address], [place], [place] [address]    Culture   Final    NO GROWTH 5 DAYS Performed at [facility], [place] [address], [place], [place] [address]    Report Status 09/27/2020 FINAL  Final  Blood Culture (routine x 2)     Status: None   Collection Time: 09/22/20  5:20 PM   Specimen: BLOOD LEFT HAND  Result Value Ref Range Status   Specimen Description   Final    BLOOD LEFT HAND Performed at [facility], [place] [address], [place], [place] [address]    Special Requests   Final    BOTTLES DRAWN AEROBIC AND ANAEROBIC Blood Culture results may not be optimal due to an excessive volume of blood received in culture bottles Performed at [facility] [address], [place], [place] [address]    Culture   Final    NO GROWTH 5 DAYS Performed at [facility], [employer] [address] [place], [place] [address]    Report Status 09/27/2020 FINAL  Final  Respiratory (~20 pathogens) panel by PCR     Status: None   Collection Time: 09/22/20 10:47 PM   Specimen: Nasopharyngeal Swab;  Respiratory  Result Value Ref Range Status   Adenovirus NOT DETECTED NOT DETECTED Final   Coronavirus 229E NOT DETECTED NOT DETECTED Final    Comment: (NOTE) The Coronavirus on the Respiratory Panel, DOES NOT test for the novel  Coronavirus (2019 nCoV)    Coronavirus HKU1 NOT DETECTED NOT DETECTED Final   Coronavirus NL63 NOT DETECTED NOT DETECTED Final   Coronavirus OC43 NOT DETECTED NOT DETECTED Final   Metapneumovirus NOT DETECTED NOT DETECTED Final   Rhinovirus / Enterovirus NOT DETECTED NOT DETECTED Final   Influenza A NOT DETECTED NOT DETECTED Final   Influenza B NOT DETECTED NOT DETECTED Final   Parainfluenza Virus 1 NOT DETECTED NOT DETECTED Final   Parainfluenza Virus 2 NOT DETECTED NOT DETECTED Final   Parainfluenza Virus 3 NOT DETECTED NOT DETECTED Final   Parainfluenza Virus 4 NOT DETECTED NOT DETECTED Final    Respiratory Syncytial Virus NOT DETECTED NOT DETECTED Final   Bordetella pertussis NOT DETECTED NOT DETECTED Final   Bordetella Parapertussis NOT DETECTED NOT DETECTED Final   Chlamydophila pneumoniae NOT DETECTED NOT DETECTED Final   Mycoplasma pneumoniae NOT DETECTED NOT DETECTED Final    Comment: Performed at [facility], [place]. [address], [place], [place] [address]  MRSA Next Gen by PCR, Nasal     Status: None   Collection Time: 09/24/20  5:24 AM   Specimen: Nasal Mucosa; Nasal Swab  Result Value Ref Range Status   MRSA by PCR Next Gen NOT DETECTED NOT DETECTED Final    Comment: (NOTE) The GeneXpert MRSA Assay (FDA approved for NASAL specimens only), is one component of a comprehensive MRSA colonization surveillance program. It is not intended to diagnose MRSA infection nor to guide or monitor treatment for MRSA infections. Test performance is not FDA approved in patients less than 22 years old. Performed at [facility], [place] [address], [place], [place] [address]   Resp Panel by RT-PCR (Flu A&B, Covid) Nasopharyngeal Swab     Status: None   Collection Time: 09/24/20  2:21 PM   Specimen: Nasopharyngeal Swab; Nasopharyngeal(NP) swabs in vial transport medium  Result Value Ref Range Status   SARS Coronavirus 2 by RT PCR NEGATIVE NEGATIVE Final    Comment: (NOTE) SARS-CoV-2 target nucleic acids are NOT DETECTED.  The SARS-CoV-2 RNA is generally detectable in upper respiratory specimens during the acute phase of infection. The lowest concentration of SARS-CoV-2 viral copies this assay can detect is 138 copies/mL. A negative result does not preclude SARS-Cov-2 infection and should not be used as the sole basis for treatment or other patient management decisions. A negative result may occur with  improper specimen collection/handling, submission of specimen other than nasopharyngeal swab, presence of viral mutation(s) within the areas targeted by this assay,  and inadequate number of viral copies(<138 copies/mL). A negative result must be combined with clinical observations, patient history, and epidemiological information. The expected result is Negative.  Fact Sheet for Patients:  EntrepreneurPulse.com.au  Fact Sheet for Healthcare Providers:  IncredibleEmployment.be  This test is no t yet approved or cleared by the [place] FDA and  has been authorized for detection and/or diagnosis of SARS-CoV-2 by FDA under an Emergency Use Authorization (EUA). This EUA will remain  in effect (meaning this test can be used) for the duration of the COVID-19 declaration under Section 564(b)(1) of the Act, 21 U.S.C.section 360bbb-3(b)(1), unless the authorization is terminated  or revoked sooner.       Influenza A by PCR NEGATIVE  NEGATIVE Final   Influenza B by PCR NEGATIVE NEGATIVE Final    Comment: (NOTE) The Xpert Xpress SARS-CoV-2/FLU/RSV plus assay is intended as an aid in the diagnosis of influenza from Nasopharyngeal swab specimens and should not be used as a sole basis for treatment. Nasal washings and aspirates are unacceptable for Xpert Xpress SARS-CoV-2/FLU/RSV testing.  Fact Sheet for Patients: EntrepreneurPulse.com.au  Fact Sheet for Healthcare Providers: IncredibleEmployment.be  This test is not yet approved or cleared by the [place] FDA and has been authorized for detection and/or diagnosis of SARS-CoV-2 by FDA under an Emergency Use Authorization (EUA). This EUA will remain in effect (meaning this test can be used) for the duration of the COVID-19 declaration under Section 564(b)(1) of the Act, 21 U.S.C. section 360bbb-3(b)(1), unless the authorization is terminated or revoked.  Performed at [facility], [place] [address], [place], [place] [address]     RN Pressure Injury Documentation:     Estimated body mass index is 35.5  kg/m as calculated from the following:   Height as of this encounter: 5' 3"  (1.6 m).   Weight as of this encounter: 90.9 kg.  Malnutrition Type:  Malnutrition Characteristics:   Nutrition Interventions:     Radiology Studies: DG CHEST PORT 1 VIEW  Result Date: 09/29/2020 CLINICAL DATA:  Follow-up cardiomegaly with vascular congestion, pulmonary edema, and effusions EXAM: PORTABLE CHEST 1 VIEW COMPARISON:  Chest radiograph 09/27/2020 FINDINGS: The heart size is stable. The mediastinal contours are unchanged, allowing for leftward patient rotation. A left pleural effusion with dense left basilar consolidation is similar to the prior study. Aeration of the right base has improved. Increased interstitial markings bilaterally are similar to the prior study consistent with pulmonary interstitial edema. There is no pneumothorax. IMPRESSION: 1. Improved aeration of the right base with decreased size of the right pleural effusion. 2. No significant interval change in size of the left pleural effusion with adjacent consolidation. 3. Overall unchanged pulmonary interstitial edema. Electronically Signed   By: Valetta Mole M.D.   On: 09/29/2020 08:09    Scheduled Meds:  apixaban  5 mg Oral BID   arformoterol  15 mcg Nebulization BID   budesonide (PULMICORT) nebulizer solution  0.5 mg Nebulization BID   Chlorhexidine Gluconate Cloth  6 each Topical Daily   [START ON 09/30/2020] diltiazem  180 mg Oral Daily   furosemide  80 mg Intravenous BID   guaiFENesin  600 mg Oral BID   mouth rinse  15 mL Mouth Rinse BID   methylPREDNISolone (SOLU-MEDROL) injection  40 mg Intravenous Q12H   polyethylene glycol  17 g Oral BID   revefenacin  175 mcg Nebulization Daily   Continuous Infusions:  sodium chloride 10 mL/hr at 09/28/20 1849    LOS: 7 days   Kerney Elbe, DO [facility] PAGER is on AMION  If 7PM-7AM, please contact night-coverage [email or web address]

## 2020-09-30 ENCOUNTER — Inpatient Hospital Stay (HOSPITAL_COMMUNITY): Payer: Medicare HMO

## 2020-09-30 ENCOUNTER — Encounter (HOSPITAL_COMMUNITY): Payer: Self-pay | Admitting: Internal Medicine

## 2020-09-30 DIAGNOSIS — I4891 Unspecified atrial fibrillation: Secondary | ICD-10-CM | POA: Diagnosis not present

## 2020-09-30 DIAGNOSIS — R7989 Other specified abnormal findings of blood chemistry: Secondary | ICD-10-CM | POA: Diagnosis not present

## 2020-09-30 DIAGNOSIS — J189 Pneumonia, unspecified organism: Secondary | ICD-10-CM | POA: Diagnosis not present

## 2020-09-30 DIAGNOSIS — I517 Cardiomegaly: Secondary | ICD-10-CM | POA: Diagnosis not present

## 2020-09-30 DIAGNOSIS — J9601 Acute respiratory failure with hypoxia: Secondary | ICD-10-CM | POA: Diagnosis not present

## 2020-09-30 DIAGNOSIS — J9621 Acute and chronic respiratory failure with hypoxia: Secondary | ICD-10-CM | POA: Diagnosis not present

## 2020-09-30 LAB — MAGNESIUM: Magnesium: 2.3 mg/dL (ref 1.7–2.4)

## 2020-09-30 LAB — CBC WITH DIFFERENTIAL/PLATELET
Abs Immature Granulocytes: 0.26 10*3/uL — ABNORMAL HIGH (ref 0.00–0.07)
Basophils Absolute: 0.1 10*3/uL (ref 0.0–0.1)
Basophils Relative: 0 %
Eosinophils Absolute: 0 10*3/uL (ref 0.0–0.5)
Eosinophils Relative: 0 %
HCT: 55.4 % — ABNORMAL HIGH (ref 36.0–46.0)
Hemoglobin: 18.1 g/dL — ABNORMAL HIGH (ref 12.0–15.0)
Immature Granulocytes: 2 %
Lymphocytes Relative: 4 %
Lymphs Abs: 0.7 10*3/uL (ref 0.7–4.0)
MCH: 31.3 pg (ref 26.0–34.0)
MCHC: 32.7 g/dL (ref 30.0–36.0)
MCV: 95.7 fL (ref 80.0–100.0)
Monocytes Absolute: 0.7 10*3/uL (ref 0.1–1.0)
Monocytes Relative: 4 %
Neutro Abs: 15.9 10*3/uL — ABNORMAL HIGH (ref 1.7–7.7)
Neutrophils Relative %: 90 %
Platelets: 298 10*3/uL (ref 150–400)
RBC: 5.79 MIL/uL — ABNORMAL HIGH (ref 3.87–5.11)
RDW: 13.4 % (ref 11.5–15.5)
WBC: 17.6 10*3/uL — ABNORMAL HIGH (ref 4.0–10.5)
nRBC: 0 % (ref 0.0–0.2)

## 2020-09-30 LAB — COMPREHENSIVE METABOLIC PANEL
ALT: 108 U/L — ABNORMAL HIGH (ref 0–44)
AST: 39 U/L (ref 15–41)
Albumin: 2.8 g/dL — ABNORMAL LOW (ref 3.5–5.0)
Alkaline Phosphatase: 65 U/L (ref 38–126)
Anion gap: 10 (ref 5–15)
BUN: 45 mg/dL — ABNORMAL HIGH (ref 8–23)
CO2: 43 mmol/L — ABNORMAL HIGH (ref 22–32)
Calcium: 8.9 mg/dL (ref 8.9–10.3)
Chloride: 84 mmol/L — ABNORMAL LOW (ref 98–111)
Creatinine, Ser: 0.67 mg/dL (ref 0.44–1.00)
GFR, Estimated: >60 mL/min (ref >60)
Glucose, Bld: 165 mg/dL — ABNORMAL HIGH (ref 70–99)
Potassium: 4.2 mmol/L (ref 3.5–5.1)
Sodium: 137 mmol/L (ref 135–145)
Total Bilirubin: 1.2 mg/dL (ref 0.3–1.2)
Total Protein: 6.1 g/dL — ABNORMAL LOW (ref 6.5–8.1)

## 2020-09-30 LAB — MISC LABCORP TEST (SEND OUT): Labcorp test code: 9985

## 2020-09-30 LAB — SJOGRENS SYNDROME-A EXTRACTABLE NUCLEAR ANTIBODY: SSA (Ro) (ENA) Antibody, IgG: <0.2 AI (ref 0.0–0.9)

## 2020-09-30 LAB — SAR COV2 SEROLOGY (COVID19)AB(IGG),IA: SARS-CoV-2 Ab, IgG: NONREACTIVE

## 2020-09-30 LAB — ALDOLASE: Aldolase: 8.2 U/L (ref 3.3–10.3)

## 2020-09-30 LAB — LEGIONELLA PNEUMOPHILA SEROGP 1 UR AG: L. pneumophila Serogp 1 Ur Ag: NEGATIVE

## 2020-09-30 LAB — CENTROMERE ANTIBODIES: Centromere Ab Screen: <0.2 AI (ref 0.0–0.9)

## 2020-09-30 LAB — ANA W/REFLEX IF POSITIVE: Anti Nuclear Antibody (ANA): NEGATIVE

## 2020-09-30 LAB — ANTI-JO 1 ANTIBODY, IGG: Anti JO-1: <0.2 AI (ref 0.0–0.9)

## 2020-09-30 LAB — SJOGRENS SYNDROME-B EXTRACTABLE NUCLEAR ANTIBODY: SSB (La) (ENA) Antibody, IgG: 1.1 AI — ABNORMAL HIGH (ref 0.0–0.9)

## 2020-09-30 LAB — LIPASE, BLOOD: Lipase: 54 U/L — ABNORMAL HIGH (ref 11–51)

## 2020-09-30 LAB — PHOSPHORUS: Phosphorus: 3.9 mg/dL (ref 2.5–4.6)

## 2020-09-30 MED ORDER — IOHEXOL 350 MG/ML SOLN
100.0000 mL | Freq: Once | INTRAVENOUS | Status: AC | PRN
  Administered 2020-09-30: 80 mL via INTRAVENOUS

## 2020-09-30 MED ORDER — METOPROLOL TARTRATE 5 MG/5ML IV SOLN
5.0000 mg | Freq: Once | INTRAVENOUS | Status: AC
  Administered 2020-09-30: 5 mg via INTRAVENOUS
  Filled 2020-09-30: qty 5

## 2020-09-30 MED ORDER — IOHEXOL 9 MG/ML PO SOLN
ORAL | Status: AC
  Administered 2020-09-30: 1000 mL
  Filled 2020-09-30: qty 1000

## 2020-09-30 NOTE — Progress Notes (Signed)
Occupational Therapy Progress Note  Patient agreeable to getting out of bed, think she may have to have a bowel movement. Patient needing mod A to upright trunk to sitting reports dizziness however BP stable. Patient needing min A x2 to power up to standing and pivot to [facility]. HR does increase with standing activity up to 150s, settles within ~30 seconds seated rest back to low 100s. Patient needing total A for perianal care after bowel movement due to reliant on B UE support and decreased standing tolerance. Patient min A x2 with cues to sequence turning to recliner chair. O2 remained stable during session 89-92% on HHFNC 30L and 80% Fio2.    09/30/20 1400  OT Visit Information  Last OT Received On 09/30/20  Assistance Needed +2  History of Present Illness Patient is a 80 year old female presented with shortness of breath, body aches, decreased appetite and coughing. Per DTR patient diagnosedw ith COVID week previous to admission. PMH includes COPD, Covid negative but on precautions  Precautions  Precautions Fall  Precaution Comments monitor vitals; currently on HHFNC, 30L, 80 %  Pain Assessment  Pain Assessment Faces  Faces Pain Scale 0  Cognition  Arousal/Alertness Awake/alert  Behavior During Therapy Flat affect  Overall Cognitive Status Within Functional Limits for tasks assessed  ADL  Overall ADL's  Needs assistance/impaired  Toilet Transfer Minimal assistance;+2 for physical assistance;+2 for safety/equipment;BSC  Toilet Transfer Details (indicate cue type and reason) min x2 to power up to standing from edge of bed and bedside commode, limited standing tolerance with increase in HR  Toileting- Clothing Manipulation and Hygiene Total assistance;Sit to/from stand  Toileting - Clothing Manipulation Details (indicate cue type and reason) reliant on UE support and min A x1 to maintain static stand while OT perform perianal care  Functional mobility during ADLs Minimal assistance;+2 for  physical assistance;+2 for safety/equipment;Cueing for sequencing  General ADL Comments patient HR increase between 138-150s with activity settled back to low 100s after ~30 second seated rest. O2 remains stable between 89-92% on HHFN 30L 80% FiO2.  Bed Mobility  Overal bed mobility Needs Assistance  Bed Mobility Supine to Sit  Supine to sit Mod assist;HOB elevated  General bed mobility comments for trunk support  Balance  Overall balance assessment Needs assistance  Sitting-balance support Feet supported  Sitting balance-Leahy Scale Fair  Standing balance support Single extremity supported;Bilateral upper extremity supported  Standing balance-Leahy Scale Poor  Standing balance comment reliant on min A 1-2  Transfers  Overall transfer level Needs assistance  Equipment used 2 person hand held assist  Transfers Sit to/from Stand;Stand Pivot Transfers  Sit to Stand Min assist;+2 physical assistance;+2 safety/equipment  Stand pivot transfers Min assist;+2 physical assistance;+2 safety/equipment  General transfer comment please see toilet transfer  OT - End of Session  Equipment Utilized During Treatment Oxygen;Gait belt  Activity Tolerance Patient tolerated treatment well;Patient limited by fatigue  Patient left in chair;with call bell/phone within reach;with chair alarm set  Nurse Communication Mobility status;Other (comment) (patient had BM)  OT Assessment/Plan  OT Plan Discharge plan remains appropriate  OT Visit Diagnosis Unsteadiness on feet (R26.81);Other abnormalities of gait and mobility (R26.89);Muscle weakness (generalized) (M62.81)  OT Frequency (ACUTE ONLY) Min 2X/week  Follow Up Recommendations SNF  OT Equipment Other (comment) (defer next venue)  AM-PAC OT "6 Clicks" Daily Activity Outcome Measure (Version 2)  Help from another person eating meals? 3  Help from another person taking care of personal grooming? 3  Help from another  person toileting, which includes using  toliet, bedpan, or urinal? 2  Help from another person bathing (including washing, rinsing, drying)? 2  Help from another person to put on and taking off regular upper body clothing? 3  Help from another person to put on and taking off regular lower body clothing? 1  6 Click Score 14  Progressive Mobility  What is the highest level of mobility based on the progressive mobility assessment? Level 3 (Stands with assist) - Balance while standing  and cannot march in place  Mobility Out of bed to chair with meals;Out of bed for toileting  OT Goal Progression  Progress towards OT goals Progressing toward goals  Acute Rehab OT Goals  Patient Stated Goal feel better  OT Goal Formulation With patient  Time For Goal Achievement 10/09/20  Potential to Achieve Goals Good  ADL Goals  Pt Will Perform Lower Body Dressing with min assist;sit to/from stand;sitting/lateral leans  Pt Will Transfer to Toilet with min assist;stand pivot transfer;bedside commode  Pt Will Perform Toileting - Clothing Manipulation and hygiene with min assist;sit to/from stand;sitting/lateral leans  Pt/caregiver will Perform Home Exercise Program Increased strength;Both right and left upper extremity;With theraband;Independently;With written HEP provided  OT Time Calculation  OT Start Time (ACUTE ONLY) 1014  OT Stop Time (ACUTE ONLY) 1047  OT Time Calculation (min) 33 min  OT General Charges  $OT Visit 1 Visit  OT Treatments  $Self Care/Home Management  23-37 mins   [name] OT OT pager: [phone]

## 2020-09-30 NOTE — Progress Notes (Addendum)
PROGRESS NOTE    Kimberly Ballard  HBZ:[id number] DOB: 01-29-1941 DOA: 09/22/2020 PCP: System, Provider Not In  Brief Narrative:  The patient is 80 year old female with history of COPD presented with shortness of breath, body aches, decreased appetite and coughing.  Patients daughter was diagnosed with COVID last week.  She was started on Paxilovid empirically due to risk of exposure.  Patient continued to get worse and developed shortness of breath.  On EMS arrival she required 6 L/min of oxygen.  In the ED SARS-CoV-2 RT-PCR was negative.  Chest x-ray showed bilateral infiltrates, CTA was negative for PE but showed evidence of bilateral infiltrates.  Patient started on Rocephin and Zithromax for community-acquired pneumonia.  Subsequently she decompensated and worsened and went into A. fib with RVR and had to be placed on BiPAP and she is subsequently moved to the stepdown unit.  We are empirically treating for COVID and pulmonary has been consulted as well as cardiology.  Cardiology started the patient on Cardizem drip and pulmonary recommending continuing steroids.  She is weaned to 15 L nonrebreather and remains in A. fib without RVR so her Cardizem is being changed to 180 mg p.o. daily and changing heparin to Eliquis and discontinue aspirin.  Cardiology recommends continue Lasix but now the dose is changed to IV Lasix twice daily 80 mg.  She was given IV Lasix 40 mg yesterday.  Cardiology feels that she has some diastolic CHF in the setting of A. fib with RVR and they are planning on repeating echo in 6 weeks.  Cardiology recommends continuing Cardizem 120 mg daily and Eliquis 5 mg twice daily.    Respiratory status is slowly improving and after goals of care discussion with pulmonary she was made DNR.  We are recommending continue to get out of bed with assistance and recommending continue diuresis and steroids at current dosing.  We will need to follow-up with nephrology panel patent and pulmonary now  recommends tolerating periods of hypoxemia with a go at rest greater than 85% and ideally would 75%  09/29/2020 her heart rates were uncontrolled and she went back in A. fib with RVR.  She was given a dose of IV metoprolol and her po  Cardizem was increased.  Her respiratory status remains tenuous so we will continue diuresis and continue steroids.  PT OT recommending SNF. On 09/30/20 her HR remain elevated so will give another dose of IV Metoprolol 5 mg and if continues to sustain will re-consult Cardiology and likely place on a Cardizem gtt. she is also complaining of mid abdominal pain to obtain CT scan of the abdomen pelvis as well as a KUB as well as adding a lipase level on  Assessment & Plan:   Active Problems:   CAP (community acquired pneumonia)   Acute respiratory failure with hypoxia and hypercapnia (HCC)   Sepsis ([place])   Elevated LFTs   Cardiomegaly   Elevated troponin   Atrial fibrillation (HCC)   Acute and chronic respiratory failure (acute-on-chronic) (HCC)  Acute respiratory failure with hypoxia requiring noninvasive positive pressure ventilation with BiPAP 12/6 60% FiO2 -Multifactorial from underlying multifocal pneumonia, diastolic CHF -Patient is currently on Rocephin and Zithromax and will continue -Follow blood culture results -She is given IV Lasix 40 mg daily for last 3 days but now she will be getting IV 80 mg twice daily per pulmonary recommendations -CTA of the Chest PE protocol done and showed "No evidence of acute pulmonary embolism. Patchy consolidative opacities in the lung  bases and  lingula have slightly worsened compared to the prior study and remain concerning for multifocal pneumonia, with differential including COVID. As before, follow-up chest CT in 3 months is recommended to assess for resolution. Unchanged cardiomegaly with evidence of right heart failure and pulmonary hypertension as above. Coronary artery calcifications and Aortic  Atherosclerosis (ICD10-I70.0). -SpO2: (!) 87 % O2 Flow Rate (L/min): 30 L/min FiO2 (%): 80 % ; was on 15 L nonrebreather and now oxygen requirement is improving -Inflammatory Markers No results for input(s): DDIMER, FERRITIN, LDH, CRP in the last 72 hours. -ABG    Component Value Date/Time   PHART 7.325 (L) 09/25/2020 0320   PCO2ART 67.6 (HH) 09/25/2020 0320   PO2ART 130 (H) 09/25/2020 0320   HCO3 34.2 (H) 09/25/2020 0320   O2SAT 98.6 09/25/2020 0320   Lab Results  Component Value Date   SARSCOV2NAA NEGATIVE 09/24/2020   [place] NEGATIVE 09/22/2020   -Leukocytosis was resolving but now slightly worsened in the setting of steroid demargination and is now improving again.  WBC is now gone from 19.2 is now 17.6 -> 17.6 again -Speech therapy consulted for swallow evaluation -Appreciate PCCM consult and they placed the patient on BiPAP but has now been weaned to 15 L; pulmonary feels that this is clinically COVID given that hypoxemia and hypercapnia however now her isolation precautions have been discontinued -Pulmonary recommends continuing BiPAP nightly and 2 hours twice a day -They are recommending nebs with Brovana/Pulmicort/Yupelri as below -Steroids are being completed however pulmonary recommends continue steroids for now and she will be continued on Solu-Medrol 40 mg every 12h  -Continuing Xopenex 1.25 mg nebs every 4 hours for wheezing shortness of breath as well as revefenacin 175 mcg nebulized daily and budesonide 0.5 mg neb twice daily -Abx have now been discontinued. -Continue with IV Lasix per pulmonary recommendations with IV Lasix 80 mg twice daily; she is -10.1553 L but NO I's and O's reported for today  -Clear liquid diet was initiated yesterday but now is advance to oral diet -Pulmonary recommends continue diuresis -CXR yesterday evening showed "Cardiomegaly with mild interstitial edema and small bilateral pleural effusions. Superimposed mild patchy right lower lobe  opacity, unchanged, atelectasis versus pneumonia." -Pulmonary also recommends maintaining ICU today mobilizing getting up and out of bed; pulmonary is also checking serology for looking for systemic problems that can lead to inflammatory lung disease and serology panel has been sent and pending -Repeat chest x-ray in a.m.  A. fib with RVR  -Heart rates initially controlled on p.o. Cardizem 120 however the worsened yesterday so her Cardizem was increased to 180 mg p.o. daily and she was given IV metoprolol 5 mg x 1 -Was on anticoagulation with IV heparin given her CHA2DS2-VASc score of 3 but was changed to p.o. Eliquis by cardiology -Continue to monitor on telemetry  -Increased po Cardizem to 180 mg po Daily -Because her HR was uncontrolled and Tachycardic this AM will give another IV bolus of Metoprolol   Abdominal pain -Mid abdomen pain noted today especially palpation; abdomen is distended however she still having bowel movements - Obtain KUB and a CT scan of the abdomen and pelvis with contrast -Also check a lipase level  AKI -Resolving; BUN/Cr is now 51/0.85 -Patient's BUNs/creatinine was elevated to 69/1.28 -Avoid nephrotoxic medications, contrast dyes, hypotension and renally dose medications -Continue monitor and trend renal function and repeat CMP in a.m.   Acute Diastolic CHF/right ventricular function/pulmonary hypertension -Echocardiogram showed EF of 55-60% with Grade 1 DD  and LA and RA mildly and moderately dilated respectively; The patients RVSF was mildly reduced at the base and mid with relatively persevered apical systolic fxn and RV was moderately enlarged with moderately elevated PA Systolic Pressure -She continues to has bilateral crackles on auscultation -Cardiology recommends continuing diuresis with IV Lasix and now Pulmonary is diuresing with IV Lasix 80 mg BID -BNP was elevated but cardiology feels that this may be underlying related to respiratory issues -Strict  I's and O's and daily weights and she -10.1 L as above but I's and O's not done today  -Given her history of COPD with acute exacerbation multifocal pneumonia, cardiology feels that the hypoxemia is resulting in acute right ventricular strain with right ventricular dilatation and right ventricular dysfunction -Patient has moderate pulm hypertension on echo related to underlying COPD with acute exacerbation in the setting of hypoxemia -Repeat CTA PE as above -BNP was elevated at 889.7 and worsened and BNP was elevated to 964.7 -Cardiology has been consulted and is now signed off on diuresis and now managed by pulmonary   Elevated Troponin -Likely demand ischemia -hSTrop 12 -> 100 and then trended down to 52 -EKG shows T wave abnormalities in anterior chest leads -Cardiology has been consulted and have now signed off the case   Abnormal LFTs/Transaminitis -Unclear etiology -?  Passive liver congestion from CHF -Total bilirubin is normal, alk phos is normal -LFTs on admission showed an AST of 503 and ALT of 439; now improving as AST is now 39 and ALT is 108  Constipation -Continue bowel regimen with senna docusate 1 tab p.o. nightly as needed for mild constipation we will add MiraLAX 17 g p.o. twice daily  Right middle lobe nodule -Will need repeat CT in 3 months after treatment of pneumonia   Obesity -Complicates overall prognosis and care -Estimated body mass index is 35.5 kg/m as calculated from the following:   Height as of this encounter: 5' 3"  (1.6 m).   Weight as of this encounter: 90.9 kg. -Weight Loss and Dietary Counseling   DVT prophylaxis: Anticoagulated with Eliquis  Code Status: FULL CODE  Family Communication: No family currently at bedside Disposition Plan: Continue to monitor in the stepdown unit  Status is: Inpatient  Remains inpatient appropriate because:Unsafe d/c plan, IV treatments appropriate due to intensity of illness or inability to take PO, and  Inpatient level of care appropriate due to severity of illness  Dispo: The patient is from: Home              Anticipated d/c is to:  TBD              Patient currently is not medically stable to d/c.   Difficult to place patient No   Consultants:  Pulmonary/PCCM Cardiology  Procedures:  ECHOCARDIOGRAM IMPRESSIONS     1. Left ventricular ejection fraction, by estimation, is 55 to 60%. Left  ventricular ejection fraction by 3D volume is 59 %. The left ventricle has  normal function. The left ventricle has no regional wall motion  abnormalities. There is moderate left  ventricular hypertrophy. Left ventricular diastolic parameters are  consistent with Grade I diastolic dysfunction (impaired relaxation).   2. Right ventricular systolic function mildly reduced at base and mid,  with relative preserved apical systolic function. The right ventricular  size is moderately enlarged. There is moderately elevated pulmonary artery  systolic pressure. The estimated  right ventricular systolic pressure is 74.0 mmHg.   3. Left atrial size was mildly  dilated.   4. Right atrial size was moderately dilated.   5. The mitral valve is normal in structure. Trivial mitral valve  regurgitation. No evidence of mitral stenosis.   6. Tricuspid valve regurgitation is mild to moderate.   7. The aortic valve is normal in structure. Aortic valve regurgitation is  not visualized. No aortic stenosis is present.   8. The inferior vena cava is dilated in size with <50% respiratory  variability, suggesting right atrial pressure of 15 mmHg.   FINDINGS   Left Ventricle: Left ventricular ejection fraction, by estimation, is 55  to 60%. Left ventricular ejection fraction by 3D volume is 59 %. The left  ventricle has normal function. The left ventricle has no regional wall  motion abnormalities. The left  ventricular internal cavity size was normal in size. There is moderate  left ventricular hypertrophy. Left  ventricular diastolic parameters are  consistent with Grade I diastolic dysfunction (impaired relaxation).   Right Ventricle: Calcified moderate band. The right ventricular size is  moderately enlarged. No increase in right ventricular wall thickness.  Right ventricular systolic function mildly reduced at base and mid, with  relative preserved apical systolic  function. There is moderately elevated pulmonary artery systolic pressure.  The tricuspid regurgitant velocity is 2.89 m/s, and with an assumed right  atrial pressure of 15 mmHg, the estimated right ventricular systolic  pressure is 23.3 mmHg.   Left Atrium: Left atrial size was mildly dilated.   Right Atrium: Right atrial size was moderately dilated.   Pericardium: There is no evidence of pericardial effusion. Presence of  pericardial fat pad.   Mitral Valve: The mitral valve is normal in structure. Mild mitral annular  calcification. Trivial mitral valve regurgitation. No evidence of mitral  valve stenosis.   Tricuspid Valve: The tricuspid valve is normal in structure. Tricuspid  valve regurgitation is mild to moderate. No evidence of tricuspid  stenosis.   Aortic Valve: The aortic valve is normal in structure. Aortic valve  regurgitation is not visualized. No aortic stenosis is present.   Pulmonic Valve: The pulmonic valve was normal in structure. Pulmonic valve  regurgitation is trivial. No evidence of pulmonic stenosis.   Aorta: Aorta dimension upper limit of normal for age and BSA. The aortic  root is normal in size and structure.   Venous: The inferior vena cava is dilated in size with less than 50%  respiratory variability, suggesting right atrial pressure of 15 mmHg.   IAS/Shunts: The interatrial septum appears to be lipomatous. No atrial  level shunt detected by color flow Doppler.      LEFT VENTRICLE  PLAX 2D  LVIDd:         3.60 cm         Diastology  LVIDs:         2.50 cm         LV e' medial:    6.31  cm/s  LV PW:         1.30 cm         LV E/e' medial:  9.0  LV IVS:        1.30 cm         LV e' lateral:   10.20 cm/s  LVOT diam:     2.00 cm         LV E/e' lateral: 5.6  LV SV:         54  LV SV Index:   28  LVOT Area:  3.14 cm        3D Volume EF                                 LV 3D EF:    Left                                              ventricul  LV Volumes (MOD)                            ar  LV vol d, MOD    73.9 ml                    ejection  A2C:                                        fraction  LV vol d, MOD    53.3 ml                    by 3D  A4C:                                        volume is  LV vol s, MOD    28.2 ml                    59 %.  A2C:  LV vol s, MOD    20.1 ml  A4C:                           3D Volume EF:  LV SV MOD A2C:   45.7 ml       3D EF:        59 %  LV SV MOD A4C:   53.3 ml  LV SV MOD BP:    38.1 ml   RIGHT VENTRICLE             IVC  RV S prime:     10.60 cm/s  IVC diam: 2.90 cm  TAPSE (M-mode): 1.8 cm   LEFT ATRIUM             Index       RIGHT ATRIUM           Index  LA diam:        3.90 cm 2.02 cm/m  RA Area:     25.80 cm  LA Vol (A2C):   33.2 ml 17.16 ml/m RA Volume:   96.90 ml  50.07 ml/m  LA Vol (A4C):   54.8 ml 28.32 ml/m  LA Biplane Vol: 43.4 ml 22.43 ml/m   AORTIC VALVE             PULMONIC VALVE  LVOT Vmax:   95.70 cm/s  PR End Diast Vel: 1.35 msec  LVOT Vmean:  61.300 cm/s  LVOT VTI:    0.172 m     AORTA  Ao Root diam: 3.70 cm  Ao Asc diam:  3.80 cm   MITRAL VALVE  TRICUSPID VALVE  MV Area (PHT): 2.95 cm    TV Peak grad:   34.6 mmHg  MV Decel Time: 257 msec    TV Vmax:        2.94 m/s  MV E velocity: 56.80 cm/s  TR Peak grad:   33.4 mmHg  MV A velocity: 82.10 cm/s  TR Vmax:        289.00 cm/s  MV E/A ratio:  0.69                             SHUNTS                             Systemic VTI:  0.17 m                             Systemic Diam: 2.00 cm   Antimicrobials:  Anti-infectives (From  admission, onward)    Start     Dose/Rate Route Frequency Ordered Stop   09/23/20 1800  cefTRIAXone (ROCEPHIN) 2 g in sodium chloride 0.9 % 100 mL IVPB        2 g 200 mL/hr over 30 Minutes Intravenous Every 24 hours 09/22/20 1842 09/29/20 1749   09/23/20 1800  azithromycin (ZITHROMAX) 500 mg in sodium chloride 0.9 % 250 mL IVPB  Status:  Discontinued        500 mg 250 mL/hr over 60 Minutes Intravenous Every 24 hours 09/22/20 1842 09/25/20 1456   09/22/20 1630  cefTRIAXone (ROCEPHIN) 1 g in sodium chloride 0.9 % 100 mL IVPB        1 g 200 mL/hr over 30 Minutes Intravenous  Once 09/22/20 1618 09/22/20 1806   09/22/20 1630  azithromycin (ZITHROMAX) 500 mg in sodium chloride 0.9 % 250 mL IVPB        500 mg 250 mL/hr over 60 Minutes Intravenous  Once 09/22/20 1618 09/22/20 1939        Subjective: Seen and examined and feels fatigued.  Thinks her shortness of breath is improved minimally.  No nausea or vomiting.  Heart rates remain relatively uncontrolled and she went back into A. fib with RVR that was sustained so she will give another dose of IV metoprolol.  She remains very fatigued and has no other concerns or complaints at this time.  Objective: Vitals:   09/30/20 0800 09/30/20 0900 09/30/20 1000 09/30/20 1200  BP: 116/87 104/82 128/77   Pulse: (!) 108 70 (!) 101   Resp: (!) 22 (!) 26 (!) 28   Temp: 97.6 F (36.4 C)   97.7 F (36.5 C)  TempSrc: Oral   Oral  SpO2: 93% (!) 88% (!) 87%   Weight:      Height:        Intake/Output Summary (Last 24 hours) at 09/30/2020 1439 Last data filed at 09/29/2020 1719 Gross per 24 hour  Intake 200 ml  Output 850 ml  Net -650 ml    Filed Weights   09/22/20 2020  Weight: 90.9 kg   Examination: Physical Exam:  Constitutional: WN/WD, obese chronically ill-appearing Caucasian female currently in in some respiratory distress no longer wearing BiPAP but continues to be very fatigued  Eyes: Lids and conjunctivae normal, sclerae anicteric   ENMT: External Ears, Nose appear normal.  She is hard of hearing Neck: Appears normal, supple, no cervical masses, normal ROM,  no appreciable thyromegaly; mild JVD Respiratory: Diminished to auscultation bilaterally with coarse breath sounds and some crackles, no wheezing, rales, rhonchi or crackles.  Has slightly increased respiratory effort and she is wearing heated high flow nasal cannula Cardiovascular: Irregularly irregular and she is slightly tachycardic, no murmurs / rubs / gallops.  1+ lower extremity edema Abdomen: Soft, non-tender, distended secondary body habitus. Bowel sounds positive.  GU: Deferred. Musculoskeletal: No clubbing / cyanosis of digits/nails. No joint deformity upper and lower extremities.  Skin: No rashes, lesions, ulcers. No induration; Warm and dry.  Neurologic: CN 2-12 grossly intact with no focal deficits. Romberg sign and cerebellar reflexes not assessed.  Psychiatric: Normal judgment and insight. Alert and oriented x 3.  Anxious mood and appropriate affect.   Data Reviewed: I have personally reviewed following labs and imaging studies  CBC: Recent Labs  Lab 09/26/20 0303 09/27/20 0311 09/28/20 0311 09/29/20 0311 09/30/20 0255  WBC 14.2* 15.1* 19.2* 17.6* 17.6*  NEUTROABS  --  13.4* 17.3* 15.7* 15.9*  HGB 15.8* 16.3* 17.6* 17.9* 18.1*  HCT 51.3* 50.2* 53.8* 53.6* 55.4*  MCV 102.6* 98.2 96.1 95.4 95.7  PLT 329 325 370 324 573    Basic Metabolic Panel: Recent Labs  Lab 09/26/20 0303 09/26/20 0903 09/27/20 0311 09/27/20 1824 09/28/20 0311 09/29/20 0311 09/30/20 0255  NA 142   < > 145 140 140 139 137  K 4.9   < > 4.2 3.8 3.8 3.9 4.2  CL 101   < > 96* 84* 88* 85* 84*  CO2 32   < > 41* 39* 39* 41* 43*  GLUCOSE 168*   < > 175* 198* 152* 147* 165*  BUN 37*   < > 38* 45* 46* 51* 45*  CREATININE 0.58   < > 0.57 0.82 0.70 0.85 0.67  CALCIUM 8.6*   < > 8.8* 8.8* 8.7* 8.6* 8.9  MG 2.8*  --  2.4  --  2.4 2.3 2.3  PHOS 3.6  --  4.3  --  4.5 4.3 3.9    < > = values in this interval not displayed.    GFR: Estimated Creatinine Clearance: 61 mL/min (by C-G formula based on SCr of 0.67 mg/dL). Liver Function Tests: Recent Labs  Lab 09/27/20 0311 09/27/20 1824 09/28/20 0311 09/29/20 0311 09/30/20 0255  AST 35 42* 37 40 39  ALT 149* 146* 129* 112* 108*  ALKPHOS 75 89 78 68 65  BILITOT 0.7 1.1 0.8 1.0 1.2  PROT 6.4* 7.2 6.5 6.0* 6.1*  ALBUMIN 2.6* 3.0* 2.8* 2.7* 2.8*    No results for input(s): LIPASE, AMYLASE in the last 168 hours. No results for input(s): AMMONIA in the last 168 hours. Coagulation Profile: No results for input(s): INR, PROTIME in the last 168 hours.  Cardiac Enzymes: No results for input(s): CKTOTAL, CKMB, CKMBINDEX, TROPONINI in the last 168 hours.  BNP (last 3 results) No results for input(s): PROBNP in the last 8760 hours. HbA1C: No results for input(s): HGBA1C in the last 72 hours. CBG: Recent Labs  Lab 09/27/20 1158  GLUCAP 183*    Lipid Profile: No results for input(s): CHOL, HDL, LDLCALC, TRIG, CHOLHDL, LDLDIRECT in the last 72 hours.  Thyroid Function Tests: No results for input(s): TSH, T4TOTAL, FREET4, T3FREE, THYROIDAB in the last 72 hours.  Anemia Panel: No results for input(s): VITAMINB12, FOLATE, FERRITIN, TIBC, IRON, RETICCTPCT in the last 72 hours.  Sepsis Labs: Recent Labs  Lab 09/24/20 0323  PROCALCITON 0.23    Recent Results (from  the past 240 hour(s))  Resp Panel by RT-PCR (Flu A&B, Covid) Nasopharyngeal Swab     Status: None   Collection Time: 09/22/20  2:41 PM   Specimen: Nasopharyngeal Swab; Nasopharyngeal(NP) swabs in vial transport medium  Result Value Ref Range Status   SARS Coronavirus 2 by RT PCR NEGATIVE NEGATIVE Final    Comment: (NOTE) SARS-CoV-2 target nucleic acids are NOT DETECTED.  The SARS-CoV-2 RNA is generally detectable in upper respiratory specimens during the acute phase of infection. The lowest concentration of SARS-CoV-2 viral copies this  assay can detect is 138 copies/mL. A negative result does not preclude SARS-Cov-2 infection and should not be used as the sole basis for treatment or other patient management decisions. A negative result may occur with  improper specimen collection/handling, submission of specimen other than nasopharyngeal swab, presence of viral mutation(s) within the areas targeted by this assay, and inadequate number of viral copies(<138 copies/mL). A negative result must be combined with clinical observations, patient history, and epidemiological information. The expected result is Negative.  Fact Sheet for Patients:  EntrepreneurPulse.com.au  Fact Sheet for Healthcare Providers:  IncredibleEmployment.be  This test is no t yet approved or cleared by the [place] FDA and  has been authorized for detection and/or diagnosis of SARS-CoV-2 by FDA under an Emergency Use Authorization (EUA). This EUA will remain  in effect (meaning this test can be used) for the duration of the COVID-19 declaration under Section 564(b)(1) of the Act, 21 U.S.C.section 360bbb-3(b)(1), unless the authorization is terminated  or revoked sooner.       Influenza A by PCR NEGATIVE NEGATIVE Final   Influenza B by PCR NEGATIVE NEGATIVE Final    Comment: (NOTE) The Xpert Xpress SARS-CoV-2/FLU/RSV plus assay is intended as an aid in the diagnosis of influenza from Nasopharyngeal swab specimens and should not be used as a sole basis for treatment. Nasal washings and aspirates are unacceptable for Xpert Xpress SARS-CoV-2/FLU/RSV testing.  Fact Sheet for Patients: EntrepreneurPulse.com.au  Fact Sheet for Healthcare Providers: IncredibleEmployment.be  This test is not yet approved or cleared by the [place] FDA and has been authorized for detection and/or diagnosis of SARS-CoV-2 by FDA under an Emergency Use Authorization (EUA). This EUA will  remain in effect (meaning this test can be used) for the duration of the COVID-19 declaration under Section 564(b)(1) of the Act, 21 U.S.C. section 360bbb-3(b)(1), unless the authorization is terminated or revoked.  Performed at [facility], [place] [address], [place], [place] [address]   Blood Culture (routine x 2)     Status: None   Collection Time: 09/22/20  2:41 PM   Specimen: BLOOD  Result Value Ref Range Status   Specimen Description   Final    BLOOD BLOOD LEFT FOREARM Performed at [place] [address], [place], [place] [address]    Special Requests   Final    BOTTLES DRAWN AEROBIC AND ANAEROBIC Blood Culture adequate volume Performed at [place] [address] [place], [place] [address]    Culture   Final    NO GROWTH 5 DAYS Performed at [facility], [place] [address] [place], [place] [address]    Report Status 09/27/2020 FINAL  Final  Blood Culture (routine x 2)     Status: None   Collection Time: 09/22/20  5:20 PM   Specimen: BLOOD LEFT HAND  Result Value Ref Range Status   Specimen Description   Final    BLOOD  LEFT HAND Performed at [facility], [place] [address] [address] [place], [place] [address]    Special Requests   Final    BOTTLES DRAWN AEROBIC AND ANAEROBIC Blood Culture results may not be optimal due to an excessive volume of blood received in culture bottles Performed at [facility] [address] [place], [place] [address]    Culture   Final    NO GROWTH 5 DAYS Performed at [facility], [place] [address] [place], [place] [address]    Report Status 09/27/2020 FINAL  Final  Respiratory (~20 pathogens) panel by PCR     Status: None   Collection Time: 09/22/20 10:47 PM   Specimen: Nasopharyngeal Swab; Respiratory  Result Value Ref Range Status   Adenovirus NOT DETECTED NOT DETECTED Final   Coronavirus 229E NOT DETECTED NOT DETECTED Final    Comment:  (NOTE) The Coronavirus on the Respiratory Panel, DOES NOT test for the novel  Coronavirus (2019 nCoV)    Coronavirus HKU1 NOT DETECTED NOT DETECTED Final   Coronavirus NL63 NOT DETECTED NOT DETECTED Final   Coronavirus OC43 NOT DETECTED NOT DETECTED Final   Metapneumovirus NOT DETECTED NOT DETECTED Final   Rhinovirus / Enterovirus NOT DETECTED NOT DETECTED Final   Influenza A NOT DETECTED NOT DETECTED Final   Influenza B NOT DETECTED NOT DETECTED Final   Parainfluenza Virus 1 NOT DETECTED NOT DETECTED Final   Parainfluenza Virus 2 NOT DETECTED NOT DETECTED Final   Parainfluenza Virus 3 NOT DETECTED NOT DETECTED Final   Parainfluenza Virus 4 NOT DETECTED NOT DETECTED Final   Respiratory Syncytial Virus NOT DETECTED NOT DETECTED Final   Bordetella pertussis NOT DETECTED NOT DETECTED Final   Bordetella Parapertussis NOT DETECTED NOT DETECTED Final   Chlamydophila pneumoniae NOT DETECTED NOT DETECTED Final   Mycoplasma pneumoniae NOT DETECTED NOT DETECTED Final    Comment: Performed at [facility], [place]. [address] [place], [place] [address]  MRSA Next Gen by PCR, Nasal     Status: None   Collection Time: 09/24/20  5:24 AM   Specimen: Nasal Mucosa; Nasal Swab  Result Value Ref Range Status   MRSA by PCR Next Gen NOT DETECTED NOT DETECTED Final    Comment: (NOTE) The GeneXpert MRSA Assay (FDA approved for NASAL specimens only), is one component of a comprehensive MRSA colonization surveillance program. It is not intended to diagnose MRSA infection nor to guide or monitor treatment for MRSA infections. Test performance is not FDA approved in patients less than 42 years old. Performed at [facility], [place] [address], [place], [place] [address]   Resp Panel by RT-PCR (Flu A&B, Covid) Nasopharyngeal Swab     Status: None   Collection Time: 09/24/20  2:21 PM   Specimen: Nasopharyngeal Swab; Nasopharyngeal(NP) swabs in vial transport medium  Result Value Ref Range  Status   SARS Coronavirus 2 by RT PCR NEGATIVE NEGATIVE Final    Comment: (NOTE) SARS-CoV-2 target nucleic acids are NOT DETECTED.  The SARS-CoV-2 RNA is generally detectable in upper respiratory specimens during the acute phase of infection. The lowest concentration of SARS-CoV-2 viral copies this assay can detect is 138 copies/mL. A negative result does not preclude SARS-Cov-2 infection and should not be used as the sole basis for treatment or other patient management decisions. A negative result may occur with  improper specimen collection/handling, submission of specimen other than nasopharyngeal swab, presence of viral mutation(s) within the areas targeted by this assay, and  inadequate number of viral copies(<138 copies/mL). A negative result must be combined with clinical observations, patient history, and epidemiological information. The expected result is Negative.  Fact Sheet for Patients:  EntrepreneurPulse.com.au  Fact Sheet for Healthcare Providers:  IncredibleEmployment.be  This test is no t yet approved or cleared by the [place] FDA and  has been authorized for detection and/or diagnosis of SARS-CoV-2 by FDA under an Emergency Use Authorization (EUA). This EUA will remain  in effect (meaning this test can be used) for the duration of the COVID-19 declaration under Section 564(b)(1) of the Act, 21 U.S.C.section 360bbb-3(b)(1), unless the authorization is terminated  or revoked sooner.       Influenza A by PCR NEGATIVE NEGATIVE Final   Influenza B by PCR NEGATIVE NEGATIVE Final    Comment: (NOTE) The Xpert Xpress SARS-CoV-2/FLU/RSV plus assay is intended as an aid in the diagnosis of influenza from Nasopharyngeal swab specimens and should not be used as a sole basis for treatment. Nasal washings and aspirates are unacceptable for Xpert Xpress SARS-CoV-2/FLU/RSV testing.  Fact Sheet for  Patients: EntrepreneurPulse.com.au  Fact Sheet for Healthcare Providers: IncredibleEmployment.be  This test is not yet approved or cleared by the [place] FDA and has been authorized for detection and/or diagnosis of SARS-CoV-2 by FDA under an Emergency Use Authorization (EUA). This EUA will remain in effect (meaning this test can be used) for the duration of the COVID-19 declaration under Section 564(b)(1) of the Act, 21 U.S.C. section 360bbb-3(b)(1), unless the authorization is terminated or revoked.  Performed at [facility], [place] [address], [place], [place] [address]     RN Pressure Injury Documentation:     Estimated body mass index is 35.5 kg/m as calculated from the following:   Height as of this encounter: 5' 3"  (1.6 m).   Weight as of this encounter: 90.9 kg.  Malnutrition Type:  Malnutrition Characteristics:   Nutrition Interventions:     Radiology Studies: DG CHEST PORT 1 VIEW  Result Date: 09/29/2020 CLINICAL DATA:  Shortness of breath EXAM: PORTABLE CHEST 1 VIEW COMPARISON:  09/29/2020 at 1706 hours FINDINGS: Mild patchy right lower lobe opacity. Pulmonary vascular congestion with mild interstitial edema. Small bilateral pleural effusions. Cardiomegaly. IMPRESSION: Cardiomegaly with mild interstitial edema and small bilateral pleural effusions. Superimposed mild patchy right lower lobe opacity, unchanged, atelectasis versus pneumonia. Electronically Signed   By: Julian Hy M.D.   On: 09/29/2020 21:46   DG CHEST PORT 1 VIEW  Result Date: 09/29/2020 CLINICAL DATA:  Follow-up cardiomegaly with vascular congestion, pulmonary edema, and effusions EXAM: PORTABLE CHEST 1 VIEW COMPARISON:  Chest radiograph 09/27/2020 FINDINGS: The heart size is stable. The mediastinal contours are unchanged, allowing for leftward patient rotation. A left pleural effusion with dense left basilar consolidation is similar to  the prior study. Aeration of the right base has improved. Increased interstitial markings bilaterally are similar to the prior study consistent with pulmonary interstitial edema. There is no pneumothorax. IMPRESSION: 1. Improved aeration of the right base with decreased size of the right pleural effusion. 2. No significant interval change in size of the left pleural effusion with adjacent consolidation. 3. Overall unchanged pulmonary interstitial edema. Electronically Signed   By: Valetta Mole M.D.   On: 09/29/2020 08:09    Scheduled Meds:  apixaban  5 mg Oral BID   arformoterol  15 mcg Nebulization BID   budesonide (PULMICORT) nebulizer solution  0.5 mg Nebulization BID   Chlorhexidine Gluconate Cloth  6 each Topical  Daily   diltiazem  180 mg Oral Daily   furosemide  80 mg Intravenous BID   guaiFENesin  600 mg Oral BID   mouth rinse  15 mL Mouth Rinse BID   methylPREDNISolone (SOLU-MEDROL) injection  40 mg Intravenous Q12H   polyethylene glycol  17 g Oral BID   revefenacin  175 mcg Nebulization Daily   Continuous Infusions:  sodium chloride 10 mL/hr at 09/28/20 1849    LOS: 8 days   Kerney Elbe, DO [facility] PAGER is on AMION  If 7PM-7AM, please contact night-coverage [email or web address]

## 2020-09-30 NOTE — Progress Notes (Signed)
NAME:  Kimberly Ballard, MRN:  [id number], DOB:  [birth year], LOS: 8 ADMISSION DATE:  09/22/2020, CONSULTATION DATE:  8/23 REFERRING MD:  Adela Glimpse, CHIEF COMPLAINT:  Dyspnea, cough   History of Present Illness:  80 y/o femal presented to the [facility] ED with dyspnea, body aches, cough on 8/22 after a significant COVID exposure.  Her daughter had COVID and the patient developed symptoms around 8/16.  She discussed with her PCP and a paxlovid Rx was send on 8/18.  At [facility] she has had 2 negative tests for COVID (both PCR), but her IgG antibody was also negative. Per PCP notes she has received the primary series COVID vaccine as well as 2 boosters.    Pertinent  Medical History  COPD Atrial fibrillation Former cigarette smoker  Significant Hospital Events: Including procedures, antibiotic start and stop dates in addition to other pertinent events   8/22 > admit.  Admitted to stepdown unit.  Cough weak.  Pulmonary asked to evaluate given concern for progressive respiratory failure.  Initial COVID by respiratory panel was negative.  As was influenza.  Respiratory viral panel negative.  Still placed on isolation protocol given history.  IV ceftriaxone and azithromycin initiated.  Given IV Solu-Medrol followed by oral prednisone order. 8/23 pulmonary asked to evaluate given worsening respiratory failure.  CT chest showing lower lobe consolidation.  There was concern about possible aspiration.  Echo obtained showed good left ventricular function with mild pulmonary artery hypertension. RUQ ultrasound showed hepatic steatosis, possibly cirrhosis based on nodular liver contour, no gallbladder 8/24 cardiology consulted for elevated troponins.  This was felt secondary to worsening hypoxia and underlying pulmonary artery hypertension which was felt previously undiagnosed issue.  New onset atrial fibrillation with RVR.  CT angiogram ordered.  Increased oxygen requirement overnight, requiring BiPAP, later placed on 15 L/min  via high flow.  Lower extremity ultrasound negative for DVT 8/25: u strep neg.  still high FIO2. F/u COVID neg. Transitioned to oral CCB and DOAC. Getting IV lasix. Transitioned to headed High flow Equality during day and BIPAP at night.  8/26 still w/ overall net positive fluid balance over hospital stay. CXR w/ worse edema. Lasix increased to 80 mg q 12. Azithromycin stopped (day 5) Steroids reduced.  Cardiology signed off. (See note from cards w/ plan to follow as out-pt)  8/27 diuresed, CXR better, eating, got up to chair 8/28 FIO2 improved to 80%. Diuresing well 8/29 Fair UOP Interim History / Subjective:  AFRVR requiring IV metoprolol yesterday. Unchanged FIO2 and flow rate. Sitting in chair yesterday  Objective   Blood pressure 105/67, pulse 91, temperature 97.6 F (36.4 C), temperature source Oral, resp. rate (!) 24, height 5\' 3"  (1.6 m), weight 90.9 kg, SpO2 92 %.    FiO2 (%):  [80 %] 80 %   Intake/Output Summary (Last 24 hours) at 09/30/2020 1036 Last data filed at 09/29/2020 1719 Gross per 24 hour  Intake 440 ml  Output 1350 ml  Net -910 ml   Filed Weights   09/22/20 2020  Weight: 90.9 kg   Physical Exam: General: Chronically ill-appearing, no acute distress HENT: [place], AT, OP clear, MMM, heated high flow nasal cannula in place Eyes: EOMI, no scleral icterus Respiratory: Diminished breath sounds bilaterally.  No crackles, wheezing or rales Cardiovascular: Irregular rate and rhythm, -M/R/G, no JVD GI: BS+, soft, nontender Extremities:-Edema,-tenderness Neuro: AAO x3, CNII-XII grossly intact  Cr stable with diuresis Stable leukocytosis WBC 17   Resolved Hospital Problem list  Assessment & Plan:  Multifactorial Acute on chronic hypercapnic and hypoxemic respiratory failure with underlying COPD, emphysema seen on CT chest with atelectasis, pulmonary infiltrates in lower lobes due to community acquired pneumonia.  Initial concern for COVID given high risk exposure, no clear  evidence of that based on repeated swab testing.   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 Acute decompensated diastolic heart failure Tele Continue diuresis  Atrial fibrillation with RVR Tele Eliquis Diltiazem increased per primary team  Steroid induced hyperglycemia SSI Will taper when respiratory status improves  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  She wants to continue to try to get out of the hospital but she doesn't want to go on life support or receive CPR. I called her son Arlys John to discuss and he agreed.]   Labs   CBC: Recent Labs  Lab  09/26/20 0303 09/27/20 0311 09/28/20 0311 09/29/20 0311 09/30/20 0255  WBC 14.2* 15.1* 19.2* 17.6* 17.6*  NEUTROABS  --  13.4* 17.3* 15.7* 15.9*  HGB 15.8* 16.3* 17.6* 17.9* 18.1*  HCT 51.3* 50.2* 53.8* 53.6* 55.4*  MCV 102.6* 98.2 96.1 95.4 95.7  PLT 329 325 370 324 298    Basic Metabolic Panel: Recent Labs  Lab 09/26/20 0303 09/26/20 0903 09/27/20 0311 09/27/20 1824 09/28/20 0311 09/29/20 0311 09/30/20 0255  NA 142   < > 145 140 140 139 137  K 4.9   < > 4.2 3.8 3.8 3.9 4.2  CL 101   < > 96* 84* 88* 85* 84*  CO2 32   < > 41* 39* 39* 41* 43*  GLUCOSE 168*   < > 175* 198* 152* 147* 165*  BUN 37*   < > 38* 45* 46* 51* 45*  CREATININE 0.58   < > 0.57 0.82 0.70 0.85 0.67  CALCIUM 8.6*   < > 8.8* 8.8* 8.7* 8.6* 8.9  MG 2.8*  --  2.4  --  2.4 2.3 2.3  PHOS 3.6  --  4.3  --  4.5 4.3 3.9   < > = values in this interval not displayed.   GFR: Estimated Creatinine Clearance: 61 mL/min (by C-G formula based on SCr of 0.67 mg/dL). Recent Labs  Lab 09/24/20 0323 09/25/20 0447 09/27/20 0311 09/28/20 0311 09/29/20 0311 09/30/20 0255  PROCALCITON 0.23  --   --   --   --   --  WBC 16.5*   < > 15.1* 19.2* 17.6* 17.6*   < > = values in this interval not displayed.    Liver Function Tests: Recent Labs  Lab 09/27/20 0311 09/27/20 1824 09/28/20 0311 09/29/20 0311 09/30/20 0255  AST 35 42* 37 40 39  ALT 149* 146* 129* 112* 108*  ALKPHOS 75 89 78 68 65  BILITOT 0.7 1.1 0.8 1.0 1.2  PROT 6.4* 7.2 6.5 6.0* 6.1*  ALBUMIN 2.6* 3.0* 2.8* 2.7* 2.8*   No results for input(s): LIPASE, AMYLASE in the last 168 hours. No results for input(s): AMMONIA in the last 168 hours.  ABG    Component Value Date/Time   PHART 7.325 (L) 09/25/2020 0320   PCO2ART 67.6 (HH) 09/25/2020 0320   PO2ART 130 (H) 09/25/2020 0320   HCO3 34.2 (H) 09/25/2020 0320   O2SAT 98.6 09/25/2020 0320     Coagulation Profile: No results for input(s): INR, PROTIME in the last 168 hours.   Cardiac  Enzymes: No results for input(s): CKTOTAL, CKMB, CKMBINDEX, TROPONINI in the last 168 hours.   HbA1C: No results found for: HGBA1C  CBG: Recent Labs  Lab 09/27/20 1158  GLUCAP 183*    Critical care time:    The patient is critically ill with respiratory failure and high risk for deterioration.  Independent Critical Care Time: 45 Minutes.   Mechele Collin, M.D. [facility] Pulmonary/Critical Care Medicine 09/30/2020 10:36 AM   Please see Amion for pager number to reach on-call Pulmonary and Critical Care Team.

## 2020-09-30 NOTE — Plan of Care (Signed)
  Problem: Nutrition: Goal: Adequate nutrition will be maintained Outcome: Progressing   Problem: Pain Managment: Goal: General experience of comfort will improve Outcome: Progressing   

## 2020-09-30 NOTE — Progress Notes (Signed)
Pt transported from ICU to CT and back on bipap 80% fio2.  Pt tolerated transport well without incident.

## 2020-10-01 ENCOUNTER — Inpatient Hospital Stay (HOSPITAL_COMMUNITY): Payer: Medicare HMO

## 2020-10-01 DIAGNOSIS — J189 Pneumonia, unspecified organism: Secondary | ICD-10-CM | POA: Diagnosis not present

## 2020-10-01 DIAGNOSIS — I4891 Unspecified atrial fibrillation: Secondary | ICD-10-CM

## 2020-10-01 DIAGNOSIS — J9621 Acute and chronic respiratory failure with hypoxia: Secondary | ICD-10-CM | POA: Diagnosis not present

## 2020-10-01 LAB — URINALYSIS, ROUTINE W REFLEX MICROSCOPIC
Bilirubin Urine: NEGATIVE
Glucose, UA: NEGATIVE mg/dL
Ketones, ur: NEGATIVE mg/dL
Nitrite: NEGATIVE
Protein, ur: NEGATIVE mg/dL
RBC / HPF: >50 RBC/hpf — ABNORMAL HIGH (ref 0–5)
Specific Gravity, Urine: 1.01 (ref 1.005–1.030)
WBC, UA: >50 WBC/hpf — ABNORMAL HIGH (ref 0–5)
pH: 6 (ref 5.0–8.0)

## 2020-10-01 LAB — ECHOCARDIOGRAM LIMITED BUBBLE STUDY
AR max vel: 2.19 cm2
AV Area VTI: 2.39 cm2
AV Area mean vel: 1.79 cm2
AV Mean grad: 5 mmHg
AV Peak grad: 6.9 mmHg
Ao pk vel: 1.31 m/s
Area-P 1/2: 2.93 cm2
Height: 63 in
S' Lateral: 2.6 cm
Single Plane A4C EF: 60.7 %
Weight: 3206.4 oz

## 2020-10-01 LAB — BASIC METABOLIC PANEL
Anion gap: 12 (ref 5–15)
BUN: 47 mg/dL — ABNORMAL HIGH (ref 8–23)
CO2: 40 mmol/L — ABNORMAL HIGH (ref 22–32)
Calcium: 9 mg/dL (ref 8.9–10.3)
Chloride: 80 mmol/L — ABNORMAL LOW (ref 98–111)
Creatinine, Ser: 0.75 mg/dL (ref 0.44–1.00)
GFR, Estimated: >60 mL/min (ref >60)
Glucose, Bld: 172 mg/dL — ABNORMAL HIGH (ref 70–99)
Potassium: 4.1 mmol/L (ref 3.5–5.1)
Sodium: 132 mmol/L — ABNORMAL LOW (ref 135–145)

## 2020-10-01 LAB — CBC
HCT: 56 % — ABNORMAL HIGH (ref 36.0–46.0)
Hemoglobin: 18.5 g/dL — ABNORMAL HIGH (ref 12.0–15.0)
MCH: 31.4 pg (ref 26.0–34.0)
MCHC: 33 g/dL (ref 30.0–36.0)
MCV: 94.9 fL (ref 80.0–100.0)
Platelets: 270 10*3/uL (ref 150–400)
RBC: 5.9 MIL/uL — ABNORMAL HIGH (ref 3.87–5.11)
RDW: 13.2 % (ref 11.5–15.5)
WBC: 20.2 10*3/uL — ABNORMAL HIGH (ref 4.0–10.5)
nRBC: 0 % (ref 0.0–0.2)

## 2020-10-01 LAB — ANCA TITERS
Atypical P-ANCA titer: <1:20 {titer}
C-ANCA: <1:20 {titer}
P-ANCA: <1:20 {titer}

## 2020-10-01 LAB — CYCLIC CITRUL PEPTIDE ANTIBODY, IGG/IGA: CCP Antibodies IgG/IgA: 5 units (ref 0–19)

## 2020-10-01 MED ORDER — ORAL CARE MOUTH RINSE
15.0000 mL | Freq: Two times a day (BID) | OROMUCOSAL | Status: DC
  Administered 2020-10-01 – 2020-10-20 (×30): 15 mL via OROMUCOSAL

## 2020-10-01 MED ORDER — FUROSEMIDE 10 MG/ML IJ SOLN
40.0000 mg | Freq: Every day | INTRAMUSCULAR | Status: DC
  Administered 2020-10-02 – 2020-10-03 (×2): 40 mg via INTRAVENOUS
  Filled 2020-10-01 (×2): qty 4

## 2020-10-01 MED ORDER — CHLORHEXIDINE GLUCONATE 0.12 % MT SOLN
15.0000 mL | Freq: Two times a day (BID) | OROMUCOSAL | Status: DC
  Administered 2020-10-01 – 2020-10-19 (×31): 15 mL via OROMUCOSAL
  Filled 2020-10-01 (×32): qty 15

## 2020-10-01 MED ORDER — METHYLPREDNISOLONE SODIUM SUCC 125 MG IJ SOLR
50.0000 mg | INTRAMUSCULAR | Status: DC
  Administered 2020-10-02: 50 mg via INTRAVENOUS
  Filled 2020-10-01: qty 2

## 2020-10-01 MED ORDER — DILTIAZEM HCL ER COATED BEADS 120 MG PO CP24
240.0000 mg | ORAL_CAPSULE | Freq: Every day | ORAL | Status: DC
  Administered 2020-10-02 – 2020-10-07 (×6): 240 mg via ORAL
  Filled 2020-10-01 (×7): qty 2

## 2020-10-01 MED ORDER — DILTIAZEM HCL ER 60 MG PO CP12
60.0000 mg | ORAL_CAPSULE | Freq: Once | ORAL | Status: AC
  Administered 2020-10-01: 60 mg via ORAL
  Filled 2020-10-01: qty 1

## 2020-10-01 MED ORDER — SALINE SPRAY 0.65 % NA SOLN
1.0000 | NASAL | Status: DC | PRN
  Filled 2020-10-01: qty 44

## 2020-10-01 NOTE — Progress Notes (Signed)
Pt noted to have blood in urine at beginning of shift. Pt reported abd discomfort and the feeling of bladder fullness. Hematuria notably increased after movement in bed. MD notified and orders received. Bladder scan =42ml, completed while urology in room.  RN attempted to ambulate pt. Pt sat on the side of the bed and reported dizziness. RN gave pt 5 minutes to sit. Pt reported she could not tolerate to stand and requested to lay back down. RN attempted to stand pt and she could not find the strength to stand at bedside. Pt returned to laying in bed. Dizziness gradually resolved. RN will continue to monitor.

## 2020-10-01 NOTE — Progress Notes (Signed)
OT Cancellation Note  Patient Details Name: Kimberly Ballard MRN: [id number] DOB: 04-21-1940   Cancelled Treatment:    Reason Eval/Treat Not Completed: Other (comment) Patient sleeping upon arrival, reports feeling too tired to do therapy at this time. Will check back 9/1 as schedule permits.  [name] OT OT pager: [phone]   [name] 10/01/2020, 2:05 PM

## 2020-10-01 NOTE — Progress Notes (Addendum)
PROGRESS NOTE    Kimberly Ballard  LGX:[id number] DOB: 1941-01-30 DOA: 09/22/2020 PCP: System, Provider Not In   Brief Narrative:  This 80 years old female with PMH significant for COPD presented in the ED with worsening shortness of breath, decreased appetite and coughing.  Patient's daughter was diagnosed with COVID last week . she was started on paxloid empirically due to the risk of exposure but patient continued to get worse and developed shortness of breath.  In the ED she was requiring 6 L of oxygen. CT chest ruled out PE but shows evidence of bilateral infiltrates.  Patient was started on Rocephin and Zithromax for community-acquired pneumonia.  Subsequently she decompensated and worsened and went into A. fib with RVR and was placed on BiPAP and admitted in the stepdown.  Cardiology and pulmonology was consulted.  Patient was started on Cardizem drip and heparin gtt,  now heart rate is controlled,  transitioned to p.o. Cardizem.  She is weaned down to 15 L nonrebreather.  She was started on heparin and now transition to Eliquis.  Cardiology recommended continue Lasix for adequate diuresis. Respiratory status is slowly improving and after goals of care discussion with pulmonology she was made DNR.  Patient has developed hematuria urology was consulted recommended to treat UTI and outpatient follow-up.  Assessment & Plan:   Active Problems:   CAP (community acquired pneumonia)   Acute respiratory failure with hypoxia and hypercapnia (HCC)   Sepsis (HCC)   Elevated LFTs   Cardiomegaly   Elevated troponin   Atrial fibrillation (HCC)   Acute and chronic respiratory failure (acute-on-chronic) (HCC)   Acute hypoxic respiratory failure requiring noninvasive positive pressure ventilation with BiPAP: This could be multifactorial from underlying multifocal pneumonia and diastolic CHF. CTA chest ruled out PE, showed patchy consolidative opacities consistent with multifocal pneumonia. Completed  antibiotics for 5 days for CAP (ceftriaxone and Zithromax) Blood cultures no growth so far. Continued Lasix 80 mg twice daily as per cardio. Continue supplemental oxygen, remains on high flow oxygen 30L/min. Continue BiPAP at night and as needed. Pulmonology feels that this is clinically COVID given that hypoxemia and hypercapnia however now her isolation precautions have been discontinued. Continue nebulization with Brovana/Pulmicort/Yupelri. Continue IV Solu-Medrol.  Continue Xopenex every 4 hours for wheezing as needed. Lasix reduced to 40 mg IV daily, Solu-Medrol reduced to 50 mg daily Pulmonology recommend maintaining in ICU, mobilizing getting up and out of bed.  Atrial fibrillation with RVR: Heart rate remains uncontrolled.   Patient was initially started on Cardizem and heparin then transitioned to Eliquis and Cardizem p.o. Heart rate remains uncontrolled , cardiology has increased Cardizem. Continue Eliquis.  Acute diastolic CHF: Echo :LVEF 55 to 60%.  Continues to have bilateral crackles on exam. Cardiology recommended continuing diuresis with Lasix 80 mg twice daily. Strict intake/ output charting, daily weight. She is 11.1 L negative balance.  AKI: > Resolved. Presented with serum creatinine 1.28. Resolved, serum creatinine 0.85.  Elevated troponins: Likely demand ischemia.   EKG no significant T wave changes. Cardiology following.  Elevated LFTs: This could be due to passive liver congestion from CHF. LFTs trending down,  continue to monitor.  Right middle lobe nodule: Needs repeat CT chest in 3 months.  Hematuria: Urology consulted, no intervention needed. Treat UTI if confirmed.  Consider Ditropan for bladder spasm.   If she continues to have hematuria consider cystoscopy   DVT prophylaxis: Eliquis Code Status: DNR Family Communication: No family at bed side. Disposition Plan:  Status is: Inpatient  Remains inpatient appropriate because:Inpatient  level of care appropriate due to severity of illness  Dispo: The patient is from: Home              Anticipated d/c is to: SNF              Patient currently is not medically stable to d/c.   Difficult to place patient No  Consultants:  Pulmonology and cardiology Urology  Procedures:  Antimicrobials:   Anti-infectives (From admission, onward)    Start     Dose/Rate Route Frequency Ordered Stop   09/23/20 1800  cefTRIAXone (ROCEPHIN) 2 g in sodium chloride 0.9 % 100 mL IVPB        2 g 200 mL/hr over 30 Minutes Intravenous Every 24 hours 09/22/20 1842 09/29/20 1749   09/23/20 1800  azithromycin (ZITHROMAX) 500 mg in sodium chloride 0.9 % 250 mL IVPB  Status:  Discontinued        500 mg 250 mL/hr over 60 Minutes Intravenous Every 24 hours 09/22/20 1842 09/25/20 1456   09/22/20 1630  cefTRIAXone (ROCEPHIN) 1 g in sodium chloride 0.9 % 100 mL IVPB        1 g 200 mL/hr over 30 Minutes Intravenous  Once 09/22/20 1618 09/22/20 1806   09/22/20 1630  azithromycin (ZITHROMAX) 500 mg in sodium chloride 0.9 % 250 mL IVPB        500 mg 250 mL/hr over 60 Minutes Intravenous  Once 09/22/20 1618 09/22/20 1939        Subjective: Patient was seen and examined at bedside.  Overnight events noted.   She still remains on high flow nasal cannula 30 L but lying comfortably.  Lung sounds clear, heart rate remains in A. fib in 130s.  Objective: Vitals:   10/01/20 1000 10/01/20 1127 10/01/20 1200 10/01/20 1325  BP: 127/87  116/83 121/90  Pulse: 91  (!) 41   Resp: (!) 26  (!) 28   Temp:   98 F (36.7 C)   TempSrc:   Oral   SpO2: (!) 88% 92% (!) 85%   Weight:      Height:        Intake/Output Summary (Last 24 hours) at 10/01/2020 1413 Last data filed at 10/01/2020 1200 Gross per 24 hour  Intake 1370.44 ml  Output 3000 ml  Net -1629.56 ml   Filed Weights   09/22/20 2020  Weight: 90.9 kg    Examination:  General exam: Appears chronically ill, and deconditioned, not in any acute  distress.  On high flow nasal cannula 30 L Respiratory system: Clear to auscultation bilaterally, respiratory effort normal, respiratory rate 17 Cardiovascular system: S1-S2 heard, irregular rate and rhythm, no murmur, Gastrointestinal system: Abdomen is nondistended, soft and nontender. No organomegaly or masses felt. Normal bowel sounds heard. Central nervous system: Alert and oriented. No focal neurological deficits. Extremities: No edema, no cyanosis, no clubbing. Skin: No rashes, lesions or ulcers Psychiatry: Judgement and insight appear normal. Mood & affect appropriate.     Data Reviewed: I have personally reviewed following labs and imaging studies  CBC: Recent Labs  Lab 09/27/20 0311 09/28/20 0311 09/29/20 0311 09/30/20 0255 10/01/20 0249  WBC 15.1* 19.2* 17.6* 17.6* 20.2*  NEUTROABS 13.4* 17.3* 15.7* 15.9*  --   HGB 16.3* 17.6* 17.9* 18.1* 18.5*  HCT 50.2* 53.8* 53.6* 55.4* 56.0*  MCV 98.2 96.1 95.4 95.7 94.9  PLT 325 370 324 298 270   Basic Metabolic Panel: Recent Labs  Lab 09/26/20 0303 09/26/20 9604 09/27/20 0311 09/27/20 1824 09/28/20 0311 09/29/20 0311 09/30/20 0255 10/01/20 0249  NA 142   < > 145 140 140 139 137 132*  K 4.9   < > 4.2 3.8 3.8 3.9 4.2 4.1  CL 101   < > 96* 84* 88* 85* 84* 80*  CO2 32   < > 41* 39* 39* 41* 43* 40*  GLUCOSE 168*   < > 175* 198* 152* 147* 165* 172*  BUN 37*   < > 38* 45* 46* 51* 45* 47*  CREATININE 0.58   < > 0.57 0.82 0.70 0.85 0.67 0.75  CALCIUM 8.6*   < > 8.8* 8.8* 8.7* 8.6* 8.9 9.0  MG 2.8*  --  2.4  --  2.4 2.3 2.3  --   PHOS 3.6  --  4.3  --  4.5 4.3 3.9  --    < > = values in this interval not displayed.   GFR: Estimated Creatinine Clearance: 61 mL/min (by C-G formula based on SCr of 0.75 mg/dL). Liver Function Tests: Recent Labs  Lab 09/27/20 0311 09/27/20 1824 09/28/20 0311 09/29/20 0311 09/30/20 0255  AST 35 42* 37 40 39  ALT 149* 146* 129* 112* 108*  ALKPHOS 75 89 78 68 65  BILITOT 0.7 1.1 0.8 1.0  1.2  PROT 6.4* 7.2 6.5 6.0* 6.1*  ALBUMIN 2.6* 3.0* 2.8* 2.7* 2.8*   Recent Labs  Lab 09/30/20 0250  LIPASE 54*   No results for input(s): AMMONIA in the last 168 hours. Coagulation Profile: No results for input(s): INR, PROTIME in the last 168 hours. Cardiac Enzymes: No results for input(s): CKTOTAL, CKMB, CKMBINDEX, TROPONINI in the last 168 hours. BNP (last 3 results) No results for input(s): PROBNP in the last 8760 hours. HbA1C: No results for input(s): HGBA1C in the last 72 hours. CBG: Recent Labs  Lab 09/27/20 1158  GLUCAP 183*   Lipid Profile: No results for input(s): CHOL, HDL, LDLCALC, TRIG, CHOLHDL, LDLDIRECT in the last 72 hours. Thyroid Function Tests: No results for input(s): TSH, T4TOTAL, FREET4, T3FREE, THYROIDAB in the last 72 hours. Anemia Panel: No results for input(s): VITAMINB12, FOLATE, FERRITIN, TIBC, IRON, RETICCTPCT in the last 72 hours. Sepsis Labs: No results for input(s): PROCALCITON, LATICACIDVEN in the last 168 hours.  Recent Results (from the past 240 hour(s))  Resp Panel by RT-PCR (Flu A&B, Covid) Nasopharyngeal Swab     Status: None   Collection Time: 09/22/20  2:41 PM   Specimen: Nasopharyngeal Swab; Nasopharyngeal(NP) swabs in vial transport medium  Result Value Ref Range Status   SARS Coronavirus 2 by RT PCR NEGATIVE NEGATIVE Final    Comment: (NOTE) SARS-CoV-2 target nucleic acids are NOT DETECTED.  The SARS-CoV-2 RNA is generally detectable in upper respiratory specimens during the acute phase of infection. The lowest concentration of SARS-CoV-2 viral copies this assay can detect is 138 copies/mL. A negative result does not preclude SARS-Cov-2 infection and should not be used as the sole basis for treatment or other patient management decisions. A negative result may occur with  improper specimen collection/handling, submission of specimen other than nasopharyngeal swab, presence of viral mutation(s) within the areas targeted by  this assay, and inadequate number of viral copies(<138 copies/mL). A negative result must be combined with clinical observations, patient history, and epidemiological information. The expected result is Negative.  Fact Sheet for Patients:  BloggerCourse.com  Fact Sheet for Healthcare Providers:  SeriousBroker.it  This test is no t yet approved or cleared  by the [place] FDA and  has been authorized for detection and/or diagnosis of SARS-CoV-2 by FDA under an Emergency Use Authorization (EUA). This EUA will remain  in effect (meaning this test can be used) for the duration of the COVID-19 declaration under Section 564(b)(1) of the Act, 21 U.S.C.section 360bbb-3(b)(1), unless the authorization is terminated  or revoked sooner.       Influenza A by PCR NEGATIVE NEGATIVE Final   Influenza B by PCR NEGATIVE NEGATIVE Final    Comment: (NOTE) The Xpert Xpress SARS-CoV-2/FLU/RSV plus assay is intended as an aid in the diagnosis of influenza from Nasopharyngeal swab specimens and should not be used as a sole basis for treatment. Nasal washings and aspirates are unacceptable for Xpert Xpress SARS-CoV-2/FLU/RSV testing.  Fact Sheet for Patients: BloggerCourse.comhttps://www.fda.gov/media/152166/download  Fact Sheet for Healthcare Providers: SeriousBroker.ithttps://www.fda.gov/media/152162/download  This test is not yet approved or cleared by the [place] FDA and has been authorized for detection and/or diagnosis of SARS-CoV-2 by FDA under an Emergency Use Authorization (EUA). This EUA will remain in effect (meaning this test can be used) for the duration of the COVID-19 declaration under Section 564(b)(1) of the Act, 21 U.S.C. section 360bbb-3(b)(1), unless the authorization is terminated or revoked.  Performed at [facility], [address] [place], [place] [address]   Blood Culture (routine x 2)     Status: None   Collection Time:  09/22/20  2:41 PM   Specimen: BLOOD  Result Value Ref Range Status   Specimen Description   Final    BLOOD BLOOD LEFT FOREARM Performed at [facility], [address] [place], [place] [address]    Special Requests   Final    BOTTLES DRAWN AEROBIC AND ANAEROBIC Blood Culture adequate volume Performed at [facility], [address] [place], [place] [address]    Culture   Final    NO GROWTH 5 DAYS Performed at [facility], [address] [place], [place] [address]    Report Status 09/27/2020 FINAL  Final  Blood Culture (routine x 2)     Status: None   Collection Time: 09/22/20  5:20 PM   Specimen: BLOOD LEFT HAND  Result Value Ref Range Status   Specimen Description   Final    BLOOD LEFT HAND Performed at [facility], [address] [place], [place] [address]    Special Requests   Final    BOTTLES DRAWN AEROBIC AND ANAEROBIC Blood Culture results may not be optimal due to an excessive volume of blood received in culture bottles Performed at [facility], [address] [place], [place] [address]    Culture   Final    NO GROWTH 5 DAYS Performed at [facility], [address] [place], [place] [address]    Report Status 09/27/2020 FINAL  Final  Respiratory (~20 pathogens) panel by PCR     Status: None   Collection Time: 09/22/20 10:47 PM   Specimen: Nasopharyngeal Swab; Respiratory  Result Value Ref Range Status   Adenovirus NOT DETECTED NOT DETECTED Final   Coronavirus 229E NOT DETECTED NOT DETECTED Final    Comment: (NOTE) The Coronavirus on the Respiratory Panel, DOES NOT test for the novel  Coronavirus (2019 nCoV)    Coronavirus HKU1 NOT DETECTED NOT DETECTED Final   Coronavirus NL63 NOT DETECTED NOT DETECTED Final   Coronavirus OC43 NOT DETECTED NOT DETECTED Final   Metapneumovirus NOT DETECTED NOT DETECTED  Final   Rhinovirus / Enterovirus NOT DETECTED NOT DETECTED Final   Influenza A  NOT DETECTED NOT DETECTED Final   Influenza B NOT DETECTED NOT DETECTED Final   Parainfluenza Virus 1 NOT DETECTED NOT DETECTED Final   Parainfluenza Virus 2 NOT DETECTED NOT DETECTED Final   Parainfluenza Virus 3 NOT DETECTED NOT DETECTED Final   Parainfluenza Virus 4 NOT DETECTED NOT DETECTED Final   Respiratory Syncytial Virus NOT DETECTED NOT DETECTED Final   Bordetella pertussis NOT DETECTED NOT DETECTED Final   Bordetella Parapertussis NOT DETECTED NOT DETECTED Final   Chlamydophila pneumoniae NOT DETECTED NOT DETECTED Final   Mycoplasma pneumoniae NOT DETECTED NOT DETECTED Final    Comment: Performed at [facility], [address] [place], [place] [address]  MRSA Next Gen by PCR, Nasal     Status: None   Collection Time: 09/24/20  5:24 AM   Specimen: Nasal Mucosa; Nasal Swab  Result Value Ref Range Status   MRSA by PCR Next Gen NOT DETECTED NOT DETECTED Final    Comment: (NOTE) The GeneXpert MRSA Assay (FDA approved for NASAL specimens only), is one component of a comprehensive MRSA colonization surveillance program. It is not intended to diagnose MRSA infection nor to guide or monitor treatment for MRSA infections. Test performance is not FDA approved in patients less than 37 years old. Performed at [facility], [address] [place], [place] [address]   Resp Panel by RT-PCR (Flu A&B, Covid) Nasopharyngeal Swab     Status: None   Collection Time: 09/24/20  2:21 PM   Specimen: Nasopharyngeal Swab; Nasopharyngeal(NP) swabs in vial transport medium  Result Value Ref Range Status   SARS Coronavirus 2 by RT PCR NEGATIVE NEGATIVE Final    Comment: (NOTE) SARS-CoV-2 target nucleic acids are NOT DETECTED.  The SARS-CoV-2 RNA is generally detectable in upper respiratory specimens during the acute phase of infection. The lowest concentration of SARS-CoV-2 viral copies this assay can detect is 138 copies/mL. A negative result does not preclude  SARS-Cov-2 infection and should not be used as the sole basis for treatment or other patient management decisions. A negative result may occur with  improper specimen collection/handling, submission of specimen other than nasopharyngeal swab, presence of viral mutation(s) within the areas targeted by this assay, and inadequate number of viral copies(<138 copies/mL). A negative result must be combined with clinical observations, patient history, and epidemiological information. The expected result is Negative.  Fact Sheet for Patients:  BloggerCourse.com  Fact Sheet for Healthcare Providers:  SeriousBroker.it  This test is no t yet approved or cleared by the [place] FDA and  has been authorized for detection and/or diagnosis of SARS-CoV-2 by FDA under an Emergency Use Authorization (EUA). This EUA will remain  in effect (meaning this test can be used) for the duration of the COVID-19 declaration under Section 564(b)(1) of the Act, 21 U.S.C.section 360bbb-3(b)(1), unless the authorization is terminated  or revoked sooner.       Influenza A by PCR NEGATIVE NEGATIVE Final   Influenza B by PCR NEGATIVE NEGATIVE Final    Comment: (NOTE) The Xpert Xpress SARS-CoV-2/FLU/RSV plus assay is intended as an aid in the diagnosis of influenza from Nasopharyngeal swab specimens and should not be used as a sole basis for treatment. Nasal washings and aspirates are unacceptable for Xpert Xpress SARS-CoV-2/FLU/RSV testing.  Fact Sheet for Patients: BloggerCourse.com  Fact Sheet for Healthcare Providers: SeriousBroker.it  This test is not yet approved or cleared  by the [place] and has been authorized for detection and/or diagnosis of SARS-CoV-2 by FDA under an Emergency Use Authorization (EUA). This EUA will remain in effect (meaning this test can be used) for the duration of  the COVID-19 declaration under Section 564(b)(1) of the Act, 21 U.S.C. section 360bbb-3(b)(1), unless the authorization is terminated or revoked.  Performed at [facility], [address], [place], [place] [address]     Radiology Studies: DG Abd 1 View  Result Date: 09/30/2020 CLINICAL DATA:  Abdominal distension EXAM: ABDOMEN - 1 VIEW COMPARISON:  None. FINDINGS: Nonobstructive bowel gas pattern. Cholecystectomy clips. Mild degenerative changes of the lower lumbar spine. IMPRESSION: Negative. Electronically Signed   By: Charline Bills M.D.   On: 09/30/2020 20:20   CT ABDOMEN PELVIS W CONTRAST  Result Date: 09/30/2020 CLINICAL DATA:  Abdominal pain, leukocytosis EXAM: CT ABDOMEN AND PELVIS WITH CONTRAST TECHNIQUE: Multidetector CT imaging of the abdomen and pelvis was performed using the standard protocol following bolus administration of intravenous contrast. CONTRAST:  32mL OMNIPAQUE IOHEXOL 350 MG/ML SOLN COMPARISON:  Right upper quadrant ultrasound dated 09/23/2020. FINDINGS: Lower chest: Patchy bilateral lower lobe opacities, atelectasis versus pneumonia. Hepatobiliary: Mildly macronodular hepatic contour. Status post cholecystectomy. No intrahepatic or extrahepatic ductal dilatation. Pancreas: Within normal limits. No peripancreatic fluid/inflammatory changes. Spleen: Within normal limits. Adrenals/Urinary Tract: Adrenal glands are within normal limits. 9 mm left upper pole renal calculus (series 2/image 22). Right kidney is within normal limits. No hydronephrosis. Bladder is decompressed by an indwelling Foley catheter with nondependent gas. Stomach/Bowel: Stomach is within normal limits. No evidence of bowel obstruction. Appendix is not discretely visualized. Sigmoid diverticulosis, without evidence of diverticulitis. Vascular/Lymphatic: No evidence of abdominal aortic aneurysm. Atherosclerotic calcifications of the abdominal aorta and branch vessels. No suspicious  abdominopelvic lymphadenopathy. Reproductive: Status post hysterectomy. No adnexal masses. Other: No abdominopelvic ascites. Musculoskeletal: Mild degenerative changes of the visualized thoracolumbar spine. IMPRESSION: Patchy bilateral lower lobe opacities, atelectasis versus pneumonia. Sigmoid diverticulosis, without evidence of diverticulitis. No peripancreatic inflammatory changes on CT. Status post cholecystectomy and hysterectomy. Appendix is not discretely visualized. Electronically Signed   By: Charline Bills M.D.   On: 09/30/2020 23:59   DG CHEST PORT 1 VIEW  Result Date: 09/29/2020 CLINICAL DATA:  Shortness of breath EXAM: PORTABLE CHEST 1 VIEW COMPARISON:  09/29/2020 at 1706 hours FINDINGS: Mild patchy right lower lobe opacity. Pulmonary vascular congestion with mild interstitial edema. Small bilateral pleural effusions. Cardiomegaly. IMPRESSION: Cardiomegaly with mild interstitial edema and small bilateral pleural effusions. Superimposed mild patchy right lower lobe opacity, unchanged, atelectasis versus pneumonia. Electronically Signed   By: Charline Bills M.D.   On: 09/29/2020 21:46    Scheduled Meds:  apixaban  5 mg Oral BID   arformoterol  15 mcg Nebulization BID   budesonide (PULMICORT) nebulizer solution  0.5 mg Nebulization BID   chlorhexidine  15 mL Mouth Rinse BID   Chlorhexidine Gluconate Cloth  6 each Topical Daily   [START ON 10/02/2020] diltiazem  240 mg Oral Daily   [START ON 10/02/2020] furosemide  40 mg Intravenous Daily   guaiFENesin  600 mg Oral BID   mouth rinse  15 mL Mouth Rinse q12n4p   [START ON 10/02/2020] methylPREDNISolone (SOLU-MEDROL) injection  50 mg Intravenous Q24H   polyethylene glycol  17 g Oral BID   revefenacin  175 mcg Nebulization Daily   Continuous Infusions:  sodium chloride Stopped (09/29/20 1824)     LOS: 9 days    Time spent:  35 mins.    Cipriano Bunker, MD [facility]   If 7PM-7AM, please contact night-coverage

## 2020-10-01 NOTE — TOC Progression Note (Signed)
Transition of Care [facility]) - Progression Note    Patient Details  Name: Kimberly Ballard MRN: [id number] Date of Birth: [birth year]  Transition [facility]) CM/SW Contact  Golda Acre, RN Phone Number: 10/01/2020, 7:46 AM  Clinical Narrative:    09/29/2020 her heart rates were uncontrolled and she went back in A. fib with RVR.  She was given a dose of IV metoprolol and her po  Cardizem was increased.  Her respiratory status remains tenuous so we will continue diuresis and continue steroids.  PT OT recommending SNF. On 09/30/20 her HR remain elevated so will give another dose of IV Metoprolol 5 mg and if continues to sustain will re-consult Cardiology and likely place on a Cardizem gtt. she is also complaining of mid abdominal pain to obtain CT scan of the abdomen pelvis as well as a KUB as well as adding a lipase level on   Assessment & Plan:   Active Problems:   CAP (community acquired pneumonia)   Acute respiratory failure with hypoxia and hypercapnia (HCC)   Sepsis (HCC)   Elevated LFTs   Cardiomegaly   Elevated troponin   Atrial fibrillation (HCC)   Acute and chronic respiratory failure (acute-on-chronic) (HCC)   Acute respiratory failure with hypoxia requiring noninvasive positive pressure ventilation with BiPAP 12/6 60% FiO2 -Multifactorial from underlying multifocal pneumonia, diastolic CHF -Patient is currently on Rocephin and Zithromax and will continue -Follow blood culture results -She is given IV Lasix 40 mg daily for last 3 days but now she will be getting IV 80 mg twice daily per pulmonary recommendations -CTA of the Chest PE protocol done and showed "No evidence of acute pulmonary embolism. Patchy consolidative opacities in the lung bases and  lingula have slightly worsened compared to the prior study and remain concerning for multifocal pneumonia, with differential including COVID. As before, follow-up chest CT in 3 months is recommended to assess for resolution.  Unchanged cardiomegaly with evidence of right heart failure and pulmonary hypertension as above. Coronary artery calcifications and Aortic Atherosclerosis (ICD10-I70.0). -SpO2: (!) 87 % O2 Flow Rate (L/min): 30 L/min FiO2 (%): 80 % ; was on 15 L nonrebreather and now oxygen requirement is improving -Inflammatory Markers  Recent Labs (last 2 labs)   No results for input(s): DDIMER, FERRITIN, LDH, CRP in the last 72 hours.  -ABG Labs (Brief)          Component Value Date/Time    PHART 7.325 (L) 09/25/2020 0320    PCO2ART 67.6 (HH) 09/25/2020 0320    PO2ART 130 (H) 09/25/2020 0320    HCO3 34.2 (H) 09/25/2020 0320    O2SAT 98.6 09/25/2020 0320      Recent Labs       Lab Results  Component Value Date    SARSCOV2NAA NEGATIVE 09/24/2020    SARSCOV2NAA NEGATIVE 09/22/2020      -Leukocytosis was resolving but now slightly worsened in the setting of steroid demargination and is now improving again.  WBC is now gone from 19.2 is now 17.6 -> 17.6 again -Speech therapy consulted for swallow evaluation -Appreciate PCCM consult and they placed the patient on BiPAP but has now been weaned to 15 L; pulmonary feels that this is clinically COVID given that hypoxemia and hypercapnia however now her isolation precautions have been discontinued TOC PLAN OF CARE: SNF PLACEMENT  Expected Discharge Plan: Home/Self Care Barriers to Discharge: Continued Medical Work up  Expected Discharge Plan and Services Expected Discharge Plan: Home/Self Care   Discharge  Planning Services: CM Consult   Living arrangements for the past 2 months: Single Family Home                                       Social Determinants of Health (SDOH) Interventions    Readmission Risk Interventions No flowsheet data found.

## 2020-10-01 NOTE — Progress Notes (Signed)
NAME:  Kimberly Ballard, MRN:  [id number], DOB:  03/14/1940, LOS: 9 ADMISSION DATE:  09/22/2020, CONSULTATION DATE:  8/23 REFERRING MD:  Adela Glimpse, CHIEF COMPLAINT:  Dyspnea, cough   History of Present Illness:  80 y/o femal presented to the [facility] ED with dyspnea, body aches, cough on 8/22 after a significant COVID exposure.  Her daughter had COVID and the patient developed symptoms around 8/16.  She discussed with her PCP and a paxlovid Rx was send on 8/18.  At [facility] she has had 2 negative tests for COVID (both PCR), but her IgG antibody was also negative. Per PCP notes she has received the primary series COVID vaccine as well as 2 boosters.    Pertinent  Medical History  COPD Atrial fibrillation Former cigarette smoker  Significant Hospital Events: Including procedures, antibiotic start and stop dates in addition to other pertinent events   8/22 > admit.  Admitted to stepdown unit.  Cough weak.  Pulmonary asked to evaluate given concern for progressive respiratory failure.  COVID-19 and influenza negative. Respiratory viral panel negative.   IV ceftriaxone and azithromycin initiated.  Given IV Solu-Medrol followed by oral prednisone order. 8/23 pulmonary asked to evaluate given worsening respiratory failure.  CT chest showing lower lobe consolidation.  There was concern about possible aspiration.  Echo obtained showed good left ventricular function with mild pulmonary artery hypertension. RUQ ultrasound showed hepatic steatosis, possibly cirrhosis based on nodular liver contour, no gallbladder 8/24 cardiology consulted for elevated troponins.  This was felt secondary to worsening hypoxia and underlying pulmonary artery hypertension which was felt previously undiagnosed issue.  New onset atrial fibrillation with RVR.  CT angiogram ordered.  Increased oxygen requirement overnight, requiring BiPAP, later placed on 15 L/min via high flow.  Lower extremity ultrasound negative for DVT 8/25: u strep neg.  still  high FIO2. F/u COVID neg. Transitioned to oral CCB and DOAC. Getting IV lasix. Transitioned to headed High flow [place] during day and BIPAP at night.  8/26 still w/ overall net positive fluid balance over hospital stay. CXR w/ worse edema. Lasix increased to 80 mg q 12. Azithromycin stopped (day 5) Steroids reduced.  Cardiology signed off. (See note from cards w/ plan to follow as out-pt)  8/27 diuresed, CXR better, eating, got up to chair 8/28-8/31 Remains on FIO2 80% despite aggressive diuresis. Net neg 11L  Interim History / Subjective:  Remains on FIO2 80% despite aggressive diuresis. Net neg 11L Feels weak and short of breath,  unchanged  Objective   Blood pressure 127/87, pulse 91, temperature (!) 96.3 F (35.7 C), temperature source Axillary, resp. rate (!) 26, height 5\' 3"  (1.6 m), weight 90.9 kg, SpO2 (!) 88 %.    FiO2 (%):  [70 %-80 %] 80 %   Intake/Output Summary (Last 24 hours) at 10/01/2020 1115 Last data filed at 10/01/2020 1000 Gross per 24 hour  Intake 1250.44 ml  Output 2325 ml  Net -1074.56 ml   Filed Weights   09/22/20 2020  Weight: 90.9 kg   Physical Exam: General: Chronically ill-appearing, no acute distress HENT: [place], AT, OP clear, MMM, heated high flow nasal cannula in place Eyes: EOMI, no scleral icterus Respiratory: Clear to auscultation bilaterally.  No crackles, wheezing or rales Cardiovascular:  Irregular rate or rhythm, -M/R/G, no JVD GI: BS+, soft, nontender Extremities:-Edema,-tenderness Neuro: AAO x3, CNII-XII grossly intact  Stable Cr with diuresis Increasing leucocytosis   Resolved Hospital Problem list     Assessment & Plan:  Multifactorial Acute  on chronic hypercapnic and hypoxemic respiratory failure with underlying COPD, emphysema seen on CT chest with atelectasis, pulmonary infiltrates in lower lobes due to community acquired pneumonia. 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 She has been adequately diuresed, completed antibiotics and steroids have not mitigated her O2 requirement. Autoimmune work-up negative except for slightly elevated SSB IgG ab which is non-specific. Currently diagnosis is this may be a slow resolving pneumonia +/- pulmonary/cardiac shunt. Plan: Maintain in ICU Continue to wean FIO2 and flow for goal SpO2 >85% with tolerating of saturations >75% with movement. 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 Acute decompensated diastolic heart failure Tele Continue diuresis  Atrial fibrillation with RVR Tele Eliquis Diltiazem increased per primary team Cardiology following  Steroid induced hyperglycemia SSI Will taper when respiratory status improves  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  She wants to continue to try to get out of the hospital but she doesn't want  to go on life support or receive CPR. I called her son Arlys John to discuss and he agreed.]   Labs   CBC: Recent Labs  Lab 09/27/20 0311 09/28/20 0311 09/29/20 0311 09/30/20 0255 10/01/20 0249  WBC 15.1* 19.2* 17.6* 17.6* 20.2*  NEUTROABS 13.4* 17.3* 15.7* 15.9*  --   HGB 16.3* 17.6* 17.9* 18.1* 18.5*  HCT 50.2* 53.8* 53.6* 55.4* 56.0*  MCV 98.2 96.1 95.4 95.7 94.9  PLT 325 370 324 298 270    Basic Metabolic Panel: Recent Labs  Lab 09/26/20 0303 09/26/20 0903 09/27/20 0311 09/27/20 1824 09/28/20 0311 09/29/20 0311 09/30/20 0255 10/01/20 0249  NA 142   < > 145 140 140 139 137 132*  K 4.9   < > 4.2 3.8 3.8 3.9 4.2 4.1  CL 101   < > 96* 84* 88* 85* 84* 80*  CO2 32   < > 41* 39* 39* 41* 43* 40*  GLUCOSE 168*   < > 175* 198* 152* 147* 165* 172*  BUN 37*   < > 38* 45* 46* 51* 45* 47*  CREATININE 0.58   < > 0.57 0.82 0.70 0.85 0.67 0.75  CALCIUM 8.6*   < > 8.8* 8.8* 8.7* 8.6* 8.9 9.0  MG 2.8*  --  2.4  --  2.4 2.3 2.3  --   PHOS 3.6  --  4.3  --  4.5 4.3 3.9  --    < > = values in this interval not displayed.   GFR: Estimated Creatinine Clearance: 61  mL/min (by C-G formula based on SCr of 0.75 mg/dL). Recent Labs  Lab 09/28/20 0311 09/29/20 0311 09/30/20 0255 10/01/20 0249  WBC 19.2* 17.6* 17.6* 20.2*    Liver Function Tests: Recent Labs  Lab 09/27/20 0311 09/27/20 1824 09/28/20 0311 09/29/20 0311 09/30/20 0255  AST 35 42* 37 40 39  ALT 149* 146* 129* 112* 108*  ALKPHOS 75 89 78 68 65  BILITOT 0.7 1.1 0.8 1.0 1.2  PROT 6.4* 7.2 6.5 6.0* 6.1*  ALBUMIN 2.6* 3.0* 2.8* 2.7* 2.8*   Recent Labs  Lab 09/30/20 0250  LIPASE 54*   No results for input(s): AMMONIA in the last 168 hours.  ABG    Component Value Date/Time   PHART 7.325 (L) 09/25/2020 0320   PCO2ART 67.6 (HH) 09/25/2020 0320   PO2ART 130 (H) 09/25/2020 0320   HCO3 34.2 (H) 09/25/2020 0320   O2SAT 98.6 09/25/2020 0320     Coagulation Profile: No results for input(s): INR, PROTIME in the  last 168 hours.   Cardiac Enzymes: No results for input(s): CKTOTAL, CKMB, CKMBINDEX, TROPONINI in the last 168 hours.   HbA1C: No results found for: HGBA1C  CBG: Recent Labs  Lab 09/27/20 1158  GLUCAP 183*    Critical care time:    The patient is critically ill with multiple organ systems failure and requires high complexity decision making for assessment and support, frequent evaluation and titration of therapies, application of advanced monitoring technologies and extensive interpretation of multiple databases.  Independent Critical Care Time: 39 Minutes.   Mechele Collin, M.D. [facility] Pulmonary/Critical Care Medicine 10/01/2020 11:15 AM   Please see Amion for pager number to reach on-call Pulmonary and Critical Care Team.

## 2020-10-01 NOTE — Progress Notes (Addendum)
Progress Note  Patient Name: Kimberly Ballard Date of Encounter: 10/01/2020  [facility] Cardiologist: Dr. Mayford Knife  Subjective   Cardiology asked to see again due to return of RVR. Patient's respiratory status remains tenuous and Pulmonology is following. She is still on high flow nasal cannula. Intermittent BiPAP use.Heart rates were in the 80s to 90s overnight but increased to as high as the 140s to 150s this morning. When I was in the room, rates were in the 100s to 110s at rest but increase to the 140s with minimal activity such as shifting in the bed. Unaware of this. No palpitations. No chest pain. She feels like her breathing is the same - no significant improvement.  Inpatient Medications    Scheduled Meds:  apixaban  5 mg Oral BID   arformoterol  15 mcg Nebulization BID   budesonide (PULMICORT) nebulizer solution  0.5 mg Nebulization BID   chlorhexidine  15 mL Mouth Rinse BID   Chlorhexidine Gluconate Cloth  6 each Topical Daily   diltiazem  180 mg Oral Daily   furosemide  80 mg Intravenous BID   guaiFENesin  600 mg Oral BID   mouth rinse  15 mL Mouth Rinse q12n4p   methylPREDNISolone (SOLU-MEDROL) injection  40 mg Intravenous Q12H   polyethylene glycol  17 g Oral BID   revefenacin  175 mcg Nebulization Daily   Continuous Infusions:  sodium chloride Stopped (09/29/20 1824)   PRN Meds: sodium chloride, levalbuterol, senna-docusate, simethicone   Vital Signs    Vitals:   10/01/20 0400 10/01/20 0500 10/01/20 0600 10/01/20 0800  BP: 112/71 (!) 132/95 117/78   Pulse: 69 82 76 100  Resp: 20 19 19  (!) 24  Temp:    (!) 96.3 F (35.7 C)  TempSrc:    Axillary  SpO2: 96% 95% 94% 94%  Weight:      Height:        Intake/Output Summary (Last 24 hours) at 10/01/2020 1011 Last data filed at 10/01/2020 0800 Gross per 24 hour  Intake 1010.44 ml  Output 2245 ml  Net -1234.56 ml   Last 3 Weights 09/22/2020  Weight (lbs) 200 lb 6.4 oz  Weight (kg) 90.901 kg       Telemetry    Atrial fibrillation with rates in the 80s to 90s overnight but increased to 100s to 150s this morning. In the 100s to 110s at rest but quickly increases to the 140s with minimal activity. - Personally Reviewed  ECG    No new EKG tracing today. - Personally Reviewed  Physical Exam   GEN: Ill appearing Caucasian female. No acute distress.   Neck: No JVD. Cardiac: tachycardic with irregularly irregular rhythm. No murmurs, rubs, or gallops. Radial and distal pedal pulses 2+ and equal bilaterally. Respiratory: On supplemental O2 with high flow nasal cannula. Decreased breath sounds bilaterally but no signifcant wheezes, rhonchi, or rales appreciated. GI: Soft, non-distended, and non-tender. MS: No lower extremity edema. No deformity. Skin Warm and dry. Neuro:  No focal deficits. Psych: Normal affect.  Labs    High Sensitivity Troponin:   Recent Labs  Lab 09/22/20 2148 09/23/20 0207 09/24/20 0239  TROPONINIHS 128* 100* 52*      Chemistry Recent Labs  Lab 09/28/20 0311 09/29/20 0311 09/30/20 0255 10/01/20 0249  NA 140 139 137 132*  K 3.8 3.9 4.2 4.1  CL 88* 85* 84* 80*  CO2 39* 41* 43* 40*  GLUCOSE 152* 147* 165* 172*  BUN 46* 51* 45* 47*  CREATININE 0.70 0.85 0.67 0.75  CALCIUM 8.7* 8.6* 8.9 9.0  PROT 6.5 6.0* 6.1*  --   ALBUMIN 2.8* 2.7* 2.8*  --   AST 37 40 39  --   ALT 129* 112* 108*  --   ALKPHOS 78 68 65  --   BILITOT 0.8 1.0 1.2  --   GFRNONAA >60 >60 >60 >60  ANIONGAP 13 13 10 12      Hematology Recent Labs  Lab 09/29/20 0311 09/30/20 0255 10/01/20 0249  WBC 17.6* 17.6* 20.2*  RBC 5.62* 5.79* 5.90*  HGB 17.9* 18.1* 18.5*  HCT 53.6* 55.4* 56.0*  MCV 95.4 95.7 94.9  MCH 31.9 31.3 31.4  MCHC 33.4 32.7 33.0  RDW 13.4 13.4 13.2  PLT 324 298 270    BNP Recent Labs  Lab 09/26/20 0303  BNP 964.7*     DDimer No results for input(s): DDIMER in the last 168 hours.   Radiology    DG Abd 1 View  Result Date: 09/30/2020 CLINICAL  DATA:  Abdominal distension EXAM: ABDOMEN - 1 VIEW COMPARISON:  None. FINDINGS: Nonobstructive bowel gas pattern. Cholecystectomy clips. Mild degenerative changes of the lower lumbar spine. IMPRESSION: Negative. Electronically Signed   By: 10/02/2020 M.D.   On: 09/30/2020 20:20   CT ABDOMEN PELVIS W CONTRAST  Result Date: 09/30/2020 CLINICAL DATA:  Abdominal pain, leukocytosis EXAM: CT ABDOMEN AND PELVIS WITH CONTRAST TECHNIQUE: Multidetector CT imaging of the abdomen and pelvis was performed using the standard protocol following bolus administration of intravenous contrast. CONTRAST:  88mL OMNIPAQUE IOHEXOL 350 MG/ML SOLN COMPARISON:  Right upper quadrant ultrasound dated 09/23/2020. FINDINGS: Lower chest: Patchy bilateral lower lobe opacities, atelectasis versus pneumonia. Hepatobiliary: Mildly macronodular hepatic contour. Status post cholecystectomy. No intrahepatic or extrahepatic ductal dilatation. Pancreas: Within normal limits. No peripancreatic fluid/inflammatory changes. Spleen: Within normal limits. Adrenals/Urinary Tract: Adrenal glands are within normal limits. 9 mm left upper pole renal calculus (series 2/image 22). Right kidney is within normal limits. No hydronephrosis. Bladder is decompressed by an indwelling Foley catheter with nondependent gas. Stomach/Bowel: Stomach is within normal limits. No evidence of bowel obstruction. Appendix is not discretely visualized. Sigmoid diverticulosis, without evidence of diverticulitis. Vascular/Lymphatic: No evidence of abdominal aortic aneurysm. Atherosclerotic calcifications of the abdominal aorta and branch vessels. No suspicious abdominopelvic lymphadenopathy. Reproductive: Status post hysterectomy. No adnexal masses. Other: No abdominopelvic ascites. Musculoskeletal: Mild degenerative changes of the visualized thoracolumbar spine. IMPRESSION: Patchy bilateral lower lobe opacities, atelectasis versus pneumonia. Sigmoid diverticulosis, without  evidence of diverticulitis. No peripancreatic inflammatory changes on CT. Status post cholecystectomy and hysterectomy. Appendix is not discretely visualized. Electronically Signed   By: 09/25/2020 M.D.   On: 09/30/2020 23:59   DG CHEST PORT 1 VIEW  Result Date: 09/29/2020 CLINICAL DATA:  Shortness of breath EXAM: PORTABLE CHEST 1 VIEW COMPARISON:  09/29/2020 at 1706 hours FINDINGS: Mild patchy right lower lobe opacity. Pulmonary vascular congestion with mild interstitial edema. Small bilateral pleural effusions. Cardiomegaly. IMPRESSION: Cardiomegaly with mild interstitial edema and small bilateral pleural effusions. Superimposed mild patchy right lower lobe opacity, unchanged, atelectasis versus pneumonia. Electronically Signed   By: 10/01/2020 M.D.   On: 09/29/2020 21:46    Cardiac Studies   Complete Echo 09/23/2020: Impressions:  1. Left ventricular ejection fraction, by estimation, is 55 to 60%. Left  ventricular ejection fraction by 3D volume is 59 %. The left ventricle has  normal function. The left ventricle has no regional wall motion  abnormalities. There is moderate left  ventricular hypertrophy. Left ventricular diastolic parameters are  consistent with Grade I diastolic dysfunction (impaired relaxation).   2. Right ventricular systolic function mildly reduced at base and mid,  with relative preserved apical systolic function. The right ventricular  size is moderately enlarged. There is moderately elevated pulmonary artery  systolic pressure. The estimated  right ventricular systolic pressure is 48.4 mmHg.   3. Left atrial size was mildly dilated.   4. Right atrial size was moderately dilated.   5. The mitral valve is normal in structure. Trivial mitral valve  regurgitation. No evidence of mitral stenosis.   6. Tricuspid valve regurgitation is mild to moderate.   7. The aortic valve is normal in structure. Aortic valve regurgitation is  not visualized. No aortic  stenosis is present.   8. The inferior vena cava is dilated in size with <50% respiratory  variability, suggesting right atrial pressure of 15 mmHg.   Patient Profile     80 y.o. female with a history of COPD with prior tobacco use (quit in 2019) who was admitted on 09/22/2020 for acute hypoxic respiratory failure secondary to COPD exacerbation and multifocal pneumonia after presenting with shortness of breath, body aches, cough, and decreased appetite. Negative for COVID this admission. Cardiology consulted for elevated troponin. Did alter develop atrial fibrillation with RVR and was started on Cardizem and Eliquis with control of rates but not restoration of sinus rhythm Cardiology signed off on 09/26/2020 with plans to have patient follow-up in the A. Fib Clinc and likely DCCV after 304 weeks of anticoagulation. We were reconsulted today given recurrent RVR.  Assessment & Plan    New Onset Atrial Fibrillation with RVR - In the setting of acute hypoxic respiratory failure secondary to COPD exacerbation and pneumonia. Initially rate controlled on PO Cardizem but rates have been elevated the last couple of days. Rates in the 80s to 90s overnight but ranging from the 100s to 140s this morning (100s to 110s at rest but increase quickly to the 140s with minimal activity). - Electrolytes and TSH OK. - Echo as below. - Currently on Cardizem CD 180mg  daily. Also receiving PRN IV Lopressor. - I still think elevated rates is ultimately due to respiratory status. We have BP room so will increase Cardizem CD to 240mg  daily starting tomorrow. Can given an additional 60mg  today since she has already had her 180mg  dose. - Will hold off on PO beta-blocker given acute COPD exacerbation. - Continue Eliquis 5mg  twice daily. - Can still plan for outpatient DCCV once she recovers from acute illness.  Acute Diastolic CHF with Cor Pulmonale RV Dysfunction/Pulmonary Hypertension - BNP 889 >> 964. - Most recent chest  x-ray on 09/29/2020 showed cardiomegaly with mild interstitial edema and small bilateral pleural effusion with superimposed mild patchy right lower lobe opacity (unchanged). - Echo showed LVEF of 59% but moderately enlarged RV with mildly reduced systolic function at the base and mid segments with relatively preserved apical systolic function. This can be seen in acute PE  but chest CTA negative for this. - Currently on IV Lasix 80mg  twice daily. Net negative 11.2 L this admission. No udpated weights since admission. Renal function stable. - Does not appear significantly volume overloaded on exam but can continue currently dose of IV Lasix since she is tolerating this well and given she has limited reserve with her acute COPD and pneumonia. - RV dysfunction felt to be secondary to pulmonary hypertension. This is felt to be chronic but worsened in the  setting of acute COPD exacerbation and pneumonia.   Elevated Troponin - High-sensitivity troponin 12 >> 100 >> 52. Net negative 11.2 L this admission. - EKG showed T wave abnormality in anterior leads. No prior tracing for comparison. - Echo as above. - Troponin elevation not felt to be ACS and more consistent with demand. She does have coronary calcification on CT so may benefit from outpatient Lexiscan once she recovers from acute illness.   Otherwise, per primary team: - COPD Exacerbation - Multifocal pneumonia  - Abdominal pain - Transamnitis    For questions or updates, please contact [facility] Please consult [email or web address] for contact info under        Signed, Corrin Parker, PA-C  10/01/2020, 10:11 AM    Patient seen and examined.  Agree with above documentation.  On exam, patient is alert, normal rate, irregular, no murmurs, diminished breath sounds, no lower extremity edema.  Telemetry shows A. fib up to rate 140s earlier today but currently in 80s to 100s.  Cardizem increased to 240 mg daily.  Little Ishikawa, MD

## 2020-10-01 NOTE — Consult Note (Addendum)
Urology Consult Note   Requesting Attending Physician:  Cipriano Bunker, MD Service Providing Consult: Urology  Consulting Attending: Dr. Modena Slater   Reason for Consult:  Gross hematuria  HPI: Kimberly Ballard is seen in consultation for reasons noted above at the request of Cipriano Bunker, MD for evaluation of gross hematuria.  This is a 80 y.o. female with Hx of COPD admitted for multifocal pneumonia in the setting of a significant COVID exposure (notably COVID negative on testing) as well as new onset Afib w/ RVR now on Eliquis. She had a Foley catheter placed on 09/26/20 for her critical illness.   She has had the development of gross hematuria over the past 24 hours w/ associated occasional suprapubic discomfort for which Urology was consulted.   She denies a history of gross hematuria, recurrent UTI's, nephrolithasis, GU malignancies.   She does have a history of smoking, and she things that her maternal grandfather may have had bladder cancer.   Past Medical History: Past Medical History:  Diagnosis Date   COPD (chronic obstructive pulmonary disease) (HCC)     Past Surgical History:  History reviewed. No pertinent surgical history.  Medication: Current Facility-Administered Medications  Medication Dose Route Frequency Provider Last Rate Last Admin   0.9 %  sodium chloride infusion   Intravenous PRN Marguerita Merles Latif, DO   Stopped at 09/29/20 1824   apixaban (ELIQUIS) tablet 5 mg  5 mg Oral BID Quintella Reichert, MD   5 mg at 10/01/20 0956   arformoterol (BROVANA) nebulizer solution 15 mcg  15 mcg Nebulization BID Max Fickle B, MD   15 mcg at 10/01/20 0750   budesonide (PULMICORT) nebulizer solution 0.5 mg  0.5 mg Nebulization BID Max Fickle B, MD   0.5 mg at 10/01/20 0750   chlorhexidine (PERIDEX) 0.12 % solution 15 mL  15 mL Mouth Rinse BID Cipriano Bunker, MD   15 mL at 10/01/20 0957   Chlorhexidine Gluconate Cloth 2 % PADS 6 each  6 each Topical Daily Meredeth Ide, MD   6 each at 10/01/20 0957   [START ON 10/02/2020] diltiazem (CARDIZEM CD) 24 hr capsule 240 mg  240 mg Oral Daily Marjie Skiff E, PA-C       diltiazem (CARDIZEM SR) 12 hr capsule 60 mg  60 mg Oral Once Corrin Parker, PA-C       [START ON 10/02/2020] furosemide (LASIX) injection 40 mg  40 mg Intravenous Daily Luciano Cutter, MD       guaiFENesin (MUCINEX) 12 hr tablet 600 mg  600 mg Oral BID Therisa Doyne, MD   600 mg at 10/01/20 0955   levalbuterol (XOPENEX) nebulizer solution 1.25 mg  1.25 mg Nebulization Q4H PRN Lupita Leash, MD       MEDLINE mouth rinse  15 mL Mouth Rinse q12n4p Cipriano Bunker, MD   15 mL at 10/01/20 1214   [START ON 10/02/2020] methylPREDNISolone sodium succinate (SOLU-MEDROL) 125 mg/2 mL injection 50 mg  50 mg Intravenous Q24H Luciano Cutter, MD       polyethylene glycol (MIRALAX / GLYCOLAX) packet 17 g  17 g Oral BID Marguerita Merles Petoskey, DO   17 g at 10/01/20 7741   revefenacin (YUPELRI) nebulizer solution 175 mcg  175 mcg Nebulization Daily Max Fickle B, MD   175 mcg at 10/01/20 0750   senna-docusate (Senokot-S) tablet 1 tablet  1 tablet Oral QHS PRN Charissa Bash, NP   1 tablet at 09/27/20  2111   simethicone (MYLICON) chewable tablet 80 mg  80 mg Oral QID PRN Marguerita Merles Latif, DO   80 mg at 09/29/20 1715    Allergies: No Known Allergies  Social History: Social History   Tobacco Use   Smoking status: Former    Types: Cigarettes   Smokeless tobacco: Never  Vaping Use   Vaping Use: Never used  Substance Use Topics   Alcohol use: Yes    Alcohol/week: 2.0 standard drinks    Types: 2 Glasses of wine per week   Drug use: Never    Family History Family History  Problem Relation Age of Onset   Diabetes Neg Hx    CAD Neg Hx     Review of Systems 10 systems were reviewed and are negative except as noted specifically in the HPI.  Objective   Vital signs in last 24 hours: BP 116/83   Pulse (!) 41   Temp 98 F  (36.7 C) (Oral)   Resp (!) 28   Ht 5\' 3"  (1.6 m)   Wt 90.9 kg   SpO2 (!) 85%   BMI 35.50 kg/m   Physical Exam General: NAD, A&O, resting, appropriate HEENT: [place]/AT, EOMI, MMM Pulmonary: Normal work of breathing on nasal cannula Cardiovascular: Tachycardic, irregular rhythm, adequate peripheral perfusion Abdomen: Soft, NTTP, nondistended. GU: Foley catheter in place draining light pink clear urine, no CVA tenderness Extremities: warm and well perfused Neuro: Appropriate, no focal neurological deficits  Most Recent Labs: Lab Results  Component Value Date   WBC 20.2 (H) 10/01/2020   HGB 18.5 (H) 10/01/2020   HCT 56.0 (H) 10/01/2020   PLT 270 10/01/2020    Lab Results  Component Value Date   NA 132 (L) 10/01/2020   K 4.1 10/01/2020   CL 80 (L) 10/01/2020   CO2 40 (H) 10/01/2020   BUN 47 (H) 10/01/2020   CREATININE 0.75 10/01/2020   CALCIUM 9.0 10/01/2020   MG 2.3 09/30/2020   PHOS 3.9 09/30/2020    Lab Results  Component Value Date   INR 1.3 (H) 09/22/2020    IMAGING: DG Abd 1 View  Result Date: 09/30/2020 CLINICAL DATA:  Abdominal distension EXAM: ABDOMEN - 1 VIEW COMPARISON:  None. FINDINGS: Nonobstructive bowel gas pattern. Cholecystectomy clips. Mild degenerative changes of the lower lumbar spine. IMPRESSION: Negative. Electronically Signed   By: 10/02/2020 M.D.   On: 09/30/2020 20:20   CT ABDOMEN PELVIS W CONTRAST  Result Date: 09/30/2020 CLINICAL DATA:  Abdominal pain, leukocytosis EXAM: CT ABDOMEN AND PELVIS WITH CONTRAST TECHNIQUE: Multidetector CT imaging of the abdomen and pelvis was performed using the standard protocol following bolus administration of intravenous contrast. CONTRAST:  7mL OMNIPAQUE IOHEXOL 350 MG/ML SOLN COMPARISON:  Right upper quadrant ultrasound dated 09/23/2020. FINDINGS: Lower chest: Patchy bilateral lower lobe opacities, atelectasis versus pneumonia. Hepatobiliary: Mildly macronodular hepatic contour. Status post  cholecystectomy. No intrahepatic or extrahepatic ductal dilatation. Pancreas: Within normal limits. No peripancreatic fluid/inflammatory changes. Spleen: Within normal limits. Adrenals/Urinary Tract: Adrenal glands are within normal limits. 9 mm left upper pole renal calculus (series 2/image 22). Right kidney is within normal limits. No hydronephrosis. Bladder is decompressed by an indwelling Foley catheter with nondependent gas. Stomach/Bowel: Stomach is within normal limits. No evidence of bowel obstruction. Appendix is not discretely visualized. Sigmoid diverticulosis, without evidence of diverticulitis. Vascular/Lymphatic: No evidence of abdominal aortic aneurysm. Atherosclerotic calcifications of the abdominal aorta and branch vessels. No suspicious abdominopelvic lymphadenopathy. Reproductive: Status post hysterectomy. No adnexal masses. Other: No  abdominopelvic ascites. Musculoskeletal: Mild degenerative changes of the visualized thoracolumbar spine. IMPRESSION: Patchy bilateral lower lobe opacities, atelectasis versus pneumonia. Sigmoid diverticulosis, without evidence of diverticulitis. No peripancreatic inflammatory changes on CT. Status post cholecystectomy and hysterectomy. Appendix is not discretely visualized. Electronically Signed   By: Charline Bills M.D.   On: 09/30/2020 23:59   DG CHEST PORT 1 VIEW  Result Date: 09/29/2020 CLINICAL DATA:  Shortness of breath EXAM: PORTABLE CHEST 1 VIEW COMPARISON:  09/29/2020 at 1706 hours FINDINGS: Mild patchy right lower lobe opacity. Pulmonary vascular congestion with mild interstitial edema. Small bilateral pleural effusions. Cardiomegaly. IMPRESSION: Cardiomegaly with mild interstitial edema and small bilateral pleural effusions. Superimposed mild patchy right lower lobe opacity, unchanged, atelectasis versus pneumonia. Electronically Signed   By: Charline Bills M.D.   On: 09/29/2020 21:46    ------  Assessment:  80 y.o. female with Hx of COPD  admitted for multifocal pneumonia in the setting of a significant COVID exposure (notably COVID negative on testing) as well as new onset Afib w/ RVR now on Eliquis w/ new onset hematuria and suprapubic discomfort w/ indwelling catheter.   Her catheter flushes back and forth easily and bladder scan is zero. Her hematuria is very mild and needs no acute intervention. Discussed possible etiologies of hematuria including UTI, catheter irritation, nephrolithiasis, or GU malignancy.    Agree with UTI workup (UA w/ > 50 WBCs, small leukocytes, negative nitrites) and treatment if confirmed.   If no UTI, discomfort could be related to bladder spasms from catheter irritation. Would recommend anticholinergics (Ditropan 5 mg TID) for bladder relaxation.  Otherwise further workup for gross/ hematuria can be performed as an outpatient. Recommendations: - No acute Urologic intervention needed - Follow up Urine culture results. Treat UTI if confirmed.  - Consider anticholinergic (Ditropan 5 mg TID PRN) for bladder spasms if no infection is present.  - Recommend outpatient Urologic follow up once out of the hospital and acute illness resolved. If persistent hematuria, will consider further workup with cystoscopy, especially considering history of smoking and possible family history of GU cancer.    Thank you for this consult. Please contact the urology consult pager with any further questions/concerns.  Reola Mosher, MD [facility] Urology Resident, PGY4 [facility]   Patient seen and examined today 9/2 after experiencing more hematuria. Foley switched out to 49F 3 way and CBI started. Will continue CBI and wean as tolerated

## 2020-10-02 DIAGNOSIS — J9621 Acute and chronic respiratory failure with hypoxia: Secondary | ICD-10-CM | POA: Diagnosis not present

## 2020-10-02 DIAGNOSIS — I4891 Unspecified atrial fibrillation: Secondary | ICD-10-CM | POA: Diagnosis not present

## 2020-10-02 LAB — BASIC METABOLIC PANEL
Anion gap: 12 (ref 5–15)
BUN: 41 mg/dL — ABNORMAL HIGH (ref 8–23)
CO2: 42 mmol/L — ABNORMAL HIGH (ref 22–32)
Calcium: 9 mg/dL (ref 8.9–10.3)
Chloride: 81 mmol/L — ABNORMAL LOW (ref 98–111)
Creatinine, Ser: 0.7 mg/dL (ref 0.44–1.00)
GFR, Estimated: >60 mL/min (ref >60)
Glucose, Bld: 137 mg/dL — ABNORMAL HIGH (ref 70–99)
Potassium: 4.2 mmol/L (ref 3.5–5.1)
Sodium: 135 mmol/L (ref 135–145)

## 2020-10-02 LAB — URINE CULTURE: Culture: 40000 — AB

## 2020-10-02 LAB — BLOOD GAS, ARTERIAL
Acid-Base Excess: 19 mmol/L — ABNORMAL HIGH (ref 0.0–2.0)
Bicarbonate: 47 mmol/L — ABNORMAL HIGH (ref 20.0–28.0)
FIO2: 80
O2 Content: 30 L/min
O2 Saturation: 94.9 %
Patient temperature: 98.6
pCO2 arterial: 56.9 mmHg — ABNORMAL HIGH (ref 32.0–48.0)
pH, Arterial: 7.527 — ABNORMAL HIGH (ref 7.350–7.450)
pO2, Arterial: 76.3 mmHg — ABNORMAL LOW (ref 83.0–108.0)

## 2020-10-02 MED ORDER — PREDNISONE 20 MG PO TABS: 20.0000 mg | ORAL_TABLET | Freq: Every day | ORAL | Status: DC

## 2020-10-02 MED ORDER — PREDNISONE 10 MG PO TABS: 10.0000 mg | ORAL_TABLET | Freq: Every day | ORAL | Status: DC

## 2020-10-02 MED ORDER — PREDNISONE 20 MG PO TABS: 40.0000 mg | ORAL_TABLET | Freq: Every day | ORAL | Status: DC

## 2020-10-02 MED ORDER — PREDNISONE 20 MG PO TABS: 30.0000 mg | ORAL_TABLET | Freq: Every day | ORAL | Status: DC

## 2020-10-02 MED ORDER — PREDNISONE 20 MG PO TABS
50.0000 mg | ORAL_TABLET | Freq: Every day | ORAL | Status: DC
  Administered 2020-10-03: 50 mg via ORAL
  Filled 2020-10-02: qty 2

## 2020-10-02 NOTE — Progress Notes (Signed)
Pt continued to report bladder fullness and foley continued to have bloody urine. MD notified of symptoms and leaking present around catheter. Foley removed per MD. Purewick placed and will reassess for first void. Rn will continue to monitor.

## 2020-10-02 NOTE — Progress Notes (Signed)
PROGRESS NOTE    Kimberly Ballard  CXK:[id number] DOB: 01-26-1941 DOA: 09/22/2020 PCP: System, Provider Not In   Brief Narrative:  This 80 years old female with PMH significant for COPD presented in the ED with worsening shortness of breath, decreased appetite and coughing.  Patient's daughter was diagnosed with COVID last week . she was started on paxloid empirically due to the risk of exposure but patient continued to get worse and developed shortness of breath.  In the ED she was requiring 6 L of oxygen. CT chest ruled out PE but shows evidence of bilateral infiltrates.  Patient was started on Rocephin and Zithromax for community-acquired pneumonia.  Subsequently she decompensated and worsened and went into A. fib with RVR and was placed on BiPAP and admitted in the stepdown.  Cardiology and pulmonology was consulted.  Patient was started on Cardizem drip and heparin gtt,  now heart rate is controlled,  transitioned to p.o. Cardizem.  She is weaned down to 15 L nonrebreather.  She was started on heparin and now transition to Eliquis.  Cardiology recommended continue Lasix for adequate diuresis. Respiratory status is slowly improving and after goals of care discussion with pulmonology she was made DNR.  Patient has developed hematuria , urology was consulted recommended to treat UTI and outpatient follow-up.  Assessment & Plan:   Active Problems:   CAP (community acquired pneumonia)   Acute respiratory failure with hypoxia and hypercapnia (HCC)   Sepsis (HCC)   Elevated LFTs   Cardiomegaly   Elevated troponin   Atrial fibrillation with RVR (HCC)   Acute and chronic respiratory failure (acute-on-chronic) (HCC)   Acute hypoxic respiratory failure requiring noninvasive positive pressure ventilation with BiPAP: This could be multifactorial from underlying multifocal pneumonia and acute diastolic CHF. CTA chest ruled out PE, showed patchy consolidative opacities consistent with multifocal  pneumonia. Completed antibiotics for 5 days for CAP (ceftriaxone and Zithromax) Blood cultures no growth so far. Continued Lasix 80 mg twice daily as per cardio. Continue supplemental oxygen, remains on high flow oxygen 30L/min. Continue BiPAP at night and as needed. Pulmonology feels that this is clinically COVID given that hypoxemia and hypercapnia however now her isolation precautions have been discontinued. Covid negative x 2 Continue nebulization with Brovana/Pulmicort/Yupelri. Continue IV Solu-Medrol.  Continue Xopenex every 4 hours for wheezing as needed. Lasix reduced to 40 mg IV daily, Solu-Medrol reduced to 50 mg daily Pulmonology recommend maintaining in ICU, mobilizing getting up and out of bed.  Atrial fibrillation with RVR: Heart rate now controlled. Continue Eliquis and Cardizem.  Acute diastolic CHF: Echo: LVEF 55 to 60%.  Continued to have bilateral crackles on exam. Cardiology recommended continuing diuresis with Lasix 80 mg twice daily. Strict intake/ output charting, daily weight. She is 11.1 L negative balance. Weight down 5 Lbs since admission. Reduce Lasix 40 mg daily, she is well diuresed.   AKI: > Resolved. Presented with serum creatinine 1.28. Resolved.  Elevated troponins: Likely demand ischemia.   EKG no significant T wave changes. Cardiology following.  Elevated LFTs: This could be due to passive liver congestion from CHF. LFTs trending down,  continue to monitor.  Right middle lobe nodule: Needs repeat CT chest in 3 months.  Hematuria: Urology consulted, no intervention needed. Treat UTI if confirmed.  Consider Ditropan for bladder spasm.   If she continues to have hematuria consider cystoscopy.   DVT prophylaxis: Eliquis Code Status: DNR Family Communication: No family at bed side. Disposition Plan:   Status is: Inpatient  Remains inpatient appropriate because:Inpatient level of care appropriate due to severity of illness  Dispo:  The patient is from: Home              Anticipated d/c is to: SNF              Patient currently is not medically stable to d/c.   Difficult to place patient No  Consultants:  Pulmonology and cardiology Urology  Procedures:  Antimicrobials:   Anti-infectives (From admission, onward)    Start     Dose/Rate Route Frequency Ordered Stop   09/23/20 1800  cefTRIAXone (ROCEPHIN) 2 g in sodium chloride 0.9 % 100 mL IVPB        2 g 200 mL/hr over 30 Minutes Intravenous Every 24 hours 09/22/20 1842 09/29/20 1749   09/23/20 1800  azithromycin (ZITHROMAX) 500 mg in sodium chloride 0.9 % 250 mL IVPB  Status:  Discontinued        500 mg 250 mL/hr over 60 Minutes Intravenous Every 24 hours 09/22/20 1842 09/25/20 1456   09/22/20 1630  cefTRIAXone (ROCEPHIN) 1 g in sodium chloride 0.9 % 100 mL IVPB        1 g 200 mL/hr over 30 Minutes Intravenous  Once 09/22/20 1618 09/22/20 1806   09/22/20 1630  azithromycin (ZITHROMAX) 500 mg in sodium chloride 0.9 % 250 mL IVPB        500 mg 250 mL/hr over 60 Minutes Intravenous  Once 09/22/20 1618 09/22/20 1939        Subjective: Patient was seen and examined at bedside.  Overnight events noted. Remains on high flow oxygen via nasal cannula.  She is alert, arousable following commands.  Heart rate remains controlled but remains in A. fib.  Urine is getting clearer. Objective: Vitals:   10/02/20 0821 10/02/20 0832 10/02/20 1000 10/02/20 1200  BP:   117/78 119/86  Pulse:  84 90 (!) 105  Resp:  18 (!) 29 19  Temp:      TempSrc:      SpO2:  93% 90% 92%  Weight: 88.2 kg     Height:        Intake/Output Summary (Last 24 hours) at 10/02/2020 1356 Last data filed at 10/02/2020 1100 Gross per 24 hour  Intake 240 ml  Output 905 ml  Net -665 ml   Filed Weights   09/22/20 2020 10/02/20 0821  Weight: 90.9 kg 88.2 kg    Examination:  General exam: Appears chronically ill, and deconditioned, not in any acute distress.  Still remains on high flow nasal  cannula. Respiratory system: Clear to auscultation bilaterally, respiratory effort normal, respiratory rate 17 Cardiovascular system: S1-S2 heard, irregular rate and rhythm, no murmur, Gastrointestinal system: Abdomen is nondistended, soft and nontender. No organomegaly or masses felt. Normal bowel sounds heard. Central nervous system: Alert and oriented x 2. No focal neurological deficits. Extremities: No edema, no cyanosis, no clubbing. Skin: No rashes, lesions or ulcers Psychiatry: Judgement and insight appear normal. Mood & affect appropriate.     Data Reviewed: I have personally reviewed following labs and imaging studies  CBC: Recent Labs  Lab 09/27/20 0311 09/28/20 0311 09/29/20 0311 09/30/20 0255 10/01/20 0249  WBC 15.1* 19.2* 17.6* 17.6* 20.2*  NEUTROABS 13.4* 17.3* 15.7* 15.9*  --   HGB 16.3* 17.6* 17.9* 18.1* 18.5*  HCT 50.2* 53.8* 53.6* 55.4* 56.0*  MCV 98.2 96.1 95.4 95.7 94.9  PLT 325 370 324 298 270   Basic Metabolic Panel: Recent Labs  Lab  09/26/20 0303 09/26/20 1610 09/27/20 9604 09/27/20 1824 09/28/20 0311 09/29/20 0311 09/30/20 0255 10/01/20 0249 10/02/20 0310  NA 142   < > 145   < > 140 139 137 132* 135  K 4.9   < > 4.2   < > 3.8 3.9 4.2 4.1 4.2  CL 101   < > 96*   < > 88* 85* 84* 80* 81*  CO2 32   < > 41*   < > 39* 41* 43* 40* 42*  GLUCOSE 168*   < > 175*   < > 152* 147* 165* 172* 137*  BUN 37*   < > 38*   < > 46* 51* 45* 47* 41*  CREATININE 0.58   < > 0.57   < > 0.70 0.85 0.67 0.75 0.70  CALCIUM 8.6*   < > 8.8*   < > 8.7* 8.6* 8.9 9.0 9.0  MG 2.8*  --  2.4  --  2.4 2.3 2.3  --   --   PHOS 3.6  --  4.3  --  4.5 4.3 3.9  --   --    < > = values in this interval not displayed.   GFR: Estimated Creatinine Clearance: 60 mL/min (by C-G formula based on SCr of 0.7 mg/dL). Liver Function Tests: Recent Labs  Lab 09/27/20 0311 09/27/20 1824 09/28/20 0311 09/29/20 0311 09/30/20 0255  AST 35 42* 37 40 39  ALT 149* 146* 129* 112* 108*  ALKPHOS  75 89 78 68 65  BILITOT 0.7 1.1 0.8 1.0 1.2  PROT 6.4* 7.2 6.5 6.0* 6.1*  ALBUMIN 2.6* 3.0* 2.8* 2.7* 2.8*   Recent Labs  Lab 09/30/20 0250  LIPASE 54*   No results for input(s): AMMONIA in the last 168 hours. Coagulation Profile: No results for input(s): INR, PROTIME in the last 168 hours. Cardiac Enzymes: No results for input(s): CKTOTAL, CKMB, CKMBINDEX, TROPONINI in the last 168 hours. BNP (last 3 results) No results for input(s): PROBNP in the last 8760 hours. HbA1C: No results for input(s): HGBA1C in the last 72 hours. CBG: Recent Labs  Lab 09/27/20 1158  GLUCAP 183*   Lipid Profile: No results for input(s): CHOL, HDL, LDLCALC, TRIG, CHOLHDL, LDLDIRECT in the last 72 hours. Thyroid Function Tests: No results for input(s): TSH, T4TOTAL, FREET4, T3FREE, THYROIDAB in the last 72 hours. Anemia Panel: No results for input(s): VITAMINB12, FOLATE, FERRITIN, TIBC, IRON, RETICCTPCT in the last 72 hours. Sepsis Labs: No results for input(s): PROCALCITON, LATICACIDVEN in the last 168 hours.  Recent Results (from the past 240 hour(s))  Resp Panel by RT-PCR (Flu A&B, Covid) Nasopharyngeal Swab     Status: None   Collection Time: 09/22/20  2:41 PM   Specimen: Nasopharyngeal Swab; Nasopharyngeal(NP) swabs in vial transport medium  Result Value Ref Range Status   SARS Coronavirus 2 by RT PCR NEGATIVE NEGATIVE Final    Comment: (NOTE) SARS-CoV-2 target nucleic acids are NOT DETECTED.  The SARS-CoV-2 RNA is generally detectable in upper respiratory specimens during the acute phase of infection. The lowest concentration of SARS-CoV-2 viral copies this assay can detect is 138 copies/mL. A negative result does not preclude SARS-Cov-2 infection and should not be used as the sole basis for treatment or other patient management decisions. A negative result may occur with  improper specimen collection/handling, submission of specimen other than nasopharyngeal swab, presence of viral  mutation(s) within the areas targeted by this assay, and inadequate number of viral copies(<138 copies/mL). A negative result must be  combined with clinical observations, patient history, and epidemiological information. The expected result is Negative.  Fact Sheet for Patients:  BloggerCourse.com  Fact Sheet for Healthcare Providers:  SeriousBroker.it  This test is no t yet approved or cleared by the [place] FDA and  has been authorized for detection and/or diagnosis of SARS-CoV-2 by FDA under an Emergency Use Authorization (EUA). This EUA will remain  in effect (meaning this test can be used) for the duration of the COVID-19 declaration under Section 564(b)(1) of the Act, 21 U.S.C.section 360bbb-3(b)(1), unless the authorization is terminated  or revoked sooner.       Influenza A by PCR NEGATIVE NEGATIVE Final   Influenza B by PCR NEGATIVE NEGATIVE Final    Comment: (NOTE) The Xpert Xpress SARS-CoV-2/FLU/RSV plus assay is intended as an aid in the diagnosis of influenza from Nasopharyngeal swab specimens and should not be used as a sole basis for treatment. Nasal washings and aspirates are unacceptable for Xpert Xpress SARS-CoV-2/FLU/RSV testing.  Fact Sheet for Patients: BloggerCourse.com  Fact Sheet for Healthcare Providers: SeriousBroker.it  This test is not yet approved or cleared by the [place] FDA and has been authorized for detection and/or diagnosis of SARS-CoV-2 by FDA under an Emergency Use Authorization (EUA). This EUA will remain in effect (meaning this test can be used) for the duration of the COVID-19 declaration under Section 564(b)(1) of the Act, 21 U.S.C. section 360bbb-3(b)(1), unless the authorization is terminated or revoked.  Performed at [facility], [address] [place], [place] [address]   Blood Culture (routine  x 2)     Status: None   Collection Time: 09/22/20  2:41 PM   Specimen: BLOOD  Result Value Ref Range Status   Specimen Description   Final    BLOOD BLOOD LEFT FOREARM Performed at [facility], [address], [place], [place] [address]    Special Requests   Final    BOTTLES DRAWN AEROBIC AND ANAEROBIC Blood Culture adequate volume Performed at [facility], [address] [place], [place] [address]    Culture   Final    NO GROWTH 5 DAYS Performed at [facility], [address] [place], [place] [address]    Report Status 09/27/2020 FINAL  Final  Blood Culture (routine x 2)     Status: None   Collection Time: 09/22/20  5:20 PM   Specimen: BLOOD LEFT HAND  Result Value Ref Range Status   Specimen Description   Final    BLOOD LEFT HAND Performed at [facility], [address], [place], [place] [address]    Special Requests   Final    BOTTLES DRAWN AEROBIC AND ANAEROBIC Blood Culture results may not be optimal due to an excessive volume of blood received in culture bottles Performed at [facility], [address], [place], [place], [place] [address]    Culture   Final    NO GROWTH 5 DAYS Performed at [facility], [address], [place], [place] [address]    Report Status 09/27/2020 FINAL  Final  Respiratory (~20 pathogens) panel by PCR     Status: None   Collection Time: 09/22/20 10:47 PM   Specimen: Nasopharyngeal Swab; Respiratory  Result Value Ref Range Status   Adenovirus NOT DETECTED NOT DETECTED Final   Coronavirus 229E NOT DETECTED NOT DETECTED Final    Comment: (NOTE) The Coronavirus on the Respiratory Panel, DOES NOT test for the novel  Coronavirus (2019 nCoV)    Coronavirus HKU1 NOT DETECTED NOT DETECTED Final   Coronavirus NL63 NOT DETECTED NOT DETECTED Final   Coronavirus OC43 NOT DETECTED NOT DETECTED Final   Metapneumovirus NOT DETECTED NOT DETECTED Final   Rhinovirus / Enterovirus NOT  DETECTED NOT DETECTED Final   Influenza A NOT DETECTED NOT DETECTED Final   Influenza B NOT DETECTED NOT DETECTED Final   Parainfluenza Virus 1 NOT DETECTED NOT DETECTED Final   Parainfluenza Virus 2 NOT DETECTED NOT DETECTED Final   Parainfluenza Virus 3 NOT DETECTED NOT DETECTED Final   Parainfluenza Virus 4 NOT DETECTED NOT DETECTED Final   Respiratory Syncytial Virus NOT DETECTED NOT DETECTED Final   Bordetella pertussis NOT DETECTED NOT DETECTED Final   Bordetella Parapertussis NOT DETECTED NOT DETECTED Final   Chlamydophila pneumoniae NOT DETECTED NOT DETECTED Final   Mycoplasma pneumoniae NOT DETECTED NOT DETECTED Final    Comment: Performed at [facility], [address] [place], [place] [address]  MRSA Next Gen by PCR, Nasal     Status: None   Collection Time: 09/24/20  5:24 AM   Specimen: Nasal Mucosa; Nasal Swab  Result Value Ref Range Status   MRSA by PCR Next Gen NOT DETECTED NOT DETECTED Final    Comment: (NOTE) The GeneXpert MRSA Assay (FDA approved for NASAL specimens only), is one component of a comprehensive MRSA colonization surveillance program. It is not intended to diagnose MRSA infection nor to guide or monitor treatment for MRSA infections. Test performance is not FDA approved in patients less than 41 years old. Performed at [facility] [address] [place], [place] [address]   Resp Panel by RT-PCR (Flu A&B, Covid) Nasopharyngeal Swab     Status: None   Collection Time: 09/24/20  2:21 PM   Specimen: Nasopharyngeal Swab; Nasopharyngeal(NP) swabs in vial transport medium  Result Value Ref Range Status   SARS Coronavirus 2 by RT PCR NEGATIVE NEGATIVE Final    Comment: (NOTE) SARS-CoV-2 target nucleic acids are NOT DETECTED.  The SARS-CoV-2 RNA is generally detectable in upper respiratory specimens during the acute phase of infection. The lowest concentration of SARS-CoV-2 viral copies this assay can detect is 138 copies/mL. A  negative result does not preclude SARS-Cov-2 infection and should not be used as the sole basis for treatment or other patient management decisions. A negative result may occur with  improper specimen collection/handling, submission of specimen other than nasopharyngeal swab, presence of viral mutation(s) within the areas targeted by this assay, and inadequate number of viral copies(<138 copies/mL). A negative result must be combined with clinical observations, patient history, and epidemiological information. The expected result is Negative.  Fact Sheet for Patients:  BloggerCourse.com  Fact Sheet for Healthcare Providers:  SeriousBroker.it  This test is no t yet approved or cleared by the [place] FDA and  has been authorized for detection and/or diagnosis of SARS-CoV-2 by FDA under an Emergency Use Authorization (EUA). This EUA will remain  in effect (meaning this test can be used) for the duration of the COVID-19 declaration under Section 564(b)(1) of the Act, 21 U.S.C.section 360bbb-3(b)(1), unless the authorization is terminated  or revoked sooner.       Influenza A by PCR NEGATIVE NEGATIVE Final   Influenza B by PCR NEGATIVE NEGATIVE Final    Comment: (NOTE) The Xpert Xpress SARS-CoV-2/FLU/RSV plus assay is intended as an aid in the diagnosis of influenza from Nasopharyngeal swab specimens and should not be used as  a sole basis for treatment. Nasal washings and aspirates are unacceptable for Xpert Xpress SARS-CoV-2/FLU/RSV testing.  Fact Sheet for Patients: BloggerCourse.com  Fact Sheet for Healthcare Providers: SeriousBroker.it  This test is not yet approved or cleared by the [place] FDA and has been authorized for detection and/or diagnosis of SARS-CoV-2 by FDA under an Emergency Use Authorization (EUA). This EUA will remain in effect (meaning this test can  be used) for the duration of the COVID-19 declaration under Section 564(b)(1) of the Act, 21 U.S.C. section 360bbb-3(b)(1), unless the authorization is terminated or revoked.  Performed at [facility], [address], [place], [place] [address]   Urine Culture     Status: Abnormal   Collection Time: 10/01/20 10:13 AM   Specimen: Urine, Catheterized  Result Value Ref Range Status   Specimen Description   Final    URINE, CATHETERIZED Performed at [facility], [address] [place], [place] [address]    Special Requests   Final    NONE Performed at [facility], [address] [place], [place] [address]    Culture 40,000 COLONIES/mL YEAST (A)  Final   Report Status 10/02/2020 FINAL  Final    Radiology Studies: DG Abd 1 View  Result Date: 09/30/2020 CLINICAL DATA:  Abdominal distension EXAM: ABDOMEN - 1 VIEW COMPARISON:  None. FINDINGS: Nonobstructive bowel gas pattern. Cholecystectomy clips. Mild degenerative changes of the lower lumbar spine. IMPRESSION: Negative. Electronically Signed   By: Charline Bills M.D.   On: 09/30/2020 20:20   CT ABDOMEN PELVIS W CONTRAST  Result Date: 09/30/2020 CLINICAL DATA:  Abdominal pain, leukocytosis EXAM: CT ABDOMEN AND PELVIS WITH CONTRAST TECHNIQUE: Multidetector CT imaging of the abdomen and pelvis was performed using the standard protocol following bolus administration of intravenous contrast. CONTRAST:  38mL OMNIPAQUE IOHEXOL 350 MG/ML SOLN COMPARISON:  Right upper quadrant ultrasound dated 09/23/2020. FINDINGS: Lower chest: Patchy bilateral lower lobe opacities, atelectasis versus pneumonia. Hepatobiliary: Mildly macronodular hepatic contour. Status post cholecystectomy. No intrahepatic or extrahepatic ductal dilatation. Pancreas: Within normal limits. No peripancreatic fluid/inflammatory changes. Spleen: Within normal limits. Adrenals/Urinary Tract: Adrenal glands are within normal  limits. 9 mm left upper pole renal calculus (series 2/image 22). Right kidney is within normal limits. No hydronephrosis. Bladder is decompressed by an indwelling Foley catheter with nondependent gas. Stomach/Bowel: Stomach is within normal limits. No evidence of bowel obstruction. Appendix is not discretely visualized. Sigmoid diverticulosis, without evidence of diverticulitis. Vascular/Lymphatic: No evidence of abdominal aortic aneurysm. Atherosclerotic calcifications of the abdominal aorta and branch vessels. No suspicious abdominopelvic lymphadenopathy. Reproductive: Status post hysterectomy. No adnexal masses. Other: No abdominopelvic ascites. Musculoskeletal: Mild degenerative changes of the visualized thoracolumbar spine. IMPRESSION: Patchy bilateral lower lobe opacities, atelectasis versus pneumonia. Sigmoid diverticulosis, without evidence of diverticulitis. No peripancreatic inflammatory changes on CT. Status post cholecystectomy and hysterectomy. Appendix is not discretely visualized. Electronically Signed   By: Charline Bills M.D.   On: 09/30/2020 23:59   ECHOCARDIOGRAM LIMITED BUBBLE STUDY  Result Date: 10/01/2020    ECHOCARDIOGRAM LIMITED REPORT   Patient Name:   Kimberly Ballard Date of Exam: 10/01/2020 Medical Rec #:  [id number]     Height:       63.0 in Accession #:    [id number]    Weight:       200.4 lb Date of Birth:  09/23/1940    BSA:          1.935 m Patient Age:    76 years  BP:           116/83 mmHg Patient Gender: F             HR:           100 bpm. Exam Location:  Inpatient Procedure: Limited Echo, Color Doppler, Cardiac Doppler and Saline Contrast            Bubble Study Indications:    I48.91* Unspecified atrial fibrillation  History:        Patient has prior history of Echocardiogram examinations, most                 recent 09/23/2020. COPD; Arrythmias:Atrial Fibrillation.  Sonographer:    Andee Lineman Referring Phys: [id number] Kateri Mc LATIF [facility] IMPRESSIONS  1. Left ventricular  ejection fraction, by estimation, is 60 to 65%. The left ventricle has normal function. There is moderate concentric left ventricular hypertrophy. Left ventricular diastolic function could not be evaluated.  2. The mitral valve is grossly normal. Trivial mitral valve regurgitation. No evidence of mitral stenosis.  3. The aortic valve is tricuspid. There is mild calcification of the aortic valve. Aortic valve regurgitation is trivial. No aortic stenosis is present.  4. Agitated saline contrast bubble study was negative, with no evidence of any interatrial shunt. Conclusion(s)/Recommendation(s): Limited study, no major change from recent study 09/23/20. Bubble study negative for intraatrial shunt. FINDINGS  Left Ventricle: Left ventricular ejection fraction, by estimation, is 60 to 65%. The left ventricle has normal function. The left ventricular internal cavity size was normal in size. There is moderate concentric left ventricular hypertrophy. Left ventricular diastolic function could not be evaluated. Left ventricular diastolic function could not be evaluated due to atrial fibrillation. Pericardium: Presence of pericardial fat pad. Mitral Valve: The mitral valve is grossly normal. Trivial mitral valve regurgitation. No evidence of mitral valve stenosis. Tricuspid Valve: The tricuspid valve is grossly normal. Tricuspid valve regurgitation is trivial. No evidence of tricuspid stenosis. Aortic Valve: The aortic valve is tricuspid. There is mild calcification of the aortic valve. Aortic valve regurgitation is trivial. No aortic stenosis is present. Aortic valve mean gradient measures 5.0 mmHg. Aortic valve peak gradient measures 6.9 mmHg. Aortic valve area, by VTI measures 2.39 cm. Pulmonic Valve: The pulmonic valve was grossly normal. Pulmonic valve regurgitation is trivial. No evidence of pulmonic stenosis. IAS/Shunts: No atrial level shunt detected by color flow Doppler. Agitated saline contrast was given  intravenously to evaluate for intracardiac shunting. Agitated saline contrast bubble study was negative, with no evidence of any interatrial shunt. LEFT VENTRICLE PLAX 2D LVIDd:         4.00 cm     Diastology LVIDs:         2.60 cm     LV e' medial:  5.87 cm/s LV PW:         1.50 cm     LV e' lateral: 15.80 cm/s LV IVS:        1.70 cm LVOT diam:     1.90 cm LV SV:         50 LV SV Index:   26 LVOT Area:     2.84 cm  LV Volumes (MOD) LV vol d, MOD A4C: 34.9 ml LV vol s, MOD A4C: 13.7 ml LV SV MOD A4C:     34.9 ml RIGHT VENTRICLE RV S prime:     8.27 cm/s TAPSE (M-mode): 1.5 cm LEFT ATRIUM             Index  RIGHT ATRIUM           Index LA diam:        2.40 cm 1.24 cm/m  RA Area:     15.10 cm LA Vol (A2C):   64.6 ml 33.38 ml/m RA Volume:   44.40 ml  22.94 ml/m LA Vol (A4C):   42.2 ml 21.81 ml/m LA Biplane Vol: 56.2 ml 29.04 ml/m  AORTIC VALVE AV Area (Vmax):    2.19 cm AV Area (Vmean):   1.79 cm AV Area (VTI):     2.39 cm AV Vmax:           131.00 cm/s AV Vmean:          102.000 cm/s AV VTI:            0.211 m AV Peak Grad:      6.9 mmHg AV Mean Grad:      5.0 mmHg LVOT Vmax:         101.00 cm/s LVOT Vmean:        64.300 cm/s LVOT VTI:          0.178 m LVOT/AV VTI ratio: 0.84  AORTA Ao Root diam: 3.20 cm Ao Asc diam:  3.90 cm MITRAL VALVE MV Area (PHT): 2.93 cm SHUNTS                         Systemic VTI:  0.18 m                         Systemic Diam: 1.90 cm Jodelle Red MD Electronically signed by Jodelle Red MD Signature Date/Time: 10/01/2020/5:24:05 PM    Final     Scheduled Meds:  apixaban  5 mg Oral BID   arformoterol  15 mcg Nebulization BID   budesonide (PULMICORT) nebulizer solution  0.5 mg Nebulization BID   chlorhexidine  15 mL Mouth Rinse BID   Chlorhexidine Gluconate Cloth  6 each Topical Daily   diltiazem  240 mg Oral Daily   furosemide  40 mg Intravenous Daily   guaiFENesin  600 mg Oral BID   mouth rinse  15 mL Mouth Rinse q12n4p   polyethylene glycol  17 g  Oral BID   [START ON 10/03/2020] predniSONE  50 mg Oral Q breakfast   Followed by   Melene Muller ON 10/06/2020] predniSONE  40 mg Oral Q breakfast   Followed by   Melene Muller ON 10/11/2020] predniSONE  30 mg Oral Q breakfast   Followed by   Melene Muller ON 10/16/2020] predniSONE  20 mg Oral Q breakfast   Followed by   Melene Muller ON 10/21/2020] predniSONE  10 mg Oral Q breakfast   revefenacin  175 mcg Nebulization Daily   Continuous Infusions:  sodium chloride Stopped (09/29/20 1824)     LOS: 10 days    Time spent: 35 mins.    Cipriano Bunker, MD [facility]   If 7PM-7AM, please contact night-coverage

## 2020-10-02 NOTE — Progress Notes (Signed)
PT Cancellation Note  Patient Details Name: Kimberly Ballard MRN: [id number] DOB: 05-12-1940   Cancelled Treatment:    Reason Eval/Treat Not Completed: Patient declined, no reason specified, in recliner on bed pan. Will check back another time.   [name] 10/02/2020, 11:50 AM Blanchard Kelch PT Acute Rehabilitation Services Pager [phone] Office [phone]

## 2020-10-02 NOTE — Progress Notes (Addendum)
Progress Note  Patient Name: Kimberly Ballard Date of Encounter: 10/02/2020  [facility] Cardiologist: Dr. Mayford Knife  Subjective   Sleepy on BiPAP, rouses to verbal. Not able to hear well. No CP  Inpatient Medications    Scheduled Meds:  apixaban  5 mg Oral BID   arformoterol  15 mcg Nebulization BID   budesonide (PULMICORT) nebulizer solution  0.5 mg Nebulization BID   chlorhexidine  15 mL Mouth Rinse BID   Chlorhexidine Gluconate Cloth  6 each Topical Daily   diltiazem  240 mg Oral Daily   furosemide  40 mg Intravenous Daily   guaiFENesin  600 mg Oral BID   mouth rinse  15 mL Mouth Rinse q12n4p   methylPREDNISolone (SOLU-MEDROL) injection  50 mg Intravenous Q24H   polyethylene glycol  17 g Oral BID   revefenacin  175 mcg Nebulization Daily   Continuous Infusions:  sodium chloride Stopped (09/29/20 1824)   PRN Meds: sodium chloride, levalbuterol, senna-docusate, simethicone, sodium chloride   Vital Signs    Vitals:   10/02/20 0414 10/02/20 0500 10/02/20 0600 10/02/20 0700  BP:  112/73 124/79 128/71  Pulse:  (!) 58 77 71  Resp:  17 16 17   Temp: (!) 96.4 F (35.8 C)     TempSrc: Axillary     SpO2:  95% 93% 97%  Weight:      Height:        Intake/Output Summary (Last 24 hours) at 10/02/2020 0736 Last data filed at 10/02/2020 0500 Gross per 24 hour  Intake 370.44 ml  Output 1350 ml  Net -979.56 ml   Last 3 Weights 09/22/2020  Weight (lbs) 200 lb 6.4 oz  Weight (kg) 90.901 kg      Telemetry    Atrial fib, spikes at times, but HR control generally improved - Personally Reviewed  ECG    No new EKG tracing today. - Personally Reviewed  Physical Exam   GEN: No acute distress.   Neck: No JVD seen, not able to assess well 2nd BiPAP Cardiac: Irreg R&R, no murmur, no rubs, or gallops.  Respiratory: diminished to auscultation bilateral bases. GI: Soft, nontender, non-distended  MS: No edema; No deformity. Neuro:  Nonfocal  Psych: Normal affect   Labs     High Sensitivity Troponin:   Recent Labs  Lab 09/22/20 2148 09/23/20 0207 09/24/20 0239  TROPONINIHS 128* 100* 52*      Chemistry Recent Labs  Lab 09/28/20 0311 09/29/20 0311 09/30/20 0255 10/01/20 0249 10/02/20 0310  NA 140 139 137 132* 135  K 3.8 3.9 4.2 4.1 4.2  CL 88* 85* 84* 80* 81*  CO2 39* 41* 43* 40* 42*  GLUCOSE 152* 147* 165* 172* 137*  BUN 46* 51* 45* 47* 41*  CREATININE 0.70 0.85 0.67 0.75 0.70  CALCIUM 8.7* 8.6* 8.9 9.0 9.0  PROT 6.5 6.0* 6.1*  --   --   ALBUMIN 2.8* 2.7* 2.8*  --   --   AST 37 40 39  --   --   ALT 129* 112* 108*  --   --   ALKPHOS 78 68 65  --   --   BILITOT 0.8 1.0 1.2  --   --   GFRNONAA >60 >60 >60 >60 >60  ANIONGAP 13 13 10 12 12      Hematology Recent Labs  Lab 09/29/20 0311 09/30/20 0255 10/01/20 0249  WBC 17.6* 17.6* 20.2*  RBC 5.62* 5.79* 5.90*  HGB 17.9* 18.1* 18.5*  HCT 53.6* 55.4*  56.0*  MCV 95.4 95.7 94.9  MCH 31.9 31.3 31.4  MCHC 33.4 32.7 33.0  RDW 13.4 13.4 13.2  PLT 324 298 270    BNP Recent Labs  Lab 09/26/20 0303  BNP 964.7*    Lab Results  Component Value Date   TSH 1.988 09/23/2020   No results found for: HGBA1C Lab Results  Component Value Date   CKTOTAL 60 09/22/2020   Lab Results  Component Value Date   DDIMER 2.04 (H) 09/23/2020     Radiology    CXR 08/29 IMPRESSION: Cardiomegaly with mild interstitial edema and small bilateral pleural effusions.   Superimposed mild patchy right lower lobe opacity, unchanged, atelectasis versus pneumonia.    DG Abd 1 View  Result Date: 09/30/2020 CLINICAL DATA:  Abdominal distension EXAM: ABDOMEN - 1 VIEW COMPARISON:  None. FINDINGS: Nonobstructive bowel gas pattern. Cholecystectomy clips. Mild degenerative changes of the lower lumbar spine. IMPRESSION: Negative. Electronically Signed   By: Charline Bills M.D.   On: 09/30/2020 20:20   CT ABDOMEN PELVIS W CONTRAST  Result Date: 09/30/2020 CLINICAL DATA:  Abdominal pain, leukocytosis  EXAM: CT ABDOMEN AND PELVIS WITH CONTRAST TECHNIQUE: Multidetector CT imaging of the abdomen and pelvis was performed using the standard protocol following bolus administration of intravenous contrast. CONTRAST:  20mL OMNIPAQUE IOHEXOL 350 MG/ML SOLN COMPARISON:  Right upper quadrant ultrasound dated 09/23/2020. FINDINGS: Lower chest: Patchy bilateral lower lobe opacities, atelectasis versus pneumonia. Hepatobiliary: Mildly macronodular hepatic contour. Status post cholecystectomy. No intrahepatic or extrahepatic ductal dilatation. Pancreas: Within normal limits. No peripancreatic fluid/inflammatory changes. Spleen: Within normal limits. Adrenals/Urinary Tract: Adrenal glands are within normal limits. 9 mm left upper pole renal calculus (series 2/image 22). Right kidney is within normal limits. No hydronephrosis. Bladder is decompressed by an indwelling Foley catheter with nondependent gas. Stomach/Bowel: Stomach is within normal limits. No evidence of bowel obstruction. Appendix is not discretely visualized. Sigmoid diverticulosis, without evidence of diverticulitis. Vascular/Lymphatic: No evidence of abdominal aortic aneurysm. Atherosclerotic calcifications of the abdominal aorta and branch vessels. No suspicious abdominopelvic lymphadenopathy. Reproductive: Status post hysterectomy. No adnexal masses. Other: No abdominopelvic ascites. Musculoskeletal: Mild degenerative changes of the visualized thoracolumbar spine. IMPRESSION: Patchy bilateral lower lobe opacities, atelectasis versus pneumonia. Sigmoid diverticulosis, without evidence of diverticulitis. No peripancreatic inflammatory changes on CT. Status post cholecystectomy and hysterectomy. Appendix is not discretely visualized. Electronically Signed   By: Charline Bills M.D.   On: 09/30/2020 23:59   ECHOCARDIOGRAM LIMITED BUBBLE STUDY  Result Date: 10/01/2020    ECHOCARDIOGRAM LIMITED REPORT   Patient Name:   Kimberly Ballard Date of Exam: 10/01/2020  Medical Rec #:  [id number]     Height:       63.0 in Accession #:    [id number]    Weight:       200.4 lb Date of Birth:  08-Jun-1940    BSA:          1.935 m Patient Age:    79 years      BP:           116/83 mmHg Patient Gender: F             HR:           100 bpm. Exam Location:  Inpatient Procedure: Limited Echo, Color Doppler, Cardiac Doppler and Saline Contrast            Bubble Study Indications:    I48.91* Unspecified atrial fibrillation  History:  Patient has prior history of Echocardiogram examinations, most                 recent 09/23/2020. COPD; Arrythmias:Atrial Fibrillation.  Sonographer:    Andee Lineman Referring Phys: [id number] Kateri Mc LATIF [facility] IMPRESSIONS  1. Left ventricular ejection fraction, by estimation, is 60 to 65%. The left ventricle has normal function. There is moderate concentric left ventricular hypertrophy. Left ventricular diastolic function could not be evaluated.  2. The mitral valve is grossly normal. Trivial mitral valve regurgitation. No evidence of mitral stenosis.  3. The aortic valve is tricuspid. There is mild calcification of the aortic valve. Aortic valve regurgitation is trivial. No aortic stenosis is present.  4. Agitated saline contrast bubble study was negative, with no evidence of any interatrial shunt. Conclusion(s)/Recommendation(s): Limited study, no major change from recent study 09/23/20. Bubble study negative for intraatrial shunt. FINDINGS  Left Ventricle: Left ventricular ejection fraction, by estimation, is 60 to 65%. The left ventricle has normal function. The left ventricular internal cavity size was normal in size. There is moderate concentric left ventricular hypertrophy. Left ventricular diastolic function could not be evaluated. Left ventricular diastolic function could not be evaluated due to atrial fibrillation. Pericardium: Presence of pericardial fat pad. Mitral Valve: The mitral valve is grossly normal. Trivial mitral valve regurgitation. No  evidence of mitral valve stenosis. Tricuspid Valve: The tricuspid valve is grossly normal. Tricuspid valve regurgitation is trivial. No evidence of tricuspid stenosis. Aortic Valve: The aortic valve is tricuspid. There is mild calcification of the aortic valve. Aortic valve regurgitation is trivial. No aortic stenosis is present. Aortic valve mean gradient measures 5.0 mmHg. Aortic valve peak gradient measures 6.9 mmHg. Aortic valve area, by VTI measures 2.39 cm. Pulmonic Valve: The pulmonic valve was grossly normal. Pulmonic valve regurgitation is trivial. No evidence of pulmonic stenosis. IAS/Shunts: No atrial level shunt detected by color flow Doppler. Agitated saline contrast was given intravenously to evaluate for intracardiac shunting. Agitated saline contrast bubble study was negative, with no evidence of any interatrial shunt. LEFT VENTRICLE PLAX 2D LVIDd:         4.00 cm     Diastology LVIDs:         2.60 cm     LV e' medial:  5.87 cm/s LV PW:         1.50 cm     LV e' lateral: 15.80 cm/s LV IVS:        1.70 cm LVOT diam:     1.90 cm LV SV:         50 LV SV Index:   26 LVOT Area:     2.84 cm  LV Volumes (MOD) LV vol d, MOD A4C: 34.9 ml LV vol s, MOD A4C: 13.7 ml LV SV MOD A4C:     34.9 ml RIGHT VENTRICLE RV S prime:     8.27 cm/s TAPSE (M-mode): 1.5 cm LEFT ATRIUM             Index       RIGHT ATRIUM           Index LA diam:        2.40 cm 1.24 cm/m  RA Area:     15.10 cm LA Vol (A2C):   64.6 ml 33.38 ml/m RA Volume:   44.40 ml  22.94 ml/m LA Vol (A4C):   42.2 ml 21.81 ml/m LA Biplane Vol: 56.2 ml 29.04 ml/m  AORTIC VALVE AV Area (Vmax):    2.19 cm  AV Area (Vmean):   1.79 cm AV Area (VTI):     2.39 cm AV Vmax:           131.00 cm/s AV Vmean:          102.000 cm/s AV VTI:            0.211 m AV Peak Grad:      6.9 mmHg AV Mean Grad:      5.0 mmHg LVOT Vmax:         101.00 cm/s LVOT Vmean:        64.300 cm/s LVOT VTI:          0.178 m LVOT/AV VTI ratio: 0.84  AORTA Ao Root diam: 3.20 cm Ao Asc diam:   3.90 cm MITRAL VALVE MV Area (PHT): 2.93 cm SHUNTS                         Systemic VTI:  0.18 m                         Systemic Diam: 1.90 cm Jodelle RedBridgette Jenefer Woerner MD Electronically signed by Jodelle RedBridgette Tensley Wery MD Signature Date/Time: 10/01/2020/5:24:05 PM    Final     Cardiac Studies   Complete Echo 09/23/2020: Impressions:  1. Left ventricular ejection fraction, by estimation, is 55 to 60%. Left  ventricular ejection fraction by 3D volume is 59 %. The left ventricle has normal function. The left ventricle has no regional wall motion  abnormalities. There is moderate left ventricular hypertrophy. Left ventricular diastolic parameters are consistent with Grade I diastolic dysfunction (impaired relaxation).   2. Right ventricular systolic function mildly reduced at base and mid,  with relative preserved apical systolic function. The right ventricular  size is moderately enlarged. There is moderately elevated pulmonary artery systolic pressure. The estimated right ventricular systolic pressure is 48.4 mmHg.   3. Left atrial size was mildly dilated.   4. Right atrial size was moderately dilated.   5. The mitral valve is normal in structure. Trivial mitral valve  regurgitation. No evidence of mitral stenosis.   6. Tricuspid valve regurgitation is mild to moderate.   7. The aortic valve is normal in structure. Aortic valve regurgitation is not visualized. No aortic stenosis is present.   8. The inferior vena cava is dilated in size with <50% respiratory  variability, suggesting right atrial pressure of 15 mmHg.   Patient Profile     80 y.o. female with a history of COPD with prior tobacco use (quit in 2019) who was admitted on 09/22/2020 for acute hypoxic respiratory failure secondary to COPD exacerbation and multifocal pneumonia after presenting with shortness of breath, body aches, cough, and decreased appetite. Negative for COVID this admission. Cardiology consulted for elevated troponin.  Did alter develop atrial fibrillation with RVR and was started on Cardizem and Eliquis with control of rates but not restoration of sinus rhythm Cardiology signed off on 09/26/2020 with plans to have patient follow-up in the A. Fib Clinc and likely DCCV after 3 or 4 weeks of anticoagulation. We were reconsulted 08/31 given recurrent RVR.  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 - BNP was trending up as of 08/26 - CXR 08/29 w/ small effusions - PASP elevated at 48, grade 1 dd w/ nl EF on echo - CTA chest PE protocol 08/22 and  08/24 neg PE - no wt since 08/24 >> ordered, I/O incomplete but net - 12.1 L - Lasix 80 mg IV bid >> 40 mg IV qd as of 09/01 - w/ COPD, vol status can be difficult to assess, O2 req still high - recheck BNP in am, cont to follow BMET  Elevated Troponin - mild elevation in trop more c/w demand ischemia - EF nl w/ no WMA on echo - T wave inversions on 08/22 ECG had improved by 08/27 - +coronary calcium on chest CT  Otherwise, per primary team: - COPD Exacerbation - Multifocal pneumonia  - Abdominal pain - Transamnitis  - hematuria > Urologist contacted, felt this was 2nd Eliquis, Urine culture, consider Ditropan, f/u as outpt   For questions or updates, please contact [facility] Please consult [email or web address] for contact info under     Signed, Theodore Demark, PA-C  10/02/2020, 7:36 AM    Patient seen and examined.  Agree with above documentation.  On exam, patient is alert and oriented, irregular, normal rate, no murmurs, diminished breath sounds, no LE edema.  Review of telemetry shows A. fib  with rates 90s to 110s though briefly as high as 140s.  Continue diltiazem 240 mg daily.  Little Ishikawa, MD

## 2020-10-02 NOTE — Progress Notes (Signed)
NAME:  Kimberly Ballard, MRN:  [id number], DOB:  Mar 18, 1940, LOS: 10 ADMISSION DATE:  09/22/2020, CONSULTATION DATE:  8/23 REFERRING MD:  Adela Glimpse, CHIEF COMPLAINT:  Dyspnea, cough   History of Present Illness:  80 y/o femal presented to the [facility] ED with dyspnea, body aches, cough on 8/22 after a significant COVID exposure.  Her daughter had COVID and the patient developed symptoms around 8/16.  She discussed with her PCP and a paxlovid Rx was send on 8/18.  At [facility] she has had 2 negative tests for COVID (both PCR), but her IgG antibody was also negative. Per PCP notes she has received the primary series COVID vaccine as well as 2 boosters.    Pertinent  Medical History  COPD Atrial fibrillation Former cigarette smoker  Significant Hospital Events: Including procedures, antibiotic start and stop dates in addition to other pertinent events   8/22 > admit.  Admitted to stepdown unit.  Cough weak.  Pulmonary asked to evaluate given concern for progressive respiratory failure.  COVID-19 and influenza negative. Respiratory viral panel negative.   IV ceftriaxone and azithromycin initiated.  Given IV Solu-Medrol followed by oral prednisone order. 8/23 pulmonary asked to evaluate given worsening respiratory failure.  CT chest showing lower lobe consolidation.  There was concern about possible aspiration.  Echo obtained showed good left ventricular function with mild pulmonary artery hypertension. RUQ ultrasound showed hepatic steatosis, possibly cirrhosis based on nodular liver contour, no gallbladder 8/24 cardiology consulted for elevated troponins.  This was felt secondary to worsening hypoxia and underlying pulmonary artery hypertension which was felt previously undiagnosed issue.  New onset atrial fibrillation with RVR.  CT angiogram ordered.  Increased oxygen requirement overnight, requiring BiPAP, later placed on 15 L/min via high flow.  Lower extremity ultrasound negative for DVT 8/25: u strep neg.   still high FIO2. F/u COVID neg. Transitioned to oral CCB and DOAC. Getting IV lasix. Transitioned to headed High flow [place] during day and BIPAP at night.  8/26 still w/ overall net positive fluid balance over hospital stay. CXR w/ worse edema. Lasix increased to 80 mg q 12. Azithromycin stopped (day 5) Steroids reduced.  Cardiology signed off. (See note from cards w/ plan to follow as out-pt)  8/27 diuresed, CXR better, eating, got up to chair 8/28-9/1Remains on FIO2 80% despite aggressive diuresis. Net neg 11L Interim History / Subjective:  Unchanged FIO2 80% Urology was consulted hematuria Good UOP  Objective   Blood pressure 119/62, pulse 84, temperature (!) 96 F (35.6 C), temperature source Axillary, resp. rate 18, height 5\' 3"  (1.6 m), weight 88.2 kg, SpO2 93 %.    FiO2 (%):  [80 %] 80 %   Intake/Output Summary (Last 24 hours) at 10/02/2020 1012 Last data filed at 10/02/2020 1000 Gross per 24 hour  Intake 360 ml  Output 1430 ml  Net -1070 ml   Filed Weights   09/22/20 2020 10/02/20 0821  Weight: 90.9 kg 88.2 kg   Physical Exam: General: Chronically ill-appearing, no acute distress HENT: [place], AT, OP clear, MMM, heated high flow nasal cannula Eyes: EOMI, no scleral icterus Respiratory: Clear to auscultation bilaterally.  No crackles, wheezing or rales Cardiovascular: Irregular rate and rhythm, -M/R/G, no JVD GI: BS+, soft, nontender Extremities:-Edema,-tenderness Neuro: AAO x4, CNII-XII grossly intact  Stable Cr with diuresis   Resolved Hospital Problem list     Assessment & Plan:  Multifactorial Acute on chronic hypercapnic and hypoxemic respiratory failure with underlying COPD, emphysema seen on CT chest  with atelectasis, pulmonary infiltrates in lower lobes due to community acquired pneumonia. 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 She has been  adequately diuresed, completed antibiotics and steroids have not mitigated her O2 requirement. No evidence of cardiac shunt.  Autoimmune work-up negative except for slightly elevated SSB IgG ab which is non-specific. Currently diagnosis is this may be a slow resolving pneumonia +/- pulmonary/cardiac shunt. Plan: Maintain in ICU Continue heated high flow. Order ABG to facilitate FIO2 weaning Continue to wean FIO2 and flow for goal SpO2 >85% with tolerating of saturations >75% with movement. Decision for NIMV should be based on a change in mental status or physical evidence of ventilatory failure such as nasal flaring, accessory muscle use, paradoxical breathing Lasix daily Steroid taper ordered. 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  She wants to continue to try to get out of the  hospital but she doesn't want to go on life support or receive CPR. I called her son Arlys John to discuss and he agreed.]   Labs   CBC: Recent Labs  Lab 09/27/20 0311 09/28/20 0311 09/29/20 0311 09/30/20 0255 10/01/20 0249  WBC 15.1* 19.2* 17.6* 17.6* 20.2*  NEUTROABS 13.4* 17.3* 15.7* 15.9*  --   HGB 16.3* 17.6* 17.9* 18.1* 18.5*  HCT 50.2* 53.8* 53.6* 55.4* 56.0*  MCV 98.2 96.1 95.4 95.7 94.9  PLT 325 370 324 298 270    Basic Metabolic Panel: Recent Labs  Lab 09/26/20 0303 09/26/20 0903 09/27/20 0311 09/27/20 1824 09/28/20 0311 09/29/20 0311 09/30/20 0255 10/01/20 0249 10/02/20 0310  NA 142   < > 145   < > 140 139 137 132* 135  K 4.9   < > 4.2   < > 3.8 3.9 4.2 4.1 4.2  CL 101   < > 96*   < > 88* 85* 84* 80* 81*  CO2 32   < > 41*   < > 39* 41* 43* 40* 42*  GLUCOSE 168*   < > 175*   < > 152* 147* 165* 172* 137*  BUN 37*   < > 38*   < > 46* 51* 45* 47* 41*  CREATININE 0.58   < > 0.57   < > 0.70 0.85 0.67 0.75 0.70  CALCIUM 8.6*   < > 8.8*   < > 8.7* 8.6* 8.9 9.0 9.0  MG 2.8*  --  2.4  --  2.4 2.3 2.3  --   --  PHOS 3.6  --  4.3  --  4.5 4.3 3.9  --   --    < > = values in this interval not displayed.   GFR: Estimated Creatinine Clearance: 60 mL/min (by C-G formula based on SCr of 0.7 mg/dL). Recent Labs  Lab 09/28/20 0311 09/29/20 0311 09/30/20 0255 10/01/20 0249  WBC 19.2* 17.6* 17.6* 20.2*    Liver Function Tests: Recent Labs  Lab 09/27/20 0311 09/27/20 1824 09/28/20 0311 09/29/20 0311 09/30/20 0255  AST 35 42* 37 40 39  ALT 149* 146* 129* 112* 108*  ALKPHOS 75 89 78 68 65  BILITOT 0.7 1.1 0.8 1.0 1.2  PROT 6.4* 7.2 6.5 6.0* 6.1*  ALBUMIN 2.6* 3.0* 2.8* 2.7* 2.8*   Recent Labs  Lab 09/30/20 0250  LIPASE 54*   No results for input(s): AMMONIA in the last 168 hours.  ABG    Component Value Date/Time   PHART 7.325 (L) 09/25/2020 0320   PCO2ART 67.6 (HH) 09/25/2020 0320   PO2ART 130 (H) 09/25/2020 0320   HCO3 34.2 (H) 09/25/2020 0320    O2SAT 98.6 09/25/2020 0320     Coagulation Profile: No results for input(s): INR, PROTIME in the last 168 hours.   Cardiac Enzymes: No results for input(s): CKTOTAL, CKMB, CKMBINDEX, TROPONINI in the last 168 hours.   HbA1C: No results found for: HGBA1C  CBG: Recent Labs  Lab 09/27/20 1158  GLUCAP 183*    Critical care time:    The patient is critically ill with respiratory failure and high risk of deterioration.  Independent Critical Care Time: 36 Minutes.   Mechele Collin, M.D. [facility] Pulmonary/Critical Care Medicine 10/02/2020 10:12 AM   Please see Amion for pager number to reach on-call Pulmonary and Critical Care Team.

## 2020-10-02 NOTE — Progress Notes (Signed)
Pt unable to void without foley in place. Bladder scan showed . Foley replaced per MD. bloody urine immediately returned. Attending and urologist notified. No new orders. Urologist will assess.

## 2020-10-03 DIAGNOSIS — I517 Cardiomegaly: Secondary | ICD-10-CM | POA: Diagnosis not present

## 2020-10-03 DIAGNOSIS — I4891 Unspecified atrial fibrillation: Secondary | ICD-10-CM | POA: Diagnosis not present

## 2020-10-03 DIAGNOSIS — J189 Pneumonia, unspecified organism: Secondary | ICD-10-CM | POA: Diagnosis not present

## 2020-10-03 DIAGNOSIS — R7989 Other specified abnormal findings of blood chemistry: Secondary | ICD-10-CM | POA: Diagnosis not present

## 2020-10-03 DIAGNOSIS — J9621 Acute and chronic respiratory failure with hypoxia: Secondary | ICD-10-CM | POA: Diagnosis not present

## 2020-10-03 LAB — GLUCOSE, CAPILLARY
Glucose-Capillary: 257 mg/dL — ABNORMAL HIGH (ref 70–99)
Glucose-Capillary: 181 mg/dL — ABNORMAL HIGH (ref 70–99)
Glucose-Capillary: 167 mg/dL — ABNORMAL HIGH (ref 70–99)
Glucose-Capillary: 167 mg/dL — ABNORMAL HIGH (ref 70–99)

## 2020-10-03 LAB — BASIC METABOLIC PANEL
Anion gap: 11 (ref 5–15)
BUN: 54 mg/dL — ABNORMAL HIGH (ref 8–23)
CO2: 39 mmol/L — ABNORMAL HIGH (ref 22–32)
Calcium: 8.5 mg/dL — ABNORMAL LOW (ref 8.9–10.3)
Chloride: 79 mmol/L — ABNORMAL LOW (ref 98–111)
Creatinine, Ser: 0.85 mg/dL (ref 0.44–1.00)
GFR, Estimated: >60 mL/min (ref >60)
Glucose, Bld: 152 mg/dL — ABNORMAL HIGH (ref 70–99)
Potassium: 3.9 mmol/L (ref 3.5–5.1)
Sodium: 129 mmol/L — ABNORMAL LOW (ref 135–145)

## 2020-10-03 LAB — HEMOGLOBIN AND HEMATOCRIT, BLOOD
HCT: 51.9 % — ABNORMAL HIGH (ref 36.0–46.0)
Hemoglobin: 17.9 g/dL — ABNORMAL HIGH (ref 12.0–15.0)

## 2020-10-03 LAB — HEMOGLOBIN A1C
Hgb A1c MFr Bld: 6.6 % — ABNORMAL HIGH (ref 4.8–5.6)
Mean Plasma Glucose: 142.72 mg/dL

## 2020-10-03 LAB — BRAIN NATRIURETIC PEPTIDE: B Natriuretic Peptide: 430.5 pg/mL — ABNORMAL HIGH (ref 0.0–100.0)

## 2020-10-03 MED ORDER — METOPROLOL TARTRATE 5 MG/5ML IV SOLN
5.0000 mg | INTRAVENOUS | Status: DC | PRN
  Administered 2020-10-07 – 2020-10-09 (×10): 5 mg via INTRAVENOUS
  Filled 2020-10-03 (×10): qty 5

## 2020-10-03 MED ORDER — OXYBUTYNIN CHLORIDE 5 MG PO TABS: 5.0000 mg | ORAL_TABLET | Freq: Three times a day (TID) | ORAL | Status: DC | PRN

## 2020-10-03 MED ORDER — SODIUM CHLORIDE 0.9 % IR SOLN
3000.0000 mL | Status: DC
  Administered 2020-10-03 – 2020-10-04 (×5): 3000 mL

## 2020-10-03 MED ORDER — INSULIN ASPART 100 UNIT/ML IJ SOLN
0.0000 [IU] | Freq: Three times a day (TID) | INTRAMUSCULAR | Status: DC
Start: 2020-10-03 — End: 2020-10-18
  Administered 2020-10-03 – 2020-10-04 (×3): 2 [IU] via SUBCUTANEOUS
  Administered 2020-10-04 (×2): 1 [IU] via SUBCUTANEOUS
  Administered 2020-10-05: 2 [IU] via SUBCUTANEOUS
  Administered 2020-10-05: 1 [IU] via SUBCUTANEOUS
  Administered 2020-10-05: 2 [IU] via SUBCUTANEOUS
  Administered 2020-10-06: 3 [IU] via SUBCUTANEOUS
  Administered 2020-10-06 (×2): 1 [IU] via SUBCUTANEOUS
  Administered 2020-10-07: 2 [IU] via SUBCUTANEOUS
  Administered 2020-10-07: 1 [IU] via SUBCUTANEOUS
  Administered 2020-10-07 – 2020-10-08 (×2): 2 [IU] via SUBCUTANEOUS
  Administered 2020-10-08: 1 [IU] via SUBCUTANEOUS
  Administered 2020-10-09: 2 [IU] via SUBCUTANEOUS
  Administered 2020-10-09: 1 [IU] via SUBCUTANEOUS
  Administered 2020-10-09: 2 [IU] via SUBCUTANEOUS
  Administered 2020-10-10 – 2020-10-15 (×10): 1 [IU] via SUBCUTANEOUS

## 2020-10-03 MED ORDER — FUROSEMIDE 10 MG/ML IJ SOLN
60.0000 mg | Freq: Two times a day (BID) | INTRAMUSCULAR | Status: DC
  Administered 2020-10-03 – 2020-10-07 (×6): 60 mg via INTRAVENOUS
  Filled 2020-10-03 (×8): qty 6

## 2020-10-03 MED ORDER — FUROSEMIDE 10 MG/ML IJ SOLN: 40.0000 mg | Freq: Two times a day (BID) | INTRAMUSCULAR | Status: DC

## 2020-10-03 MED ORDER — BUDESONIDE 0.5 MG/2ML IN SUSP
RESPIRATORY_TRACT | Status: AC
  Filled 2020-10-03: qty 2

## 2020-10-03 MED ORDER — FLUCONAZOLE 100 MG PO TABS
200.0000 mg | ORAL_TABLET | Freq: Every day | ORAL | Status: DC
  Administered 2020-10-03 – 2020-10-07 (×5): 200 mg via ORAL
  Filled 2020-10-03 (×5): qty 2

## 2020-10-03 MED ORDER — METOPROLOL TARTRATE 25 MG PO TABS
25.0000 mg | ORAL_TABLET | Freq: Two times a day (BID) | ORAL | Status: DC
  Administered 2020-10-03 – 2020-10-08 (×8): 25 mg via ORAL
  Filled 2020-10-03 (×11): qty 1

## 2020-10-03 NOTE — Progress Notes (Signed)
PROGRESS NOTE    Kimberly Ballard  PJK:[id number] DOB: [birth year] DOA: 09/22/2020 PCP: System, Provider Not In    Brief Narrative:  Kimberly Ballard was admitted to the hospital with the working diagnosis of acute hypoxemic respiratory failure due to acute pulmonary edema due to decompensated diastolic heart failure.   80 year old female past medical COPD who presented with dyspnea.  Progressive shortness of breath.  August 18 diagnosed with COVID-19, started off oxygen.Kimberly Ballard  Despite outpatient therapy she continued to have progressive dyspnea and was brought to the hospital for further evaluation.  Her blood pressure was 127/77, respirate 20, temperature 98.2 oxygen saturation 93% her lungs had wheezing and rales heart S1-S2, present rhythmic abdomen soft nontender, no lower extremity edema.  Sodium 138, potassium 3.6, chloride 99, bicarb 28, glucose 125, BUN 69, creatinine 1.28, AST 503, ALP 439, Pro-Cal 0.48, white count 21.6, hemoglobin 15.6, hematocrit 47.4, platelets 283.  D-dimer 1.97, fibrinogen more than 800.  SARS COVID-19 negative  Chest radiograph with bilateral interstitial infiltrates at bases. CT chest negative for pulm embolism, dependent consolidation  within bilateral lower lobes.   EKG 77 bpm normal axis, normal intervals, sinus rhythm, Q-wave V1-V2, no ST segment changes, negative T wave V1-V4.  Patient was placed on supplemental oxygen per nasal cannula and antibiotic therapy, for suspected pneumonia.. She developed atrial fibrillation with rapid ventricular response, with worsening heart failure, placed on noninvasive mechanical ventilation.  Patient was placed on AV blockade and diureses for rate control volume overload. Her hospitalization was complicated by urinary tract infection with hematuria.  Assessment & Plan:   Active Problems:   CAP (community acquired pneumonia)   Acute respiratory failure with hypoxia and hypercapnia (HCC)   Sepsis (HCC)   Elevated LFTs    Cardiomegaly   Elevated troponin   Atrial fibrillation with RVR (HCC)   Acute and chronic respiratory failure (acute-on-chronic) (HCC)   Acute hypoxemic respiratory failure due to diastolic heart failure decompensation.  Oxymetry is 91% on 20 L.min per [place] and Fi02 60%, continue to have dyspnea, but not chest pain.  Urine output over last 24 hrs has been 1,980 ml, blood pressure systolic 102 mmHg.   Continue diuresis with furosemide increase to 60 mg IV q12 hrs to achieve negative fluid balance.   2. Atrial fibrillation with RVR. Patient was transitioned to oral diltiazem, this am with recurrent atrial fibrillation with RVR up to 140 bpm.  Added 25 mg metoprolol with improvement of heart rate. Continue rate control with AV blockade. Holding anticoagulation due to hematuria.   3. AKI, hyponatremia.  Stable renal function with serum cr at 0,85, K is 3,9 and Na is 129 Clinically continue volume overloaded., will continue with IV furosemide. Follow up renal function and electrolytes in am.   4. Hematuria/ NEW yeast urine infection. Continue bladder irrigation per urology. Will add oral fluconazole per urology recommendations Continue to hold on apixaban.   5. Community acquired pneumonia. Completed antibiotic therapy. No COPD exacerbation. Discontinue steroids.   6. Steroid induced hyperglycemia. Add sliding scale for glucose cover and monitoring.   Patient continue to be at high risk for worsening heart failure and atrial fibrillation   Status is: Inpatient  Remains inpatient appropriate because:Inpatient level of care appropriate due to severity of illness  Dispo: The patient is from: Home              Anticipated d/c is to: Home              Patient  currently is not medically stable to d/c.   Difficult to place patient No   DVT prophylaxis: Scd   Code Status:    DNR   Family Communication:  No family at the bedside      Consultants:  Cardiology  Urology    Procedures:  Bladder irrigation continuous   Antimicrobials:  Fluconazole     Subjective: Patient with dyspnea but not chest pain, continue to be very weak and deconditioned . Not hat her baseline, no high flow of supplemental 02   Objective: Vitals:   10/03/20 1000 10/03/20 1045 10/03/20 1100 10/03/20 1200  BP: 102/71  115/72 118/82  Pulse: (!) 59  84 86  Resp: (!) 28  (!) 27 (!) 27  Temp:    (!) 96.8 F (36 C)  TempSrc:    Axillary  SpO2: 92%  90% 92%  Weight:  86.5 kg    Height:        Intake/Output Summary (Last 24 hours) at 10/03/2020 1425 Last data filed at 10/03/2020 1200 Gross per 24 hour  Intake 520 ml  Output 2650 ml  Net -2130 ml   Filed Weights   09/22/20 2020 10/02/20 0821 10/03/20 1045  Weight: 90.9 kg 88.2 kg 86.5 kg    Examination:   General: deconditioned and ill looking appearing  Neurology: Awake and alert, non focal  E ENT: no pallor, no icterus, oral mucosa moist Cardiovascular: No JVD. S1-S2 present, irregularly irregular, no gallops, rubs, or murmurs. Trace lower extremity edema. Pulmonary: positive  breath sounds bilaterally, with wheezing, scattered rhonchi and rales. Gastrointestinal. Abdomen  soft and non tender Skin. No rashes Musculoskeletal: no joint deformities     Data Reviewed: I have personally reviewed following labs and imaging studies  CBC: Recent Labs  Lab 09/27/20 0311 09/28/20 0311 09/29/20 0311 09/30/20 0255 10/01/20 0249  WBC 15.1* 19.2* 17.6* 17.6* 20.2*  NEUTROABS 13.4* 17.3* 15.7* 15.9*  --   HGB 16.3* 17.6* 17.9* 18.1* 18.5*  HCT 50.2* 53.8* 53.6* 55.4* 56.0*  MCV 98.2 96.1 95.4 95.7 94.9  PLT 325 370 324 298 270   Basic Metabolic Panel: Recent Labs  Lab 09/27/20 0311 09/27/20 1824 09/28/20 0311 09/29/20 0311 09/30/20 0255 10/01/20 0249 10/02/20 0310 10/03/20 0255  NA 145   < > 140 139 137 132* 135 129*  K 4.2   < > 3.8 3.9 4.2 4.1 4.2 3.9  CL 96*   < > 88* 85* 84* 80* 81* 79*  CO2 41*   < >  39* 41* 43* 40* 42* 39*  GLUCOSE 175*   < > 152* 147* 165* 172* 137* 152*  BUN 38*   < > 46* 51* 45* 47* 41* 54*  CREATININE 0.57   < > 0.70 0.85 0.67 0.75 0.70 0.85  CALCIUM 8.8*   < > 8.7* 8.6* 8.9 9.0 9.0 8.5*  MG 2.4  --  2.4 2.3 2.3  --   --   --   PHOS 4.3  --  4.5 4.3 3.9  --   --   --    < > = values in this interval not displayed.   GFR: Estimated Creatinine Clearance: 55.9 mL/min (by C-G formula based on SCr of 0.85 mg/dL). Liver Function Tests: Recent Labs  Lab 09/27/20 0311 09/27/20 1824 09/28/20 0311 09/29/20 0311 09/30/20 0255  AST 35 42* 37 40 39  ALT 149* 146* 129* 112* 108*  ALKPHOS 75 89 78 68 65  BILITOT 0.7 1.1 0.8 1.0 1.2  PROT 6.4* 7.2 6.5 6.0* 6.1*  ALBUMIN 2.6* 3.0* 2.8* 2.7* 2.8*   Recent Labs  Lab 09/30/20 0250  LIPASE 54*   No results for input(s): AMMONIA in the last 168 hours. Coagulation Profile: No results for input(s): INR, PROTIME in the last 168 hours. Cardiac Enzymes: No results for input(s): CKTOTAL, CKMB, CKMBINDEX, TROPONINI in the last 168 hours. BNP (last 3 results) No results for input(s): PROBNP in the last 8760 hours. HbA1C: No results for input(s): HGBA1C in the last 72 hours. CBG: Recent Labs  Lab 09/27/20 1158 10/03/20 0925 10/03/20 1208  GLUCAP 183* 257* 181*   Lipid Profile: No results for input(s): CHOL, HDL, LDLCALC, TRIG, CHOLHDL, LDLDIRECT in the last 72 hours. Thyroid Function Tests: No results for input(s): TSH, T4TOTAL, FREET4, T3FREE, THYROIDAB in the last 72 hours. Anemia Panel: No results for input(s): VITAMINB12, FOLATE, FERRITIN, TIBC, IRON, RETICCTPCT in the last 72 hours.    Radiology Studies: I have reviewed all of the imaging during this hospital visit personally     Scheduled Meds:  apixaban  5 mg Oral BID   arformoterol  15 mcg Nebulization BID   budesonide (PULMICORT) nebulizer solution  0.5 mg Nebulization BID   chlorhexidine  15 mL Mouth Rinse BID   Chlorhexidine Gluconate Cloth  6  each Topical Daily   diltiazem  240 mg Oral Daily   furosemide  40 mg Intravenous Daily   guaiFENesin  600 mg Oral BID   insulin aspart  0-9 Units Subcutaneous TID WC   mouth rinse  15 mL Mouth Rinse q12n4p   metoprolol tartrate  25 mg Oral BID   polyethylene glycol  17 g Oral BID   predniSONE  50 mg Oral Q breakfast   Followed by   Melene Muller ON 10/06/2020] predniSONE  40 mg Oral Q breakfast   Followed by   Melene Muller ON 10/11/2020] predniSONE  30 mg Oral Q breakfast   Followed by   Melene Muller ON 10/16/2020] predniSONE  20 mg Oral Q breakfast   Followed by   Melene Muller ON 10/21/2020] predniSONE  10 mg Oral Q breakfast   revefenacin  175 mcg Nebulization Daily   Continuous Infusions:  sodium chloride Stopped (09/29/20 1824)   sodium chloride irrigation       LOS: 11 days        Ralphine Hinks Annett Gula, MD

## 2020-10-03 NOTE — Progress Notes (Signed)
OT Cancellation Note  Patient Details Name: Kimberly Ballard MRN: [id number] DOB: 1940/10/06   Cancelled Treatment:    Reason Eval/Treat Not Completed: Medical issues which prohibited therapy. RN reports patient with elevated HR and is now on bladder irrigation, starting new medication. Asked to hold therapy at this time. Will check back as schedule permits.  [name] OT OT pager: [phone]   [name] 10/03/2020, 11:41 AM

## 2020-10-03 NOTE — Progress Notes (Signed)
NAME:  Kimberly Ballard, MRN:  [id number], DOB:  [birth year], LOS: 11 ADMISSION DATE:  09/22/2020, CONSULTATION DATE:  09/23/2020 REFERRING MD:  Adela Glimpse - [facility] CHIEF COMPLAINT:  Dyspnea, cough   History of Present Illness:  80 year old woman presented to [facility] ED 8/22 with dyspnea, body aches, cough after a significant COVID exposure. PMHx significant for AF (on Eliquis), COPD and former tobacco abuse.  Per patient, her daughter was diagnosed with COVID and patient developed symptoms ~8/16.  She discussed concerns with her PCP and paxlovid was prescribed 8/18.  At [facility], she was tested for COVID twice (both negative PCRs) and IgG antibody was also negative. Per PCP notes, patient did receive the primary series of the COVID vaccine as well as 2 subsequent boosters.    Pertinent Medical History:  COPD Atrial fibrillation Former cigarette smoker  Significant Hospital Events: Including procedures, antibiotic start and stop dates in addition to other pertinent events   8/22 - Admit to SDU.  Cough weak.  Pulmonary asked to evaluate given concern for progressive respiratory failure.  COVID-19 and influenza negative. Respiratory viral panel negative.   IV ceftriaxone and azithromycin initiated.  Given IV Solu-Medrol followed by oral prednisone order. 8/23 - Pulmonary asked to evaluate given worsening respiratory failure.  CT chest showing lower lobe consolidation.  There was concern about possible aspiration.  Echo obtained showed good left ventricular function with mild pulmonary artery hypertension. RUQ ultrasound showed hepatic steatosis, possibly cirrhosis based on nodular liver contour, no gallbladder 8/24 - Cardiology consulted for elevated troponins.  This was felt secondary to worsening hypoxia and underlying pulmonary artery hypertension which was felt previously undiagnosed issue.  New onset atrial fibrillation with RVR.  CT angiogram ordered.  Increased oxygen requirement overnight, requiring BiPAP,  later placed on 15 L/min via high flow.  Lower extremity ultrasound negative for DVT 8/25 - Urine strep neg.  Still high FIO2. F/u COVID neg. Transitioned to oral CCB and DOAC. Getting IV lasix. Transitioned to headed High flow [place] during day and BIPAP at night.  8/26 - Still w/ overall net positive fluid balance over hospital stay. CXR w/ worse edema. Lasix increased to 80 mg q 12. Azithromycin stopped (day 5) Steroids reduced.  Cardiology signed off. (See note from cards w/ plan to follow as out-pt)  8/27 - Diuresed, CXR better, eating, got up to chair 8/28 -9/1 Remains on FIO2 80% despite aggressive diuresis. Net neg 11L. 9/02 Ongoing High FiO2 requirements. Net -13.7L/admission (-1.6L/24H). Tolerating HFNC/BiPAP and nebulization treatments. New hematuria for which Urology consulted, started CBI.  Interim History / Subjective:  FiO2 stable 70% (turned up to 100% for nebulizer treatment) Remains on 25 LPM Net negative volume status, -1.6L/24H, -13.7L/admission Urology initiated CBI for gross hematuria  Objective   Blood pressure 110/62, pulse 93, temperature (!) 96.8 F (36 C), temperature source Axillary, resp. rate (!) 30, height 5\' 3"  (1.6 m), weight 88.2 kg, SpO2 90 %.    FiO2 (%):  [70 %-100 %] 100 %   Intake/Output Summary (Last 24 hours) at 10/03/2020 1003 Last data filed at 10/03/2020 0900 Gross per 24 hour  Intake 520 ml  Output 1850 ml  Net -1330 ml    Filed Weights   09/22/20 2020 10/02/20 0821  Weight: 90.9 kg 88.2 kg   Physical Examination: General: Acutely ill-appearing elderly woman in NAD. Undergoing nebulizer treatment. HEENT: [place]/AT, anicteric sclera, PERRL, moist mucous membranes. Neuro: Awake, oriented x 4. Responds to verbal stimuli. Following commands consistently. Moves all  4 extremities spontaneously. CV: Mildly tachycardic, regular rhythm, no m/g/r. PULM: Breathing even and minimally labored on HFNC + neb (25LPM, FiO2 100%). Lung fields diminished bilaterally,  no wheeze/rhonchi or crackles noted. GI: Soft, nontender, nondistended. Normoactive bowel sounds. Extremities: Trace BLE edema noted. Skin: Warm/dry, no rashes.  Resolved Hospital Problem List:    Assessment & Plan:  Multifactorial Acute on chronic hypercapnic and hypoxemic respiratory failure with underlying COPD, emphysema seen on CT chest with atelectasis, pulmonary infiltrates in lower lobes due to community acquired pneumonia. 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 She has been adequately diuresed, completed antibiotics and steroids have not mitigated her O2 requirement. No evidence of cardiac shunt.  Autoimmune work-up negative except for slightly elevated SSB IgG ab which is non-specific. Working diagnosis is this may be a slow-to-resolve pneumonia +/- pulmonary/cardiac shunt. - Maintain in ICU with ongoing O2 needs - Continue HHFNC - Follow ABGs to facilitate FiO2 weaning as tolerated - Goal SpO2 > 85% with toleration of saturations > 75% with movement/exertion - Ongoing discussion regarding NIMV; this should be based on change in mental status or physical evidence of ventilatory failure (accessory muscle use, paradoxical breathing, nasal flaring, etc). 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 Acute decompensated diastolic heart failure - Diuresis per Cardiology - Cardiac monitoring  Atrial fibrillation with RVR - Cardiology following - Rate control per primary team - Continue Eliquis, though this may need to be held in the setting of hematuria - Cardiac monitoring  Gross hematuria - Urology following - CBI per Urology - Trend  H&H  Steroid induced hyperglycemia - Tapering steroids - SSI  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  She wants to continue to try to get out of the hospital but she doesn't want to go on life support or receive CPR. I called her son Arlys John to discuss and he agreed.]   Critical care time: N/A   Faythe Ghee [facility] 10/03/20 10:04 AM  Please see [email or web address] for pager details.  From 7A-7P if no response, please call [phone] After hours, please call ELink [phone]

## 2020-10-03 NOTE — Progress Notes (Addendum)
Urology Progress Note    Subjective: Kimberly Ballard.  Patient continued to have persistent hematuria and bladder fullness yesterday 9/1, so catheter was removed and TOV was attempted. Patient failed TOV w/ > 300 mL's in the bladder on scan. 50F (previously 80F) catheter was replaced w/ return of dark red urine. Urine remained dark and red throughout the night. This AM some gently flushing was attempted through the 50F; however, urine remained dark and cloudy.  Upsized catheter to 56F 3 way hematuria catheter with 10 mL's in the balloon. Irrigated with 500 cc's of fluid with no return of clots, but urine cleared to a clear very light pink. CBI was initiated at a slow drip.  CT A/P on 8/30 with a non-obstructing 9 mm left upper pole renal stone, but otherwise without evidence of GU pathology.   Ucx growing 40k colonies of yeast  Objective: Vital signs in last 24 hours: Temp:  [97 F (36.1 C)-98.4 F (36.9 C)] 97 F (36.1 C) (09/02 0346) Pulse Rate:  [25-166] 71 (09/02 0700) Resp:  [15-40] 18 (09/02 0700) BP: (104-127)/(59-87) 113/61 (09/02 0700) SpO2:  [85 %-95 %] 86 % (09/02 0700) FiO2 (%):  [70 %-80 %] 70 % (09/02 0249) Weight:  [88.2 kg] 88.2 kg (09/01 0821)  Intake/Output from previous day: 09/01 0701 - 09/02 0700 In: 360 [P.O.:360] Out: 1980 [Urine:1980] Intake/Output this shift: No intake/output data recorded.  Physical Exam:  General: Alert and oriented CV: Regular rate Lungs: No increased work of breathing Abdomen: Soft, non-tender. GU: Foley in place draining clear light pink urine on slow drip CBI  Ext: NT, No erythema  Lab Results: Recent Labs    10/01/20 0249  HGB 18.5*  HCT 56.0*   Recent Labs    10/02/20 0310 10/03/20 0255  NA 135 129*  K 4.2 3.9  CL 81* 79*  CO2 42* 39*  GLUCOSE 137* 152*  BUN 41* 54*  CREATININE 0.70 0.85  CALCIUM 9.0 8.5*    Studies/Results: ECHOCARDIOGRAM LIMITED BUBBLE STUDY  Result Date: 10/01/2020    ECHOCARDIOGRAM LIMITED  REPORT   Patient Name:   JAZMYNN Ballard Date of Exam: 10/01/2020 Medical Rec #:  [id number]     Height:       63.0 in Accession #:    [id number]    Weight:       200.4 lb Date of Birth:  1940/05/19    BSA:          1.935 m Patient Age:    80 years      BP:           116/83 mmHg Patient Gender: F             HR:           100 bpm. Exam Location:  Inpatient Procedure: Limited Echo, Color Doppler, Cardiac Doppler and Saline Contrast            Bubble Study Indications:    I48.91* Unspecified atrial fibrillation  History:        Patient has prior history of Echocardiogram examinations, most                 recent 09/23/2020. COPD; Arrythmias:Atrial Fibrillation.  Sonographer:    Andee Lineman Referring Phys: [id number] Kateri Mc LATIF [facility] IMPRESSIONS  1. Left ventricular ejection fraction, by estimation, is 60 to 65%. The left ventricle has normal function. There is moderate concentric left ventricular hypertrophy. Left ventricular diastolic function could not be evaluated.  2. The  mitral valve is grossly normal. Trivial mitral valve regurgitation. No evidence of mitral stenosis.  3. The aortic valve is tricuspid. There is mild calcification of the aortic valve. Aortic valve regurgitation is trivial. No aortic stenosis is present.  4. Agitated saline contrast bubble study was negative, with no evidence of any interatrial shunt. Conclusion(s)/Recommendation(s): Limited study, no major change from recent study 09/23/20. Bubble study negative for intraatrial shunt. FINDINGS  Left Ventricle: Left ventricular ejection fraction, by estimation, is 60 to 65%. The left ventricle has normal function. The left ventricular internal cavity size was normal in size. There is moderate concentric left ventricular hypertrophy. Left ventricular diastolic function could not be evaluated. Left ventricular diastolic function could not be evaluated due to atrial fibrillation. Pericardium: Presence of pericardial fat pad. Mitral Valve: The mitral  valve is grossly normal. Trivial mitral valve regurgitation. No evidence of mitral valve stenosis. Tricuspid Valve: The tricuspid valve is grossly normal. Tricuspid valve regurgitation is trivial. No evidence of tricuspid stenosis. Aortic Valve: The aortic valve is tricuspid. There is mild calcification of the aortic valve. Aortic valve regurgitation is trivial. No aortic stenosis is present. Aortic valve mean gradient measures 5.0 mmHg. Aortic valve peak gradient measures 6.9 mmHg. Aortic valve area, by VTI measures 2.39 cm. Pulmonic Valve: The pulmonic valve was grossly normal. Pulmonic valve regurgitation is trivial. No evidence of pulmonic stenosis. IAS/Shunts: No atrial level shunt detected by color flow Doppler. Agitated saline contrast was given intravenously to evaluate for intracardiac shunting. Agitated saline contrast bubble study was negative, with no evidence of any interatrial shunt. LEFT VENTRICLE PLAX 2D LVIDd:         4.00 cm     Diastology LVIDs:         2.60 cm     LV e' medial:  5.87 cm/s LV PW:         1.50 cm     LV e' lateral: 15.80 cm/s LV IVS:        1.70 cm LVOT diam:     1.90 cm LV SV:         50 LV SV Index:   26 LVOT Area:     2.84 cm  LV Volumes (MOD) LV vol d, MOD A4C: 34.9 ml LV vol s, MOD A4C: 13.7 ml LV SV MOD A4C:     34.9 ml RIGHT VENTRICLE RV S prime:     8.27 cm/s TAPSE (M-mode): 1.5 cm LEFT ATRIUM             Index       RIGHT ATRIUM           Index LA diam:        2.40 cm 1.24 cm/m  RA Area:     15.10 cm LA Vol (A2C):   64.6 ml 33.38 ml/m RA Volume:   44.40 ml  22.94 ml/m LA Vol (A4C):   42.2 ml 21.81 ml/m LA Biplane Vol: 56.2 ml 29.04 ml/m  AORTIC VALVE AV Area (Vmax):    2.19 cm AV Area (Vmean):   1.79 cm AV Area (VTI):     2.39 cm AV Vmax:           131.00 cm/s AV Vmean:          102.000 cm/s AV VTI:            0.211 m AV Peak Grad:      6.9 mmHg AV Mean Grad:      5.0 mmHg LVOT  Vmax:         101.00 cm/s LVOT Vmean:        64.300 cm/s LVOT VTI:          0.178 m  LVOT/AV VTI ratio: 0.84  AORTA Ao Root diam: 3.20 cm Ao Asc diam:  3.90 cm MITRAL VALVE MV Area (PHT): 2.93 cm SHUNTS                         Systemic VTI:  0.18 m                         Systemic Diam: 1.90 cm Jodelle Red MD Electronically signed by Jodelle Red MD Signature Date/Time: 10/01/2020/5:24:05 PM    Final     Assessment/Plan:  80 y.o. female s/p Hx of COPD admitted for multifocal pneumonia in the setting of a significant COVID exposure (notably COVID negative on testing) as well as new onset Afib w/ RVR now on Eliquis w/ new onset hematuria. Now w/ a large bore hematuria catheter and CBI running at slow drip.   - Recommend continuing CBI w/ drip titration to light pink through today. Will recheck this afternoon and discontinue if able.  - Recommend checking H/H once this morning. - Recommend holding Eliquis x 48-72 hours if feasible to give time for resolution of hematuria. Could also consider reducing dose to 2.5 mg BID.  - Recommend Ditropan 5 mg TID scheduled if patient is complaining of bladder fullness/spasms.  - Ucx growing 40k of Yeast. Consider 10-14 days of Fluconazole 200 mg daily PO for possible Candidal UTI.  Dispo: Per primary team   LOS: 11 days   Agree with above. Hold anticoagulant if OK from primary team standpoint. Will need to weigh r/b. Consider fluconazole foryeast in urine. Continue to monitor cbi

## 2020-10-03 NOTE — Progress Notes (Addendum)
Progress Note  Patient Name: Kimberly Ballard Date of Encounter: 10/03/2020  [facility] Cardiologist: Dr. Mayford Knife  Subjective   Now on high flow [place], breathing about the same  Inpatient Medications    Scheduled Meds:  apixaban  5 mg Oral BID   arformoterol  15 mcg Nebulization BID   budesonide (PULMICORT) nebulizer solution  0.5 mg Nebulization BID   chlorhexidine  15 mL Mouth Rinse BID   Chlorhexidine Gluconate Cloth  6 each Topical Daily   diltiazem  240 mg Oral Daily   furosemide  40 mg Intravenous Daily   guaiFENesin  600 mg Oral BID   mouth rinse  15 mL Mouth Rinse q12n4p   polyethylene glycol  17 g Oral BID   predniSONE  50 mg Oral Q breakfast   Followed by   Melene Muller ON 10/06/2020] predniSONE  40 mg Oral Q breakfast   Followed by   Melene Muller ON 10/11/2020] predniSONE  30 mg Oral Q breakfast   Followed by   Melene Muller ON 10/16/2020] predniSONE  20 mg Oral Q breakfast   Followed by   Melene Muller ON 10/21/2020] predniSONE  10 mg Oral Q breakfast   revefenacin  175 mcg Nebulization Daily   Continuous Infusions:  sodium chloride Stopped (09/29/20 1824)   PRN Meds: sodium chloride, levalbuterol, senna-docusate, simethicone, sodium chloride   Vital Signs    Vitals:   10/03/20 0300 10/03/20 0346 10/03/20 0400 10/03/20 0700  BP: 107/70  121/78 113/61  Pulse: 60  (!) 25 71  Resp: (!) 26  16 18   Temp:  (!) 97 F (36.1 C)    TempSrc:  Axillary    SpO2: (!) 88%  (!) 89% (!) 86%  Weight:      Height:        Intake/Output Summary (Last 24 hours) at 10/03/2020 0742 Last data filed at 10/03/2020 0400 Gross per 24 hour  Intake 360 ml  Output 1980 ml  Net -1620 ml   Last 3 Weights 10/02/2020 09/22/2020  Weight (lbs) 194 lb 7.1 oz 200 lb 6.4 oz  Weight (kg) 88.2 kg 90.901 kg      Telemetry    Atrial fib, HR elevated much of the time- Personally Reviewed  ECG    No new EKG tracing today. - Personally Reviewed  Physical Exam   GEN: No acute distress.   Neck: JVD 8 cm Cardiac:  Irreg R&R, no murmur, no rubs, or gallops.  Respiratory: diminished to auscultation bilaterally GI: Soft, nontender, non-distended  MS: No edema; No deformity. Neuro:  Nonfocal  Psych: Normal affect   Labs    High Sensitivity Troponin:   Recent Labs  Lab 09/22/20 2148 09/23/20 0207 09/24/20 0239  TROPONINIHS 128* 100* 52*      Chemistry Recent Labs  Lab 09/28/20 0311 09/29/20 0311 09/30/20 0255 10/01/20 0249 10/02/20 0310 10/03/20 0255  NA 140 139 137 132* 135 129*  K 3.8 3.9 4.2 4.1 4.2 3.9  CL 88* 85* 84* 80* 81* 79*  CO2 39* 41* 43* 40* 42* 39*  GLUCOSE 152* 147* 165* 172* 137* 152*  BUN 46* 51* 45* 47* 41* 54*  CREATININE 0.70 0.85 0.67 0.75 0.70 0.85  CALCIUM 8.7* 8.6* 8.9 9.0 9.0 8.5*  PROT 6.5 6.0* 6.1*  --   --   --   ALBUMIN 2.8* 2.7* 2.8*  --   --   --   AST 37 40 39  --   --   --   ALT 129*  112* 108*  --   --   --   ALKPHOS 78 68 65  --   --   --   BILITOT 0.8 1.0 1.2  --   --   --   GFRNONAA >60 >60 >60 >60 >60 >60  ANIONGAP 13 13 10 12 12 11      Hematology Recent Labs  Lab 09/29/20 0311 09/30/20 0255 10/01/20 0249  WBC 17.6* 17.6* 20.2*  RBC 5.62* 5.79* 5.90*  HGB 17.9* 18.1* 18.5*  HCT 53.6* 55.4* 56.0*  MCV 95.4 95.7 94.9  MCH 31.9 31.3 31.4  MCHC 33.4 32.7 33.0  RDW 13.4 13.4 13.2  PLT 324 298 270    BNP B Natriuretic Peptide  Date Value Ref Range Status  10/03/2020 430.5 (H) 0.0 - 100.0 pg/mL Final    Comment:    Performed at [facility], [address] [place], [place] [address]  09/26/2020 964.7 (H) 0.0 - 100.0 pg/mL Final    Comment:    Performed at [facility], [address] [place], [place] [address]  09/22/2020 889.7 (H) 0.0 - 100.0 pg/mL Final    Comment:    Performed at [facility], [address] [place], [place] [address]     Lab Results  Component Value Date   TSH 1.988 09/23/2020   No results found for: HGBA1C Lab Results  Component Value Date    CKTOTAL 60 09/22/2020   Lab Results  Component Value Date   DDIMER 2.04 (H) 09/23/2020     Radiology    CXR 08/29 IMPRESSION: Cardiomegaly with mild interstitial edema and small bilateral pleural effusions.   Superimposed mild patchy right lower lobe opacity, unchanged, atelectasis versus pneumonia.    ECHOCARDIOGRAM LIMITED BUBBLE STUDY  Result Date: 10/01/2020    ECHOCARDIOGRAM LIMITED REPORT   Patient Name:   Kimberly Ballard Date of Exam: 10/01/2020 Medical Rec #:  [id number]     Height:       63.0 in Accession #:    [id number]    Weight:       200.4 lb Date of Birth:  [birth year]    BSA:          1.935 m Patient Age:    79 years      BP:           116/83 mmHg Patient Gender: F             HR:           100 bpm. Exam Location:  Inpatient Procedure: Limited Echo, Color Doppler, Cardiac Doppler and Saline Contrast            Bubble Study Indications:    I48.91* Unspecified atrial fibrillation  History:        Patient has prior history of Echocardiogram examinations, most                 recent 09/23/2020. COPD; Arrythmias:Atrial Fibrillation.  Sonographer:    Andee LinemanMichael Hasty Referring Phys: [id number] Kateri McMAIR LATIF [facility] IMPRESSIONS  1. Left ventricular ejection fraction, by estimation, is 60 to 65%. The left ventricle has normal function. There is moderate concentric left ventricular hypertrophy. Left ventricular diastolic function could not be evaluated.  2. The mitral valve is grossly normal. Trivial mitral valve regurgitation. No evidence of mitral stenosis.  3. The aortic valve is tricuspid. There is mild calcification of the aortic valve. Aortic valve regurgitation is trivial. No aortic stenosis is present.  4.  Agitated saline contrast bubble study was negative, with no evidence of any interatrial shunt. Conclusion(s)/Recommendation(s): Limited study, no major change from recent study 09/23/20. Bubble study negative for intraatrial shunt. FINDINGS  Left Ventricle: Left ventricular ejection  fraction, by estimation, is 60 to 65%. The left ventricle has normal function. The left ventricular internal cavity size was normal in size. There is moderate concentric left ventricular hypertrophy. Left ventricular diastolic function could not be evaluated. Left ventricular diastolic function could not be evaluated due to atrial fibrillation. Pericardium: Presence of pericardial fat pad. Mitral Valve: The mitral valve is grossly normal. Trivial mitral valve regurgitation. No evidence of mitral valve stenosis. Tricuspid Valve: The tricuspid valve is grossly normal. Tricuspid valve regurgitation is trivial. No evidence of tricuspid stenosis. Aortic Valve: The aortic valve is tricuspid. There is mild calcification of the aortic valve. Aortic valve regurgitation is trivial. No aortic stenosis is present. Aortic valve mean gradient measures 5.0 mmHg. Aortic valve peak gradient measures 6.9 mmHg. Aortic valve area, by VTI measures 2.39 cm. Pulmonic Valve: The pulmonic valve was grossly normal. Pulmonic valve regurgitation is trivial. No evidence of pulmonic stenosis. IAS/Shunts: No atrial level shunt detected by color flow Doppler. Agitated saline contrast was given intravenously to evaluate for intracardiac shunting. Agitated saline contrast bubble study was negative, with no evidence of any interatrial shunt. LEFT VENTRICLE PLAX 2D LVIDd:         4.00 cm     Diastology LVIDs:         2.60 cm     LV e' medial:  5.87 cm/s LV PW:         1.50 cm     LV e' lateral: 15.80 cm/s LV IVS:        1.70 cm LVOT diam:     1.90 cm LV SV:         50 LV SV Index:   26 LVOT Area:     2.84 cm  LV Volumes (MOD) LV vol d, MOD A4C: 34.9 ml LV vol s, MOD A4C: 13.7 ml LV SV MOD A4C:     34.9 ml RIGHT VENTRICLE RV S prime:     8.27 cm/s TAPSE (M-mode): 1.5 cm LEFT ATRIUM             Index       RIGHT ATRIUM           Index LA diam:        2.40 cm 1.24 cm/m  RA Area:     15.10 cm LA Vol (A2C):   64.6 ml 33.38 ml/m RA Volume:   44.40 ml   22.94 ml/m LA Vol (A4C):   42.2 ml 21.81 ml/m LA Biplane Vol: 56.2 ml 29.04 ml/m  AORTIC VALVE AV Area (Vmax):    2.19 cm AV Area (Vmean):   1.79 cm AV Area (VTI):     2.39 cm AV Vmax:           131.00 cm/s AV Vmean:          102.000 cm/s AV VTI:            0.211 m AV Peak Grad:      6.9 mmHg AV Mean Grad:      5.0 mmHg LVOT Vmax:         101.00 cm/s LVOT Vmean:        64.300 cm/s LVOT VTI:          0.178 m LVOT/AV VTI ratio: 0.84  AORTA Ao Root diam: 3.20 cm Ao Asc diam:  3.90 cm MITRAL VALVE MV Area (PHT): 2.93 cm SHUNTS                         Systemic VTI:  0.18 m                         Systemic Diam: 1.90 cm Jodelle Red MD Electronically signed by Jodelle Red MD Signature Date/Time: 10/01/2020/5:24:05 PM    Final     Cardiac Studies   Complete Echo 09/23/2020: Impressions:  1. Left ventricular ejection fraction, by estimation, is 55 to 60%. Left  ventricular ejection fraction by 3D volume is 59 %. The left ventricle has normal function. The left ventricle has no regional wall motion  abnormalities. There is moderate left ventricular hypertrophy. Left ventricular diastolic parameters are consistent with Grade I diastolic dysfunction (impaired relaxation).   2. Right ventricular systolic function mildly reduced at base and mid,  with relative preserved apical systolic function. The right ventricular  size is moderately enlarged. There is moderately elevated pulmonary artery systolic pressure. The estimated right ventricular systolic pressure is 48.4 mmHg.   3. Left atrial size was mildly dilated.   4. Right atrial size was moderately dilated.   5. The mitral valve is normal in structure. Trivial mitral valve  regurgitation. No evidence of mitral stenosis.   6. Tricuspid valve regurgitation is mild to moderate.   7. The aortic valve is normal in structure. Aortic valve regurgitation is not visualized. No aortic stenosis is present.   8. The inferior vena cava is dilated  in size with <50% respiratory  variability, suggesting right atrial pressure of 15 mmHg.   Patient Profile     80 y.o. female with a history of COPD with prior tobacco use (quit in 2019) who was admitted on 09/22/2020 for acute hypoxic respiratory failure secondary to COPD exacerbation and multifocal pneumonia after presenting with shortness of breath, body aches, cough, and decreased appetite. Negative for COVID this admission, mult tests.   Cardiology consulted for elevated troponin. Did alter develop atrial fibrillation with RVR and was started on Cardizem and Eliquis with control of rates but not restoration of sinus rhythm Cardiology signed off on 09/26/2020 with plans to have patient follow-up in the A. Fib Clinc and likely DCCV after 3 or 4 weeks of anticoagulation. We were reconsulted 08/31 given recurrent RVR.  Assessment & Plan    New Onset Atrial Fibrillation with RVR - presented w/ acute hypoxic resp failure 2nd COPD/PNA/CHF - unknown duration - on Eliquis, new med - Cardizem at 240 mg qd.  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 - BNP has improved - CXR 08/29 w/ small effusions - no PE, neg COVID x 4, TB indeterminate - wt down 6 lbs as of 09/01, cont daily wts - Lasix 80 mg IV bid >> 40 mg IV qd on 09/01 - I/O incomplete but net -13.6 L since admit - prev wt approx 190 lbs, consider change to po Lasix once she reaches that - O2 req still very high, felt 2nd PNA/COPD >> per primary MD  Elevated Troponin - mild elevation c/w demand ischemia - nl EF and no WMA on echo - +coronary  calcium on chest CT >> can discuss MV as outpt  Otherwise, per primary team: - COPD Exacerbation - Multifocal pneumonia  - Abdominal pain - Transamnitis  - hematuria > Urologist contacted, felt  this was 2nd Eliquis, Urine culture, consider Ditropan, f/u as outpt   For questions or updates, please contact [facility] Please consult [email or web address] for contact info under     Signed, Theodore Demark, PA-C  10/03/2020, 7:42 AM     Patient seen and examined.  Agree with above documentation.  On exam, patient is alert and oriented, irregular, tachycardic, diminished breath sounds, no edema.  Had Afib with RVR today to 140s, but improved to 70s after PO metoprolol.  Metoprolol 25 mg BID started.   Little Ishikawa, MD

## 2020-10-03 NOTE — Progress Notes (Signed)
Walking by Pt's room and saw she was off the BiPAP and back on the HHFNC.  Questioned the Pt if she was ready/willing to go back on BiPAP and she stated no, "I want to stay on this [facility])."

## 2020-10-03 NOTE — Progress Notes (Signed)
Removed pt from [facility] and placed on BIPAP for the night. Pt is tolerating it well. HHFNC stand by.

## 2020-10-03 NOTE — Progress Notes (Addendum)
PT Cancellation Note  Patient Details Name: Kimberly Ballard MRN: [id number] DOB: 10/16/1940   Cancelled Treatment:     PT attempted but deferred at request of RN.  Pt with elevated HR and on continuous bladder irrigation.  Will follow.    Madailein Londo 10/03/2020, 1:24 PM

## 2020-10-03 NOTE — Progress Notes (Signed)
Pharmacy Antibiotic Note  Kimberly Ballard is a 80 y.o. female admitted on 09/22/2020 with pneumonia.  Pharmacy has been consulted for fluconazole dosing for possible candidal UTI as recommended by urology to consider.  Plan: Fluconazole 200 mg po daily for 14 days Pharmacy will sign off, following peripherally for culture results or dose adjustments.  Please reconsult if a change in clinical status warrants re-evaluation of dosage.     Height: 5\' 3"  (160 cm) Weight: 86.5 kg (190 lb 11.2 oz) IBW/kg (Calculated) : 52.4  Temp (24hrs), Avg:97.4 F (36.3 C), Min:96.8 F (36 C), Max:98.4 F (36.9 C)  Recent Labs  Lab 09/27/20 0311 09/27/20 1824 09/28/20 0311 09/29/20 0311 09/30/20 0255 10/01/20 0249 10/02/20 0310 10/03/20 0255  WBC 15.1*  --  19.2* 17.6* 17.6* 20.2*  --   --   CREATININE 0.57   < > 0.70 0.85 0.67 0.75 0.70 0.85   < > = values in this interval not displayed.    Estimated Creatinine Clearance: 55.9 mL/min (by C-G formula based on SCr of 0.85 mg/dL).    No Known Allergies   Microbiology results: 8/22 BCx: NGTD 8/31 UCx: 40,000 colonies/mL yeast   Thank you for allowing pharmacy to be a part of this patient's care.  9/31, PharmD, BCPS 10/03/2020 4:07 PM

## 2020-10-04 ENCOUNTER — Inpatient Hospital Stay (HOSPITAL_COMMUNITY): Payer: Medicare HMO

## 2020-10-04 DIAGNOSIS — R0600 Dyspnea, unspecified: Secondary | ICD-10-CM

## 2020-10-04 DIAGNOSIS — I2781 Cor pulmonale (chronic): Secondary | ICD-10-CM

## 2020-10-04 DIAGNOSIS — J9601 Acute respiratory failure with hypoxia: Secondary | ICD-10-CM | POA: Diagnosis not present

## 2020-10-04 DIAGNOSIS — J9621 Acute and chronic respiratory failure with hypoxia: Secondary | ICD-10-CM | POA: Diagnosis not present

## 2020-10-04 DIAGNOSIS — I5031 Acute diastolic (congestive) heart failure: Secondary | ICD-10-CM

## 2020-10-04 DIAGNOSIS — J441 Chronic obstructive pulmonary disease with (acute) exacerbation: Secondary | ICD-10-CM

## 2020-10-04 DIAGNOSIS — I4891 Unspecified atrial fibrillation: Secondary | ICD-10-CM | POA: Diagnosis not present

## 2020-10-04 DIAGNOSIS — R0602 Shortness of breath: Secondary | ICD-10-CM

## 2020-10-04 DIAGNOSIS — I2721 Secondary pulmonary arterial hypertension: Secondary | ICD-10-CM

## 2020-10-04 LAB — BASIC METABOLIC PANEL
Anion gap: 15 (ref 5–15)
BUN: 45 mg/dL — ABNORMAL HIGH (ref 8–23)
CO2: 40 mmol/L — ABNORMAL HIGH (ref 22–32)
Calcium: 8.4 mg/dL — ABNORMAL LOW (ref 8.9–10.3)
Chloride: 76 mmol/L — ABNORMAL LOW (ref 98–111)
Creatinine, Ser: 0.69 mg/dL (ref 0.44–1.00)
GFR, Estimated: >60 mL/min (ref >60)
Glucose, Bld: 123 mg/dL — ABNORMAL HIGH (ref 70–99)
Potassium: 3.6 mmol/L (ref 3.5–5.1)
Sodium: 131 mmol/L — ABNORMAL LOW (ref 135–145)

## 2020-10-04 LAB — GLUCOSE, CAPILLARY
Glucose-Capillary: 132 mg/dL — ABNORMAL HIGH (ref 70–99)
Glucose-Capillary: 139 mg/dL — ABNORMAL HIGH (ref 70–99)
Glucose-Capillary: 170 mg/dL — ABNORMAL HIGH (ref 70–99)
Glucose-Capillary: 141 mg/dL — ABNORMAL HIGH (ref 70–99)

## 2020-10-04 LAB — TROPONIN I (HIGH SENSITIVITY)
Troponin I (High Sensitivity): 59 ng/L — ABNORMAL HIGH (ref <18)
Troponin I (High Sensitivity): 67 ng/L — ABNORMAL HIGH (ref <18)

## 2020-10-04 LAB — CBC
HCT: 50.6 % — ABNORMAL HIGH (ref 36.0–46.0)
Hemoglobin: 17.5 g/dL — ABNORMAL HIGH (ref 12.0–15.0)
MCH: 31.8 pg (ref 26.0–34.0)
MCHC: 34.6 g/dL (ref 30.0–36.0)
MCV: 92 fL (ref 80.0–100.0)
Platelets: 247 10*3/uL (ref 150–400)
RBC: 5.5 MIL/uL — ABNORMAL HIGH (ref 3.87–5.11)
RDW: 12.8 % (ref 11.5–15.5)
WBC: 25.1 10*3/uL — ABNORMAL HIGH (ref 4.0–10.5)
nRBC: 0 % (ref 0.0–0.2)

## 2020-10-04 MED ORDER — MORPHINE SULFATE (PF) 2 MG/ML IV SOLN
2.0000 mg | Freq: Once | INTRAVENOUS | Status: AC
  Administered 2020-10-04: 2 mg via INTRAVENOUS
  Filled 2020-10-04: qty 1

## 2020-10-04 MED ORDER — GERHARDT'S BUTT CREAM
TOPICAL_CREAM | Freq: Three times a day (TID) | CUTANEOUS | Status: DC
  Administered 2020-10-06 – 2020-10-12 (×8): 1 via TOPICAL
  Filled 2020-10-04 (×4): qty 1

## 2020-10-04 MED ORDER — SODIUM CHLORIDE 0.9 % IV BOLUS
250.0000 mL | Freq: Once | INTRAVENOUS | Status: AC
  Administered 2020-10-04: 250 mL via INTRAVENOUS

## 2020-10-04 NOTE — Progress Notes (Signed)
Pt was eating and her SATs dropped. Placed Pt on 25 flow and 70%. RT will try to wean after the Pt eats.

## 2020-10-04 NOTE — Progress Notes (Signed)
RT was called in the room due to pt wanted to come off the bipap. Pt placed on the HHFNC 20l/60%. Pt is tolerating it well at this time. Pt states she would like to stay with HHFNC. Bipap stand by. RN aware.

## 2020-10-04 NOTE — Progress Notes (Signed)
Physical Therapy Treatment Patient Details Name: Kimberly Ballard MRN: [id number] DOB: 1940/10/06 Today's Date: 10/04/2020    History of Present Illness Patient is a 80 year old female presented on 09/22/20 with shortness of breath, body aches, decreased appetite and coughing. Per DTR patient diagnosed with COVID week previous to admission. PMH includes COPD.  CT chest ruled out PE but shows evidence of bilateral infiltrates.  Patient was started on Rocephin and Zithromax for community-acquired pneumonia.  Subsequently she decompensated and worsened and went into A. fib with RVR and was placed on BiPAP and admitted in the stepdown.   Patient also developed hematuria, urology was consulted and pt on CBI.    PT Comments    Pt agreeable to attempt mobility however became dizzy with sitting EOB and requested to return to supine.  BP 85/61 mmHg and RN notified.  Pt repositioned to comfort in supine.    Follow Up Recommendations  SNF     Equipment Recommendations  None recommended by PT    Recommendations for Other Services       Precautions / Restrictions Precautions Precautions: Fall Precaution Comments: monitor vitals; currently on HHFNC, 25L/min, 65%, also currently on CBI 9/3    Mobility  Bed Mobility Overal bed mobility: Needs Assistance Bed Mobility: Supine to Sit     Supine to sit: Mod assist;+2 for physical assistance Sit to supine: Mod assist;+2 for physical assistance   General bed mobility comments: pt able to bring LEs over EOB, assist required for trunk; pt dizzy and unable to remain EOB so assisted back to supine; BP 85/61 mmHg and RN notified; SPO2 91-93% on HHFNC (setting as above); HR remained 80-90s bpm    Transfers                    Ambulation/Gait                 Stairs             Wheelchair Mobility    Modified Rankin (Stroke Patients Only)       Balance                                             Cognition Arousal/Alertness: Awake/alert Behavior During Therapy: Flat affect Overall Cognitive Status: Within Functional Limits for tasks assessed                                        Exercises      General Comments        Pertinent Vitals/Pain Pain Assessment: No/denies pain Pain Intervention(s): Monitored during session;Repositioned    Home Living                      Prior Function            PT Goals (current goals can now be found in the care plan section) Acute Rehab PT Goals PT Goal Formulation: With patient Time For Goal Achievement: 10/18/20 Potential to Achieve Goals: Fair Progress towards PT goals: Not progressing toward goals - comment (limited by medical complexities (BP mostly today))    Frequency    Min 2X/week      PT Plan Current plan remains appropriate    Co-evaluation  AM-PAC PT "6 Clicks" Mobility   Outcome Measure  Help needed turning from your back to your side while in a flat bed without using bedrails?: A Lot Help needed moving from lying on your back to sitting on the side of a flat bed without using bedrails?: A Lot Help needed moving to and from a bed to a chair (including a wheelchair)?: A Lot Help needed standing up from a chair using your arms (e.g., wheelchair or bedside chair)?: A Lot Help needed to walk in hospital room?: A Lot Help needed climbing 3-5 steps with a railing? : Total 6 Click Score: 11    End of Session Equipment Utilized During Treatment: Oxygen Activity Tolerance: Patient limited by fatigue;Treatment limited secondary to medical complications (Comment) Patient left: in bed;with call bell/phone within reach;with bed alarm set Nurse Communication: Mobility status PT Visit Diagnosis: Muscle weakness (generalized) (M62.81)     Time: 1139-1150 PT Time Calculation (min) (ACUTE ONLY): 11 min  Charges:  $Therapeutic Activity: 8-22 mins                     Thomasene Mohair  PT, DPT Acute Rehabilitation Services Pager: [phone] Office: [phone]   Maida Sale E 10/04/2020, 1:45 PM

## 2020-10-04 NOTE — Progress Notes (Addendum)
PROGRESS NOTE    Kimberly Ballard  XLK:[id number] DOB: [birth year] DOA: 09/22/2020 PCP: System, Provider Not In   Brief Narrative:  This 80 years old female with PMH significant for COPD presented in the ED with worsening shortness of breath, decreased appetite and coughing.  Patient's daughter was diagnosed with COVID last week . she was started on paxloid empirically due to the risk of exposure but patient continued to get worse and developed shortness of breath.  In the ED she was requiring 6 L of oxygen. CT chest ruled out PE but shows evidence of bilateral infiltrates.  Patient was started on Rocephin and Zithromax for community-acquired pneumonia.  Subsequently she decompensated and worsened and went into A. fib with RVR and was placed on BiPAP and admitted in the stepdown.  Cardiology and pulmonology was consulted.  Patient was started on Cardizem drip and heparin gtt,  now heart rate is controlled,  transitioned to p.o. Cardizem.  She is weaned down to 15 L nonrebreather.  She was started on heparin and now transition to Eliquis.  Cardiology recommended continue Lasix for adequate diuresis. Respiratory status is slowly improving and after goals of care discussion with pulmonology she was made DNR.  Patient has developed hematuria , urology was consulted recommended to CBI.  Urine is getting clear, urine cultures grew Candida , started on Diflucan.  Assessment & Plan:   Active Problems:   CAP (community acquired pneumonia)   Acute respiratory failure with hypoxia and hypercapnia (HCC)   Sepsis (HCC)   Elevated LFTs   Cardiomegaly   Elevated troponin   Atrial fibrillation with RVR (HCC)   Acute and chronic respiratory failure (acute-on-chronic) (HCC)   Acute Hypoxic / Hypercapnic respiratory failure requiring noninvasive positive pressure ventilation with BiPAP: This could be multifactorial from underlying multifocal pneumonia and acute diastolic CHF. CTA chest ruled out PE, showed patchy  consolidative opacities consistent with multifocal pneumonia. Completed antibiotics for 5 days for CAP (ceftriaxone and Zithromax) Blood cultures no growth so far. Continued Lasix 60 mg twice daily as per cardio. Continue supplemental oxygen, remains on high flow oxygen 30L/min. Continue BiPAP at night and as needed. Pulmonology feels that this is clinically COVID given that hypoxemia and hypercapnia however now her isolation precautions have been discontinued. Covid negative x 2 Continue nebulization with Brovana/Pulmicort/Yupelri. Continue Xopenex every 4 hours for wheezing as needed.  Solu-Medrol discontinued. Pulmonology recommend maintaining in ICU, mobilizing getting up and out of bed.  Atrial fibrillation with RVR: Heart rate now controlled, desirable range. Continue Cardizem and metoprolol. Keep Eliquis on hold until hematuria resolves.  Acute diastolic CHF: Echo: LVEF 55 to 60%.  Continued to have bilateral crackles on exam. Cardiology recommended continuing diuresis with Lasix 60 mg twice daily. Strict intake / output charting, daily weight. Weight down 5 Lbs since admission.  Target weight 190 pounds  AKI: > Resolved. Presented with serum creatinine 1.28. Resolved.  Elevated troponins: Likely demand ischemia.   EKG no significant T wave changes. Cardiology: Reevaluate as an outpatient.  Community-acquired pneumonia: Completed antibiotics for 5 days, No COPD exacerbation. Steroids discontinued.  Elevated LFTs: > Resolved.  Right middle lobe nodule: Needs repeat CT chest in 3 months.  Hematuria: Urology consulted, started on CBI Hold Eliquis until hematuria resolves. Continue CBI , urine is getting clear.  New yeast infection: Continue Diflucan for 14 days.  DVT prophylaxis: SCDs Code Status: DNR Family Communication: No family at bed side. Disposition Plan:   Status is: Inpatient  Remains inpatient  appropriate because:Inpatient level of care  appropriate due to severity of illness  Dispo: The patient is from: Home              Anticipated d/c is to: SNF              Patient currently is not medically stable to d/c.   Difficult to place patient No  Consultants:  Pulmonology and cardiology Urology  Procedures:  Antimicrobials:   Anti-infectives (From admission, onward)    Start     Dose/Rate Route Frequency Ordered Stop   10/03/20 1700  fluconazole (DIFLUCAN) tablet 200 mg        200 mg Oral Daily 10/03/20 1606 10/17/20 0959   09/23/20 1800  cefTRIAXone (ROCEPHIN) 2 g in sodium chloride 0.9 % 100 mL IVPB        2 g 200 mL/hr over 30 Minutes Intravenous Every 24 hours 09/22/20 1842 09/29/20 1749   09/23/20 1800  azithromycin (ZITHROMAX) 500 mg in sodium chloride 0.9 % 250 mL IVPB  Status:  Discontinued        500 mg 250 mL/hr over 60 Minutes Intravenous Every 24 hours 09/22/20 1842 09/25/20 1456   09/22/20 1630  cefTRIAXone (ROCEPHIN) 1 g in sodium chloride 0.9 % 100 mL IVPB        1 g 200 mL/hr over 30 Minutes Intravenous  Once 09/22/20 1618 09/22/20 1806   09/22/20 1630  azithromycin (ZITHROMAX) 500 mg in sodium chloride 0.9 % 250 mL IVPB        500 mg 250 mL/hr over 60 Minutes Intravenous  Once 09/22/20 1618 09/22/20 1939        Subjective: Patient was seen and examined at bedside.  Overnight events noted. She reports feeling better, could not tolerate BiPAP last night.   She remains on high flow oxygen.  Urine is getting clear, she remains on CBI.   She was  having breakfast.  Heart rate is now controlled.  Objective: Vitals:   10/04/20 0945 10/04/20 1000 10/04/20 1015 10/04/20 1030  BP:  111/69    Pulse: 74 60 72 75  Resp: (!) 26 19 (!) 31 (!) 26  Temp:      TempSrc:      SpO2: 95% 94% 95% 95%  Weight:      Height:        Intake/Output Summary (Last 24 hours) at 10/04/2020 1110 Last data filed at 10/04/2020 0700 Gross per 24 hour  Intake 24235 ml  Output 11725 ml  Net 3545 ml   Filed Weights    10/02/20 0821 10/03/20 1045 10/04/20 0500  Weight: 88.2 kg 86.5 kg 87.1 kg    Examination:  General exam: Appears chronically ill, deconditioned, not in any acute distress.  Still remains on HFNC  Respiratory system: Clear to auscultation bilaterally, respiratory effort normal, RR 17 Cardiovascular system: S1-S2 heard, Irregular rate and rhythm, no murmur. Gastrointestinal system: Abdomen is nondistended, soft and nontender. No organomegaly or masses felt. Normal bowel sounds heard. Central nervous system: Alert and oriented x 2. No focal neurological deficits. Extremities: No edema, no cyanosis, no clubbing. Skin: No rashes, lesions or ulcers Psychiatry: Mood and affect appropriate.    Data Reviewed: I have personally reviewed following labs and imaging studies  CBC: Recent Labs  Lab 09/28/20 0311 09/29/20 0311 09/30/20 0255 10/01/20 0249 10/03/20 1532 10/04/20 0230  WBC 19.2* 17.6* 17.6* 20.2*  --  25.1*  NEUTROABS 17.3* 15.7* 15.9*  --   --   --  HGB 17.6* 17.9* 18.1* 18.5* 17.9* 17.5*  HCT 53.8* 53.6* 55.4* 56.0* 51.9* 50.6*  MCV 96.1 95.4 95.7 94.9  --  92.0  PLT 370 324 298 270  --  247   Basic Metabolic Panel: Recent Labs  Lab 09/28/20 0311 09/29/20 0311 09/30/20 0255 10/01/20 0249 10/02/20 0310 10/03/20 0255 10/04/20 0230  NA 140 139 137 132* 135 129* 131*  K 3.8 3.9 4.2 4.1 4.2 3.9 3.6  CL 88* 85* 84* 80* 81* 79* 76*  CO2 39* 41* 43* 40* 42* 39* 40*  GLUCOSE 152* 147* 165* 172* 137* 152* 123*  BUN 46* 51* 45* 47* 41* 54* 45*  CREATININE 0.70 0.85 0.67 0.75 0.70 0.85 0.69  CALCIUM 8.7* 8.6* 8.9 9.0 9.0 8.5* 8.4*  MG 2.4 2.3 2.3  --   --   --   --   PHOS 4.5 4.3 3.9  --   --   --   --    GFR: Estimated Creatinine Clearance: 59.7 mL/min (by C-G formula based on SCr of 0.69 mg/dL). Liver Function Tests: Recent Labs  Lab 09/27/20 1824 09/28/20 0311 09/29/20 0311 09/30/20 0255  AST 42* 37 40 39  ALT 146* 129* 112* 108*  ALKPHOS 89 78 68 65   BILITOT 1.1 0.8 1.0 1.2  PROT 7.2 6.5 6.0* 6.1*  ALBUMIN 3.0* 2.8* 2.7* 2.8*   Recent Labs  Lab 09/30/20 0250  LIPASE 54*   No results for input(s): AMMONIA in the last 168 hours. Coagulation Profile: No results for input(s): INR, PROTIME in the last 168 hours. Cardiac Enzymes: No results for input(s): CKTOTAL, CKMB, CKMBINDEX, TROPONINI in the last 168 hours. BNP (last 3 results) No results for input(s): PROBNP in the last 8760 hours. HbA1C: Recent Labs    10/03/20 1226  HGBA1C 6.6*   CBG: Recent Labs  Lab 10/03/20 0925 10/03/20 1208 10/03/20 1604 10/03/20 2204 10/04/20 0744  GLUCAP 257* 181* 167* 167* 132*   Lipid Profile: No results for input(s): CHOL, HDL, LDLCALC, TRIG, CHOLHDL, LDLDIRECT in the last 72 hours. Thyroid Function Tests: No results for input(s): TSH, T4TOTAL, FREET4, T3FREE, THYROIDAB in the last 72 hours. Anemia Panel: No results for input(s): VITAMINB12, FOLATE, FERRITIN, TIBC, IRON, RETICCTPCT in the last 72 hours. Sepsis Labs: No results for input(s): PROCALCITON, LATICACIDVEN in the last 168 hours.  Recent Results (from the past 240 hour(s))  Resp Panel by RT-PCR (Flu A&B, Covid) Nasopharyngeal Swab     Status: None   Collection Time: 09/24/20  2:21 PM   Specimen: Nasopharyngeal Swab; Nasopharyngeal(NP) swabs in vial transport medium  Result Value Ref Range Status   SARS Coronavirus 2 by RT PCR NEGATIVE NEGATIVE Final    Comment: (NOTE) SARS-CoV-2 target nucleic acids are NOT DETECTED.  The SARS-CoV-2 RNA is generally detectable in upper respiratory specimens during the acute phase of infection. The lowest concentration of SARS-CoV-2 viral copies this assay can detect is 138 copies/mL. A negative result does not preclude SARS-Cov-2 infection and should not be used as the sole basis for treatment or other patient management decisions. A negative result may occur with  improper specimen collection/handling, submission of specimen  other than nasopharyngeal swab, presence of viral mutation(s) within the areas targeted by this assay, and inadequate number of viral copies(<138 copies/mL). A negative result must be combined with clinical observations, patient history, and epidemiological information. The expected result is Negative.  Fact Sheet for Patients:  BloggerCourse.com  Fact Sheet for Healthcare Providers:  SeriousBroker.it  This test is  no t yet approved or cleared by the [place] FDA and  has been authorized for detection and/or diagnosis of SARS-CoV-2 by FDA under an Emergency Use Authorization (EUA). This EUA will remain  in effect (meaning this test can be used) for the duration of the COVID-19 declaration under Section 564(b)(1) of the Act, 21 U.S.C.section 360bbb-3(b)(1), unless the authorization is terminated  or revoked sooner.       Influenza A by PCR NEGATIVE NEGATIVE Final   Influenza B by PCR NEGATIVE NEGATIVE Final    Comment: (NOTE) The Xpert Xpress SARS-CoV-2/FLU/RSV plus assay is intended as an aid in the diagnosis of influenza from Nasopharyngeal swab specimens and should not be used as a sole basis for treatment. Nasal washings and aspirates are unacceptable for Xpert Xpress SARS-CoV-2/FLU/RSV testing.  Fact Sheet for Patients: BloggerCourse.comhttps://www.fda.gov/media/152166/download  Fact Sheet for Healthcare Providers: SeriousBroker.ithttps://www.fda.gov/media/152162/download  This test is not yet approved or cleared by the [place] FDA and has been authorized for detection and/or diagnosis of SARS-CoV-2 by FDA under an Emergency Use Authorization (EUA). This EUA will remain in effect (meaning this test can be used) for the duration of the COVID-19 declaration under Section 564(b)(1) of the Act, 21 U.S.C. section 360bbb-3(b)(1), unless the authorization is terminated or revoked.  Performed at [facility], [address] [place], [place] [address]   Urine Culture     Status: Abnormal   Collection Time: 10/01/20 10:13 AM   Specimen: Urine, Catheterized  Result Value Ref Range Status   Specimen Description   Final    URINE, CATHETERIZED Performed at [facility], [address] [place], [place] [address]    Special Requests   Final    NONE Performed at [facility], [address] [place], [place] [address]    Culture 40,000 COLONIES/mL YEAST (A)  Final   Report Status 10/02/2020 FINAL  Final    Radiology Studies: No results found.  Scheduled Meds:  arformoterol  15 mcg Nebulization BID   budesonide (PULMICORT) nebulizer solution  0.5 mg Nebulization BID   chlorhexidine  15 mL Mouth Rinse BID   Chlorhexidine Gluconate Cloth  6 each Topical Daily   diltiazem  240 mg Oral Daily   fluconazole  200 mg Oral Daily   furosemide  60 mg Intravenous BID   Gerhardt's butt cream   Topical TID   guaiFENesin  600 mg Oral BID   insulin aspart  0-9 Units Subcutaneous TID WC   mouth rinse  15 mL Mouth Rinse q12n4p   metoprolol tartrate  25 mg Oral BID   polyethylene glycol  17 g Oral BID   revefenacin  175 mcg Nebulization Daily   Continuous Infusions:  sodium chloride Stopped (09/29/20 1824)   sodium chloride irrigation       LOS: 12 days    Time spent: 35 mins.    Cipriano BunkerPARDEEP Yeng Frankie, MD [facility]   If 7PM-7AM, please contact night-coverage

## 2020-10-04 NOTE — Progress Notes (Signed)
  Subjective: No acute events overnight.  Patient with no complaints and resting comfortably.  Per the nursing staff, her urine has remained clear to light pink with light CBI.  H/H stable CT abdomen/pelvis from 09/30/2020 showed only a nonobstructing left renal stone with no other GU abnormalities to explain her hematuria  Objective: Vital signs in last 24 hours: Temp:  [96 F (35.6 C)-98.1 F (36.7 C)] 96 F (35.6 C) (09/03 0411) Pulse Rate:  [50-102] 57 (09/03 0600) Resp:  [14-37] 21 (09/03 0600) BP: (93-138)/(52-88) 103/63 (09/03 0600) SpO2:  [85 %-97 %] 97 % (09/03 0600) FiO2 (%):  [60 %-100 %] 60 % (09/03 0411) Weight:  [86.5 kg-87.1 kg] 87.1 kg (09/03 0500)  Intake/Output from previous day: 09/02 0701 - 09/03 0700 In: 62130 [P.O.:120] Out: 12775 [Urine:12775]  Intake/Output this shift: No intake/output data recorded.  Physical Exam:  GU: Foley catheter in place and draining clear to light pink with slow CBI drip  Lab Results: Recent Labs    10/03/20 1532 10/04/20 0230  HGB 17.9* 17.5*  HCT 51.9* 50.6*   BMET Recent Labs    10/03/20 0255 10/04/20 0230  NA 129* 131*  K 3.9 3.6  CL 79* 76*  CO2 39* 40*  GLUCOSE 152* 123*  BUN 54* 45*  CREATININE 0.85 0.69  CALCIUM 8.5* 8.4*     Studies/Results: No results found.  Assessment/Plan: 80 y.o. female s/p Hx of COPD admitted for multifocal pneumonia in the setting of a significant COVID exposure (notably COVID negative on testing) as well as new onset Afib w/ RVR now on Eliquis w/ new onset hematuria.  -Continue CBI and flush Foley catheter as needed   LOS: 12 days   Rhoderick Moody, MD [facility] Pager: [phone]  10/04/2020, 7:46 AM

## 2020-10-04 NOTE — Progress Notes (Signed)
Pt refused to wear bipap tonight and would like to stay with HHFNC . No resp distress noted. Pt is comfortably resting. Bipap stand by and RN aware.

## 2020-10-04 NOTE — Consult Note (Signed)
WOC Nurse Consult Note: Reason for Consult:MASD to sacrum, 1cm area of non blanching erythema (Stage 1 PI) Wound type:moisture plus pressure Pressure Injury POA: Yes Measurement:1cm round Wound bed:N/A Drainage (amount, consistency, odor) N/A Periwound: intact, moist Dressing procedure/placement/frequency: Patient is on a mattress replacement with low air loss feature and is being turned and repositioned per house protocol. An indwelling urinary catheter is in place post op.  Guidance is provided to minimize time in the supine position, float heels using bilateral pressure redistribution heel boots. [name]'s Butt Cream, a compounded prescriptive of 1:1:1 Zinc oxide:lotrimin:hydrocortisone is to be applied to the buttocks, posterior and medial thighs. A silicone foam is to be placed to the sacrum.  WOC nursing team will not follow, but will remain available to this patient, the nursing and medical teams.  Please re-consult if needed. Thanks, Ladona Mow, MSN, RN, GNP, [facility]  Pager# [phone]

## 2020-10-04 NOTE — Progress Notes (Signed)
Progress Note  Patient Name: Kimberly Ballard Date of Encounter: 10/04/2020  [facility] Cardiologist: None Turner  Subjective   Feeling a little better. Off Bipap since last night. Still drops sats precipitously when eating. No palpitations/arrhythmia awareness.  Inpatient Medications    Scheduled Meds:  arformoterol  15 mcg Nebulization BID   budesonide (PULMICORT) nebulizer solution  0.5 mg Nebulization BID   chlorhexidine  15 mL Mouth Rinse BID   Chlorhexidine Gluconate Cloth  6 each Topical Daily   diltiazem  240 mg Oral Daily   fluconazole  200 mg Oral Daily   furosemide  60 mg Intravenous BID   guaiFENesin  600 mg Oral BID   insulin aspart  0-9 Units Subcutaneous TID WC   mouth rinse  15 mL Mouth Rinse q12n4p   metoprolol tartrate  25 mg Oral BID   polyethylene glycol  17 g Oral BID   revefenacin  175 mcg Nebulization Daily   Continuous Infusions:  sodium chloride Stopped (09/29/20 1824)   sodium chloride irrigation     PRN Meds: sodium chloride, levalbuterol, metoprolol tartrate, oxybutynin, senna-docusate, simethicone, sodium chloride   Vital Signs    Vitals:   10/04/20 0400 10/04/20 0411 10/04/20 0500 10/04/20 0600  BP: 93/60 103/68 120/88 103/63  Pulse: (!) 54 77 65 (!) 57  Resp: (!) 27 20 19  (!) 21  Temp: (!) 97.4 F (36.3 C) (!) 96 F (35.6 C)    TempSrc: Oral Axillary    SpO2: 94% 96% 94% 97%  Weight:      Height:        Intake/Output Summary (Last 24 hours) at 10/04/2020 0618 Last data filed at 10/04/2020 0600 Gross per 24 hour  Intake 12/04/2020 ml  Output 11775 ml  Net -1355 ml   Last 3 Weights 10/03/2020 10/02/2020 09/22/2020  Weight (lbs) 190 lb 11.2 oz 194 lb 7.1 oz 200 lb 6.4 oz  Weight (kg) 86.5 kg 88.2 kg 90.901 kg      Telemetry    AFib 80-100 - Personally Reviewed  ECG    09/22/2020 - NSR, anterior T wave inversion  10/03/2020 - Afib w RVR, inferior T wave inversion - Personally Reviewed  Physical Exam  Appears weak, chronically  ill GEN: No acute distress.   Neck: No JVD Cardiac: irregular, no murmurs, rubs, or gallops.  Respiratory: Clear to auscultation bilaterally. GI: Soft, nontender, non-distended  MS: No edema; No deformity. Neuro:  Nonfocal  Psych: Normal affect   Labs    High Sensitivity Troponin:   Recent Labs  Lab 09/22/20 2148 09/23/20 0207 09/24/20 0239  TROPONINIHS 128* 100* 52*      Chemistry Recent Labs  Lab 09/28/20 0311 09/29/20 0311 09/30/20 0255 10/01/20 0249 10/02/20 0310 10/03/20 0255 10/04/20 0230  NA 140 139 137   < > 135 129* 131*  K 3.8 3.9 4.2   < > 4.2 3.9 3.6  CL 88* 85* 84*   < > 81* 79* 76*  CO2 39* 41* 43*   < > 42* 39* 40*  GLUCOSE 152* 147* 165*   < > 137* 152* 123*  BUN 46* 51* 45*   < > 41* 54* 45*  CREATININE 0.70 0.85 0.67   < > 0.70 0.85 0.69  CALCIUM 8.7* 8.6* 8.9   < > 9.0 8.5* 8.4*  PROT 6.5 6.0* 6.1*  --   --   --   --   ALBUMIN 2.8* 2.7* 2.8*  --   --   --   --  AST 37 40 39  --   --   --   --   ALT 129* 112* 108*  --   --   --   --   ALKPHOS 78 68 65  --   --   --   --   BILITOT 0.8 1.0 1.2  --   --   --   --   GFRNONAA >60 >60 >60   < > >60 >60 >60  ANIONGAP 13 13 10    < > 12 11 15    < > = values in this interval not displayed.     Hematology Recent Labs  Lab 09/30/20 0255 10/01/20 0249 10/03/20 1532 10/04/20 0230  WBC 17.6* 20.2*  --  25.1*  RBC 5.79* 5.90*  --  5.50*  HGB 18.1* 18.5* 17.9* 17.5*  HCT 55.4* 56.0* 51.9* 50.6*  MCV 95.7 94.9  --  92.0  MCH 31.3 31.4  --  31.8  MCHC 32.7 33.0  --  34.6  RDW 13.4 13.2  --  12.8  PLT 298 270  --  247    BNP Recent Labs  Lab 10/03/20 0255  BNP 430.5*     DDimer No results for input(s): DDIMER in the last 168 hours.   Radiology    No results found.  Cardiac Studies   Complete Echo 09/23/2020: Impressions:  1. Left ventricular ejection fraction, by estimation, is 55 to 60%. Left  ventricular ejection fraction by 3D volume is 59 %. The left ventricle has normal  function. The left ventricle has no regional wall motion  abnormalities. There is moderate left ventricular hypertrophy. Left ventricular diastolic parameters are consistent with Grade I diastolic dysfunction (impaired relaxation).   2. Right ventricular systolic function mildly reduced at base and mid,  with relative preserved apical systolic function. The right ventricular  size is moderately enlarged. There is moderately elevated pulmonary artery systolic pressure. The estimated right ventricular systolic pressure is 48.4 mmHg.   3. Left atrial size was mildly dilated.   4. Right atrial size was moderately dilated.   5. The mitral valve is normal in structure. Trivial mitral valve  regurgitation. No evidence of mitral stenosis.   6. Tricuspid valve regurgitation is mild to moderate.   7. The aortic valve is normal in structure. Aortic valve regurgitation is not visualized. No aortic stenosis is present.   8. The inferior vena cava is dilated in size with <50% respiratory  variability, suggesting right atrial pressure of 15 mmHg.   Follow-up limited study 10/01/2020 Limited study, no major change from  recent study 09/23/20. Bubble study negative for intraatrial shunt  Patient Profile     80 y.o. female with a history of COPD (60-pack-year, quit smoking 2019) admitted on 09/22/2020 for acute hypoxic respiratory failure secondary to COPD exacerbation and multifocal pneumonia (received Paxlovid 08/18 after daughter had COVID; presented with shortness of breath, body aches, cough, and decreased appetite, multiple negative COVID tests). Later developed atrial fibrillation with RVR treated with anticoagulation and rate control (reconsulted 08/31 for recurrent RVR).    Assessment & Plan    AFib w RVR: NSR on admission. On anticoagulation. CHADSVasc 5 (age 22. Gender, CHF, +/-CAD) . Rate control is now in desired range. Acute diastolic HF: "moderate" LVH on echo (mild by measurements), but no AS or  history of HTN. Consider amyloidosis, but diastolic parameters when she was in NSR were not that bad. BNP elevated.964, substantially improved 430 yesterday (baseline unknown). Dry weight presumed 190  lb (not sure; note weight 210 last November in office). 192 lb today. Would recommend keeping "even" in/out balance. Coronary atherosclerosis: calcifications on CT, minor troponin leak c/w demand ischemia. No regional wall motion abn. Nonspecific ECG repol changes. No angina. Reevaluate as OP. COPD exacerbation: preceded by viral sd. Major cause of decompensation. Cor pulmonale: dilated RV/reduced RV function, dilated RA c/w chronic cor pulmonale on echo at presentation. 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 No PE on CTA 8/22 and 8/24.     For questions or updates, please contact [facility] Please consult [email or web address] for contact info under        Signed, Thurmon Fair, MD  10/04/2020, 6:18 AM

## 2020-10-04 NOTE — Progress Notes (Addendum)
NAME:  Kimberly Ballard, MRN:  [id number], DOB:  [birth year], LOS: 12 ADMISSION DATE:  09/22/2020, CONSULTATION DATE:  09/23/2020 REFERRING MD:  Adela Glimpse - [facility] CHIEF COMPLAINT:  Dyspnea, cough   History of Present Illness:  80 year old woman presented to [facility] ED 8/22 with dyspnea, body aches, cough after a significant COVID exposure. PMHx significant for AF (on Eliquis), COPD and former tobacco abuse.  Per patient, her daughter was diagnosed with COVID and patient developed symptoms ~8/16.  She discussed concerns with her PCP and paxlovid was prescribed 8/18.  At [facility], she was tested for COVID twice (both negative PCRs) and IgG antibody was also negative. Per PCP notes, patient did receive the primary series of the COVID vaccine as well as 2 subsequent boosters.    Pertinent Medical History:  COPD Atrial fibrillation Former cigarette smoker  Significant Hospital Events: Including procedures, antibiotic start and stop dates in addition to other pertinent events   8/22 - Admit to SDU.  Cough weak.  Pulmonary asked to evaluate given concern for progressive respiratory failure.  COVID-19 and influenza negative. Respiratory viral panel negative.   IV ceftriaxone and azithromycin initiated.  Given IV Solu-Medrol followed by oral prednisone order. 8/23 - Pulmonary asked to evaluate given worsening respiratory failure.  CT chest showing lower lobe consolidation.  There was concern about possible aspiration.  Echo obtained showed good left ventricular function with mild pulmonary artery hypertension. RUQ ultrasound showed hepatic steatosis, possibly cirrhosis based on nodular liver contour, no gallbladder 8/24 - Cardiology consulted for elevated troponins.  This was felt secondary to worsening hypoxia and underlying pulmonary artery hypertension which was felt previously undiagnosed issue.  New onset atrial fibrillation with RVR.  CT angiogram ordered.  Increased oxygen requirement overnight, requiring BiPAP,  later placed on 15 L/min via high flow.  Lower extremity ultrasound negative for DVT 8/25 - Urine strep neg.  Still high FIO2. F/u COVID neg. Transitioned to oral CCB and DOAC. Getting IV lasix. Transitioned to headed High flow [place] during day and BIPAP at night.  8/26 - Still w/ overall net positive fluid balance over hospital stay. CXR w/ worse edema. Lasix increased to 80 mg q 12. Azithromycin stopped (day 5) Steroids reduced.  Cardiology signed off. (See note from cards w/ plan to follow as out-pt)  8/27 - Diuresed, CXR better, eating, got up to chair 8/28 -9/1 Remains on FIO2 80% despite aggressive diuresis. Net neg 11L. 9/02 Ongoing High FiO2 requirements. Net -13.7L/admission (-1.6L/24H). Tolerating HFNC/BiPAP and nebulization treatments. New hematuria for which Urology consulted, started CBI.  Interim History / Subjective:  No acute events overnight, slight decrease in oxygen to 60 to 65% from 70%.  Objective   Blood pressure (!) 99/57, pulse 62, temperature (!) 96.6 F (35.9 C), temperature source Oral, resp. rate (!) 36, height 5\' 3"  (1.6 m), weight 87.1 kg, SpO2 91 %.    FiO2 (%):  [55 %-70 %] 65 %   Intake/Output Summary (Last 24 hours) at 10/04/2020 1716 Last data filed at 10/04/2020 0700 Gross per 24 hour  Intake 12/04/2020 ml  Output 10200 ml  Net 3720 ml    Filed Weights   10/02/20 0821 10/03/20 1045 10/04/20 0500  Weight: 88.2 kg 86.5 kg 87.1 kg   Physical Examination: General: Acutely ill-appearing elderly woman in NAD. Undergoing nebulizer treatment. HEENT: [place]/AT, anicteric sclera, PERRL, moist mucous membranes. Neuro: Awake, oriented x 4. Responds to verbal stimuli. Following commands consistently. Moves all 4 extremities spontaneously. CV: Mildly tachycardic, regular  rhythm, no m/g/r. PULM: Breathing even and minimally labored on HFNC + neb (25LPM, FiO2 100%). Lung fields diminished bilaterally, no wheeze/rhonchi or crackles noted. GI: Soft, nontender, nondistended.  Normoactive bowel sounds. Extremities: Trace BLE edema noted. Skin: Warm/dry, no rashes.  Resolved Hospital Problem List:    Assessment & Plan:  Acute hypoxemic/hypercarbic respiratory failure: Suspect more chronic in nature given well compensated Rester acidosis as well as O2 sat 88% 2019.  In addition, she has polycythemia likely in the setting of chronic hypoxemia.  She states she never been on oxygen at home.  Now worsened with bilateral nodular infiltrates as well as atelectasis.  Suspect shunt physiology not well compensated given emphysema seen in zone 1 lung.  She has been treated with antibiotics as well as steroid taper.  Not much improvement.  May be gradually slow over time.  Some thought of COVID pneumonitis although she has been COVID tested and negative x2 despite close contacts with positive COVID test.  She has equivocal platypnea on exam.  Orthodeoxia not well-established but does seem to have higher sats at times when more supine.  Some concern for cirrhosis which would put her at risk for hepatopulmonary syndrome.  -- O2 sat during 88% --Status post antibiotics -- Status post round of steroids --Diuresis as blood pressure and kidney function allows, notably chloride is dropping now 78 --Stress importance of getting out of bed, incentive spirometer, working with PT OT as O2 sat allows   COPD: Based on PFTs 2019.  Physiologically quite severe with what appears to be chronic hypoxemic respiratory or although not on chronic home O2. 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  No biopsy.  No established diagnosis of portal hypertension.  Would put her at risk of potential hepatopulmonary syndrome although this is not well-established in this patient.)  Best Practice (right click and "Reselect all SmartList Selections" daily)   per primary  Critical care time: N/A   Karren Burly, MD [facility] 10/04/20 5:16 PM  Please see [email or web address] for pager details.  From 7A-7P if no response, please call [phone] After hours, please call ELink [phone]

## 2020-10-05 DIAGNOSIS — I4891 Unspecified atrial fibrillation: Secondary | ICD-10-CM | POA: Diagnosis not present

## 2020-10-05 DIAGNOSIS — J9621 Acute and chronic respiratory failure with hypoxia: Secondary | ICD-10-CM | POA: Diagnosis not present

## 2020-10-05 DIAGNOSIS — J441 Chronic obstructive pulmonary disease with (acute) exacerbation: Secondary | ICD-10-CM | POA: Diagnosis not present

## 2020-10-05 DIAGNOSIS — I5031 Acute diastolic (congestive) heart failure: Secondary | ICD-10-CM | POA: Diagnosis not present

## 2020-10-05 DIAGNOSIS — J9601 Acute respiratory failure with hypoxia: Secondary | ICD-10-CM | POA: Diagnosis not present

## 2020-10-05 LAB — CBC
HCT: 53.1 % — ABNORMAL HIGH (ref 36.0–46.0)
Hemoglobin: 18.3 g/dL — ABNORMAL HIGH (ref 12.0–15.0)
MCH: 31.9 pg (ref 26.0–34.0)
MCHC: 34.5 g/dL (ref 30.0–36.0)
MCV: 92.7 fL (ref 80.0–100.0)
Platelets: 222 10*3/uL (ref 150–400)
RBC: 5.73 MIL/uL — ABNORMAL HIGH (ref 3.87–5.11)
RDW: 13.2 % (ref 11.5–15.5)
WBC: 21.4 10*3/uL — ABNORMAL HIGH (ref 4.0–10.5)
nRBC: 0 % (ref 0.0–0.2)

## 2020-10-05 LAB — COMPREHENSIVE METABOLIC PANEL
ALT: 83 U/L — ABNORMAL HIGH (ref 0–44)
AST: 39 U/L (ref 15–41)
Albumin: 2.7 g/dL — ABNORMAL LOW (ref 3.5–5.0)
Alkaline Phosphatase: 59 U/L (ref 38–126)
Anion gap: 9 (ref 5–15)
BUN: 40 mg/dL — ABNORMAL HIGH (ref 8–23)
CO2: 43 mmol/L — ABNORMAL HIGH (ref 22–32)
Calcium: 8.4 mg/dL — ABNORMAL LOW (ref 8.9–10.3)
Chloride: 80 mmol/L — ABNORMAL LOW (ref 98–111)
Creatinine, Ser: 0.6 mg/dL (ref 0.44–1.00)
GFR, Estimated: >60 mL/min (ref >60)
Glucose, Bld: 117 mg/dL — ABNORMAL HIGH (ref 70–99)
Potassium: 3.5 mmol/L (ref 3.5–5.1)
Sodium: 132 mmol/L — ABNORMAL LOW (ref 135–145)
Total Bilirubin: 2 mg/dL — ABNORMAL HIGH (ref 0.3–1.2)
Total Protein: 5.7 g/dL — ABNORMAL LOW (ref 6.5–8.1)

## 2020-10-05 LAB — GLUCOSE, CAPILLARY
Glucose-Capillary: 130 mg/dL — ABNORMAL HIGH (ref 70–99)
Glucose-Capillary: 158 mg/dL — ABNORMAL HIGH (ref 70–99)
Glucose-Capillary: 152 mg/dL — ABNORMAL HIGH (ref 70–99)
Glucose-Capillary: 172 mg/dL — ABNORMAL HIGH (ref 70–99)

## 2020-10-05 LAB — PHOSPHORUS: Phosphorus: 3.5 mg/dL (ref 2.5–4.6)

## 2020-10-05 LAB — MAGNESIUM: Magnesium: 2.2 mg/dL (ref 1.7–2.4)

## 2020-10-05 NOTE — Progress Notes (Signed)
Patient is on Bipap at this time. HHFNC is on standby. Patient is tolerating well.

## 2020-10-05 NOTE — Progress Notes (Signed)
Performed in and out cath per protocol. This nurse and charge noted a significant amount of purulent/ yellow/ milky discharge from the patients perineal area.   Perineum was cleansed and barrier cream applied to irrigated area.   Will continue to monitor

## 2020-10-05 NOTE — Progress Notes (Signed)
NAME:  Kimberly Ballard, MRN:  [id number], DOB:  1940/09/18, LOS: 13 ADMISSION DATE:  09/22/2020, CONSULTATION DATE:  09/23/2020 REFERRING MD:  Adela Glimpse - TR[facility] CHIEF COMPLAINT:  Dyspnea, cough   History of Present Illness:  80 year old woman presented to [facility] ED 8/22 with dyspnea, body aches, cough after a significant COVID exposure. PMHx significant for AF (on Eliquis), COPD and former tobacco abuse.  Per patient, her daughter was diagnosed with COVID and patient developed symptoms ~8/16.  She discussed concerns with her PCP and paxlovid was prescribed 8/18.  At [facility], she was tested for COVID twice (both negative PCRs) and IgG antibody was also negative. Per PCP notes, patient did receive the primary series of the COVID vaccine as well as 2 subsequent boosters.    Pertinent Medical History:  COPD Atrial fibrillation Former cigarette smoker  Significant Hospital Events: Including procedures, antibiotic start and stop dates in addition to other pertinent events   8/22 - Admit to SDU.  Cough weak.  Pulmonary asked to evaluate given concern for progressive respiratory failure.  COVID-19 and influenza negative. Respiratory viral panel negative.   IV ceftriaxone and azithromycin initiated.  Given IV Solu-Medrol followed by oral prednisone order. 8/23 - Pulmonary asked to evaluate given worsening respiratory failure.  CT chest showing lower lobe consolidation.  There was concern about possible aspiration.  Echo obtained showed good left ventricular function with mild pulmonary artery hypertension. RUQ ultrasound showed hepatic steatosis, possibly cirrhosis based on nodular liver contour, no gallbladder 8/24 - Cardiology consulted for elevated troponins.  This was felt secondary to worsening hypoxia and underlying pulmonary artery hypertension which was felt previously undiagnosed issue.  New onset atrial fibrillation with RVR.  CT angiogram ordered.  Increased oxygen requirement overnight, requiring BiPAP,  later placed on 15 L/min via high flow.  Lower extremity ultrasound negative for DVT 8/25 - Urine strep neg.  Still high FIO2. F/u COVID neg. Transitioned to oral CCB and DOAC. Getting IV lasix. Transitioned to headed High flow [place] during day and BIPAP at night.  8/26 - Still w/ overall net positive fluid balance over hospital stay. CXR w/ worse edema. Lasix increased to 80 mg q 12. Azithromycin stopped (day 5) Steroids reduced.  Cardiology signed off. (See note from cards w/ plan to follow as out-pt)  8/27 - Diuresed, CXR better, eating, got up to chair 8/28 -9/1 Remains on FIO2 80% despite aggressive diuresis. Net neg 11L. 9/02 Ongoing High FiO2 requirements. Net -13.7L/admission (-1.6L/24H). Tolerating HFNC/BiPAP and nebulization treatments. New hematuria for which Urology consulted, started CBI. 9/3 Slight improvement in FiO2  Interim History / Subjective:  FiO2 60% looks like could try to wean  Objective   Blood pressure 101/62, pulse 86, temperature 97.7 F (36.5 C), temperature source Oral, resp. rate (!) 29, height 5\' 3"  (1.6 m), weight 87.1 kg, SpO2 93 %.    FiO2 (%):  [30 %-65 %] 60 %   Intake/Output Summary (Last 24 hours) at 10/05/2020 1100 Last data filed at 10/05/2020 0600 Gross per 24 hour  Intake 4226.37 ml  Output 5900 ml  Net -1673.63 ml    Filed Weights   10/02/20 0821 10/03/20 1045 10/04/20 0500  Weight: 88.2 kg 86.5 kg 87.1 kg   Physical Examination: General: Acutely ill-appearing elderly woman in NAD.  HEENT: [place]/AT, anicteric sclera, PERRL, moist mucous membranes. Neuro: Awake, oriented x 4. Responds to verbal stimuli. Following commands consistently. Moves all 4 extremities spontaneously. CV: regular rate, regular rhythm, no m/g/r. PULM: Breathing  even and minimally labored on HFNC  GI: Soft, nontender, nondistended. Normoactive bowel sounds. Extremities: Trace BLE edema noted. Skin: Warm/dry, no rashes.  Resolved Hospital Problem List:    Assessment &  Plan:  Acute hypoxemic/hypercarbic respiratory failure: Suspect more chronic in nature given well compensated Rester acidosis as well as O2 sat 88% 2019.  In addition, she has polycythemia likely in the setting of chronic hypoxemia.  She states she never been on oxygen at home.  Now worsened with bilateral nodular infiltrates as well as atelectasis.  Suspect shunt physiology not well compensated given emphysema seen in zone 1 lung.  She has been treated with antibiotics as well as steroid taper.  Not much improvement.  May be gradually slow over time.  Some thought of COVID pneumonitis although she has been COVID tested and negative x2 despite close contacts with positive COVID test.  She has equivocal platypnea on exam.  Orthodeoxia not well-established but does seem to have higher sats at times when more supine.  Some concern for cirrhosis which would put her at risk for hepatopulmonary syndrome.  -- O2 sat goal  88% --Status post antibiotics -- Status post round of steroids --Diuresis per cardiology --Stress importance of getting out of bed, incentive spirometer, working with PT OT as O2 sat allows   COPD: Based on PFTs 2019.  Physiologically quite severe with what appears to be chronic hypoxemic respiratory or although not on chronic home O2. 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  No biopsy.  No established diagnosis of portal hypertension.  Would put her at risk of potential hepatopulmonary syndrome although this is not well-established in this patient.)  We will sign off as do not have anything additional to add. If there is to be improvement will likely be slow. Encourage ambulation, OOB, rehab.  Best Practice (right click and "Reselect all SmartList Selections" daily)   per primary  Critical care time: N/A   Karren Burly, MD [facility] 10/05/20 11:00 AM  Please see [email or web address] for  pager details.  From 7A-7P if no response, please call [phone] After hours, please call ELink [phone]

## 2020-10-05 NOTE — Progress Notes (Signed)
Progress Note  Patient Name: Kimberly Ballard Date of Encounter: 10/05/2020  [facility] Cardiologist: Mayford Knife  Subjective   A little stronger. Spent some time in chair yesterday. Did not use BiPAP overnight. Motivated to start moving. AF rate control good over last 24 h, mostly 80-low 90s. Now around 110 while eating. Had some chest discomfort, not very suggestive of angina. Resolved spontaneously. Troponin checked at 67, similar to what appears to be her baseline during this admission (50-120). Has diuresed more. Metabolic alkalosis and hyponatremia noted, but stable. Normal renal function parameters. No weight today.  Inpatient Medications    Scheduled Meds:  arformoterol  15 mcg Nebulization BID   budesonide (PULMICORT) nebulizer solution  0.5 mg Nebulization BID   chlorhexidine  15 mL Mouth Rinse BID   Chlorhexidine Gluconate Cloth  6 each Topical Daily   diltiazem  240 mg Oral Daily   fluconazole  200 mg Oral Daily   furosemide  60 mg Intravenous BID   Gerhardt's butt cream   Topical TID   guaiFENesin  600 mg Oral BID   insulin aspart  0-9 Units Subcutaneous TID WC   mouth rinse  15 mL Mouth Rinse q12n4p   metoprolol tartrate  25 mg Oral BID   polyethylene glycol  17 g Oral BID   revefenacin  175 mcg Nebulization Daily   Continuous Infusions:  sodium chloride Stopped (09/29/20 1824)   sodium chloride irrigation     PRN Meds: sodium chloride, levalbuterol, metoprolol tartrate, oxybutynin, senna-docusate, simethicone, sodium chloride   Vital Signs    Vitals:   10/05/20 0300 10/05/20 0400 10/05/20 0500 10/05/20 0600  BP: 93/63 104/67 96/66 (!) 96/59  Pulse: 61 66 80 69  Resp: 17 15 14 15   Temp: 97.7 F (36.5 C) 97.7 F (36.5 C)    TempSrc: Oral Oral    SpO2: 93% (!) 89% 94% (!) 89%  Weight:      Height:        Intake/Output Summary (Last 24 hours) at 10/05/2020 0815 Last data filed at 10/05/2020 0600 Gross per 24 hour  Intake 4226.37 ml  Output 6650 ml  Net  -2423.63 ml   Last 3 Weights 10/04/2020 10/03/2020 10/02/2020  Weight (lbs) 192 lb 0.3 oz 190 lb 11.2 oz 194 lb 7.1 oz  Weight (kg) 87.1 kg 86.5 kg 88.2 kg      Telemetry    AFib, rate controlled. - Personally Reviewed  ECG    No new tracing - Personally Reviewed  Physical Exam  Weak, frail GEN: No acute distress.   Neck: No JVD Cardiac: RRR, no murmurs, rubs, or gallops.  Respiratory: diminished throughout, but clear to auscultation bilaterally. GI: Soft, nontender, non-distended  MS: No edema; No deformity. Neuro:  Nonfocal  Psych: Normal affect   Labs    High Sensitivity Troponin:   Recent Labs  Lab 09/22/20 2148 09/23/20 0207 09/24/20 0239 10/04/20 1721 10/04/20 1923  TROPONINIHS 128* 100* 52* 59* 67*      Chemistry Recent Labs  Lab 09/29/20 0311 09/30/20 0255 10/01/20 0249 10/03/20 0255 10/04/20 0230 10/05/20 0304  NA 139 137   < > 129* 131* 132*  K 3.9 4.2   < > 3.9 3.6 3.5  CL 85* 84*   < > 79* 76* 80*  CO2 41* 43*   < > 39* 40* 43*  GLUCOSE 147* 165*   < > 152* 123* 117*  BUN 51* 45*   < > 54* 45* 40*  CREATININE  0.85 0.67   < > 0.85 0.69 0.60  CALCIUM 8.6* 8.9   < > 8.5* 8.4* 8.4*  PROT 6.0* 6.1*  --   --   --  5.7*  ALBUMIN 2.7* 2.8*  --   --   --  2.7*  AST 40 39  --   --   --  39  ALT 112* 108*  --   --   --  83*  ALKPHOS 68 65  --   --   --  59  BILITOT 1.0 1.2  --   --   --  2.0*  GFRNONAA >60 >60   < > >60 >60 >60  ANIONGAP 13 10   < > 11 15 9    < > = values in this interval not displayed.     Hematology Recent Labs  Lab 10/01/20 0249 10/03/20 1532 10/04/20 0230 10/05/20 0304  WBC 20.2*  --  25.1* 21.4*  RBC 5.90*  --  5.50* 5.73*  HGB 18.5* 17.9* 17.5* 18.3*  HCT 56.0* 51.9* 50.6* 53.1*  MCV 94.9  --  92.0 92.7  MCH 31.4  --  31.8 31.9  MCHC 33.0  --  34.6 34.5  RDW 13.2  --  12.8 13.2  PLT 270  --  247 222    BNP Recent Labs  Lab 10/03/20 0255  BNP 430.5*     DDimer No results for input(s): DDIMER in the last 168  hours.   Radiology    DG Chest 1 View  Result Date: 10/04/2020 CLINICAL DATA:  Left side chest pain EXAM: CHEST  1 VIEW COMPARISON:  09/29/2020 FINDINGS: Heart is normal size. Bibasilar atelectasis or infiltrates, slightly improved since prior study. Suspect small left effusion. Aortic atherosclerosis. No acute bony abnormality. IMPRESSION: Bilateral lower atelectasis or infiltrates, slightly improved since prior study. Small left effusion. Electronically Signed   By: 10/01/2020 M.D.   On: 10/04/2020 19:45    Cardiac Studies   Complete Echo 09/23/2020: Impressions:  1. Left ventricular ejection fraction, by estimation, is 55 to 60%. Left  ventricular ejection fraction by 3D volume is 59 %. The left ventricle has normal function. The left ventricle has no regional wall motion  abnormalities. There is moderate left ventricular hypertrophy. Left ventricular diastolic parameters are consistent with Grade I diastolic dysfunction (impaired relaxation).   2. Right ventricular systolic function mildly reduced at base and mid,  with relative preserved apical systolic function. The right ventricular  size is moderately enlarged. There is moderately elevated pulmonary artery systolic pressure. The estimated right ventricular systolic pressure is 48.4 mmHg.   3. Left atrial size was mildly dilated.   4. Right atrial size was moderately dilated.   5. The mitral valve is normal in structure. Trivial mitral valve  regurgitation. No evidence of mitral stenosis.   6. Tricuspid valve regurgitation is mild to moderate.   7. The aortic valve is normal in structure. Aortic valve regurgitation is not visualized. No aortic stenosis is present.   8. The inferior vena cava is dilated in size with <50% respiratory  variability, suggesting right atrial pressure of 15 mmHg.    Follow-up limited study 10/01/2020 Limited study, no major change from  recent study 09/23/20. Bubble study negative for intraatrial shunt     Patient Profile     80 y.o. female with a history of COPD (60-pack-year, quit smoking 2019) admitted on 09/22/2020 for acute hypoxic respiratory failure secondary to COPD exacerbation and multifocal pneumonia (received Paxlovid  08/18 after daughter had COVID; presented with shortness of breath, body aches, cough, and decreased appetite, multiple negative COVID tests). Later developed atrial fibrillation with RVR treated with anticoagulation and rate control (reconsulted 08/31 for recurrent RVR).  Assessment & Plan    AFib w RVR: NSR on admission. On anticoagulation. CHADSVasc 5 (age 35. Gender, CHF, +/-CAD) . Rate control is now in desired range. Do not anticipate attempts to restore NSR until there is substantial respiratory improvement. Will discuss as outpatient, after rehab. Acute diastolic HF: "moderate" LVH on echo (mild by measurements), but no AS or history of HTN. Consider amyloidosis, but diastolic parameters (when she was in NSR) were not that bad. BNP elevated 964, substantially improved 430 yesterday (baseline unknown). Dry weight presumed 190 lb (not sure; note weight 210 last November in office). 192 lb yesterday. Would recommend keeping "even" in/out balance. Coronary atherosclerosis: calcifications on CT, minor troponin leak c/w demand ischemia. No regional wall motion abn. Nonspecific ECG repol changes. No angina. Reevaluate as OP. COPD exacerbation: preceded by viral sd. Major cause of decompensation. Cor pulmonale: dilated RV/reduced RV function, dilated RA c/w chronic cor pulmonale on echo at presentation. 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 No PE on CTA 8/22 and 8/24.     For questions or updates, please contact [facility] Please consult [email or web address] for contact info under        Signed, Thurmon Fair, MD  10/05/2020, 8:15 AM

## 2020-10-05 NOTE — Progress Notes (Signed)
  Subjective: No acute events overnight.  Urine clear and CBI is off. Patient resting comfortably.  Objective: Vital signs in last 24 hours: Temp:  [97.7 F (36.5 C)-98.2 F (36.8 C)] 97.7 F (36.5 C) (09/04 0820) Pulse Rate:  [49-114] 103 (09/04 1135) Resp:  [14-36] 18 (09/04 1135) BP: (87-124)/(54-74) 109/73 (09/04 1135) SpO2:  [85 %-96 %] 92 % (09/04 1135) FiO2 (%):  [30 %-65 %] 55 % (09/04 1135)  Intake/Output from previous day: 09/03 0701 - 09/04 0700 In: 4226.4 [IV Piggyback:226.4] Out: 7650 [Urine:7650]  Intake/Output this shift: No intake/output data recorded.  Physical Exam:  GU: Foley catheter in place and draining clear to light pink with slow CBI drip  Lab Results: Recent Labs    10/03/20 1532 10/04/20 0230 10/05/20 0304  HGB 17.9* 17.5* 18.3*  HCT 51.9* 50.6* 53.1*   BMET Recent Labs    10/04/20 0230 10/05/20 0304  NA 131* 132*  K 3.6 3.5  CL 76* 80*  CO2 40* 43*  GLUCOSE 123* 117*  BUN 45* 40*  CREATININE 0.69 0.60  CALCIUM 8.4* 8.4*     Studies/Results: DG Chest 1 View  Result Date: 10/04/2020 CLINICAL DATA:  Left side chest pain EXAM: CHEST  1 VIEW COMPARISON:  09/29/2020 FINDINGS: Heart is normal size. Bibasilar atelectasis or infiltrates, slightly improved since prior study. Suspect small left effusion. Aortic atherosclerosis. No acute bony abnormality. IMPRESSION: Bilateral lower atelectasis or infiltrates, slightly improved since prior study. Small left effusion. Electronically Signed   By: Charlett Nose M.D.   On: 10/04/2020 19:45    Assessment/Plan: 80 y.o. female s/p Hx of COPD admitted for multifocal pneumonia in the setting of a significant COVID exposure (notably COVID negative on testing) as well as new onset Afib w/ RVR now on Eliquis w/ new onset hematuria.  -Discontinue CBI. The patient can have a voiding trial when it is deemed appropriate by the primary team.   LOS: 13 days   Wilkie Aye

## 2020-10-05 NOTE — Progress Notes (Signed)
Upon assessment patient was dyspneic, respirations in the 40's, using accessory muscles.   I educated the patient on the importance of wearing her Bi-pap, she agreed.   Respiratory was called and placed patient on bi-pap.

## 2020-10-05 NOTE — Progress Notes (Signed)
PROGRESS NOTE    Kimberly Ballard  WUJ:[id number] DOB: 19-Jul-1940 DOA: 09/22/2020 PCP: System, Provider Not In   Brief Narrative:  This 80 years old female with PMH significant for COPD presented in the ED with worsening shortness of breath, decreased appetite and coughing.  Patient's daughter was diagnosed with COVID last week . she was started on paxloid empirically due to the risk of exposure but patient continued to get worse and developed shortness of breath.  In the ED she was requiring 6 L of oxygen. CT chest ruled out PE but shows evidence of bilateral infiltrates.  Patient was started on Rocephin and Zithromax for community-acquired pneumonia.  Subsequently she decompensated and worsened and went into A. fib with RVR and was placed on BiPAP and admitted in the stepdown.  Cardiology and pulmonology was consulted.  Patient was started on Cardizem drip and heparin gtt,  now heart rate is controlled,  transitioned to p.o. Cardizem.  She is weaned down to 15 L nonrebreather.  She was started on heparin and now transition to Eliquis.  Cardiology recommended continue Lasix for adequate diuresis. Respiratory status is slowly improving and after goals of care discussion with pulmonology she was made DNR.  Patient has developed hematuria , urology was consulted recommended to CBI.  Urine is getting clear, urine cultures grew Candida , started on Diflucan.  Assessment & Plan:   Active Problems:   CAP (community acquired pneumonia)   Acute respiratory failure with hypoxia and hypercapnia (HCC)   Sepsis (HCC)   Elevated LFTs   Cardiomegaly   Elevated troponin   Atrial fibrillation with RVR (HCC)   Acute and chronic respiratory failure (acute-on-chronic) (HCC)   Dyspnea   Acute Hypoxic / Hypercapnic respiratory failure requiring noninvasive positive pressure ventilation with BiPAP: This could be multifactorial from underlying multifocal pneumonia and acute diastolic CHF. CTA chest ruled out PE,  showed patchy consolidative opacities consistent with multifocal pneumonia. Completed antibiotics for 5 days for CAP (ceftriaxone and Zithromax) Blood cultures no growth so far. Continued Lasix 60 mg twice daily as per cardio. Continue supplemental oxygen, remains on high flow oxygen 30L/min. Continue BiPAP at night and as needed. Pulmonology feels that this is clinically COVID given that hypoxemia and hypercapnia however now her isolation precautions have been discontinued. Covid negative x 2 Continue nebulization with Brovana/Pulmicort/Yupelri. Continue Xopenex every 4 hours for wheezing as needed.  Solu-Medrol discontinued. Pulmonology recommend maintaining in ICU, mobilizing getting up and out of bed.  Atrial fibrillation with RVR: Heart rate now controlled, in desirable range. Continue Cardizem and metoprolol. Keep Eliquis on hold until hematuria resolves.  Acute diastolic CHF: Echo: LVEF 55 to 60%.  Continued to have bilateral crackles on exam. Cardiology recommended continuing diuresis with Lasix 60 mg twice daily. Strict intake / output charting, daily weight. Weight down 5 Lbs since admission.  Target weight 190 pounds  AKI: > Resolved. Presented with serum creatinine 1.28. Resolved.  Elevated troponins: Likely demand ischemia.   EKG no significant T wave changes. Cardiology: Reevaluate as an outpatient.  Community-acquired pneumonia: Completed antibiotics for 5 days, No COPD exacerbation. Steroids discontinued.  Elevated LFTs: > Improving.  Right middle lobe nodule: Needs repeat CT chest in 3 months.  Hematuria: Urology consulted, started on CBI Hold Eliquis until hematuria resolves. Continue CBI , urine is getting clear.  New yeast infection: Continue Diflucan for 14 days.  DVT prophylaxis: SCDs Code Status: DNR Family Communication: No family at bed side. Disposition Plan:   Status is:  Inpatient  Remains inpatient appropriate because:Inpatient level  of care appropriate due to severity of illness  Dispo: The patient is from: Home              Anticipated d/c is to: SNF              Patient currently is not medically stable to d/c.   Difficult to place patient No  Consultants:  Pulmonology and cardiology Urology  Procedures:  Antimicrobials:   Anti-infectives (From admission, onward)    Start     Dose/Rate Route Frequency Ordered Stop   10/03/20 1700  fluconazole (DIFLUCAN) tablet 200 mg        200 mg Oral Daily 10/03/20 1606 10/17/20 0959   09/23/20 1800  cefTRIAXone (ROCEPHIN) 2 g in sodium chloride 0.9 % 100 mL IVPB        2 g 200 mL/hr over 30 Minutes Intravenous Every 24 hours 09/22/20 1842 09/29/20 1749   09/23/20 1800  azithromycin (ZITHROMAX) 500 mg in sodium chloride 0.9 % 250 mL IVPB  Status:  Discontinued        500 mg 250 mL/hr over 60 Minutes Intravenous Every 24 hours 09/22/20 1842 09/25/20 1456   09/22/20 1630  cefTRIAXone (ROCEPHIN) 1 g in sodium chloride 0.9 % 100 mL IVPB        1 g 200 mL/hr over 30 Minutes Intravenous  Once 09/22/20 1618 09/22/20 1806   09/22/20 1630  azithromycin (ZITHROMAX) 500 mg in sodium chloride 0.9 % 250 mL IVPB        500 mg 250 mL/hr over 60 Minutes Intravenous  Once 09/22/20 1618 09/22/20 1939        Subjective: Patient was seen and examined at bedside.  Overnight events noted. Patient could not tolerate BiPAP last night. She reports feeling better on HFNC Urine is getting clear, she remains on CBI.  Heart rate is now controlled.  Objective: Vitals:   10/05/20 0820 10/05/20 0840 10/05/20 0900 10/05/20 1000  BP:   (!) 94/58   Pulse:  98 72 86  Resp:  (!) 30 (!) 35 (!) 29  Temp: 97.7 F (36.5 C)     TempSrc: Oral     SpO2:  91% 90% 93%  Weight:      Height:        Intake/Output Summary (Last 24 hours) at 10/05/2020 1019 Last data filed at 10/05/2020 0600 Gross per 24 hour  Intake 4226.37 ml  Output 5900 ml  Net -1673.63 ml   Filed Weights   10/02/20 0821 10/03/20  1045 10/04/20 0500  Weight: 88.2 kg 86.5 kg 87.1 kg    Examination:  General exam: Appears chronically ill, deconditioned, not in any acute distress.  Still remains on HFNC Respiratory system: Clear to auscultation bilaterally, respiratory effort normal RR 16 Cardiovascular system: S1-S2 heard, Irregular rate and rhythm, no murmur. Gastrointestinal system: Abdomen is soft, nontender, nondistended, BS + Central nervous system: Alert and oriented x 2. No focal neurological deficits. Extremities: No edema, no cyanosis, no clubbing. Skin: No rashes, lesions or ulcers Psychiatry: Mood and affect appropriate.    Data Reviewed: I have personally reviewed following labs and imaging studies  CBC: Recent Labs  Lab 09/29/20 0311 09/30/20 0255 10/01/20 0249 10/03/20 1532 10/04/20 0230 10/05/20 0304  WBC 17.6* 17.6* 20.2*  --  25.1* 21.4*  NEUTROABS 15.7* 15.9*  --   --   --   --   HGB 17.9* 18.1* 18.5* 17.9* 17.5* 18.3*  HCT 53.6*  55.4* 56.0* 51.9* 50.6* 53.1*  MCV 95.4 95.7 94.9  --  92.0 92.7  PLT 324 298 270  --  247 222   Basic Metabolic Panel: Recent Labs  Lab 09/29/20 0311 09/30/20 0255 10/01/20 0249 10/02/20 0310 10/03/20 0255 10/04/20 0230 10/05/20 0304  NA 139 137 132* 135 129* 131* 132*  K 3.9 4.2 4.1 4.2 3.9 3.6 3.5  CL 85* 84* 80* 81* 79* 76* 80*  CO2 41* 43* 40* 42* 39* 40* 43*  GLUCOSE 147* 165* 172* 137* 152* 123* 117*  BUN 51* 45* 47* 41* 54* 45* 40*  CREATININE 0.85 0.67 0.75 0.70 0.85 0.69 0.60  CALCIUM 8.6* 8.9 9.0 9.0 8.5* 8.4* 8.4*  MG 2.3 2.3  --   --   --   --  2.2  PHOS 4.3 3.9  --   --   --   --  3.5   GFR: Estimated Creatinine Clearance: 59.7 mL/min (by C-G formula based on SCr of 0.6 mg/dL). Liver Function Tests: Recent Labs  Lab 09/29/20 0311 09/30/20 0255 10/05/20 0304  AST 40 39 39  ALT 112* 108* 83*  ALKPHOS 68 65 59  BILITOT 1.0 1.2 2.0*  PROT 6.0* 6.1* 5.7*  ALBUMIN 2.7* 2.8* 2.7*   Recent Labs  Lab 09/30/20 0250  LIPASE  54*   No results for input(s): AMMONIA in the last 168 hours. Coagulation Profile: No results for input(s): INR, PROTIME in the last 168 hours. Cardiac Enzymes: No results for input(s): CKTOTAL, CKMB, CKMBINDEX, TROPONINI in the last 168 hours. BNP (last 3 results) No results for input(s): PROBNP in the last 8760 hours. HbA1C: Recent Labs    10/03/20 1226  HGBA1C 6.6*   CBG: Recent Labs  Lab 10/04/20 0744 10/04/20 1150 10/04/20 1614 10/04/20 2124 10/05/20 0753  GLUCAP 132* 139* 170* 141* 130*   Lipid Profile: No results for input(s): CHOL, HDL, LDLCALC, TRIG, CHOLHDL, LDLDIRECT in the last 72 hours. Thyroid Function Tests: No results for input(s): TSH, T4TOTAL, FREET4, T3FREE, THYROIDAB in the last 72 hours. Anemia Panel: No results for input(s): VITAMINB12, FOLATE, FERRITIN, TIBC, IRON, RETICCTPCT in the last 72 hours. Sepsis Labs: No results for input(s): PROCALCITON, LATICACIDVEN in the last 168 hours.  Recent Results (from the past 240 hour(s))  Urine Culture     Status: Abnormal   Collection Time: 10/01/20 10:13 AM   Specimen: Urine, Catheterized  Result Value Ref Range Status   Specimen Description   Final    URINE, CATHETERIZED Performed at [facility], [address] [place], [place] [address]    Special Requests   Final    NONE Performed at [facility], [address], [place], [place] [address]    Culture 40,000 COLONIES/mL YEAST (A)  Final   Report Status 10/02/2020 FINAL  Final    Radiology Studies: DG Chest 1 View  Result Date: 10/04/2020 CLINICAL DATA:  Left side chest pain EXAM: CHEST  1 VIEW COMPARISON:  09/29/2020 FINDINGS: Heart is normal size. Bibasilar atelectasis or infiltrates, slightly improved since prior study. Suspect small left effusion. Aortic atherosclerosis. No acute bony abnormality. IMPRESSION: Bilateral lower atelectasis or infiltrates, slightly improved since prior study. Small left  effusion. Electronically Signed   By: Charlett Nose M.D.   On: 10/04/2020 19:45    Scheduled Meds:  arformoterol  15 mcg Nebulization BID   budesonide (PULMICORT) nebulizer solution  0.5 mg Nebulization BID   chlorhexidine  15 mL Mouth Rinse BID   Chlorhexidine  Gluconate Cloth  6 each Topical Daily   diltiazem  240 mg Oral Daily   fluconazole  200 mg Oral Daily   furosemide  60 mg Intravenous BID   Gerhardt's butt cream   Topical TID   guaiFENesin  600 mg Oral BID   insulin aspart  0-9 Units Subcutaneous TID WC   mouth rinse  15 mL Mouth Rinse q12n4p   metoprolol tartrate  25 mg Oral BID   polyethylene glycol  17 g Oral BID   revefenacin  175 mcg Nebulization Daily   Continuous Infusions:  sodium chloride Stopped (09/29/20 1824)   sodium chloride irrigation       LOS: 13 days    Time spent: 35 mins.    Cipriano Bunker, MD [facility]   If 7PM-7AM, please contact night-coverage

## 2020-10-06 DIAGNOSIS — J441 Chronic obstructive pulmonary disease with (acute) exacerbation: Secondary | ICD-10-CM | POA: Diagnosis not present

## 2020-10-06 DIAGNOSIS — I4891 Unspecified atrial fibrillation: Secondary | ICD-10-CM | POA: Diagnosis not present

## 2020-10-06 DIAGNOSIS — J9601 Acute respiratory failure with hypoxia: Secondary | ICD-10-CM | POA: Diagnosis not present

## 2020-10-06 DIAGNOSIS — J189 Pneumonia, unspecified organism: Secondary | ICD-10-CM | POA: Diagnosis not present

## 2020-10-06 DIAGNOSIS — I5031 Acute diastolic (congestive) heart failure: Secondary | ICD-10-CM | POA: Diagnosis not present

## 2020-10-06 DIAGNOSIS — R0602 Shortness of breath: Secondary | ICD-10-CM

## 2020-10-06 DIAGNOSIS — R778 Other specified abnormalities of plasma proteins: Secondary | ICD-10-CM | POA: Diagnosis not present

## 2020-10-06 LAB — CBC
HCT: 48.3 % — ABNORMAL HIGH (ref 36.0–46.0)
Hemoglobin: 16.7 g/dL — ABNORMAL HIGH (ref 12.0–15.0)
MCH: 31.6 pg (ref 26.0–34.0)
MCHC: 34.6 g/dL (ref 30.0–36.0)
MCV: 91.5 fL (ref 80.0–100.0)
Platelets: 209 10*3/uL (ref 150–400)
RBC: 5.28 MIL/uL — ABNORMAL HIGH (ref 3.87–5.11)
RDW: 13.1 % (ref 11.5–15.5)
WBC: 18.1 10*3/uL — ABNORMAL HIGH (ref 4.0–10.5)
nRBC: 0 % (ref 0.0–0.2)

## 2020-10-06 LAB — GLUCOSE, CAPILLARY
Glucose-Capillary: 134 mg/dL — ABNORMAL HIGH (ref 70–99)
Glucose-Capillary: 136 mg/dL — ABNORMAL HIGH (ref 70–99)
Glucose-Capillary: 218 mg/dL — ABNORMAL HIGH (ref 70–99)
Glucose-Capillary: 167 mg/dL — ABNORMAL HIGH (ref 70–99)

## 2020-10-06 LAB — BASIC METABOLIC PANEL
Anion gap: 12 (ref 5–15)
BUN: 36 mg/dL — ABNORMAL HIGH (ref 8–23)
CO2: 41 mmol/L — ABNORMAL HIGH (ref 22–32)
Calcium: 8.4 mg/dL — ABNORMAL LOW (ref 8.9–10.3)
Chloride: 79 mmol/L — ABNORMAL LOW (ref 98–111)
Creatinine, Ser: 0.66 mg/dL (ref 0.44–1.00)
GFR, Estimated: >60 mL/min (ref >60)
Glucose, Bld: 115 mg/dL — ABNORMAL HIGH (ref 70–99)
Potassium: 3.4 mmol/L — ABNORMAL LOW (ref 3.5–5.1)
Sodium: 132 mmol/L — ABNORMAL LOW (ref 135–145)

## 2020-10-06 MED ORDER — POTASSIUM CHLORIDE 20 MEQ PO PACK
40.0000 meq | PACK | Freq: Once | ORAL | Status: AC
  Administered 2020-10-06: 40 meq via ORAL
  Filled 2020-10-06: qty 2

## 2020-10-06 NOTE — Progress Notes (Signed)
Progress Note  Patient Name: Kimberly Ballard Date of Encounter: 10/06/2020  [facility] Cardiologist: Mayford Knife  Subjective   Little progress.  Used BiPAP for severe dyspnea last evening.  Better this morning. Oxygen saturation drops while eating and her heart rate increases to the 110-120 range, otherwise A. fib is well rate controlled in the 80s-90s. Additional 2.3 L diuresis.  Net -16 L this admission.  Despite this, weight reportedly up 5 pounds in the last couple of days. Renal parameters still okay.  Hypokalemia.  Inpatient Medications    Scheduled Meds:  arformoterol  15 mcg Nebulization BID   budesonide (PULMICORT) nebulizer solution  0.5 mg Nebulization BID   chlorhexidine  15 mL Mouth Rinse BID   Chlorhexidine Gluconate Cloth  6 each Topical Daily   diltiazem  240 mg Oral Daily   fluconazole  200 mg Oral Daily   furosemide  60 mg Intravenous BID   Gerhardt's butt cream   Topical TID   guaiFENesin  600 mg Oral BID   insulin aspart  0-9 Units Subcutaneous TID WC   mouth rinse  15 mL Mouth Rinse q12n4p   metoprolol tartrate  25 mg Oral BID   polyethylene glycol  17 g Oral BID   revefenacin  175 mcg Nebulization Daily   Continuous Infusions:  sodium chloride Stopped (09/29/20 1824)   PRN Meds: sodium chloride, levalbuterol, metoprolol tartrate, oxybutynin, senna-docusate, simethicone, sodium chloride   Vital Signs    Vitals:   10/06/20 0404 10/06/20 0500 10/06/20 0800 10/06/20 0809  BP:  (!) 91/47    Pulse:  77    Resp:  19    Temp: 99.4 F (37.4 C)   98.1 F (36.7 C)  TempSrc: Oral   Axillary  SpO2:  93% 95%   Weight:  89.5 kg    Height:        Intake/Output Summary (Last 24 hours) at 10/06/2020 0931 Last data filed at 10/06/2020 0000 Gross per 24 hour  Intake 200 ml  Output 2150 ml  Net -1950 ml   Last 3 Weights 10/06/2020 10/04/2020 10/03/2020  Weight (lbs) 197 lb 5 oz 192 lb 0.3 oz 190 lb 11.2 oz  Weight (kg) 89.5 kg 87.1 kg 86.5 kg      Telemetry     Atrial fibrillation with occasional RVR- Personally Reviewed  ECG    No new tracing- Personally Reviewed  Physical Exam  Appears weak GEN: No acute distress.   Neck: No JVD Cardiac: Irregular, no murmurs, rubs, or gallops.  Respiratory: Diminished breath sounds throughout, no wheezing GI: Soft, nontender, non-distended  MS: No edema; No deformity. Neuro:  Nonfocal  Psych: Normal affect   Labs    High Sensitivity Troponin:   Recent Labs  Lab 09/22/20 2148 09/23/20 0207 09/24/20 0239 10/04/20 1721 10/04/20 1923  TROPONINIHS 128* 100* 52* 59* 67*      Chemistry Recent Labs  Lab 09/30/20 0255 10/01/20 0249 10/04/20 0230 10/05/20 0304 10/06/20 0251  NA 137   < > 131* 132* 132*  K 4.2   < > 3.6 3.5 3.4*  CL 84*   < > 76* 80* 79*  CO2 43*   < > 40* 43* 41*  GLUCOSE 165*   < > 123* 117* 115*  BUN 45*   < > 45* 40* 36*  CREATININE 0.67   < > 0.69 0.60 0.66  CALCIUM 8.9   < > 8.4* 8.4* 8.4*  PROT 6.1*  --   --  5.7*  --   ALBUMIN 2.8*  --   --  2.7*  --   AST 39  --   --  39  --   ALT 108*  --   --  83*  --   ALKPHOS 65  --   --  59  --   BILITOT 1.2  --   --  2.0*  --   GFRNONAA >60   < > >60 >60 >60  ANIONGAP 10   < > 15 9 12    < > = values in this interval not displayed.     Hematology Recent Labs  Lab 10/04/20 0230 10/05/20 0304 10/06/20 0251  WBC 25.1* 21.4* 18.1*  RBC 5.50* 5.73* 5.28*  HGB 17.5* 18.3* 16.7*  HCT 50.6* 53.1* 48.3*  MCV 92.0 92.7 91.5  MCH 31.8 31.9 31.6  MCHC 34.6 34.5 34.6  RDW 12.8 13.2 13.1  PLT 247 222 209    BNP Recent Labs  Lab 10/03/20 0255  BNP 430.5*     DDimer No results for input(s): DDIMER in the last 168 hours.   Radiology    DG Chest 1 View  Result Date: 10/04/2020 CLINICAL DATA:  Left side chest pain EXAM: CHEST  1 VIEW COMPARISON:  09/29/2020 FINDINGS: Heart is normal size. Bibasilar atelectasis or infiltrates, slightly improved since prior study. Suspect small left effusion. Aortic atherosclerosis.  No acute bony abnormality. IMPRESSION: Bilateral lower atelectasis or infiltrates, slightly improved since prior study. Small left effusion. Electronically Signed   By: 10/01/2020 M.D.   On: 10/04/2020 19:45    Cardiac Studies   Complete Echo 09/23/2020: Impressions:  1. Left ventricular ejection fraction, by estimation, is 55 to 60%. Left  ventricular ejection fraction by 3D volume is 59 %. The left ventricle has normal function. The left ventricle has no regional wall motion  abnormalities. There is moderate left ventricular hypertrophy. Left ventricular diastolic parameters are consistent with Grade I diastolic dysfunction (impaired relaxation).   2. Right ventricular systolic function mildly reduced at base and mid,  with relative preserved apical systolic function. The right ventricular  size is moderately enlarged. There is moderately elevated pulmonary artery systolic pressure. The estimated right ventricular systolic pressure is 48.4 mmHg.   3. Left atrial size was mildly dilated.   4. Right atrial size was moderately dilated.   5. The mitral valve is normal in structure. Trivial mitral valve  regurgitation. No evidence of mitral stenosis.   6. Tricuspid valve regurgitation is mild to moderate.   7. The aortic valve is normal in structure. Aortic valve regurgitation is not visualized. No aortic stenosis is present.   8. The inferior vena cava is dilated in size with <50% respiratory  variability, suggesting right atrial pressure of 15 mmHg.    Follow-up limited study 10/01/2020 Limited study, no major change from  recent study 09/23/20. Bubble study negative for intraatrial shunt  Patient Profile     80 y.o. female with a history of COPD (60-pack-year, quit smoking 2019) admitted on 09/22/2020 for acute hypoxic respiratory failure secondary to COPD exacerbation and multifocal pneumonia (received Paxlovid 08/18 after daughter had COVID; presented with shortness of breath, body aches,  cough, and decreased appetite, multiple negative COVID tests). Later developed atrial fibrillation with RVR treated with anticoagulation and rate control (reconsulted 08/31 for recurrent RVR).  Assessment & Plan    AFib w RVR: NSR on admission. On anticoagulation. CHADSVasc 5 (age 42. Gender, CHF, +/-CAD) . Rate control is generally  good, but she becomes tachycardic when eating and oxygen saturation decreases. Do not anticipate attempts to restore NSR, until there is substantial respiratory improvement. Will discuss as outpatient, after rehab. Acute diastolic HF: "moderate" LVH on echo (mild by measurements), but no AS or history of HTN. Consider amyloidosis, but diastolic parameters (when she was in NSR) were not that bad. BNP elevated 964, substantially improved 430 yesterday (baseline unknown). Dry weight presumed 190 lb (not sure; note weight 210 last November in office).  Weight was 192 pounds on 09/03 and is 197 pounds today, despite reported net diuresis of 5.7 L in the last 2 days (suspect errors in recording, due to bladder irrigation protocol).  Would recommend keeping "even" in/out balance. Coronary atherosclerosis: calcifications on CT, minor troponin leak c/w demand ischemia. No regional wall motion abn. Nonspecific ECG repol changes. No angina. Reevaluate as OP. COPD exacerbation with acute on chronic combined hypoxic/hypercapnic respiratory failure: preceded by viral sd. Major cause of decompensation. Cor pulmonale: dilated RV/reduced RV function, dilated RA c/w chronic cor pulmonale on echo at presentation. 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 No PE on CTA 8/22 and 8/24.     For questions or updates, please contact [facility] Please consult [email or web address] for contact info under        Signed, Thurmon Fair, MD  10/06/2020, 9:31 AM

## 2020-10-06 NOTE — Progress Notes (Signed)
[facility]  PROGRESS NOTE  Kimberly Ballard HKV:[id number] DOB: 1940-03-19 DOA: 09/22/2020 PCP: System, Provider Not In   Brief HPI:   80 year old female with a history of COPD presented with shortness of breath.  She was found to be hypoxemic requiring 6 L of oxygen.  CTA chest ruled out pulmonary embolism but showed bilateral infiltrates.  Patient started on Rocephin and Zithromax for CAP.  Also went into A. fib with RVR required BiPAP.  Cardiology and pulmonology were consulted.  She was started on Cardizem drip and heparin GTT.  She has been transitioned to p.o. Cardizem.  Diuresed well with IV Lasix.  Patient is DNR.  She also developed hematuria.  Started on CBI per urology.  Urine culture grew Candida, started on Diflucan.    Subjective   Patient seen and examined, says that breathing has not significantly improved.  Still requiring 20 L/min HFNC.   Assessment/Plan:     Acute hypoxemic/hypercapnic respiratory failure requiring BiPAP -Multifactorial from multifocal pneumonia, diastolic CHF -CTA chest ruled out pulmonary embolism, showed multifocal pneumonia -Completed antibiotics ,ceftriaxone Zithromax for 5 days -Started on Lasix 60 mg IV twice daily per cardiology -Continues on HFNC 20 L/min -BiPAP as needed -There was concern for COVID-pneumonia however patient was COVID-negative x2 -Continue Brovana/Pulmicort/Yupelri -Continue as needed Xopenex  Atrial fibrillation with RVR -Heart rate is controlled -Continue Cardizem, metoprolol -Eliquis on hold due to hematuria  Acute diastolic CHF -As per echo EF 55 to 60% -Cardiology recommended continue diuresis with IV Lasix twice daily  Acute kidney injury -Resolved  Elevated troponin -Likely demand ischemia -Cardiology following, evaluation as outpatient  Right middle lobe nodule -Repeat CT chest in 3 months  Hematuria -Urology consulted, started on CBI -Hold Eliquis until hematuria resolves  Yeast in  urine -Started on Diflucan for 14 days  Hypokalemia -Potassium was 3.4 -Replace potassium and follow BMP in am   Scheduled medications:    arformoterol  15 mcg Nebulization BID   budesonide (PULMICORT) nebulizer solution  0.5 mg Nebulization BID   chlorhexidine  15 mL Mouth Rinse BID   Chlorhexidine Gluconate Cloth  6 each Topical Daily   diltiazem  240 mg Oral Daily   fluconazole  200 mg Oral Daily   furosemide  60 mg Intravenous BID   Gerhardt's butt cream   Topical TID   guaiFENesin  600 mg Oral BID   insulin aspart  0-9 Units Subcutaneous TID WC   mouth rinse  15 mL Mouth Rinse q12n4p   metoprolol tartrate  25 mg Oral BID   polyethylene glycol  17 g Oral BID   revefenacin  175 mcg Nebulization Daily         Data Reviewed:   CBG:  Recent Labs  Lab 10/05/20 1201 10/05/20 1642 10/05/20 2222 10/06/20 0749 10/06/20 1110  GLUCAP 158* 152* 172* 134* 136*    SpO2: 91 % O2 Flow Rate (L/min): 20 L/min FiO2 (%): 50 %    Vitals:   10/06/20 1000 10/06/20 1100 10/06/20 1122 10/06/20 1409  BP: (!) 96/57 110/63    Pulse: 85 (!) 115    Resp: (!) 29 (!) 28    Temp:   97.7 F (36.5 C)   TempSrc:   Oral   SpO2: 91% 94%  91%  Weight:      Height:         Intake/Output Summary (Last 24 hours) at 10/06/2020 1452 Last data filed at 10/06/2020 1000 Gross per 24 hour  Intake 200 ml  Output 1575 ml  Net -1375 ml    09/03 1901 - 09/05 0700 In: 200 [P.O.:200] Out: 5450 [Urine:5450]  Filed Weights   10/03/20 1045 10/04/20 0500 10/06/20 0500  Weight: 86.5 kg 87.1 kg 89.5 kg    CBC:  Recent Labs  Lab 09/30/20 0255 10/01/20 0249 10/03/20 1532 10/04/20 0230 10/05/20 0304 10/06/20 0251  WBC 17.6* 20.2*  --  25.1* 21.4* 18.1*  HGB 18.1* 18.5* 17.9* 17.5* 18.3* 16.7*  HCT 55.4* 56.0* 51.9* 50.6* 53.1* 48.3*  PLT 298 270  --  247 222 209  MCV 95.7 94.9  --  92.0 92.7 91.5  MCH 31.3 31.4  --  31.8 31.9 31.6  MCHC 32.7 33.0  --  34.6 34.5 34.6  RDW 13.4 13.2   --  12.8 13.2 13.1  LYMPHSABS 0.7  --   --   --   --   --   MONOABS 0.7  --   --   --   --   --   EOSABS 0.0  --   --   --   --   --   BASOSABS 0.1  --   --   --   --   --     Complete metabolic panel:  Recent Labs  Lab 09/30/20 0255 10/01/20 0249 10/02/20 0310 10/03/20 0255 10/03/20 1226 10/04/20 0230 10/05/20 0304 10/06/20 0251  NA 137   < > 135 129*  --  131* 132* 132*  K 4.2   < > 4.2 3.9  --  3.6 3.5 3.4*  CL 84*   < > 81* 79*  --  76* 80* 79*  CO2 43*   < > 42* 39*  --  40* 43* 41*  GLUCOSE 165*   < > 137* 152*  --  123* 117* 115*  BUN 45*   < > 41* 54*  --  45* 40* 36*  CREATININE 0.67   < > 0.70 0.85  --  0.69 0.60 0.66  CALCIUM 8.9   < > 9.0 8.5*  --  8.4* 8.4* 8.4*  AST 39  --   --   --   --   --  39  --   ALT 108*  --   --   --   --   --  83*  --   ALKPHOS 65  --   --   --   --   --  59  --   BILITOT 1.2  --   --   --   --   --  2.0*  --   ALBUMIN 2.8*  --   --   --   --   --  2.7*  --   MG 2.3  --   --   --   --   --  2.2  --   HGBA1C  --   --   --   --  6.6*  --   --   --   BNP  --   --   --  430.5*  --   --   --   --    < > = values in this interval not displayed.    Recent Labs  Lab 09/30/20 0250  LIPASE 54*    Recent Labs  Lab 10/03/20 0255  BNP 430.5*    ------------------------------------------------------------------------------------------------------------------ No results for input(s): CHOL, HDL, LDLCALC, TRIG, CHOLHDL, LDLDIRECT in the last 72 hours.  Lab Results  Component Value Date  HGBA1C 6.6 (H) 10/03/2020   ------------------------------------------------------------------------------------------------------------------ No results for input(s): TSH, T4TOTAL, T3FREE, THYROIDAB in the last 72 hours.  Invalid input(s): FREET3 ------------------------------------------------------------------------------------------------------------------ No results for input(s): VITAMINB12, FOLATE, FERRITIN, TIBC, IRON, RETICCTPCT in the last  72 hours.  Coagulation profile No results for input(s): INR, PROTIME in the last 168 hours. No results for input(s): DDIMER in the last 72 hours.  Cardiac Enzymes No results for input(s): CKTOTAL, CKMB, CKMBINDEX, TROPONINI in the last 168 hours.  ------------------------------------------------------------------------------------------------------------------    Component Value Date/Time   BNP 430.5 (H) 10/03/2020 0255     Antibiotics: Anti-infectives (From admission, onward)    Start     Dose/Rate Route Frequency Ordered Stop   10/03/20 1700  fluconazole (DIFLUCAN) tablet 200 mg        200 mg Oral Daily 10/03/20 1606 10/17/20 0959   09/23/20 1800  cefTRIAXone (ROCEPHIN) 2 g in sodium chloride 0.9 % 100 mL IVPB        2 g 200 mL/hr over 30 Minutes Intravenous Every 24 hours 09/22/20 1842 09/29/20 1749   09/23/20 1800  azithromycin (ZITHROMAX) 500 mg in sodium chloride 0.9 % 250 mL IVPB  Status:  Discontinued        500 mg 250 mL/hr over 60 Minutes Intravenous Every 24 hours 09/22/20 1842 09/25/20 1456   09/22/20 1630  cefTRIAXone (ROCEPHIN) 1 g in sodium chloride 0.9 % 100 mL IVPB        1 g 200 mL/hr over 30 Minutes Intravenous  Once 09/22/20 1618 09/22/20 1806   09/22/20 1630  azithromycin (ZITHROMAX) 500 mg in sodium chloride 0.9 % 250 mL IVPB        500 mg 250 mL/hr over 60 Minutes Intravenous  Once 09/22/20 1618 09/22/20 1939        Radiology Reports  DG Chest 1 View  Result Date: 10/04/2020 CLINICAL DATA:  Left side chest pain EXAM: CHEST  1 VIEW COMPARISON:  09/29/2020 FINDINGS: Heart is normal size. Bibasilar atelectasis or infiltrates, slightly improved since prior study. Suspect small left effusion. Aortic atherosclerosis. No acute bony abnormality. IMPRESSION: Bilateral lower atelectasis or infiltrates, slightly improved since prior study. Small left effusion. Electronically Signed   By: Charlett NoseKevin  Dover M.D.   On: 10/04/2020 19:45      DVT prophylaxis: Eliquis  on hold due to hematuria, SCDs  Code Status: DNR  Family Communication: No family at bedside   Consultants: Cardiology Pulmonology  Procedures:     Objective    Physical Examination:   General-appears in no acute distress Heart-S1-S2, regular, no murmur auscultated Lungs-clear to auscultation bilaterally, no wheezing or crackles auscultated Abdomen-soft, nontender, no organomegaly Extremities-no edema in the lower extremities Neuro-alert, oriented x3, no focal deficit noted  Status is: Inpatient  Dispo: The patient is from: Home              Anticipated d/c is to: Skilled nursing facility              Anticipated d/c date is: 10/09/2020              Patient currently not stable for discharge  Barrier to discharge-ongoing treatment for respiratory failure  COVID-19 Labs  No results for input(s): DDIMER, FERRITIN, LDH, CRP in the last 72 hours.  Lab Results  Component Value Date   SARSCOV2NAA NEGATIVE 09/24/2020   SARSCOV2NAA NEGATIVE 09/22/2020    Microbiology  Recent Results (from the past 240 hour(s))  Urine Culture     Status: Abnormal  Collection Time: 10/01/20 10:13 AM   Specimen: Urine, Catheterized  Result Value Ref Range Status   Specimen Description   Final    URINE, CATHETERIZED Performed at [facility], [address], [place], [place] [address]    Special Requests   Final    NONE Performed at [facility], [address] [place], [place] [address]    Culture 40,000 COLONIES/mL YEAST (A)  Final   Report Status 10/02/2020 FINAL  Final             Meredeth Ide   [facility] If 7PM-7AM, please contact night-coverage at [email or web address], Office  [phone]   10/06/2020, 2:52 PM  LOS: 14 days

## 2020-10-07 ENCOUNTER — Inpatient Hospital Stay (HOSPITAL_COMMUNITY): Payer: Medicare HMO

## 2020-10-07 DIAGNOSIS — I5031 Acute diastolic (congestive) heart failure: Secondary | ICD-10-CM | POA: Diagnosis not present

## 2020-10-07 DIAGNOSIS — I4891 Unspecified atrial fibrillation: Secondary | ICD-10-CM | POA: Diagnosis not present

## 2020-10-07 DIAGNOSIS — J9601 Acute respiratory failure with hypoxia: Secondary | ICD-10-CM | POA: Diagnosis not present

## 2020-10-07 DIAGNOSIS — J9602 Acute respiratory failure with hypercapnia: Secondary | ICD-10-CM | POA: Diagnosis not present

## 2020-10-07 DIAGNOSIS — J441 Chronic obstructive pulmonary disease with (acute) exacerbation: Secondary | ICD-10-CM | POA: Diagnosis not present

## 2020-10-07 DIAGNOSIS — J9621 Acute and chronic respiratory failure with hypoxia: Secondary | ICD-10-CM | POA: Diagnosis not present

## 2020-10-07 LAB — BASIC METABOLIC PANEL
Anion gap: 15 (ref 5–15)
BUN: 44 mg/dL — ABNORMAL HIGH (ref 8–23)
CO2: 33 mmol/L — ABNORMAL HIGH (ref 22–32)
Calcium: 8.6 mg/dL — ABNORMAL LOW (ref 8.9–10.3)
Chloride: 80 mmol/L — ABNORMAL LOW (ref 98–111)
Creatinine, Ser: 0.78 mg/dL (ref 0.44–1.00)
GFR, Estimated: >60 mL/min (ref >60)
Glucose, Bld: 149 mg/dL — ABNORMAL HIGH (ref 70–99)
Potassium: 3.2 mmol/L — ABNORMAL LOW (ref 3.5–5.1)
Sodium: 128 mmol/L — ABNORMAL LOW (ref 135–145)

## 2020-10-07 LAB — OSMOLALITY: Osmolality: 296 mOsm/kg — ABNORMAL HIGH (ref 275–295)

## 2020-10-07 LAB — OCCULT BLOOD GASTRIC / DUODENUM (SPECIMEN CUP)
Occult Blood, Gastric: POSITIVE — AB
pH, Gastric: 2

## 2020-10-07 LAB — GLUCOSE, CAPILLARY
Glucose-Capillary: 196 mg/dL — ABNORMAL HIGH (ref 70–99)
Glucose-Capillary: 191 mg/dL — ABNORMAL HIGH (ref 70–99)
Glucose-Capillary: 146 mg/dL — ABNORMAL HIGH (ref 70–99)
Glucose-Capillary: 151 mg/dL — ABNORMAL HIGH (ref 70–99)

## 2020-10-07 MED ORDER — FUROSEMIDE 10 MG/ML IJ SOLN
40.0000 mg | Freq: Two times a day (BID) | INTRAMUSCULAR | Status: DC
  Administered 2020-10-07: 40 mg via INTRAVENOUS
  Filled 2020-10-07: qty 4

## 2020-10-07 MED ORDER — POTASSIUM CHLORIDE CRYS ER 20 MEQ PO TBCR
40.0000 meq | EXTENDED_RELEASE_TABLET | Freq: Once | ORAL | Status: AC
  Administered 2020-10-07: 40 meq via ORAL
  Filled 2020-10-07: qty 2

## 2020-10-07 MED ORDER — PANTOPRAZOLE SODIUM 40 MG IV SOLR
40.0000 mg | Freq: Two times a day (BID) | INTRAVENOUS | Status: AC
  Administered 2020-10-07 – 2020-10-10 (×6): 40 mg via INTRAVENOUS
  Filled 2020-10-07 (×6): qty 40

## 2020-10-07 MED ORDER — POTASSIUM CHLORIDE 10 MEQ/100ML IV SOLN
INTRAVENOUS | Status: AC
  Filled 2020-10-07: qty 100

## 2020-10-07 MED ORDER — POTASSIUM CHLORIDE 10 MEQ/100ML IV SOLN
10.0000 meq | INTRAVENOUS | Status: AC
  Administered 2020-10-07 (×6): 10 meq via INTRAVENOUS
  Filled 2020-10-07 (×5): qty 100

## 2020-10-07 MED ORDER — BISACODYL 10 MG RE SUPP: 10.0000 mg | Freq: Once | RECTAL | Status: DC

## 2020-10-07 MED ORDER — IOHEXOL 9 MG/ML PO SOLN
500.0000 mL | ORAL | Status: AC
Start: 2020-10-07 — End: 2020-10-07

## 2020-10-07 MED ORDER — ONDANSETRON HCL 4 MG/2ML IJ SOLN
INTRAMUSCULAR | Status: AC
  Filled 2020-10-07: qty 2

## 2020-10-07 MED ORDER — IOHEXOL 9 MG/ML PO SOLN
ORAL | Status: AC
  Filled 2020-10-07: qty 1000

## 2020-10-07 MED ORDER — FLUCONAZOLE 100 MG PO TABS
200.0000 mg | ORAL_TABLET | Freq: Every day | ORAL | Status: DC
  Administered 2020-10-08: 200 mg via ORAL
  Filled 2020-10-07: qty 2

## 2020-10-07 MED ORDER — ONDANSETRON HCL 4 MG/2ML IJ SOLN
4.0000 mg | Freq: Four times a day (QID) | INTRAMUSCULAR | Status: AC | PRN
  Administered 2020-10-07: 4 mg via INTRAVENOUS

## 2020-10-07 NOTE — Plan of Care (Signed)
  Problem: Safety: Goal: Ability to remain free from injury will improve Outcome: Progressing   Problem: Nutrition: Goal: Adequate nutrition will be maintained Outcome: Not Progressing   Problem: Pain Managment: Goal: General experience of comfort will improve Outcome: Not Progressing   Problem: Skin Integrity: Goal: Risk for impaired skin integrity will decrease Outcome: Not Progressing

## 2020-10-07 NOTE — Consult Note (Signed)
Consult Note  Kimberly Ballard 16-Sep-1940  [id number].    Requesting MD: Dr. Mauro Kaufmann Chief Complaint/Reason for Consult: Ileus vs constipation HPI:  Patient is a 80 year old female who is currently admitted for multifocal PNA with COPD. Hospital course has been complicated by A. Fib with RVR, AKI, and need for BiPAP. She also grew out candida on urine culture and was treated with diflucan for this. Patient developed worsened abdominal pain and distention today and nausea vomiting. Patient reportedly had diarrhea for several days but no solid BMs. CT 8/30 was unremarkable. Abdominal film today showed ileus with moderate colonic stool burden.   PMH significant for COPD, elevated BP and recent COVID infection 09/18/20. Past abdominal surgery includes laparoscopic cholecystectomy. Patient is not on any blood thinning medications as an outpatient. NKDA. She reports previous but not current tobacco use, rare alcohol use and denies illicit drug use.   ROS: Review of Systems  Constitutional:  Negative for chills and fever.  Respiratory:  Positive for shortness of breath. Negative for wheezing.   Cardiovascular:  Negative for chest pain and palpitations.  Gastrointestinal:  Positive for abdominal pain, constipation, diarrhea, nausea and vomiting. Negative for blood in stool and melena.  All other systems reviewed and are negative.  Family History  Problem Relation Age of Onset   Diabetes Neg Hx    CAD Neg Hx     Past Medical History:  Diagnosis Date   COPD (chronic obstructive pulmonary disease) (HCC)     History reviewed. No pertinent surgical history.  Social History:  reports that she has quit smoking. Her smoking use included cigarettes. She has never used smokeless tobacco. She reports current alcohol use of about 2.0 standard drinks per week. She reports that she does not use drugs.  Allergies: No Known Allergies  Medications Prior to Admission  Medication Sig Dispense  Refill   aspirin 81 MG EC tablet Take 81 mg by mouth daily.     PAXLOVID, 300/100, 20 x 150 MG & 10 x 100MG  TBPK Take 3 tablets by mouth 2 (two) times daily.     TRELEGY ELLIPTA 100-62.5-25 MCG/INH AEPB Inhale 1 puff into the lungs daily.      Blood pressure 115/75, pulse (!) 114, temperature 97.8 F (36.6 C), temperature source Oral, resp. rate (!) 40, height 5\' 3"  (1.6 m), weight 87.3 kg, SpO2 (!) 89 %. Physical Exam:  General: pleasant, WD, chronically ill appearing female who is laying in bed in NAD HEENT: head is normocephalic, atraumatic.  Sclera are noninjected. Ears and nose without any masses or lesions.  Mouth is pink and moist Heart: irregularly irregular. Palpable radial and pedal pulses bilaterally Lungs: CTAB, no wheezes, rhonchi, or rales noted.  Respiratory effort nonlabored Abd: soft, mild ttp in RLQ without peritonitis, mild-mod distention, BS hypoactive MS: all 4 extremities are symmetrical with no cyanosis, clubbing, or edema. Skin: warm and dry with no masses, lesions, or rashes Neuro: Cranial nerves 2-12 grossly intact, sensation is normal throughout Psych: A&Ox3 with an appropriate affect.   Results for orders placed or performed during the hospital encounter of 09/22/20 (from the past 48 hour(s))  Glucose, capillary     Status: Abnormal   Collection Time: 10/05/20  4:42 PM  Result Value Ref Range   Glucose-Capillary 152 (H) 70 - 99 mg/dL    Comment: Glucose reference range applies only to samples taken after fasting for at least 8 hours.  Glucose, capillary  Status: Abnormal   Collection Time: 10/05/20 10:22 PM  Result Value Ref Range   Glucose-Capillary 172 (H) 70 - 99 mg/dL    Comment: Glucose reference range applies only to samples taken after fasting for at least 8 hours.  CBC     Status: Abnormal   Collection Time: 10/06/20  2:51 AM  Result Value Ref Range   WBC 18.1 (H) 4.0 - 10.5 K/uL   RBC 5.28 (H) 3.87 - 5.11 MIL/uL   Hemoglobin 16.7 (H) 12.0 -  15.0 g/dL   HCT 81.1 (H) 91.4 - 78.2 %   MCV 91.5 80.0 - 100.0 fL   MCH 31.6 26.0 - 34.0 pg   MCHC 34.6 30.0 - 36.0 g/dL   RDW 95.6 21.3 - 08.6 %   Platelets 209 150 - 400 K/uL   nRBC 0.0 0.0 - 0.2 %    Comment: Performed at [facility], [address] [place], [place] [address]  Basic metabolic panel     Status: Abnormal   Collection Time: 10/06/20  2:51 AM  Result Value Ref Range   Sodium 132 (L) 135 - 145 mmol/L   Potassium 3.4 (L) 3.5 - 5.1 mmol/L   Chloride 79 (L) 98 - 111 mmol/L   CO2 41 (H) 22 - 32 mmol/L   Glucose, Bld 115 (H) 70 - 99 mg/dL    Comment: Glucose reference range applies only to samples taken after fasting for at least 8 hours.   BUN 36 (H) 8 - 23 mg/dL   Creatinine, Ser 9.62 0.44 - 1.00 mg/dL   Calcium 8.4 (L) 8.9 - 10.3 mg/dL   GFR, Estimated >95 >28 mL/min    Comment: (NOTE) Calculated using the CKD-EPI Creatinine Equation (2021)    Anion gap 12 5 - 15    Comment: Performed at [facility], [address] [place], [place] [address]  Glucose, capillary     Status: Abnormal   Collection Time: 10/06/20  7:49 AM  Result Value Ref Range   Glucose-Capillary 134 (H) 70 - 99 mg/dL    Comment: Glucose reference range applies only to samples taken after fasting for at least 8 hours.  Glucose, capillary     Status: Abnormal   Collection Time: 10/06/20 11:10 AM  Result Value Ref Range   Glucose-Capillary 136 (H) 70 - 99 mg/dL    Comment: Glucose reference range applies only to samples taken after fasting for at least 8 hours.   Comment 1 Notify RN    Comment 2 Document in Chart   Glucose, capillary     Status: Abnormal   Collection Time: 10/06/20  5:08 PM  Result Value Ref Range   Glucose-Capillary 218 (H) 70 - 99 mg/dL    Comment: Glucose reference range applies only to samples taken after fasting for at least 8 hours.  Glucose, capillary     Status: Abnormal   Collection Time: 10/06/20  9:01 PM  Result Value Ref  Range   Glucose-Capillary 167 (H) 70 - 99 mg/dL    Comment: Glucose reference range applies only to samples taken after fasting for at least 8 hours.  Basic metabolic panel     Status: Abnormal   Collection Time: 10/07/20  2:53 AM  Result Value Ref Range   Sodium 128 (L) 135 - 145 mmol/L   Potassium 3.2 (L) 3.5 - 5.1 mmol/L   Chloride 80 (L) 98 - 111 mmol/L   CO2 33 (H) 22 - 32 mmol/L  Glucose, Bld 149 (H) 70 - 99 mg/dL    Comment: Glucose reference range applies only to samples taken after fasting for at least 8 hours.   BUN 44 (H) 8 - 23 mg/dL   Creatinine, Ser 9.47 0.44 - 1.00 mg/dL   Calcium 8.6 (L) 8.9 - 10.3 mg/dL   GFR, Estimated >65 >46 mL/min    Comment: (NOTE) Calculated using the CKD-EPI Creatinine Equation (2021)    Anion gap 15 5 - 15    Comment: Performed at [facility], [address] [place], [place] [address]  Glucose, capillary     Status: Abnormal   Collection Time: 10/07/20  7:37 AM  Result Value Ref Range   Glucose-Capillary 196 (H) 70 - 99 mg/dL    Comment: Glucose reference range applies only to samples taken after fasting for at least 8 hours.   Comment 1 Notify RN    Comment 2 Document in Chart   Glucose, capillary     Status: Abnormal   Collection Time: 10/07/20 12:19 PM  Result Value Ref Range   Glucose-Capillary 191 (H) 70 - 99 mg/dL    Comment: Glucose reference range applies only to samples taken after fasting for at least 8 hours.   Comment 1 Notify RN    Comment 2 Document in Chart    DG Abd Portable 1V  Result Date: 10/07/2020 CLINICAL DATA:  Worsening abdominal pain and distension this morning. EXAM: PORTABLE ABDOMEN - 1 VIEW COMPARISON:  09/30/2020; CT abdomen pelvis-09/30/2020 FINDINGS: Moderate to marked gaseous distension of multiple loops of distal small bowel as well as the cecum, ascending and transverse colon, progressed compared to the 09/30/2020 examination. Moderate colonic stool burden. Nondiagnostic evaluation  for pneumoperitoneum secondary to supine positioning and exclusion of the lower thorax. No pneumatosis or portal venous gas. No definitive evidence of volvulus. Post cholecystectomy. No definitive abnormal intra-abdominal calcifications. Degenerative change of the lower lumbar spine is suspected though incompletely evaluated. IMPRESSION: 1. Worsening gaseous distension of the distal small bowel and colon nonspecific though presumably secondary to ileus. Continued attention on follow-up is advised. 2. Moderate colonic stool burden. Electronically Signed   By: Simonne Come M.D.   On: 10/07/2020 08:22      Assessment/Plan Ileus vs constipation - recommend NGT placement for n/v - recommend repeat CT AP with PO and IV contrast - pending results of CT may need enemas to clear out stool burden noted on film from earlier today - recommend keeping K>4.0 and Mg > 2.0 to optimize bowel function - mobilize as able   FEN: NPO, IVF per TRH, KCl runs ordered VTE: SCDs ID:[id number] diflucan 9/2>>  COPD with PNA AKI A. Fib with RVR UTI RML pulmonary nodule   Juliet Rude, [facility] Surgery 10/07/2020, 1:04 PM Please see Amion for pager number during day hours 7:00am-4:30pm

## 2020-10-07 NOTE — Progress Notes (Signed)
[facility]  PROGRESS NOTE  Kimberly Ballard LNL:[id number] DOB: Apr 21, 1940 DOA: 09/22/2020 PCP: System, Provider Not In   Brief HPI:   80 year old female with a history of COPD presented with shortness of breath.  She was found to be hypoxemic requiring 6 L of oxygen.  CTA chest ruled out pulmonary embolism but showed bilateral infiltrates.  Patient started on Rocephin and Zithromax for CAP.  Also went into A. fib with RVR required BiPAP.  Cardiology and pulmonology were consulted.  She was started on Cardizem drip and heparin GTT.  She has been transitioned to p.o. Cardizem.  Diuresed well with IV Lasix.  Patient is DNR.  She also developed hematuria.  Started on CBI per urology.  Urine culture grew Candida, started on Diflucan.    Subjective   Patient seen and examined, developed significant abdominal distention this morning.  KUB done this morning shows ileus with moderate stool burden in colon.   Assessment/Plan:       Acute hypoxemic/hypercapnic respiratory failure requiring BiPAP -Multifactorial from multifocal pneumonia, diastolic CHF -CTA chest ruled out pulmonary embolism, showed multifocal pneumonia -Completed antibiotics ,ceftriaxone Zithromax for 5 days -Started on Lasix 60 mg IV twice daily per cardiology; Lasix dose changed to 40 mg IV every 12 hours -Continues on HFNC 20 L/min -BiPAP as needed -There was concern for COVID-pneumonia however patient was COVID-negative x2 -Continue Brovana/Pulmicort/Yupelri -Continue as needed Xopenex  Ileus -KUB shows moderate stool burden - multiple dilated small bowel loops -Patient vomited her medications this morning -We will keep n.p.o., insert NG tube -We will consult general surgery for further recommendations  Atrial fibrillation with RVR -Heart rate is controlled -Continue Cardizem, metoprolol -Eliquis on hold due to hematuria amenorrhea  Acute diastolic CHF -As per echo EF 55 to 60% -Cardiology recommended  continue diuresis with IV Lasix twice daily  Acute kidney injury -Resolved  Elevated troponin -Likely demand ischemia -Cardiology following, evaluation as outpatient  Right middle lobe nodule -Repeat CT chest in 3 months  Hematuria -Urology consulted, started on CBI -Hold Eliquis until hematuria resolves  Hyponatremia -Likely from diuresis, poor p.o. intake, now developed ileus -Check serum and urine osmolality -Follow BMP in am  Yeast in urine -Started on Diflucan for 7 days  Hypokalemia -Replace potassium and follow BMP in am  Scheduled medications:    arformoterol  15 mcg Nebulization BID   budesonide (PULMICORT) nebulizer solution  0.5 mg Nebulization BID   chlorhexidine  15 mL Mouth Rinse BID   Chlorhexidine Gluconate Cloth  6 each Topical Daily   diltiazem  240 mg Oral Daily   fluconazole  200 mg Oral Daily   furosemide  40 mg Intravenous BID   Gerhardt's butt cream   Topical TID   guaiFENesin  600 mg Oral BID   insulin aspart  0-9 Units Subcutaneous TID WC   iohexol  500 mL Oral Q1H   iohexol       mouth rinse  15 mL Mouth Rinse q12n4p   metoprolol tartrate  25 mg Oral BID   polyethylene glycol  17 g Oral BID   revefenacin  175 mcg Nebulization Daily         Data Reviewed:   CBG:  Recent Labs  Lab 10/06/20 1110 10/06/20 1708 10/06/20 2101 10/07/20 0737 10/07/20 1219  GLUCAP 136* 218* 167* 196* 191*    SpO2: (!) 88 % O2 Flow Rate (L/min): 20 L/min FiO2 (%): 50 %    Vitals:   10/07/20 0815 10/07/20 0933  10/07/20 1200 10/07/20 1406  BP:  115/75    Pulse:  (!) 114    Resp:      Temp:   97.8 F (36.6 C)   TempSrc:   Oral   SpO2: (!) 89%   (!) 88%  Weight:      Height:         Intake/Output Summary (Last 24 hours) at 10/07/2020 1422 Last data filed at 10/07/2020 1130 Gross per 24 hour  Intake --  Output 1683 ml  Net -1683 ml    09/04 1901 - 09/06 0700 In: 200 [P.O.:200] Out: 2500 [Urine:2500]  Filed Weights   10/04/20 0500  10/06/20 0500 10/07/20 0500  Weight: 87.1 kg 89.5 kg 87.3 kg    CBC:  Recent Labs  Lab 10/01/20 0249 10/03/20 1532 10/04/20 0230 10/05/20 0304 10/06/20 0251  WBC 20.2*  --  25.1* 21.4* 18.1*  HGB 18.5* 17.9* 17.5* 18.3* 16.7*  HCT 56.0* 51.9* 50.6* 53.1* 48.3*  PLT 270  --  247 222 209  MCV 94.9  --  92.0 92.7 91.5  MCH 31.4  --  31.8 31.9 31.6  MCHC 33.0  --  34.6 34.5 34.6  RDW 13.2  --  12.8 13.2 13.1    Complete metabolic panel:  Recent Labs  Lab 10/03/20 0255 10/03/20 1226 10/04/20 0230 10/05/20 0304 10/06/20 0251 10/07/20 0253  NA 129*  --  131* 132* 132* 128*  K 3.9  --  3.6 3.5 3.4* 3.2*  CL 79*  --  76* 80* 79* 80*  CO2 39*  --  40* 43* 41* 33*  GLUCOSE 152*  --  123* 117* 115* 149*  BUN 54*  --  45* 40* 36* 44*  CREATININE 0.85  --  0.69 0.60 0.66 0.78  CALCIUM 8.5*  --  8.4* 8.4* 8.4* 8.6*  AST  --   --   --  39  --   --   ALT  --   --   --  83*  --   --   ALKPHOS  --   --   --  59  --   --   BILITOT  --   --   --  2.0*  --   --   ALBUMIN  --   --   --  2.7*  --   --   MG  --   --   --  2.2  --   --   HGBA1C  --  6.6*  --   --   --   --   BNP 430.5*  --   --   --   --   --     No results for input(s): LIPASE, AMYLASE in the last 168 hours.   Recent Labs  Lab 10/03/20 0255  BNP 430.5*    ------------------------------------------------------------------------------------------------------------------ No results for input(s): CHOL, HDL, LDLCALC, TRIG, CHOLHDL, LDLDIRECT in the last 72 hours.  Lab Results  Component Value Date   HGBA1C 6.6 (H) 10/03/2020   ------------------------------------------------------------------------------------------------------------------ No results for input(s): TSH, T4TOTAL, T3FREE, THYROIDAB in the last 72 hours.  Invalid input(s): FREET3 ------------------------------------------------------------------------------------------------------------------ No results for input(s): VITAMINB12, FOLATE, FERRITIN,  TIBC, IRON, RETICCTPCT in the last 72 hours.  Coagulation profile No results for input(s): INR, PROTIME in the last 168 hours. No results for input(s): DDIMER in the last 72 hours.  Cardiac Enzymes No results for input(s): CKTOTAL, CKMB, CKMBINDEX, TROPONINI in the last 168 hours.  ------------------------------------------------------------------------------------------------------------------    Component Value  Date/Time   BNP 430.5 (H) 10/03/2020 0255     Antibiotics: Anti-infectives (From admission, onward)    Start     Dose/Rate Route Frequency Ordered Stop   10/03/20 1700  fluconazole (DIFLUCAN) tablet 200 mg        200 mg Oral Daily 10/03/20 1606 10/17/20 0959   09/23/20 1800  cefTRIAXone (ROCEPHIN) 2 g in sodium chloride 0.9 % 100 mL IVPB        2 g 200 mL/hr over 30 Minutes Intravenous Every 24 hours 09/22/20 1842 09/29/20 1749   09/23/20 1800  azithromycin (ZITHROMAX) 500 mg in sodium chloride 0.9 % 250 mL IVPB  Status:  Discontinued        500 mg 250 mL/hr over 60 Minutes Intravenous Every 24 hours 09/22/20 1842 09/25/20 1456   09/22/20 1630  cefTRIAXone (ROCEPHIN) 1 g in sodium chloride 0.9 % 100 mL IVPB        1 g 200 mL/hr over 30 Minutes Intravenous  Once 09/22/20 1618 09/22/20 1806   09/22/20 1630  azithromycin (ZITHROMAX) 500 mg in sodium chloride 0.9 % 250 mL IVPB        500 mg 250 mL/hr over 60 Minutes Intravenous  Once 09/22/20 1618 09/22/20 1939        Radiology Reports  DG Abd Portable 1V  Result Date: 10/07/2020 CLINICAL DATA:  Worsening abdominal pain and distension this morning. EXAM: PORTABLE ABDOMEN - 1 VIEW COMPARISON:  09/30/2020; CT abdomen pelvis-09/30/2020 FINDINGS: Moderate to marked gaseous distension of multiple loops of distal small bowel as well as the cecum, ascending and transverse colon, progressed compared to the 09/30/2020 examination. Moderate colonic stool burden. Nondiagnostic evaluation for pneumoperitoneum secondary to supine  positioning and exclusion of the lower thorax. No pneumatosis or portal venous gas. No definitive evidence of volvulus. Post cholecystectomy. No definitive abnormal intra-abdominal calcifications. Degenerative change of the lower lumbar spine is suspected though incompletely evaluated. IMPRESSION: 1. Worsening gaseous distension of the distal small bowel and colon nonspecific though presumably secondary to ileus. Continued attention on follow-up is advised. 2. Moderate colonic stool burden. Electronically Signed   By: Simonne ComeJohn  Watts M.D.   On: 10/07/2020 08:22      DVT prophylaxis: Eliquis on hold due to hematuria, SCDs  Code Status: DNR  Family Communication: No family at bedside   Consultants: Cardiology Pulmonology  Procedures:     Objective    Physical Examination:   General-appears in no acute distress Heart-S1-S2, regular, no murmur auscultated Lungs-decreased breath sounds bilaterally Abdomen-soft, significantly distended, nontender to palpation, no organomegalys Extremities-no edema in the lower extremities Neuro-alert, oriented x3, no focal deficit noted  Status is: Inpatient  Dispo: The patient is from: Home              Anticipated d/c is to: Skilled nursing facility              Anticipated d/c date is: 10/09/2020              Patient currently not stable for discharge  Barrier to discharge-ongoing treatment for respiratory failure  COVID-19 Labs  No results for input(s): DDIMER, FERRITIN, LDH, CRP in the last 72 hours.  Lab Results  Component Value Date   SARSCOV2NAA NEGATIVE 09/24/2020   SARSCOV2NAA NEGATIVE 09/22/2020    Microbiology  Recent Results (from the past 240 hour(s))  Urine Culture     Status: Abnormal   Collection Time: 10/01/20 10:13 AM   Specimen: Urine, Catheterized  Result Value Ref Range  Status   Specimen Description   Final    URINE, CATHETERIZED Performed at [facility], [address], [place], [place]  [address]    Special Requests   Final    NONE Performed at [facility], [address] [place], [place] [address]    Culture 40,000 COLONIES/mL YEAST (A)  Final   Report Status 10/02/2020 FINAL  Final             Meredeth Ide   [facility] If 7PM-7AM, please contact night-coverage at [email or web address], Office  [phone]   10/07/2020, 2:22 PM  LOS: 15 days

## 2020-10-07 NOTE — Progress Notes (Signed)
PT Cancellation Note  Patient Details Name: Kimberly Ballard MRN: [id number] DOB: 1940-03-09   Cancelled Treatment:    Reason Eval/Treat Not Completed: Medical issues which prohibited therapy   [name] 10/07/2020, 12:30 PM Blanchard Kelch PT Acute Rehabilitation Services Pager [phone] Office [phone]

## 2020-10-07 NOTE — Progress Notes (Signed)
OT Cancellation Note  Patient Details Name: Kimberly Ballard MRN: [id number] DOB: 1940-08-06   Cancelled Treatment:    Reason Eval/Treat Not Completed: Medical issues which prohibited therapy RN states patient with dark emesis, tried to place NG tube earlier and were unsuccessful. Going for CT scan soon. Will re-attempt 9/7 as patient able.  [name] OT OT pager: [phone]   [name] 10/07/2020, 12:42 PM

## 2020-10-07 NOTE — Progress Notes (Addendum)
Progress Note  Patient Name: Kimberly Ballard Date of Encounter: 10/07/2020  [facility] Cardiologist: Dr. Mayford Knife  Subjective   Seems like we are making little progress. Patient states she required BiPAP last night. She does not feel like her breathing has improved much since admission. Heart rates reasonably well controlled in the 80s to 105s at baseline (up to the 110s to 120s while eating or with activity).  Inpatient Medications    Scheduled Meds:  arformoterol  15 mcg Nebulization BID   budesonide (PULMICORT) nebulizer solution  0.5 mg Nebulization BID   chlorhexidine  15 mL Mouth Rinse BID   Chlorhexidine Gluconate Cloth  6 each Topical Daily   diltiazem  240 mg Oral Daily   fluconazole  200 mg Oral Daily   furosemide  60 mg Intravenous BID   Gerhardt's butt cream   Topical TID   guaiFENesin  600 mg Oral BID   insulin aspart  0-9 Units Subcutaneous TID WC   mouth rinse  15 mL Mouth Rinse q12n4p   metoprolol tartrate  25 mg Oral BID   polyethylene glycol  17 g Oral BID   revefenacin  175 mcg Nebulization Daily   Continuous Infusions:  sodium chloride Stopped (09/29/20 1824)   PRN Meds: sodium chloride, levalbuterol, metoprolol tartrate, oxybutynin, simethicone, sodium chloride   Vital Signs    Vitals:   10/07/20 0700 10/07/20 0740 10/07/20 0800 10/07/20 0815  BP: 126/76  116/72   Pulse: 63  75   Resp: (!) 36  (!) 40   Temp:   97.6 F (36.4 C)   TempSrc:   Oral   SpO2: 90% 93% (!) 88% (!) 89%  Weight:      Height:        Intake/Output Summary (Last 24 hours) at 10/07/2020 0904 Last data filed at 10/07/2020 0543 Gross per 24 hour  Intake --  Output 2025 ml  Net -2025 ml   Last 3 Weights 10/07/2020 10/06/2020 10/04/2020  Weight (lbs) 192 lb 7.4 oz 197 lb 5 oz 192 lb 0.3 oz  Weight (kg) 87.3 kg 89.5 kg 87.1 kg      Telemetry    Atrial fibrillation with rates in the 80s to low 100s at baseline (occasionally up to 110s to 120s while eating or with minimal  activity/movement). - Personally Reviewed  ECG    No new ECG tracing today.  Physical Exam   GEN: No acute distress.   Neck: No JVD. Cardiac: Tachycardic with irregularly irregular rhythm. No murmurs, rubs, or gallops.  Respiratory: Mild increased work of breathing with wheezing noted in bilateral bases. GI: Soft and non-distended but tender to the touch. MS: Trace lower extremity edema bilaterally. No deformity. Skin: Warm and dry. Neuro:  No focal deficits.  Psych: Normal affect. Responds appropriately.  Labs    High Sensitivity Troponin:   Recent Labs  Lab 09/22/20 2148 09/23/20 0207 09/24/20 0239 10/04/20 1721 10/04/20 1923  TROPONINIHS 128* 100* 52* 59* 67*      Chemistry Recent Labs  Lab 10/05/20 0304 10/06/20 0251 10/07/20 0253  NA 132* 132* 128*  K 3.5 3.4* 3.2*  CL 80* 79* 80*  CO2 43* 41* 33*  GLUCOSE 117* 115* 149*  BUN 40* 36* 44*  CREATININE 0.60 0.66 0.78  CALCIUM 8.4* 8.4* 8.6*  PROT 5.7*  --   --   ALBUMIN 2.7*  --   --   AST 39  --   --   ALT 83*  --   --  ALKPHOS 59  --   --   BILITOT 2.0*  --   --   GFRNONAA >60 >60 >60  ANIONGAP 9 12 15      Hematology Recent Labs  Lab 10/04/20 0230 10/05/20 0304 10/06/20 0251  WBC 25.1* 21.4* 18.1*  RBC 5.50* 5.73* 5.28*  HGB 17.5* 18.3* 16.7*  HCT 50.6* 53.1* 48.3*  MCV 92.0 92.7 91.5  MCH 31.8 31.9 31.6  MCHC 34.6 34.5 34.6  RDW 12.8 13.2 13.1  PLT 247 222 209    BNP Recent Labs  Lab 10/03/20 0255  BNP 430.5*     DDimer No results for input(s): DDIMER in the last 168 hours.   Radiology    DG Abd Portable 1V  Result Date: 10/07/2020 CLINICAL DATA:  Worsening abdominal pain and distension this morning. EXAM: PORTABLE ABDOMEN - 1 VIEW COMPARISON:  09/30/2020; CT abdomen pelvis-09/30/2020 FINDINGS: Moderate to marked gaseous distension of multiple loops of distal small bowel as well as the cecum, ascending and transverse colon, progressed compared to the 09/30/2020 examination.  Moderate colonic stool burden. Nondiagnostic evaluation for pneumoperitoneum secondary to supine positioning and exclusion of the lower thorax. No pneumatosis or portal venous gas. No definitive evidence of volvulus. Post cholecystectomy. No definitive abnormal intra-abdominal calcifications. Degenerative change of the lower lumbar spine is suspected though incompletely evaluated. IMPRESSION: 1. Worsening gaseous distension of the distal small bowel and colon nonspecific though presumably secondary to ileus. Continued attention on follow-up is advised. 2. Moderate colonic stool burden. Electronically Signed   By: 10/02/2020 M.D.   On: 10/07/2020 08:22    Cardiac Studies   Complete Echo 09/23/2020: Impressions:  1. Left ventricular ejection fraction, by estimation, is 55 to 60%. Left  ventricular ejection fraction by 3D volume is 59 %. The left ventricle has  normal function. The left ventricle has no regional wall motion  abnormalities. There is moderate left  ventricular hypertrophy. Left ventricular diastolic parameters are  consistent with Grade I diastolic dysfunction (impaired relaxation).   2. Right ventricular systolic function mildly reduced at base and mid,  with relative preserved apical systolic function. The right ventricular  size is moderately enlarged. There is moderately elevated pulmonary artery  systolic pressure. The estimated  right ventricular systolic pressure is 48.4 mmHg.   3. Left atrial size was mildly dilated.   4. Right atrial size was moderately dilated.   5. The mitral valve is normal in structure. Trivial mitral valve  regurgitation. No evidence of mitral stenosis.   6. Tricuspid valve regurgitation is mild to moderate.   7. The aortic valve is normal in structure. Aortic valve regurgitation is  not visualized. No aortic stenosis is present.   8. The inferior vena cava is dilated in size with <50% respiratory  variability, suggesting right atrial pressure of 15  mmHg.  _______________  Limited Echo with Bubble Study 10/01/2020: Impressions: 1. Left ventricular ejection fraction, by estimation, is 60 to 65%. The  left ventricle has normal function. There is moderate concentric left  ventricular hypertrophy. Left ventricular diastolic function could not be  evaluated.   2. The mitral valve is grossly normal. Trivial mitral valve  regurgitation. No evidence of mitral stenosis.   3. The aortic valve is tricuspid. There is mild calcification of the  aortic valve. Aortic valve regurgitation is trivial. No aortic stenosis is  present.   4. Agitated saline contrast bubble study was negative, with no evidence  of any interatrial shunt.   Conclusion(s)/Recommendation(s): Limited study, no  major change from  recent study 09/23/20. Bubble study negative for intraatrial shunt.    Patient Profile     80 y.o. female with a history of COPD with prior tobacco use (quit in 2019) who was admitted on 09/22/2020 for acute hypoxic respiratory failure secondary to COPD exacerbation and multifocal pneumonia after presenting with shortness of breath, body aches, cough, and decreased appetite. Negative for COVID this admission. Cardiology consulted for elevated troponin. Did alter develop atrial fibrillation with RVR and was started on Cardizem and Eliquis with control of rates but not restoration of sinus rhythm Cardiology signed off on 09/26/2020 with plans to have patient follow-up in the A. Fib Clinc and likely DCCV after 304 weeks of anticoagulation. We were reconsulted today given recurrent RVR.  Assessment & Plan    New Onset Atrial Fibrillation with RVR - In the setting of acute hypoxic respiratory failure secondary to COPD exacerbation and pneumonia. Rates reasonably well controlled below 105 bpm at rest (increases to 110s to 120s with minimal activity/movement and while eating). - Electrolytes and TSH OK. - Echo as below. - Continue Cardizem CD 240mg  daily and  Lopressor 25mg  twice daily. Will hold off on up-titrating these due to soft BP. - Initially started on Eliquis. However, this was held due to hematuria. Hemoglobin 18.3.  Restart when able. - No plan for restoration of sinus rhythm at this time until there is substantial improvement in respiratory status. This can be discussed as an outpatient.   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  - Most recent chest x-ray on 10/04/2020 showed bilateral lower atelectasis or infiltrates slightly improved from prior study. - Echo showed LVEF of 59% but moderately enlarged RV with mildly reduced systolic function at the base and mid segments with relatively preserved apical systolic function. This can be seen in acute PE  but chest CTA negative for this. - Currently on IV Lasix 60mg  twice daily. Documented 2 L of urinary output yesterday. Net negative 18.5 L this admission. Weight down 5 lbs from yesterday and 8 lbs from admission. Renal function stable. - Will decrease IV Lasix to 40mg  twice daily. - RV dysfunction felt to be secondary to pulmonary hypertension. This is felt to be chronic but worsened in the setting of acute COPD exacerbation and pneumonia.  - Continue to monitor daily weights, strict I/Os, and renal function.   Elevated Troponin - High-sensitivity troponin mildly elevated and flat. Peaked at 128.  - EKG showed T wave abnormality in anterior leads. No prior tracing for comparison. - Echo as above. - Troponin elevation not felt to be ACS and more consistent with demand. She does have coronary calcification on CT so may benefit from outpatient Lexiscan once she recovers from acute illness.   Hypokalemia - Potassium 3.2 today. Supplemented by primary team. - Continue to monitor.   Otherwise, per primary team: - Acute hypoxic/hypercapnic respiratory failure requiring intermittent BiPAP - COPD Exacerbation - Multifocal pneumonia   - Abdominal pain - Transamnitis  - Hyponatremia - Hematuria  For questions or updates, please contact [facility] Please consult [email or web address] for contact info under        Signed, , PA-C  10/07/2020, 9:04 AM    Personally seen and examined. Agree with APP above with the following comments: Briefly 80 yo F with significant COPD with RV dysfunction Patient notes no change in the improvement in her SOB Exam notable for abdominal tenderness AF  RVR and bilaterally wheezes with increased WOB despite HFNC Personally reviewed relevant tests; AF rates are elevated with any activity (110s).  AXR shows distended small bowel suggestive of ileus Would recommend  - will continue CCB and BB as above - restart DOAC with primary team when able - patient is nearing euvolemia; we have decrease her lasix today - overall, little reserve; suspect significant COPD is driving this; worried about long term prognosis.  CRITICAL CARE Performed by: Crosley Stejskal A Kathlynn Swofford  Total critical care time: 30 minutes. Critical care time was exclusive of separately billable procedures and treating other patients. Critical care was necessary to treat or prevent imminent or life-threatening deterioration. Critical care was time spent personally by me on the following activities: development of treatment plan with patient and/or surrogate as well as nursing, discussions with consultants, evaluation of patient's response to treatment, examination of patient, obtaining history from patient or surrogate, ordering and performing treatments and interventions, ordering and review of laboratory studies, ordering and review of radiographic studies, pulse oximetry and re-evaluation of patient's condition.    Signed, Riley LamMahesh Gregoria Selvy, MD [place]  [facility]  10/07/2020 11:48 AM

## 2020-10-08 ENCOUNTER — Inpatient Hospital Stay (HOSPITAL_COMMUNITY): Payer: Medicare HMO

## 2020-10-08 ENCOUNTER — Encounter (HOSPITAL_COMMUNITY): Payer: Self-pay | Admitting: Internal Medicine

## 2020-10-08 DIAGNOSIS — J9621 Acute and chronic respiratory failure with hypoxia: Secondary | ICD-10-CM | POA: Diagnosis not present

## 2020-10-08 DIAGNOSIS — R0600 Dyspnea, unspecified: Secondary | ICD-10-CM | POA: Diagnosis not present

## 2020-10-08 DIAGNOSIS — I4891 Unspecified atrial fibrillation: Secondary | ICD-10-CM | POA: Diagnosis not present

## 2020-10-08 DIAGNOSIS — R7989 Other specified abnormal findings of blood chemistry: Secondary | ICD-10-CM | POA: Diagnosis not present

## 2020-10-08 LAB — CBC
HCT: 51.1 % — ABNORMAL HIGH (ref 36.0–46.0)
Hemoglobin: 17.7 g/dL — ABNORMAL HIGH (ref 12.0–15.0)
MCH: 31.7 pg (ref 26.0–34.0)
MCHC: 34.6 g/dL (ref 30.0–36.0)
MCV: 91.4 fL (ref 80.0–100.0)
Platelets: 311 10*3/uL (ref 150–400)
RBC: 5.59 MIL/uL — ABNORMAL HIGH (ref 3.87–5.11)
RDW: 13.6 % (ref 11.5–15.5)
WBC: 28.4 10*3/uL — ABNORMAL HIGH (ref 4.0–10.5)
nRBC: 0 % (ref 0.0–0.2)

## 2020-10-08 LAB — COMPREHENSIVE METABOLIC PANEL
ALT: 124 U/L — ABNORMAL HIGH (ref 0–44)
AST: 70 U/L — ABNORMAL HIGH (ref 15–41)
Albumin: 3 g/dL — ABNORMAL LOW (ref 3.5–5.0)
Alkaline Phosphatase: 69 U/L (ref 38–126)
Anion gap: 15 (ref 5–15)
BUN: 61 mg/dL — ABNORMAL HIGH (ref 8–23)
CO2: 33 mmol/L — ABNORMAL HIGH (ref 22–32)
Calcium: 8.7 mg/dL — ABNORMAL LOW (ref 8.9–10.3)
Chloride: 83 mmol/L — ABNORMAL LOW (ref 98–111)
Creatinine, Ser: 1.14 mg/dL — ABNORMAL HIGH (ref 0.44–1.00)
GFR, Estimated: 49 mL/min — ABNORMAL LOW (ref >60)
Glucose, Bld: 130 mg/dL — ABNORMAL HIGH (ref 70–99)
Potassium: 4.9 mmol/L (ref 3.5–5.1)
Sodium: 131 mmol/L — ABNORMAL LOW (ref 135–145)
Total Bilirubin: 1.6 mg/dL — ABNORMAL HIGH (ref 0.3–1.2)
Total Protein: 6.4 g/dL — ABNORMAL LOW (ref 6.5–8.1)

## 2020-10-08 LAB — GLUCOSE, CAPILLARY
Glucose-Capillary: 167 mg/dL — ABNORMAL HIGH (ref 70–99)
Glucose-Capillary: 105 mg/dL — ABNORMAL HIGH (ref 70–99)
Glucose-Capillary: 121 mg/dL — ABNORMAL HIGH (ref 70–99)
Glucose-Capillary: 125 mg/dL — ABNORMAL HIGH (ref 70–99)
Glucose-Capillary: 130 mg/dL — ABNORMAL HIGH (ref 70–99)

## 2020-10-08 MED ORDER — IOHEXOL 9 MG/ML PO SOLN
500.0000 mL | ORAL | Status: AC
  Administered 2020-10-08 (×2): 500 mL via ORAL

## 2020-10-08 MED ORDER — DIATRIZOATE MEGLUMINE & SODIUM 66-10 % PO SOLN
90.0000 mL | Freq: Once | ORAL | Status: AC
  Administered 2020-10-08: 90 mL
  Filled 2020-10-08: qty 90

## 2020-10-08 MED ORDER — SORBITOL 70 % SOLN
960.0000 mL | TOPICAL_OIL | Freq: Once | ORAL | Status: AC
  Administered 2020-10-08: 960 mL via RECTAL
  Filled 2020-10-08: qty 473

## 2020-10-08 MED ORDER — IOHEXOL 9 MG/ML PO SOLN
ORAL | Status: AC
  Filled 2020-10-08: qty 1000

## 2020-10-08 MED ORDER — IOHEXOL 350 MG/ML SOLN
80.0000 mL | Freq: Once | INTRAVENOUS | Status: AC | PRN
  Administered 2020-10-08: 80 mL via INTRAVENOUS

## 2020-10-08 NOTE — Progress Notes (Signed)
Second dose of contrast administered. Awaiting CT scan at 1115.

## 2020-10-08 NOTE — Progress Notes (Signed)
NG tube suction clamped this time d/t patient receiving contrast and will go for CT . No signs of distress. Will monitor. First dose of CT contrast done.

## 2020-10-08 NOTE — Progress Notes (Signed)
Progress Note     Subjective: Patient reports she is still having some abdominal pain. Unsure whether she is passing flatus and unable to recall much about BMs overnight.   Objective: Vital signs in last 24 hours: Temp:  [97.7 F (36.5 C)-98.8 F (37.1 C)] 98 F (36.7 C) (09/07 0800) Pulse Rate:  [50-142] 77 (09/07 0847) Resp:  [20-31] 22 (09/07 0800) BP: (98-143)/(71-108) 98/72 (09/07 0800) SpO2:  [87 %-96 %] 93 % (09/07 0800) FiO2 (%):  [50 %] 50 % (09/07 0800) Weight:  [84.5 kg] 84.5 kg (09/07 0500) Last BM Date: 10/08/20  Intake/Output from previous day: 09/06 0701 - 09/07 0700 In: 500.6 [IV Piggyback:500.6] Out: 1303 [[facility]:9470] Intake/Output this shift: Total I/O In: -  Out: 600 [Emesis/NG output:600]  PE: General: pleasant, WD, chronically ill appearing female who is laying in bed in NAD Heart: irregularly irregular and tachycardic Lungs: rales in bilateral bases.  on HFNC Abd: soft, mild ttp in RLQ without peritonitis, mild distention, BS hypoactive, NGT with bloody appearing drainage    Lab Results:  Recent Labs    10/06/20 0251 10/08/20 0302  WBC 18.1* 28.4*  HGB 16.7* 17.7*  HCT 48.3* 51.1*  PLT 209 311   BMET Recent Labs    10/07/20 0253 10/08/20 0302  NA 128* 131*  K 3.2* 4.9  CL 80* 83*  CO2 33* 33*  GLUCOSE 149* 130*  BUN 44* 61*  CREATININE 0.78 1.14*  CALCIUM 8.6* 8.7*   PT/INR No results for input(s): LABPROT, INR in the last 72 hours. CMP     Component Value Date/Time   NA 131 (L) 10/08/2020 0302   K 4.9 10/08/2020 0302   CL 83 (L) 10/08/2020 0302   CO2 33 (H) 10/08/2020 0302   GLUCOSE 130 (H) 10/08/2020 0302   BUN 61 (H) 10/08/2020 0302   CREATININE 1.14 (H) 10/08/2020 0302   CALCIUM 8.7 (L) 10/08/2020 0302   PROT 6.4 (L) 10/08/2020 0302   ALBUMIN 3.0 (L) 10/08/2020 0302   AST 70 (H) 10/08/2020 0302   ALT 124 (H) 10/08/2020 0302   ALKPHOS 69 10/08/2020 0302   BILITOT 1.6 (H) 10/08/2020 0302   GFRNONAA 49 (L)  10/08/2020 0302   GFRAA  09/16/2009 1757    >60        The eGFR has been calculated using the MDRD equation. This calculation has not been validated in all clinical situations. eGFR's persistently <60 mL/min signify possible Chronic Kidney Disease.   Lipase     Component Value Date/Time   LIPASE 54 (H) 09/30/2020 0250       Studies/Results: DG Abd Portable 1V  Result Date: 10/07/2020 CLINICAL DATA:  NG tube placement EXAM: PORTABLE ABDOMEN - 1 VIEW COMPARISON:  10/07/2020 FINDINGS: Patchy airspace disease at the bases. Esophageal tube tip overlies the distal stomach. Persistent gaseous dilatation large and small bowel. IMPRESSION: Esophageal tube tip overlies distal stomach. Persistent bowel distention Electronically Signed   By: Donavan Foil M.D.   On: 10/07/2020 19:36   DG Abd Portable 1V  Result Date: 10/07/2020 CLINICAL DATA:  Worsening abdominal pain and distension this morning. EXAM: PORTABLE ABDOMEN - 1 VIEW COMPARISON:  09/30/2020; CT abdomen pelvis-09/30/2020 FINDINGS: Moderate to marked gaseous distension of multiple loops of distal small bowel as well as the cecum, ascending and transverse colon, progressed compared to the 09/30/2020 examination. Moderate colonic stool burden. Nondiagnostic evaluation for pneumoperitoneum secondary to supine positioning and exclusion of the lower thorax. No pneumatosis or portal venous gas.  No definitive evidence of volvulus. Post cholecystectomy. No definitive abnormal intra-abdominal calcifications. Degenerative change of the lower lumbar spine is suspected though incompletely evaluated. IMPRESSION: 1. Worsening gaseous distension of the distal small bowel and colon nonspecific though presumably secondary to ileus. Continued attention on follow-up is advised. 2. Moderate colonic stool burden. Electronically Signed   By: Sandi Mariscal M.D.   On: 10/07/2020 08:22   DG Loyce Dys Tube Plc W/Fl W/Rad  Result Date: 10/07/2020 CLINICAL DATA:  Small  bowel obstruction. EXAM: NASO G TUBE PLACEMENT WITH FL AND WITH RAD FLUOROSCOPY TIME:  Fluoroscopy Time: < 1 second Radiation Exposure Index (if provided by the fluoroscopic device): 0.4 mGy Number of Acquired Spot Images: None COMPARISON:  Abdominal radiograph 10/07/2020. CT abdomen/pelvis 09/30/2020. FINDINGS: Multiple attempts at nasogastric tube placement via both the right and left nares were unsuccessful. The NG tube could not be advanced beyond the nasal cavities/nasopharynx. IMPRESSION: Unsuccessful fluoroscopically-guided NG tube placement, as described. Electronically Signed   By: Kellie Simmering D.O.   On: 10/07/2020 16:36    Anti-infectives: Anti-infectives (From admission, onward)    Start     Dose/Rate Route Frequency Ordered Stop   10/08/20 1000  fluconazole (DIFLUCAN) tablet 200 mg        200 mg Oral Daily 10/07/20 1426 10/11/20 0959   10/03/20 1700  fluconazole (DIFLUCAN) tablet 200 mg  Status:  Discontinued        200 mg Oral Daily 10/03/20 1606 10/07/20 1426   09/23/20 1800  cefTRIAXone (ROCEPHIN) 2 g in sodium chloride 0.9 % 100 mL IVPB        2 g 200 mL/hr over 30 Minutes Intravenous Every 24 hours 09/22/20 1842 09/29/20 1749   09/23/20 1800  azithromycin (ZITHROMAX) 500 mg in sodium chloride 0.9 % 250 mL IVPB  Status:  Discontinued        500 mg 250 mL/hr over 60 Minutes Intravenous Every 24 hours 09/22/20 1842 09/25/20 1456   09/22/20 1630  cefTRIAXone (ROCEPHIN) 1 g in sodium chloride 0.9 % 100 mL IVPB        1 g 200 mL/hr over 30 Minutes Intravenous  Once 09/22/20 1618 09/22/20 1806   09/22/20 1630  azithromycin (ZITHROMAX) 500 mg in sodium chloride 0.9 % 250 mL IVPB        500 mg 250 mL/hr over 60 Minutes Intravenous  Once 09/22/20 1618 09/22/20 1939        Assessment/Plan Ileus vs constipation - NGT placed yesterday - output not recorded overnight - BM recorded for overnight - x2 and small  - recommend repeat CT AP with PO and IV contrast - pending results of  CT may need enemas to clear out stool burden noted on film from earlier today - recommend keeping K>4.0 and Mg > 2.0 to optimize bowel function - mobilize as able    FEN: NPO, IVF per TRH, NGT to LIWS VTE: SCDs ID:[id number] diflucan 9/2>>   COPD with PNA AKI A. Fib with RVR UTI RML pulmonary nodule  LOS: 16 days    Norm Parcel, [facility] 10/08/2020, 10:43 AM Please see Amion for pager number during day hours 7:00am-4:30pm

## 2020-10-08 NOTE — Progress Notes (Signed)
Progress Note    Kimberly Ballard  VHQ:[id number] DOB: May 29, 1940  DOA: 09/22/2020 PCP: System, Provider Not In    Brief Narrative:     Medical records reviewed and are as summarized below:  Kimberly Ballard is an 80 y.o. female with a history of COPD presented with shortness of breath.  She was found to be hypoxemic requiring 6 L of oxygen.  CTA chest ruled out pulmonary embolism but showed bilateral infiltrates.  Patient started on Rocephin and Zithromax for CAP.  Also went into A. fib with RVR required BiPAP.  Cardiology and pulmonology were consulted.  She was started on Cardizem drip and heparin GTT.  She has been transitioned to p.o. Cardizem.  Diuresed well with IV Lasix.  Patient is DNR.  She also developed hematuria.  Started on CBI per urology.  Urine culture grew Candida, started on Diflucan.  Assessment/Plan:   Active Problems:   CAP (community acquired pneumonia)   Acute respiratory failure with hypoxia and hypercapnia (HCC)   Sepsis (HCC)   Elevated LFTs   Cardiomegaly   Elevated troponin   Atrial fibrillation with RVR (HCC)   Acute and chronic respiratory failure (acute-on-chronic) (HCC)   Dyspnea   Acute hypoxemic/hypercapnic respiratory failure requiring BiPAP -Multifactorial from multifocal pneumonia, diastolic CHF -CTA chest ruled out pulmonary embolism, showed multifocal pneumonia -Completed antibiotics ,ceftriaxone Zithromax for 5 days -Continues on HFNC 20 L/min -BiPAP as needed -There was concern for COVID-pneumonia however patient was COVID-negative x2 -Continue Brovana/Pulmicort/Yupelri -Continue as needed Xopenex   Ileus -KUB shows moderate stool burden - multiple dilated small bowel loops -Patient vomited her medications this morning -NG Tube -GS consult: await CT scan   Atrial fibrillation with RVR -Continue Cardizem, metoprolol -Eliquis was on hold due to hematuria - plan to resume if no need for surgery   Acute diastolic CHF -As per echo  EF 55 to 60% -Cardiology consulted   Acute kidney injury -Resolved   Elevated troponin -Likely demand ischemia -Cardiology following   Right middle lobe nodule -Repeat CT chest in 3 months   Hematuria -Urology consulted, started on CBI -resolved  Hyponatremia -mild, trend   Yeast in urine -Started on Diflucan for 7 days   Hypokalemia -Replaced  obesity Body mass index is 33 kg/m.   Family Communication/Anticipated D/C date and plan/Code Status   DVT prophylaxis: eliquis on hold Code Status: DNR Disposition Plan: Status is: Inpatient  Remains inpatient appropriate because:Inpatient level of care appropriate due to severity of illness  Dispo: The patient is from: Home              Anticipated d/c is to:  tbd              Patient currently is not medically stable to d/c.   Difficult to place patient No   Overall poor prognosis--  if continues to not progress, may need palliative care consult and consideration of hospice       Medical Consultants:   Urology Critical care cardiology     Subjective:   Breathing not better  Objective:    Vitals:   10/08/20 0500 10/08/20 0600 10/08/20 0734 10/08/20 0750  BP:      Pulse:      Resp:      Temp:  98.8 F (37.1 C)    TempSrc:  Oral    SpO2:   93% 94%  Weight: 84.5 kg     Height:  Intake/Output Summary (Last 24 hours) at 10/08/2020 0803 Last data filed at 10/08/2020 0755 Gross per 24 hour  Intake 500.61 ml  Output 1903 ml  Net -1402.39 ml   Filed Weights   10/06/20 0500 10/07/20 0500 10/08/20 0500  Weight: 89.5 kg 87.3 kg 84.5 kg    Exam:  General: Appearance:    Obese female who appear uncomfortable, NG tube in place     Lungs:     On [place], diminished  Heart:    Normal heart rate. irregular  MS:   All extremities are intact.    Neurologic:   Will awaken     Data Reviewed:   I have personally reviewed following labs and imaging studies:  Labs: Labs show the following:    Basic Metabolic Panel: Recent Labs  Lab 10/04/20 0230 10/05/20 0304 10/06/20 0251 10/07/20 0253 10/08/20 0302  NA 131* 132* 132* 128* 131*  K 3.6 3.5 3.4* 3.2* 4.9  CL 76* 80* 79* 80* 83*  CO2 40* 43* 41* 33* 33*  GLUCOSE 123* 117* 115* 149* 130*  BUN 45* 40* 36* 44* 61*  CREATININE 0.69 0.60 0.66 0.78 1.14*  CALCIUM 8.4* 8.4* 8.4* 8.6* 8.7*  MG  --  2.2  --   --   --   PHOS  --  3.5  --   --   --    GFR Estimated Creatinine Clearance: 41.2 mL/min (A) (by C-G formula based on SCr of 1.14 mg/dL (H)). Liver Function Tests: Recent Labs  Lab 10/05/20 0304 10/08/20 0302  AST 39 70*  ALT 83* 124*  ALKPHOS 59 69  BILITOT 2.0* 1.6*  PROT 5.7* 6.4*  ALBUMIN 2.7* 3.0*   No results for input(s): LIPASE, AMYLASE in the last 168 hours. No results for input(s): AMMONIA in the last 168 hours. Coagulation profile No results for input(s): INR, PROTIME in the last 168 hours.  CBC: Recent Labs  Lab 10/03/20 1532 10/04/20 0230 10/05/20 0304 10/06/20 0251 10/08/20 0302  WBC  --  25.1* 21.4* 18.1* 28.4*  HGB 17.9* 17.5* 18.3* 16.7* 17.7*  HCT 51.9* 50.6* 53.1* 48.3* 51.1*  MCV  --  92.0 92.7 91.5 91.4  PLT  --  247 222 209 311   Cardiac Enzymes: No results for input(s): CKTOTAL, CKMB, CKMBINDEX, TROPONINI in the last 168 hours. BNP (last 3 results) No results for input(s): PROBNP in the last 8760 hours. CBG: Recent Labs  Lab 10/07/20 0737 10/07/20 1219 10/07/20 1742 10/07/20 2126 10/08/20 0742  GLUCAP 196* 191* 146* 151* 167*   D-Dimer: No results for input(s): DDIMER in the last 72 hours. Hgb A1c: No results for input(s): HGBA1C in the last 72 hours. Lipid Profile: No results for input(s): CHOL, HDL, LDLCALC, TRIG, CHOLHDL, LDLDIRECT in the last 72 hours. Thyroid function studies: No results for input(s): TSH, T4TOTAL, T3FREE, THYROIDAB in the last 72 hours.  Invalid input(s): FREET3 Anemia work up: No results for input(s): VITAMINB12, FOLATE, FERRITIN,  TIBC, IRON, RETICCTPCT in the last 72 hours. Sepsis Labs: Recent Labs  Lab 10/04/20 0230 10/05/20 0304 10/06/20 0251 10/08/20 0302  WBC 25.1* 21.4* 18.1* 28.4*    Microbiology Recent Results (from the past 240 hour(s))  Urine Culture     Status: Abnormal   Collection Time: 10/01/20 10:13 AM   Specimen: Urine, Catheterized  Result Value Ref Range Status   Specimen Description   Final    URINE, CATHETERIZED Performed at [facility], [address], [place], [place] [address]  Special Requests   Final    NONE Performed at [facility], [address] [place], [place] [address]    Culture 40,000 COLONIES/mL YEAST (A)  Final   Report Status 10/02/2020 FINAL  Final    Procedures and diagnostic studies:  DG Abd Portable 1V  Result Date: 10/07/2020 CLINICAL DATA:  NG tube placement EXAM: PORTABLE ABDOMEN - 1 VIEW COMPARISON:  10/07/2020 FINDINGS: Patchy airspace disease at the bases. Esophageal tube tip overlies the distal stomach. Persistent gaseous dilatation large and small bowel. IMPRESSION: Esophageal tube tip overlies distal stomach. Persistent bowel distention Electronically Signed   By: Jasmine Pang M.D.   On: 10/07/2020 19:36   DG Abd Portable 1V  Result Date: 10/07/2020 CLINICAL DATA:  Worsening abdominal pain and distension this morning. EXAM: PORTABLE ABDOMEN - 1 VIEW COMPARISON:  09/30/2020; CT abdomen pelvis-09/30/2020 FINDINGS: Moderate to marked gaseous distension of multiple loops of distal small bowel as well as the cecum, ascending and transverse colon, progressed compared to the 09/30/2020 examination. Moderate colonic stool burden. Nondiagnostic evaluation for pneumoperitoneum secondary to supine positioning and exclusion of the lower thorax. No pneumatosis or portal venous gas. No definitive evidence of volvulus. Post cholecystectomy. No definitive abnormal intra-abdominal calcifications. Degenerative change of the lower  lumbar spine is suspected though incompletely evaluated. IMPRESSION: 1. Worsening gaseous distension of the distal small bowel and colon nonspecific though presumably secondary to ileus. Continued attention on follow-up is advised. 2. Moderate colonic stool burden. Electronically Signed   By: Simonne Come M.D.   On: 10/07/2020 08:22   DG Basil Dess Tube Plc W/Fl W/Rad  Result Date: 10/07/2020 CLINICAL DATA:  Small bowel obstruction. EXAM: NASO G TUBE PLACEMENT WITH FL AND WITH RAD FLUOROSCOPY TIME:  Fluoroscopy Time: < 1 second Radiation Exposure Index (if provided by the fluoroscopic device): 0.4 mGy Number of Acquired Spot Images: None COMPARISON:  Abdominal radiograph 10/07/2020. CT abdomen/pelvis 09/30/2020. FINDINGS: Multiple attempts at nasogastric tube placement via both the right and left nares were unsuccessful. The NG tube could not be advanced beyond the nasal cavities/nasopharynx. IMPRESSION: Unsuccessful fluoroscopically-guided NG tube placement, as described. Electronically Signed   By: Jackey Loge D.O.   On: 10/07/2020 16:36    Medications:    arformoterol  15 mcg Nebulization BID   budesonide (PULMICORT) nebulizer solution  0.5 mg Nebulization BID   chlorhexidine  15 mL Mouth Rinse BID   Chlorhexidine Gluconate Cloth  6 each Topical Daily   diltiazem  240 mg Oral Daily   fluconazole  200 mg Oral Daily   furosemide  40 mg Intravenous BID   Gerhardt's butt cream   Topical TID   guaiFENesin  600 mg Oral BID   insulin aspart  0-9 Units Subcutaneous TID WC   mouth rinse  15 mL Mouth Rinse q12n4p   metoprolol tartrate  25 mg Oral BID   pantoprazole (PROTONIX) IV  40 mg Intravenous Q12H   polyethylene glycol  17 g Oral BID   revefenacin  175 mcg Nebulization Daily   Continuous Infusions:  sodium chloride Stopped (09/29/20 1824)     LOS: 16 days   Joseph Art  [facility]   How to contact the [facility] Attending or Consulting provider 7A - 7P or covering provider during after  hours 7P -7A, for this patient?  Check the care team in [facility] and look for a) attending/consulting [facility] provider listed and b) the [facility] team listed Log into [email or web address] and use [employer] universal password  to access. If you do not have the password, please contact the hospital operator. Locate the [facility] provider you are looking for under [facility] and page to a number that you can be directly reached. If you still have difficulty reaching the provider, please page the [facility] (Director on Call) for the Hospitalists listed on amion for assistance.  10/08/2020, 8:03 AM

## 2020-10-08 NOTE — Progress Notes (Signed)
Pt does not wish to utilize BIPAP QHS due to having an NG tube in place.  Pt currently on HHFNC and tolerating well.

## 2020-10-08 NOTE — Progress Notes (Addendum)
Progress Note  Patient Name: Kimberly Ballard Date of Encounter: 10/08/2020  [facility] Cardiologist: Armanda Magicraci Turner, MD   Subjective   No acute overnight events. Little progress. Patient now also has a NG tube for ileus. Her heart rates are more uncontrolled today with resting rates in the 120s. She does not feel like her breathing has improved much since admission. No chest pain. No palpitations. No abdominal pain.  Inpatient Medications    Scheduled Meds:  arformoterol  15 mcg Nebulization BID   budesonide (PULMICORT) nebulizer solution  0.5 mg Nebulization BID   chlorhexidine  15 mL Mouth Rinse BID   Chlorhexidine Gluconate Cloth  6 each Topical Daily   diltiazem  240 mg Oral Daily   fluconazole  200 mg Oral Daily   furosemide  40 mg Intravenous BID   Gerhardt's butt cream   Topical TID   guaiFENesin  600 mg Oral BID   insulin aspart  0-9 Units Subcutaneous TID WC   mouth rinse  15 mL Mouth Rinse q12n4p   metoprolol tartrate  25 mg Oral BID   pantoprazole (PROTONIX) IV  40 mg Intravenous Q12H   polyethylene glycol  17 g Oral BID   revefenacin  175 mcg Nebulization Daily   Continuous Infusions:  sodium chloride Stopped (09/29/20 1824)   PRN Meds: sodium chloride, levalbuterol, metoprolol tartrate, oxybutynin, simethicone, sodium chloride   Vital Signs    Vitals:   10/08/20 0300 10/08/20 0400 10/08/20 0500 10/08/20 0600  BP: (!) 143/108 (!) 110/91    Pulse: (!) 56 86    Resp: (!) 23 (!) 22    Temp:    98.8 F (37.1 C)  TempSrc:    Oral  SpO2: 93% 94%    Weight:   84.5 kg   Height:        Intake/Output Summary (Last 24 hours) at 10/08/2020 0719 Last data filed at 10/08/2020 0600 Gross per 24 hour  Intake 500.61 ml  Output 1303 ml  Net -802.39 ml   Last 3 Weights 10/08/2020 10/07/2020 10/06/2020  Weight (lbs) 186 lb 4.6 oz 192 lb 7.4 oz 197 lb 5 oz  Weight (kg) 84.5 kg 87.3 kg 89.5 kg      Telemetry    Atrial fibrillation with resting rates mostly in the 120s  but as high as the 130s to 140s. - Personally Reviewed  ECG    No new ECG tracing today. - Personally Reviewed  Physical Exam   GEN: No acute distress.   Neck: No JVD. Cardiac: Tachycardic with irregularly irregular rhythm. No murmurs, rubs, or gallops.  Respiratory: Mild increased work of breathing on HFNC. Mild crackles noted in bases, worse on the right (wonder if this is more atelectasis). A few scattered wheezes. GI: Soft, distended, but non-tender. NG tube in place. MS: Trace bilateral lower extremity edema. No deformity. Skin: Warm and dry. Neuro:  No focal deficits. Psych: Normal affect. Responds appropriately.  Labs    High Sensitivity Troponin:   Recent Labs  Lab 09/22/20 2148 09/23/20 0207 09/24/20 0239 10/04/20 1721 10/04/20 1923  TROPONINIHS 128* 100* 52* 59* 67*      Chemistry Recent Labs  Lab 10/05/20 0304 10/06/20 0251 10/07/20 0253 10/08/20 0302  NA 132* 132* 128* 131*  K 3.5 3.4* 3.2* 4.9  CL 80* 79* 80* 83*  CO2 43* 41* 33* 33*  GLUCOSE 117* 115* 149* 130*  BUN 40* 36* 44* 61*  CREATININE 0.60 0.66 0.78 1.14*  CALCIUM 8.4* 8.4*  8.6* 8.7*  PROT 5.7*  --   --  6.4*  ALBUMIN 2.7*  --   --  3.0*  AST 39  --   --  70*  ALT 83*  --   --  124*  ALKPHOS 59  --   --  69  BILITOT 2.0*  --   --  1.6*  GFRNONAA >60 >60 >60 49*  ANIONGAP 9 12 15 15      Hematology Recent Labs  Lab 10/05/20 0304 10/06/20 0251 10/08/20 0302  WBC 21.4* 18.1* 28.4*  RBC 5.73* 5.28* 5.59*  HGB 18.3* 16.7* 17.7*  HCT 53.1* 48.3* 51.1*  MCV 92.7 91.5 91.4  MCH 31.9 31.6 31.7  MCHC 34.5 34.6 34.6  RDW 13.2 13.1 13.6  PLT 222 209 311    BNP Recent Labs  Lab 10/03/20 0255  BNP 430.5*     DDimer No results for input(s): DDIMER in the last 168 hours.   Radiology    DG Abd Portable 1V  Result Date: 10/07/2020 CLINICAL DATA:  NG tube placement EXAM: PORTABLE ABDOMEN - 1 VIEW COMPARISON:  10/07/2020 FINDINGS: Patchy airspace disease at the bases. Esophageal  tube tip overlies the distal stomach. Persistent gaseous dilatation large and small bowel. IMPRESSION: Esophageal tube tip overlies distal stomach. Persistent bowel distention Electronically Signed   By: 12/07/2020 M.D.   On: 10/07/2020 19:36   DG Abd Portable 1V  Result Date: 10/07/2020 CLINICAL DATA:  Worsening abdominal pain and distension this morning. EXAM: PORTABLE ABDOMEN - 1 VIEW COMPARISON:  09/30/2020; CT abdomen pelvis-09/30/2020 FINDINGS: Moderate to marked gaseous distension of multiple loops of distal small bowel as well as the cecum, ascending and transverse colon, progressed compared to the 09/30/2020 examination. Moderate colonic stool burden. Nondiagnostic evaluation for pneumoperitoneum secondary to supine positioning and exclusion of the lower thorax. No pneumatosis or portal venous gas. No definitive evidence of volvulus. Post cholecystectomy. No definitive abnormal intra-abdominal calcifications. Degenerative change of the lower lumbar spine is suspected though incompletely evaluated. IMPRESSION: 1. Worsening gaseous distension of the distal small bowel and colon nonspecific though presumably secondary to ileus. Continued attention on follow-up is advised. 2. Moderate colonic stool burden. Electronically Signed   By: 10/02/2020 M.D.   On: 10/07/2020 08:22   DG 12/07/2020 Tube Plc W/Fl W/Rad  Result Date: 10/07/2020 CLINICAL DATA:  Small bowel obstruction. EXAM: NASO G TUBE PLACEMENT WITH FL AND WITH RAD FLUOROSCOPY TIME:  Fluoroscopy Time: < 1 second Radiation Exposure Index (if provided by the fluoroscopic device): 0.4 mGy Number of Acquired Spot Images: None COMPARISON:  Abdominal radiograph 10/07/2020. CT abdomen/pelvis 09/30/2020. FINDINGS: Multiple attempts at nasogastric tube placement via both the right and left nares were unsuccessful. The NG tube could not be advanced beyond the nasal cavities/nasopharynx. IMPRESSION: Unsuccessful fluoroscopically-guided NG tube placement, as  described. Electronically Signed   By: 10/02/2020 D.O.   On: 10/07/2020 16:36    Cardiac Studies   Complete Echo 09/23/2020: Impressions:  1. Left ventricular ejection fraction, by estimation, is 55 to 60%. Left  ventricular ejection fraction by 3D volume is 59 %. The left ventricle has  normal function. The left ventricle has no regional wall motion  abnormalities. There is moderate left  ventricular hypertrophy. Left ventricular diastolic parameters are  consistent with Grade I diastolic dysfunction (impaired relaxation).   2. Right ventricular systolic function mildly reduced at base and mid,  with relative preserved apical systolic function. The right ventricular  size is moderately enlarged.  There is moderately elevated pulmonary artery  systolic pressure. The estimated  right ventricular systolic pressure is 48.4 mmHg.   3. Left atrial size was mildly dilated.   4. Right atrial size was moderately dilated.   5. The mitral valve is normal in structure. Trivial mitral valve  regurgitation. No evidence of mitral stenosis.   6. Tricuspid valve regurgitation is mild to moderate.   7. The aortic valve is normal in structure. Aortic valve regurgitation is  not visualized. No aortic stenosis is present.   8. The inferior vena cava is dilated in size with <50% respiratory  variability, suggesting right atrial pressure of 15 mmHg.  _______________   Limited Echo with Bubble Study 10/01/2020: Impressions: 1. Left ventricular ejection fraction, by estimation, is 60 to 65%. The  left ventricle has normal function. There is moderate concentric left  ventricular hypertrophy. Left ventricular diastolic function could not be  evaluated.   2. The mitral valve is grossly normal. Trivial mitral valve  regurgitation. No evidence of mitral stenosis.   3. The aortic valve is tricuspid. There is mild calcification of the  aortic valve. Aortic valve regurgitation is trivial. No aortic stenosis is   present.   4. Agitated saline contrast bubble study was negative, with no evidence  of any interatrial shunt.   Conclusion(s)/Recommendation(s): Limited study, no major change from  recent study 09/23/20. Bubble study negative for intraatrial shunt.   Patient Profile     80 y.o. female with a history of COPD with prior tobacco use (quit in 2019) who was admitted on 09/22/2020 for acute hypoxic respiratory failure secondary to COPD exacerbation and multifocal pneumonia after presenting with shortness of breath, body aches, cough, and decreased appetite. Negative for COVID this admission. Cardiology consulted for elevated troponin. Did alter develop atrial fibrillation with RVR and was started on Cardizem and Eliquis with control of rates but not restoration of sinus rhythm Cardiology signed off on 09/26/2020 with plans to have patient follow-up in the A. Fib Clinc and likely DCCV after 304 weeks of anticoagulation. We were reconsulted on 10/01/2020 given recurrent RVR.  Assessment & Plan    New Onset Atrial Fibrillation with RVR - In the setting of acute hypoxic respiratory failure secondary to COPD exacerbation and pneumonia. Rates more uncontrolled today. Resting rates mostly in the 120s but up to 130s to 140s at times. - Electrolytes and TSH OK. - Echo as below. - Continue Cardizem CD 240mg  daily and Lopressor 25mg  twice daily. Will hold off on up-titrating these due to soft BP and discuss with MD. Given COPD, would like to avoid Amiodarone if possible. - Initially started on Eliquis. However, this was held due to hematuria. Hemoglobin 17.7.  Restart when able. Discussed this with Pharmacy who stated they would check with primary team. - No plan for restoration of sinus rhythm at this time until there is substantial improvement in respiratory status. This can be discussed as an outpatient.    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   - Most recent chest x-ray on 10/04/2020 showed bilateral lower atelectasis or infiltrates slightly improved from prior study. - Echo showed LVEF of 59% but moderately enlarged RV with mildly reduced systolic function at the base and mid segments with relatively preserved apical systolic function. This can be seen in acute PE  but chest CTA negative for this. - IV Lasix decreased to 40mg  twice daily yesterday. Documented 1.3 L of urinary  output yesterday. Net negative 19.3 L this admission. Weight 186 lbs today, down 5 lbs from yesterday and 14 lbs from admission. Dry weight thought to be around 190 lbs. Creatinine starting to rise now - up from 0.78 yesterday to 1.14 today. - Given soft BP, rise in creatinine, and the fact that it looks like we have reached dry weight, will stop - RV dysfunction felt to be secondary to pulmonary hypertension. This is felt to be chronic but worsened in the setting of acute COPD exacerbation and pneumonia.  - Continue to monitor daily weights, strict I/Os, and renal function.   Elevated Troponin - High-sensitivity troponin mildly elevated and flat. Peaked at 128.  - EKG showed T wave abnormality in anterior leads. No prior tracing for comparison. - Echo as above. - Troponin elevation not felt to be ACS and more consistent with demand. She does have coronary calcification on CT so may benefit from outpatient Lexiscan once she recovers from acute illness.    Hypokalemia - Potassium 3.2 yesterday. Supplemented and 4.9 today. - Continue to monitor.   Otherwise, per primary team: - Acute hypoxic/hypercapnic respiratory failure requiring intermittent BiPAP - COPD Exacerbation - Multifocal pneumonia  - Abdominal pain - Transamnitis  - Hyponatremia - Hematuria: Started on CBI. Urology consulted. - Ileus: S/p NGT placement. General Surgery consulted.  For questions or updates, please contact [facility] Please consult [email or web address] for contact info under         Signed, Corrin Parker, PA-C  10/08/2020, 7:19 AM    ADDENDUM: Discussed with Dr. Izora Ribas. He agrees with stopping Lasix. Hopefully this will allow BP to improve some so that we can increase Cardizem. Looks like dose of Cardizem held this morning due to BP and the fact that patient has NG tube and unable to crush medication. Although she did take other PO medications this morning.   Corrin Parker, PA-C 10/08/2020 10:25 AM  Personally seen and examined. Agree with APP above with the following comments: Briefly 80 yo F with significant COPD Cor Pulmonale who presents with COPD exacerbation, RV failure, AF RVR and Ileus Patient notes no change in her breathing or in her abdominal pain.   Exam notable for A fib RVR bilateral wheezes and abdominal tenderness. Labs notable for worsening transaminitis.  Hgb was decreasing at 10/06/20 check but at 16.  (Stopped for hematuria) Personally reviewed relevant tests; Tele shows AF RVR rates 120s Would recommend  - if able to tolerate PO would increase Cardizem and continue low dose metoprolol.  If unable to tolerate PO meds would switch to cardizem drip - if not further concerns over bleeding diathesis would restart eliquis - We have stopped IV lasix - has had worsening transaminitis, has been off AC for ~ 48 hours and has significant COPD:  Not an ideal candidate for amiodarone   Riley Lam, MD Cardiologist [facility]  [address] [place], [address] [place], [place] [address] [phone]  12:21 PM

## 2020-10-08 NOTE — TOC Progression Note (Signed)
Transition of Care [facility]) - Progression Note   Patient Details  Name: Kimberly Ballard MRN: [id number] Date of Birth: Nov 29, 1940  Transition of Care [facility]) CM/SW Contact  Ewing Schlein, LCSW Phone Number: 10/08/2020, 1:31 PM  Clinical Narrative: PT and OT evaluations continue to recommend SNF. CSW spoke with patient' son, Kimberly Ballard, to discuss therapies' recommendations. Per son, the patient and family would prefer for her to discharge home at the end of the hospitalization rather than discharging to short-term rehab. Although patient lives alone, patient's son and DIL live two houses away and can provide assistance as needed. Patient and family will consider SNF, but it is not their preference at this time. TOC to follow.  Expected Discharge Plan: Home/Self Care Barriers to Discharge: Continued Medical Work up  Expected Discharge Plan and Services Expected Discharge Plan: Home/Self Care Discharge Planning Services: CM Consult Living arrangements for the past 2 months: Single Family Home  Readmission Risk Interventions No flowsheet data found.

## 2020-10-08 NOTE — Progress Notes (Signed)
PT Cancellation Note  Patient Details Name: Kimberly Ballard MRN: [id number] DOB: January 11, 1941   Cancelled Treatment:     Patient with nursing. Will attempt mobility  tomorrow.   [name] 10/08/2020, 3:14 PM Blanchard Kelch PT Acute Rehabilitation Services Pager [phone] Office [phone]

## 2020-10-09 ENCOUNTER — Inpatient Hospital Stay (HOSPITAL_COMMUNITY): Payer: Medicare HMO

## 2020-10-09 ENCOUNTER — Ambulatory Visit (HOSPITAL_COMMUNITY): Payer: Medicare HMO | Admitting: Physician Assistant

## 2020-10-09 DIAGNOSIS — K5900 Constipation, unspecified: Secondary | ICD-10-CM

## 2020-10-09 DIAGNOSIS — K567 Ileus, unspecified: Secondary | ICD-10-CM | POA: Diagnosis not present

## 2020-10-09 DIAGNOSIS — I4891 Unspecified atrial fibrillation: Secondary | ICD-10-CM | POA: Diagnosis not present

## 2020-10-09 DIAGNOSIS — J9621 Acute and chronic respiratory failure with hypoxia: Secondary | ICD-10-CM | POA: Diagnosis not present

## 2020-10-09 DIAGNOSIS — R579 Shock, unspecified: Secondary | ICD-10-CM | POA: Diagnosis not present

## 2020-10-09 LAB — CBC
HCT: 52 % — ABNORMAL HIGH (ref 36.0–46.0)
Hemoglobin: 17.9 g/dL — ABNORMAL HIGH (ref 12.0–15.0)
MCH: 32 pg (ref 26.0–34.0)
MCHC: 34.4 g/dL (ref 30.0–36.0)
MCV: 92.9 fL (ref 80.0–100.0)
Platelets: 329 10*3/uL (ref 150–400)
RBC: 5.6 MIL/uL — ABNORMAL HIGH (ref 3.87–5.11)
RDW: 13.8 % (ref 11.5–15.5)
WBC: 31.6 10*3/uL — ABNORMAL HIGH (ref 4.0–10.5)
nRBC: 0 % (ref 0.0–0.2)

## 2020-10-09 LAB — GLUCOSE, CAPILLARY
Glucose-Capillary: 139 mg/dL — ABNORMAL HIGH (ref 70–99)
Glucose-Capillary: 151 mg/dL — ABNORMAL HIGH (ref 70–99)
Glucose-Capillary: 125 mg/dL — ABNORMAL HIGH (ref 70–99)
Glucose-Capillary: 195 mg/dL — ABNORMAL HIGH (ref 70–99)
Glucose-Capillary: 167 mg/dL — ABNORMAL HIGH (ref 70–99)

## 2020-10-09 LAB — COMPREHENSIVE METABOLIC PANEL
ALT: 95 U/L — ABNORMAL HIGH (ref 0–44)
AST: 47 U/L — ABNORMAL HIGH (ref 15–41)
Albumin: 2.9 g/dL — ABNORMAL LOW (ref 3.5–5.0)
Alkaline Phosphatase: 78 U/L (ref 38–126)
Anion gap: 16 — ABNORMAL HIGH (ref 5–15)
BUN: 82 mg/dL — ABNORMAL HIGH (ref 8–23)
CO2: 32 mmol/L (ref 22–32)
Calcium: 9.1 mg/dL (ref 8.9–10.3)
Chloride: 85 mmol/L — ABNORMAL LOW (ref 98–111)
Creatinine, Ser: 1.49 mg/dL — ABNORMAL HIGH (ref 0.44–1.00)
GFR, Estimated: 36 mL/min — ABNORMAL LOW (ref >60)
Glucose, Bld: 125 mg/dL — ABNORMAL HIGH (ref 70–99)
Potassium: 4.8 mmol/L (ref 3.5–5.1)
Sodium: 133 mmol/L — ABNORMAL LOW (ref 135–145)
Total Bilirubin: 1.8 mg/dL — ABNORMAL HIGH (ref 0.3–1.2)
Total Protein: 6.5 g/dL (ref 6.5–8.1)

## 2020-10-09 MED ORDER — FLUCONAZOLE 100 MG PO TABS
200.0000 mg | ORAL_TABLET | Freq: Every day | ORAL | Status: AC
  Administered 2020-10-09 – 2020-10-10 (×2): 200 mg
  Filled 2020-10-09 (×2): qty 2

## 2020-10-09 MED ORDER — HEPARIN SODIUM (PORCINE) 5000 UNIT/ML IJ SOLN
5000.0000 [IU] | Freq: Three times a day (TID) | INTRAMUSCULAR | Status: DC
  Administered 2020-10-09 – 2020-10-13 (×12): 5000 [IU] via SUBCUTANEOUS
  Filled 2020-10-09 (×12): qty 1

## 2020-10-09 MED ORDER — METOPROLOL TARTRATE 25 MG PO TABS
50.0000 mg | ORAL_TABLET | Freq: Four times a day (QID) | ORAL | Status: DC
  Administered 2020-10-09: 50 mg
  Filled 2020-10-09: qty 2

## 2020-10-09 MED ORDER — SORBITOL 70 % SOLN
60.0000 mL | Freq: Once | Status: AC
  Administered 2020-10-09: 60 mL
  Filled 2020-10-09: qty 60

## 2020-10-09 MED ORDER — GUAIFENESIN 100 MG/5ML PO SOLN
15.0000 mL | Freq: Four times a day (QID) | ORAL | Status: DC
  Administered 2020-10-09 – 2020-10-12 (×13): 300 mg
  Filled 2020-10-09: qty 10
  Filled 2020-10-09 (×2): qty 20
  Filled 2020-10-09 (×3): qty 10
  Filled 2020-10-09 (×2): qty 20
  Filled 2020-10-09 (×3): qty 10
  Filled 2020-10-09 (×2): qty 20
  Filled 2020-10-09 (×2): qty 10
  Filled 2020-10-09: qty 20
  Filled 2020-10-09: qty 10

## 2020-10-09 MED ORDER — DILTIAZEM HCL 60 MG PO TABS
60.0000 mg | ORAL_TABLET | Freq: Four times a day (QID) | ORAL | Status: DC
  Administered 2020-10-09: 60 mg
  Filled 2020-10-09: qty 1

## 2020-10-09 MED ORDER — SORBITOL 70 % SOLN
960.0000 mL | TOPICAL_OIL | Freq: Once | ORAL | Status: AC
  Administered 2020-10-09: 960 mL via RECTAL
  Filled 2020-10-09: qty 473

## 2020-10-09 MED ORDER — SODIUM CHLORIDE 0.9 % IV SOLN: 250.0000 mL | INTRAVENOUS | Status: DC

## 2020-10-09 MED ORDER — DILTIAZEM HCL-DEXTROSE 125-5 MG/125ML-% IV SOLN (PREMIX)
5.0000 mg/h | INTRAVENOUS | Status: DC
  Administered 2020-10-09: 5 mg/h via INTRAVENOUS
  Filled 2020-10-09: qty 125

## 2020-10-09 MED ORDER — PHENYLEPHRINE HCL-NACL 20-0.9 MG/250ML-% IV SOLN
25.0000 ug/min | INTRAVENOUS | Status: DC
Start: 2020-10-09 — End: 2020-10-14
  Administered 2020-10-09: 25 ug/min via INTRAVENOUS
  Administered 2020-10-10: 45 ug/min via INTRAVENOUS
  Administered 2020-10-10: 35 ug/min via INTRAVENOUS
  Administered 2020-10-11: 45 ug/min via INTRAVENOUS
  Administered 2020-10-11: 25 ug/min via INTRAVENOUS
  Administered 2020-10-12: 40 ug/min via INTRAVENOUS
  Administered 2020-10-12: 80 ug/min via INTRAVENOUS
  Administered 2020-10-12: 60 ug/min via INTRAVENOUS
  Administered 2020-10-12: 80 ug/min via INTRAVENOUS
  Administered 2020-10-12: 100 ug/min via INTRAVENOUS
  Administered 2020-10-13: 20 ug/min via INTRAVENOUS
  Filled 2020-10-09 (×10): qty 250

## 2020-10-09 MED ORDER — DILTIAZEM HCL ER COATED BEADS 180 MG PO CP24: 300.0000 mg | ORAL_CAPSULE | Freq: Every day | ORAL | Status: DC

## 2020-10-09 MED ORDER — POLYETHYLENE GLYCOL 3350 17 G PO PACK
17.0000 g | PACK | Freq: Two times a day (BID) | ORAL | Status: DC
  Administered 2020-10-09: 17 g
  Filled 2020-10-09: qty 1

## 2020-10-09 MED ORDER — SODIUM CHLORIDE 0.9 % IV SOLN: INTRAVENOUS | Status: DC

## 2020-10-09 MED ORDER — METOPROLOL TARTRATE 25 MG PO TABS
25.0000 mg | ORAL_TABLET | Freq: Two times a day (BID) | ORAL | Status: DC
  Administered 2020-10-09: 25 mg
  Filled 2020-10-09: qty 1

## 2020-10-09 NOTE — Plan of Care (Signed)
Discussed with patient plan of care for the evening, pain medication and pressors being added with some teach back displayed

## 2020-10-09 NOTE — Progress Notes (Signed)
Pt had a large emesis occurrence at shift change. This RN returned NG to LIS per prior order. BP is also low at this time, 80/56. Afib now rate controlled, cardizem gtt stopped per protocol. This RN notified Elink- RN of hypotension and emesis, awaiting further orders at this time. Nightshift RN also notified and will receive new orders.

## 2020-10-09 NOTE — Progress Notes (Signed)
Pt remains in Afib RVR despite other interventions. Cardiologist and triad MD notified by this RN. Orders placed by Cardiology to discontinue PO Cardizem and place patient back on gtt. This RN will continue to carefully monitor HR and titrate Cardizem gtt accordingly.

## 2020-10-09 NOTE — Progress Notes (Signed)
PT Cancellation Note  Patient Details Name: Kimberly Ballard MRN: [id number] DOB: 01/22/1941   Cancelled Treatment:    Reason Eval/Treat Not Completed: Medical issues which prohibited therapy, HR remains high, patient not feeling up to any mobility, then had an important phone call.   [name] 10/09/2020, 2:47 PM Blanchard Kelch PT Acute Rehabilitation Services Pager [phone] Office [phone]

## 2020-10-09 NOTE — Progress Notes (Signed)
Dr Benjamine Mola and Dr Izora Ribas with cards notified of Afib RVR despite increased metoprolol dosage. HR still 140's- 150 bpm and irregular, with stable BP. RN unable to give PRN metoprolol at this time. Per cards, PO cardizem dosage will be adjusted; awaiting orders at this time.

## 2020-10-09 NOTE — Progress Notes (Signed)
NAME:  Kimberly Ballard, MRN:  [id number], DOB:  1940-07-26, LOS: 17 ADMISSION DATE:  09/22/2020, CONSULTATION DATE:  09/23/2020 REFERRING MD:  Adela Glimpse - TRH CHIEF COMPLAINT:  Dyspnea, cough   History of Present Illness:  80 year old woman presented to [facility] ED 8/22 with dyspnea, body aches, cough after a significant COVID exposure. PMHx significant for AF (on Eliquis), COPD and former tobacco abuse.  Per patient, her daughter was diagnosed with COVID and patient developed symptoms ~8/16.  She discussed concerns with her PCP and paxlovid was prescribed 8/18.  At [facility], she was tested for COVID twice (both negative PCRs) and IgG antibody was also negative. Per PCP notes, patient did receive the primary series of the COVID vaccine as well as 2 subsequent boosters.    Pertinent Medical History:  COPD Atrial fibrillation Former cigarette smoker  Significant Hospital Events: Including procedures, antibiotic start and stop dates in addition to other pertinent events   8/22 - Admit to SDU.  Cough weak.  Pulmonary asked to evaluate given concern for progressive respiratory failure.  COVID-19 and influenza negative. Respiratory viral panel negative.   IV ceftriaxone and azithromycin initiated.  Given IV Solu-Medrol followed by oral prednisone order. 8/23 - Pulmonary asked to evaluate given worsening respiratory failure.  CT chest showing lower lobe consolidation.  There was concern about possible aspiration.  Echo obtained showed good left ventricular function with mild pulmonary artery hypertension. RUQ ultrasound showed hepatic steatosis, possibly cirrhosis based on nodular liver contour, no gallbladder 8/24 - Cardiology consulted for elevated troponins.  This was felt secondary to worsening hypoxia and underlying pulmonary artery hypertension which was felt previously undiagnosed issue.  New onset atrial fibrillation with RVR.  CT angiogram ordered.  Increased oxygen requirement overnight, requiring BiPAP,  later placed on 15 L/min via high flow.  Lower extremity ultrasound negative for DVT 8/25 - Urine strep neg.  Still high FIO2. F/u COVID neg. Transitioned to oral CCB and DOAC. Getting IV lasix. Transitioned to headed High flow [place] during day and BIPAP at night.  8/26 - Still w/ overall net positive fluid balance over hospital stay. CXR w/ worse edema. Lasix increased to 80 mg q 12. Azithromycin stopped (day 5) Steroids reduced.  Cardiology signed off. (See note from cards w/ plan to follow as out-pt)  8/27 - Diuresed, CXR better, eating, got up to chair 8/28 -9/1 Remains on FIO2 80% despite aggressive diuresis. Net neg 11L. 9/02 - Ongoing High FiO2 requirements. Net -13.7L/admission (-1.6L/24H). Tolerating HFNC/BiPAP and nebulization treatments. New hematuria for which Urology consulted, started CBI. 9/3 - Slight improvement in FiO2 9/4 - PCCM signed off 9/8 - Remains on HHFNC 25L/45% FiO2, pursed-lip breathing. NGT in place for ileus.  Interim History / Subjective:  Reports that her breathing feels more difficult since earlier today Pursed-lip breathing on exam Remains on HHFNC 25LPM/45% FiO2 Sats 87-94% today Belly feels bloated, passing some gas but minimal Bms NGT remains in place - CCS managing Mildly TTP over RLQ  Objective   Blood pressure 123/85, pulse 86, temperature 98.1 F (36.7 C), temperature source Oral, resp. rate (!) 45, height 5\' 3"  (1.6 m), weight 84.8 kg, SpO2 (!) 89 %.    FiO2 (%):  [45 %] 45 %   Intake/Output Summary (Last 24 hours) at 10/09/2020 1515 Last data filed at 10/09/2020 1459 Gross per 24 hour  Intake 647.19 ml  Output 1175 ml  Net -527.81 ml    Filed Weights   10/07/20 0500 10/08/20 0500 10/09/20  0320  Weight: 87.3 kg 84.5 kg 84.8 kg   Physical Examination: General: Ill-appearing elderly woman in NAD. HEENT: [place]/AT, anicteric sclera, PERRL, dry mucous membranes. Neuro: Awake, oriented x 4. Responds to verbal stimuli. Following commands consistently.  Moves all 4 extremities spontaneously.  CV: Irregularly irregular rhythm, rate 100s, no m/g/r. PULM: Breathing even and mildly labored on HHFNC (25LPM, FiO2 45%). Pursed lip breathing, no accessory muscle use. Lung fields diminished, ?crackles R base. GI: Soft, moderately distended, RLQ TTP. Hypoactive bowel sounds. Extremities: Trace BLE edema noted. Skin: Warm/dry, no rashes.  Resolved Hospital Problem List:    Assessment & Plan:  Acute hypoxemic/hypercarbic respiratory failure Suspect more chronic in nature given well-compensated respiratory acidosis as well as O2 sat 88% (2019).  In addition, she has polycythemia (likely in the setting of chronic hypoxemia).  She has never been on HOT. Now worsened with bilateral nodular infiltrates as well as atelectasis.  Suspect shunt physiology, not well-compensated. She has been treated with antibiotics as well as steroid taper without much improvement, though this may be gradual over time.  Some concern for COVID pneumonitis, although she has been COVID tested and negative x 2 despite close contacts with positive COVID tests.  She has equivocal platypnea on exam. Orthodeoxia not well-established, but does seem to have higher sats at times when more supine.  Some concern for cirrhosis, which would put her at risk for hepatopulmonary syndrome. Also suspect component of abdominal bloating (2/2 ileus/constipation) is contributing to her subjective SOB. - Goal O2 sat 88-92% - Ongoing diuresis as tolerated (per Cardiology) - Increase mobility as able (OOB, PT/OT) - Bronchodilators as ordered - Pulmonary hygiene with IS, increased mobility - Eventual rehab when clinically able   COPD Based on PFTs 2019.  Physiologically quite severe with what appears to be chronic hypoxemic respiratory failure, although not on chronic HOT. S/p steroid taper. - Continue bronchodilators  Severe constipation, ileus vs. 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 No biopsy. No established diagnosis of portal hypertension. Would put her at risk of potential hepatopulmonary syndrome, although this is not well-established in this patient. - Outpatient evaluation/follow up as appropriate  Best Practice: (right click and "Reselect all SmartList Selections" daily)   Per Primary Team  Critical care time: N/A   Faythe Ghee [facility] 10/09/20 3:15 PM  Please see [email or web address] for pager details.  From 7A-7P if no response, please call [phone] After hours, please call ELink [phone]

## 2020-10-09 NOTE — Progress Notes (Signed)
eLink Physician-Brief Progress Note Patient Name: Kimberly Ballard DOB: 1940/03/23 MRN: [id number]   Date of Service  10/09/2020  HPI/Events of Note  Patient is hypotensive and Phenylephrine gtt has just been started.  eICU Interventions  PRN iv Lopressor order discontinued, and dose of oral Lopressor scheduled for tonight has been held.        Kimberly Ballard 10/09/2020, 8:53 PM

## 2020-10-09 NOTE — Progress Notes (Signed)
Per nightshift RN, Patient's afib has remained uncontrolled overnight despite scheduled PO antiarrhythmics and PRN metoprolol Q4H. This shift, HR has remained 130's- 150 bpm at rest. Pt denies chest pain at this time. Dr Benjamine Mola notified by this RN, and dosages of current medication adjusted. This RN will continue to monitor patient's afib and implement ordered interventions as needed.

## 2020-10-09 NOTE — Progress Notes (Signed)
Progress Note    Kimberly Ballard  MLY:[id number] DOB: 1940-11-19  DOA: 09/22/2020 PCP: System, Provider Not In    Brief Narrative:     Medical records reviewed and are as summarized below:  Kimberly Ballard is an 80 y.o. female with a history of COPD presented with shortness of breath.  She was found to be hypoxemic requiring 6 L of oxygen.  CTA chest ruled out pulmonary embolism but showed bilateral infiltrates.  Patient started on Rocephin and Zithromax for CAP.  Also went into A. fib with RVR required BiPAP.  Cardiology and pulmonology were consulted.  She was started on Cardizem drip and heparin GTT.  She has been transitioned to p.o. Cardizem.  Diuresed well with IV Lasix.  Patient is DNR.  She also developed hematuria.  Started on CBI per urology.  Urine culture grew Candida, started on Diflucan.  Slow to improve.    Assessment/Plan:   Active Problems:   CAP (community acquired pneumonia)   Acute respiratory failure with hypoxia and hypercapnia (HCC)   Sepsis (HCC)   Elevated LFTs   Cardiomegaly   Elevated troponin   Atrial fibrillation with RVR (HCC)   Acute and chronic respiratory failure (acute-on-chronic) (HCC)   Dyspnea   Acute hypoxemic/hypercapnic respiratory failure requiring BiPAP -Multifactorial from multifocal pneumonia, diastolic CHF -CTA chest ruled out pulmonary embolism, showed multifocal pneumonia -Completed antibiotics ,ceftriaxone Zithromax for 5 days -Continues on HFNC- wean as able -BiPAP as needed -There was concern for COVID-pneumonia however patient was COVID-negative x2 -Continue Brovana/Pulmicort/Yupelri -Continue as needed Xopenex   Ileus -KUB shows moderate stool burden - multiple dilated small bowel loops -NG Tube -GS consult -aggressive bowel regimen -may need GI consult   Atrial fibrillation with RVR -Continue Cardizem, metoprolol -Eliquis was on hold due to hematuria - plan to resume if no need for surgery/intervention   Acute  diastolic CHF -As per echo EF 55 to 60% -Cardiology consulted   Acute kidney injury -gentle IVF   Elevated troponin -Likely demand ischemia -Cardiology following   Right middle lobe nodule -Repeat CT chest in 3 months   Hematuria -Urology consulted, started on CBI -resolved  Hyponatremia -mild, trend   Yeast in urine -Started on Diflucan for 7 days   Hypokalemia -Replaced  obesity Body mass index is 33.12 kg/m.   Family Communication/Anticipated D/C date and plan/Code Status   DVT prophylaxis: eliquis on hold (will use heparin in meantime) Code Status: DNR Disposition Plan: Status is: Inpatient  Remains inpatient appropriate because:Inpatient level of care appropriate due to severity of illness  Dispo: The patient is from: Home              Anticipated d/c is to:  tbd              Patient currently is not medically stable to d/c.   Difficult to place patient No   Overall poor prognosis--  if continues to not progress, may need palliative care consult and consideration of hospice       Medical Consultants:   Urology Critical care cardiology     Subjective:   Has some BMs overnight.  Still with abdominal distention  Objective:    Vitals:   10/09/20 1114 10/09/20 1130 10/09/20 1142 10/09/20 1200  BP:  109/65  111/83  Pulse:  93 (!) 136 (!) 148  Resp:  (!) 27 (!) 31 (!) 21  Temp: 98.1 F (36.7 C)     TempSrc: Oral  SpO2:  91% 90% 91%  Weight:      Height:        Intake/Output Summary (Last 24 hours) at 10/09/2020 1239 Last data filed at 10/09/2020 1226 Gross per 24 hour  Intake 647.19 ml  Output 1175 ml  Net -527.81 ml   Filed Weights   10/07/20 0500 10/08/20 0500 10/09/20 0320  Weight: 87.3 kg 84.5 kg 84.8 kg    Exam:  General: Appearance:    Obese female in no acute distress   No BS, distended  Lungs:     On HFNC, respirations unlabored, diminished   Heart:    Tachycardic. irregular  MS:   All extremities are intact.     Neurologic:   Awake, alert, oriented x 3. No apparent focal neurological           defect.        Data Reviewed:   I have personally reviewed following labs and imaging studies:  Labs: Labs show the following:   Basic Metabolic Panel: Recent Labs  Lab 10/05/20 0304 10/06/20 0251 10/07/20 0253 10/08/20 0302 10/09/20 0251  NA 132* 132* 128* 131* 133*  K 3.5 3.4* 3.2* 4.9 4.8  CL 80* 79* 80* 83* 85*  CO2 43* 41* 33* 33* 32  GLUCOSE 117* 115* 149* 130* 125*  BUN 40* 36* 44* 61* 82*  CREATININE 0.60 0.66 0.78 1.14* 1.49*  CALCIUM 8.4* 8.4* 8.6* 8.7* 9.1  MG 2.2  --   --   --   --   PHOS 3.5  --   --   --   --    GFR Estimated Creatinine Clearance: 31.6 mL/min (A) (by C-G formula based on SCr of 1.49 mg/dL (H)). Liver Function Tests: Recent Labs  Lab 10/05/20 0304 10/08/20 0302 10/09/20 0251  AST 39 70* 47*  ALT 83* 124* 95*  ALKPHOS 59 69 78  BILITOT 2.0* 1.6* 1.8*  PROT 5.7* 6.4* 6.5  ALBUMIN 2.7* 3.0* 2.9*   No results for input(s): LIPASE, AMYLASE in the last 168 hours. No results for input(s): AMMONIA in the last 168 hours. Coagulation profile No results for input(s): INR, PROTIME in the last 168 hours.  CBC: Recent Labs  Lab 10/04/20 0230 10/05/20 0304 10/06/20 0251 10/08/20 0302 10/09/20 0251  WBC 25.1* 21.4* 18.1* 28.4* 31.6*  HGB 17.5* 18.3* 16.7* 17.7* 17.9*  HCT 50.6* 53.1* 48.3* 51.1* 52.0*  MCV 92.0 92.7 91.5 91.4 92.9  PLT 247 222 209 311 329   Cardiac Enzymes: No results for input(s): CKTOTAL, CKMB, CKMBINDEX, TROPONINI in the last 168 hours. BNP (last 3 results) No results for input(s): PROBNP in the last 8760 hours. CBG: Recent Labs  Lab 10/08/20 1958 10/08/20 2320 10/09/20 0354 10/09/20 0755 10/09/20 1112  GLUCAP 125* 130* 139* 151* 125*   D-Dimer: No results for input(s): DDIMER in the last 72 hours. Hgb A1c: No results for input(s): HGBA1C in the last 72 hours. Lipid Profile: No results for input(s): CHOL, HDL,  LDLCALC, TRIG, CHOLHDL, LDLDIRECT in the last 72 hours. Thyroid function studies: No results for input(s): TSH, T4TOTAL, T3FREE, THYROIDAB in the last 72 hours.  Invalid input(s): FREET3 Anemia work up: No results for input(s): VITAMINB12, FOLATE, FERRITIN, TIBC, IRON, RETICCTPCT in the last 72 hours. Sepsis Labs: Recent Labs  Lab 10/05/20 0304 10/06/20 0251 10/08/20 0302 10/09/20 0251  WBC 21.4* 18.1* 28.4* 31.6*    Microbiology Recent Results (from the past 240 hour(s))  Urine Culture     Status: Abnormal  Collection Time: 10/01/20 10:13 AM   Specimen: Urine, Catheterized  Result Value Ref Range Status   Specimen Description   Final    URINE, CATHETERIZED Performed at [facility], [address], [place], [place] [address]    Special Requests   Final    NONE Performed at [facility], [address] [place], [place] [address]    Culture 40,000 COLONIES/mL YEAST (A)  Final   Report Status 10/02/2020 FINAL  Final    Procedures and diagnostic studies:  CT ABDOMEN PELVIS W CONTRAST  Result Date: 10/08/2020 CLINICAL DATA:  Abdominal distention and discomfort. EXAM: CT ABDOMEN AND PELVIS WITH CONTRAST TECHNIQUE: Multidetector CT imaging of the abdomen and pelvis was performed using the standard protocol following bolus administration of intravenous contrast. CONTRAST:  78mL OMNIPAQUE IOHEXOL 350 MG/ML SOLN COMPARISON:  09/30/2020 FINDINGS: Lower chest: Nodular and consolidative opacities are seen in the lung bases bilaterally. Hepatobiliary: No suspicious focal abnormality within the liver parenchyma. Gallbladder is surgically absent. No intrahepatic or extrahepatic biliary dilation. Pancreas: No focal mass lesion. No dilatation of the main duct. No intraparenchymal cyst. No peripancreatic edema. Spleen: No splenomegaly. No focal mass lesion. Adrenals/Urinary Tract: No adrenal nodule or mass. Tiny hypodensity upper pole left kidney is too  small to characterize but likely benign. Right kidney unremarkable. No evidence for hydroureter. Bladder decompressed by Foley catheter. Stomach/Bowel: NG tube tip is in the proximal duodenum. Proximal small bowel loops are distended up to 3.1 cm diameter. Distal small bowel is less distended. Large stool volume noted in the distended right and transverse colon with gradual tapering of the colon through the descending and sigmoid segments. There is some minimal pericolonic edema around the ascending colon. 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 Vascular/Lymphatic: There is moderate atherosclerotic calcification of the abdominal aorta without aneurysm. There is no gastrohepatic or hepatoduodenal ligament lymphadenopathy. No retroperitoneal or mesenteric lymphadenopathy. Reproductive: No pelvic sidewall lymphadenopathy. Other: There is some trace fluid in the right paracolic gutter in right pelvis. Musculoskeletal: No worrisome lytic or sclerotic osseous abnormality. IMPRESSION: 1. Large stool volume in the distended right and transverse colon with gradual tapering of the colon through the descending and sigmoid segments. Imaging features are nonspecific although there is some pericolonic edema on the right with trace fluid in the right paracolic gutter. Component of colitis not excluded. 2. Proximal small bowel loops are distended up to 3.1 cm diameter. Distal small bowel is less distended. No findings to suggest overt mechanical small-bowel obstruction. 3. Nodular and consolidative opacities in the lung bases bilaterally. Imaging features are compatible with multifocal pneumonia. 4. Trace fluid in the right paracolic gutter and right pelvis. 5. Aortic Atherosclerosis (ICD10-I70.0). Electronically Signed   By: Kennith Center M.D.   On: 10/08/2020 14:43   DG Abd Portable 1V  Result Date: 10/09/2020 CLINICAL DATA:  Ileus EXAM: PORTABLE ABDOMEN - 1 VIEW COMPARISON:  the previous day's study  FINDINGS: Nasogastric tube into the nondilated stomach. There are multiple distended and dilated small bowel loops throughout the abdomen. The degree of abdominal dilatation appears slightly increased since previous exam. No convincing passage of oral contrast into the colon. There is a large amount fecal material proximal colon which appears decompressed distally. IMPRESSION: Small bowel dilatation without contrast progression into the stool-distended proximal colon suggesting fecal obstipation. Electronically Signed   By: Corlis Leak M.D.   On: 10/09/2020 07:45   DG Abd Portable 1V  Result Date: 10/08/2020 CLINICAL DATA:  Nasogastric  tube placement. EXAM: PORTABLE ABDOMEN - 1 VIEW COMPARISON:  CT earlier today. FINDINGS: Tip and side port of the enteric tube below the diaphragm in the stomach. Retained contrast within small bowel in the central abdomen, partially included in the field of view. Mild colonic distension with air and stool, also partially included. No evidence of free air. IMPRESSION: Tip and side port of the enteric tube below the diaphragm in the stomach. Electronically Signed   By: Narda RutherfordMelanie  Sanford M.D.   On: 10/08/2020 17:52   DG Abd Portable 1V  Result Date: 10/07/2020 CLINICAL DATA:  NG tube placement EXAM: PORTABLE ABDOMEN - 1 VIEW COMPARISON:  10/07/2020 FINDINGS: Patchy airspace disease at the bases. Esophageal tube tip overlies the distal stomach. Persistent gaseous dilatation large and small bowel. IMPRESSION: Esophageal tube tip overlies distal stomach. Persistent bowel distention Electronically Signed   By: Jasmine PangKim  Fujinaga M.D.   On: 10/07/2020 19:36   DG Basil Dessaso G Tube Plc W/Fl W/Rad  Result Date: 10/07/2020 CLINICAL DATA:  Small bowel obstruction. EXAM: NASO G TUBE PLACEMENT WITH FL AND WITH RAD FLUOROSCOPY TIME:  Fluoroscopy Time: < 1 second Radiation Exposure Index (if provided by the fluoroscopic device): 0.4 mGy Number of Acquired Spot Images: None COMPARISON:  Abdominal  radiograph 10/07/2020. CT abdomen/pelvis 09/30/2020. FINDINGS: Multiple attempts at nasogastric tube placement via both the right and left nares were unsuccessful. The NG tube could not be advanced beyond the nasal cavities/nasopharynx. IMPRESSION: Unsuccessful fluoroscopically-guided NG tube placement, as described. Electronically Signed   By: Jackey LogeKyle  Golden D.O.   On: 10/07/2020 16:36    Medications:    arformoterol  15 mcg Nebulization BID   budesonide (PULMICORT) nebulizer solution  0.5 mg Nebulization BID   chlorhexidine  15 mL Mouth Rinse BID   Chlorhexidine Gluconate Cloth  6 each Topical Daily   diltiazem  60 mg Per Tube Q6H   fluconazole  200 mg Per Tube Daily   Gerhardt's butt cream   Topical TID   guaiFENesin  15 mL Per Tube QID   insulin aspart  0-9 Units Subcutaneous TID WC   mouth rinse  15 mL Mouth Rinse q12n4p   metoprolol tartrate  50 mg Per Tube Q6H   pantoprazole (PROTONIX) IV  40 mg Intravenous Q12H   polyethylene glycol  17 g Per Tube BID   revefenacin  175 mcg Nebulization Daily   sorbitol, milk of mag, mineral oil, glycerin (SMOG) enema  960 mL Rectal Once   Continuous Infusions:  sodium chloride Stopped (09/29/20 1824)   sodium chloride 50 mL/hr at 10/09/20 1226     LOS: 17 days   Joseph ArtJessica U Ahuva Poynor  [facility]   How to contact the [facility] Attending or Consulting provider 7A - 7P or covering provider during after hours 7P -7A, for this patient?  Check the care team in [facility] and look for a) attending/consulting [facility] provider listed and b) the [facility] team listed Log into [email or web address] and use [place]'s universal password to access. If you do not have the password, please contact the hospital operator. Locate the [facility] provider you are looking for under [facility] and page to a number that you can be directly reached. If you still have difficulty reaching the provider, please page the [facility] (Director on Call) for the Hospitalists listed on amion for  assistance.  10/09/2020, 12:39 PM

## 2020-10-09 NOTE — Progress Notes (Signed)
eLink Physician-Brief Progress Note Patient Name: Kimberly Ballard DOB: 1940-04-27 MRN: [id number]   Date of Service  10/09/2020  HPI/Events of Note  Patient with a soft blood pressure following emesis of bilious material without blood, BP 86/50, MAP 61, patient's saturation is also down to low 80's.  eICU Interventions  Stat portable CXR  r/o aspiration, oxygen flow increased to 35 liters and FIO2 increased to 60 %, peripheral Neo gtt ordered to keep MAP > 65 mmHg.        Thomasene Lot Tais Koestner 10/09/2020, 7:47 PM

## 2020-10-09 NOTE — Progress Notes (Signed)
OT Cancellation Note  Patient Details Name: Kimberly Ballard MRN: [id number] DOB: Jan 08, 1941   Cancelled Treatment:    Reason Eval/Treat Not Completed: Medical issues which prohibited therapy. Patient with elevated HR, RN did clear patient for bed level therapy however patient refusing.   [name] OT OT pager: [phone]   [name] 10/09/2020, 1:44 PM

## 2020-10-09 NOTE — Progress Notes (Signed)
Pt still with NG tube in and refusing BiPAP qhs.  Pt wishes to keep her HHFNC on while sleeping tonight.

## 2020-10-09 NOTE — Progress Notes (Signed)
Progress Note  Patient Name: Kimberly Ballard Date of Encounter: 10/09/2020  [facility] Cardiologist: Armanda Magicraci Turner, MD   Subjective   No acute overnight events.  Still have elevated HR.  Still have some abdominal pain.  No change in SOB Inpatient Medications    Scheduled Meds:  arformoterol  15 mcg Nebulization BID   budesonide (PULMICORT) nebulizer solution  0.5 mg Nebulization BID   chlorhexidine  15 mL Mouth Rinse BID   Chlorhexidine Gluconate Cloth  6 each Topical Daily   diltiazem  240 mg Oral Daily   fluconazole  200 mg Oral Daily   Gerhardt's butt cream   Topical TID   guaiFENesin  600 mg Oral BID   insulin aspart  0-9 Units Subcutaneous TID WC   mouth rinse  15 mL Mouth Rinse q12n4p   metoprolol tartrate  25 mg Oral BID   pantoprazole (PROTONIX) IV  40 mg Intravenous Q12H   polyethylene glycol  17 g Oral BID   revefenacin  175 mcg Nebulization Daily   Continuous Infusions:  sodium chloride Stopped (09/29/20 1824)   PRN Meds: sodium chloride, levalbuterol, metoprolol tartrate, oxybutynin, simethicone, sodium chloride   Vital Signs    Vitals:   10/09/20 0600 10/09/20 0700 10/09/20 0722 10/09/20 0759  BP: 107/74 113/83    Pulse: (!) 106 (!) 126    Resp: (!) 22 (!) 25    Temp:  98.8 F (37.1 C)    TempSrc:  Oral    SpO2: 92% 93% 91% 90%  Weight:      Height:        Intake/Output Summary (Last 24 hours) at 10/09/2020 0809 Last data filed at 10/09/2020 0700 Gross per 24 hour  Intake 320 ml  Output 700 ml  Net -380 ml   Last 3 Weights 10/09/2020 10/08/2020 10/07/2020  Weight (lbs) 186 lb 15.2 oz 186 lb 4.6 oz 192 lb 7.4 oz  Weight (kg) 84.8 kg 84.5 kg 87.3 kg      Telemetry    Atrial fibrillation with resting rates mostly in the 120s but as high as the 130s to 140s. - Personally Reviewed  ECG    No new ECG tracing today. - Personally Reviewed  Physical Exam   GEN: No acute distress.   Neck: No JVD. Cardiac: Tachycardic with irregularly irregular  rhythm. No murmurs, rubs, or gallops.  Respiratory: Mild increased work of breathing on HFNC. Decreased breath sounds bilaterally GI: Soft, distended, but non-tender. NG tube in place. MS: Trace bilateral lower extremity edema. No deformity. Skin: Warm and dry. Neuro:  No focal deficits. Psych: Normal affect. Responds appropriately.  Labs    High Sensitivity Troponin:   Recent Labs  Lab 09/22/20 2148 09/23/20 0207 09/24/20 0239 10/04/20 1721 10/04/20 1923  TROPONINIHS 128* 100* 52* 59* 67*      Chemistry Recent Labs  Lab 10/05/20 0304 10/06/20 0251 10/07/20 0253 10/08/20 0302 10/09/20 0251  NA 132*   < > 128* 131* 133*  K 3.5   < > 3.2* 4.9 4.8  CL 80*   < > 80* 83* 85*  CO2 43*   < > 33* 33* 32  GLUCOSE 117*   < > 149* 130* 125*  BUN 40*   < > 44* 61* 82*  CREATININE 0.60   < > 0.78 1.14* 1.49*  CALCIUM 8.4*   < > 8.6* 8.7* 9.1  PROT 5.7*  --   --  6.4* 6.5  ALBUMIN 2.7*  --   --  3.0* 2.9*  AST 39  --   --  70* 47*  ALT 83*  --   --  124* 95*  ALKPHOS 59  --   --  69 78  BILITOT 2.0*  --   --  1.6* 1.8*  GFRNONAA >60   < > >60 49* 36*  ANIONGAP 9   < > 15 15 16*   < > = values in this interval not displayed.     Hematology Recent Labs  Lab 10/06/20 0251 10/08/20 0302 10/09/20 0251  WBC 18.1* 28.4* 31.6*  RBC 5.28* 5.59* 5.60*  HGB 16.7* 17.7* 17.9*  HCT 48.3* 51.1* 52.0*  MCV 91.5 91.4 92.9  MCH 31.6 31.7 32.0  MCHC 34.6 34.6 34.4  RDW 13.1 13.6 13.8  PLT 209 311 329    BNP Recent Labs  Lab 10/03/20 0255  BNP 430.5*     DDimer No results for input(s): DDIMER in the last 168 hours.   Radiology    CT ABDOMEN PELVIS W CONTRAST  Result Date: 10/08/2020 CLINICAL DATA:  Abdominal distention and discomfort. EXAM: CT ABDOMEN AND PELVIS WITH CONTRAST TECHNIQUE: Multidetector CT imaging of the abdomen and pelvis was performed using the standard protocol following bolus administration of intravenous contrast. CONTRAST:  86mL OMNIPAQUE IOHEXOL 350  MG/ML SOLN COMPARISON:  09/30/2020 FINDINGS: Lower chest: Nodular and consolidative opacities are seen in the lung bases bilaterally. Hepatobiliary: No suspicious focal abnormality within the liver parenchyma. Gallbladder is surgically absent. No intrahepatic or extrahepatic biliary dilation. Pancreas: No focal mass lesion. No dilatation of the main duct. No intraparenchymal cyst. No peripancreatic edema. Spleen: No splenomegaly. No focal mass lesion. Adrenals/Urinary Tract: No adrenal nodule or mass. Tiny hypodensity upper pole left kidney is too small to characterize but likely benign. Right kidney unremarkable. No evidence for hydroureter. Bladder decompressed by Foley catheter. Stomach/Bowel: NG tube tip is in the proximal duodenum. Proximal small bowel loops are distended up to 3.1 cm diameter. Distal small bowel is less distended. Large stool volume noted in the distended right and transverse colon with gradual tapering of the colon through the descending and sigmoid segments. There is some minimal pericolonic edema around the ascending colon. 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 Vascular/Lymphatic: There is moderate atherosclerotic calcification of the abdominal aorta without aneurysm. There is no gastrohepatic or hepatoduodenal ligament lymphadenopathy. No retroperitoneal or mesenteric lymphadenopathy. Reproductive: No pelvic sidewall lymphadenopathy. Other: There is some trace fluid in the right paracolic gutter in right pelvis. Musculoskeletal: No worrisome lytic or sclerotic osseous abnormality. IMPRESSION: 1. Large stool volume in the distended right and transverse colon with gradual tapering of the colon through the descending and sigmoid segments. Imaging features are nonspecific although there is some pericolonic edema on the right with trace fluid in the right paracolic gutter. Component of colitis not excluded. 2. Proximal small bowel loops are distended up to 3.1 cm  diameter. Distal small bowel is less distended. No findings to suggest overt mechanical small-bowel obstruction. 3. Nodular and consolidative opacities in the lung bases bilaterally. Imaging features are compatible with multifocal pneumonia. 4. Trace fluid in the right paracolic gutter and right pelvis. 5. Aortic Atherosclerosis (ICD10-I70.0). Electronically Signed   By: Kennith Center M.D.   On: 10/08/2020 14:43   DG Abd Portable 1V  Result Date: 10/09/2020 CLINICAL DATA:  Ileus EXAM: PORTABLE ABDOMEN - 1 VIEW COMPARISON:  the previous day's study FINDINGS: Nasogastric tube into the nondilated stomach. There are multiple distended and dilated small bowel loops  throughout the abdomen. The degree of abdominal dilatation appears slightly increased since previous exam. No convincing passage of oral contrast into the colon. There is a large amount fecal material proximal colon which appears decompressed distally. IMPRESSION: Small bowel dilatation without contrast progression into the stool-distended proximal colon suggesting fecal obstipation. Electronically Signed   By: Corlis Leak M.D.   On: 10/09/2020 07:45   DG Abd Portable 1V  Result Date: 10/08/2020 CLINICAL DATA:  Nasogastric tube placement. EXAM: PORTABLE ABDOMEN - 1 VIEW COMPARISON:  CT earlier today. FINDINGS: Tip and side port of the enteric tube below the diaphragm in the stomach. Retained contrast within small bowel in the central abdomen, partially included in the field of view. Mild colonic distension with air and stool, also partially included. No evidence of free air. IMPRESSION: Tip and side port of the enteric tube below the diaphragm in the stomach. Electronically Signed   By: Narda Rutherford M.D.   On: 10/08/2020 17:52   DG Abd Portable 1V  Result Date: 10/07/2020 CLINICAL DATA:  NG tube placement EXAM: PORTABLE ABDOMEN - 1 VIEW COMPARISON:  10/07/2020 FINDINGS: Patchy airspace disease at the bases. Esophageal tube tip overlies the distal  stomach. Persistent gaseous dilatation large and small bowel. IMPRESSION: Esophageal tube tip overlies distal stomach. Persistent bowel distention Electronically Signed   By: Jasmine Pang M.D.   On: 10/07/2020 19:36   DG Basil Dess Tube Plc W/Fl W/Rad  Result Date: 10/07/2020 CLINICAL DATA:  Small bowel obstruction. EXAM: NASO G TUBE PLACEMENT WITH FL AND WITH RAD FLUOROSCOPY TIME:  Fluoroscopy Time: < 1 second Radiation Exposure Index (if provided by the fluoroscopic device): 0.4 mGy Number of Acquired Spot Images: None COMPARISON:  Abdominal radiograph 10/07/2020. CT abdomen/pelvis 09/30/2020. FINDINGS: Multiple attempts at nasogastric tube placement via both the right and left nares were unsuccessful. The NG tube could not be advanced beyond the nasal cavities/nasopharynx. IMPRESSION: Unsuccessful fluoroscopically-guided NG tube placement, as described. Electronically Signed   By: Jackey Loge D.O.   On: 10/07/2020 16:36    Cardiac Studies   Complete Echo 09/23/2020: Impressions:  1. Left ventricular ejection fraction, by estimation, is 55 to 60%. Left  ventricular ejection fraction by 3D volume is 59 %. The left ventricle has  normal function. The left ventricle has no regional wall motion  abnormalities. There is moderate left  ventricular hypertrophy. Left ventricular diastolic parameters are  consistent with Grade I diastolic dysfunction (impaired relaxation).   2. Right ventricular systolic function mildly reduced at base and mid,  with relative preserved apical systolic function. The right ventricular  size is moderately enlarged. There is moderately elevated pulmonary artery  systolic pressure. The estimated  right ventricular systolic pressure is 48.4 mmHg.   3. Left atrial size was mildly dilated.   4. Right atrial size was moderately dilated.   5. The mitral valve is normal in structure. Trivial mitral valve  regurgitation. No evidence of mitral stenosis.   6. Tricuspid valve  regurgitation is mild to moderate.   7. The aortic valve is normal in structure. Aortic valve regurgitation is  not visualized. No aortic stenosis is present.   8. The inferior vena cava is dilated in size with <50% respiratory  variability, suggesting right atrial pressure of 15 mmHg.  _______________   Limited Echo with Bubble Study 10/01/2020: Impressions: 1. Left ventricular ejection fraction, by estimation, is 60 to 65%. The  left ventricle has normal function. There is moderate concentric left  ventricular hypertrophy. Left  ventricular diastolic function could not be  evaluated.   2. The mitral valve is grossly normal. Trivial mitral valve  regurgitation. No evidence of mitral stenosis.   3. The aortic valve is tricuspid. There is mild calcification of the  aortic valve. Aortic valve regurgitation is trivial. No aortic stenosis is  present.   4. Agitated saline contrast bubble study was negative, with no evidence  of any interatrial shunt.   Conclusion(s)/Recommendation(s): Limited study, no major change from  recent study 09/23/20. Bubble study negative for intraatrial shunt.   Patient Profile     80 y.o. female with a history of COPD with prior tobacco use (quit in 2019) who was admitted on 09/22/2020 for acute hypoxic respiratory failure secondary to COPD exacerbation and multifocal pneumonia after presenting with shortness of breath, body aches, cough, and decreased appetite. Negative for COVID this admission. Cardiology consulted for elevated troponin. Did alter develop atrial fibrillation with RVR and was started on Cardizem and Eliquis with control of rates but not restoration of sinus rhythm Cardiology signed off on 09/26/2020 with plans to have patient follow-up in the [facility]. Fib [facility] and likely DCCV after 304 weeks of anticoagulation. We were reconsulted on 10/01/2020 given recurrent RVR.  Assessment & Plan    New Onset Atrial Fibrillation with RVR - In the setting of acute  hypoxic respiratory failure secondary to COPD exacerbation and pneumonia. Rates improved from 9/9 but still elevated - Continue Cardizem ER because of NG tube today daily and Lopressor 25mg  twice daily. We have increase to metoprolol 50 mg - Initially started on Eliquis.  If no plans for surgery, patient is planned for restart - No plan for restoration of sinus rhythm at this time until there is substantial improvement in respiratory status. This can be discussed as an outpatient.    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  - Most recent chest x-ray on 10/04/2020 showed bilateral lower atelectasis or infiltrates slightly improved from prior study. - Echo showed LVEF of 59% but moderately enlarged RV with mildly reduced systolic function at the base and mid segments with relatively preserved apical systolic function. This can be seen in acute PE  but chest CTA negative for this. - IV Lasix decreased to 40mg  twice daily yesterday. Documented 1.3 L of urinary output yesterday. Net negative 19.3 L this admission.  - RV dysfunction felt to be secondary to pulmonary hypertension. This is felt to be chronic but worsened in the setting of acute COPD exacerbation and pneumonia.  - Continue to monitor daily weights, strict I/Os, and renal function. - given AKI today OK for gentle fluids and we have not restarted lasix at this time; she is not eating ileus plan is ongoing  Elevated Troponin - High-sensitivity troponin mildly elevated and flat. Peaked at 128.  - EKG showed T wave abnormality in anterior leads. No prior tracing for comparison. - Echo as above. - Troponin elevation not felt to be ACS and more consistent with demand. She does have coronary calcification on CT so may benefit from outpatient Lexiscan once she recovers from acute illness.    Hypokalemia - Potassium 4.8 - Continue to monitor.   Otherwise, per primary team: - Acute  hypoxic/hypercapnic respiratory failure requiring intermittent BiPAP - COPD Exacerbation - Multifocal pneumonia  - Abdominal pain - Transamnitis  - Hyponatremia - Hematuria: Started on CBI. Urology consulted. - Ileus: S/p NGT placement. General Surgery consulted.  For questions or  updates, please contact [facility] Please consult [email or web address] for contact info under        Signed, Christell Constant, MD  10/09/2020, 8:09 AM

## 2020-10-09 NOTE — Progress Notes (Signed)
Progress Note     Subjective: Patient wants something to drink. Has had some large liquid BM overnight and one small BM this AM. Still has some abdominal pain. Abdomen still feels bloated. Patient denies getting out of bed yesterday.   Objective: Vital signs in last 24 hours: Temp:  [97.4 F (36.3 C)-98.8 F (37.1 C)] 98.8 F (37.1 C) (09/08 0800) Pulse Rate:  [31-152] 80 (09/08 1100) Resp:  [18-40] 21 (09/08 1100) BP: (98-171)/(66-138) 115/67 (09/08 1100) SpO2:  [87 %-97 %] 90 % (09/08 1100) FiO2 (%):  [45 %] 45 % (09/08 0800) Weight:  [84.8 kg] 84.8 kg (09/08 0320) Last BM Date: 10/09/20  Intake/Output from previous day: 09/07 0701 - 09/08 0700 In: 320 [P.O.:200; NG/GT:120] Out: 1300 [Urine:700; Emesis/NG output:600] Intake/Output this shift: Total I/O In: 120 [NG/GT:120] Out: -   PE: General: pleasant, WD, chronically ill appearing female who is laying in bed in NAD Heart: irregularly irregular and tachycardic Lungs: rales in bilateral bases.  on HFNC Abd: soft, mild ttp in RLQ without peritonitis, mild distention, BS hypoactive, NGT with bloody appearing drainage   Lab Results:  Recent Labs    10/08/20 0302 10/09/20 0251  WBC 28.4* 31.6*  HGB 17.7* 17.9*  HCT 51.1* 52.0*  PLT 311 329   BMET Recent Labs    10/08/20 0302 10/09/20 0251  NA 131* 133*  K 4.9 4.8  CL 83* 85*  CO2 33* 32  GLUCOSE 130* 125*  BUN 61* 82*  CREATININE 1.14* 1.49*  CALCIUM 8.7* 9.1   PT/INR No results for input(s): LABPROT, INR in the last 72 hours. CMP     Component Value Date/Time   NA 133 (L) 10/09/2020 0251   K 4.8 10/09/2020 0251   CL 85 (L) 10/09/2020 0251   CO2 32 10/09/2020 0251   GLUCOSE 125 (H) 10/09/2020 0251   BUN 82 (H) 10/09/2020 0251   CREATININE 1.49 (H) 10/09/2020 0251   CALCIUM 9.1 10/09/2020 0251   PROT 6.5 10/09/2020 0251   ALBUMIN 2.9 (L) 10/09/2020 0251   AST 47 (H) 10/09/2020 0251   ALT 95 (H) 10/09/2020 0251   ALKPHOS 78 10/09/2020 0251    BILITOT 1.8 (H) 10/09/2020 0251   GFRNONAA 36 (L) 10/09/2020 0251   GFRAA  09/16/2009 1757    >60        The eGFR has been calculated using the MDRD equation. This calculation has not been validated in all clinical situations. eGFR's persistently <60 mL/min signify possible Chronic Kidney Disease.   Lipase     Component Value Date/Time   LIPASE 54 (H) 09/30/2020 0250       Studies/Results: CT ABDOMEN PELVIS W CONTRAST  Result Date: 10/08/2020 CLINICAL DATA:  Abdominal distention and discomfort. EXAM: CT ABDOMEN AND PELVIS WITH CONTRAST TECHNIQUE: Multidetector CT imaging of the abdomen and pelvis was performed using the standard protocol following bolus administration of intravenous contrast. CONTRAST:  90m OMNIPAQUE IOHEXOL 350 MG/ML SOLN COMPARISON:  09/30/2020 FINDINGS: Lower chest: Nodular and consolidative opacities are seen in the lung bases bilaterally. Hepatobiliary: No suspicious focal abnormality within the liver parenchyma. Gallbladder is surgically absent. No intrahepatic or extrahepatic biliary dilation. Pancreas: No focal mass lesion. No dilatation of the main duct. No intraparenchymal cyst. No peripancreatic edema. Spleen: No splenomegaly. No focal mass lesion. Adrenals/Urinary Tract: No adrenal nodule or mass. Tiny hypodensity upper pole left kidney is too small to characterize but likely benign. Right kidney unremarkable. No evidence for hydroureter. Bladder decompressed by Foley  catheter. Stomach/Bowel: NG tube tip is in the proximal duodenum. Proximal small bowel loops are distended up to 3.1 cm diameter. Distal small bowel is less distended. Large stool volume noted in the distended right and transverse colon with gradual tapering of the colon through the descending and sigmoid segments. There is some minimal pericolonic edema around the ascending colon. 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 Vascular/Lymphatic: There is moderate  atherosclerotic calcification of the abdominal aorta without aneurysm. There is no gastrohepatic or hepatoduodenal ligament lymphadenopathy. No retroperitoneal or mesenteric lymphadenopathy. Reproductive: No pelvic sidewall lymphadenopathy. Other: There is some trace fluid in the right paracolic gutter in right pelvis. Musculoskeletal: No worrisome lytic or sclerotic osseous abnormality. IMPRESSION: 1. Large stool volume in the distended right and transverse colon with gradual tapering of the colon through the descending and sigmoid segments. Imaging features are nonspecific although there is some pericolonic edema on the right with trace fluid in the right paracolic gutter. Component of colitis not excluded. 2. Proximal small bowel loops are distended up to 3.1 cm diameter. Distal small bowel is less distended. No findings to suggest overt mechanical small-bowel obstruction. 3. Nodular and consolidative opacities in the lung bases bilaterally. Imaging features are compatible with multifocal pneumonia. 4. Trace fluid in the right paracolic gutter and right pelvis. 5. Aortic Atherosclerosis (ICD10-I70.0). Electronically Signed   By: Misty Stanley M.D.   On: 10/08/2020 14:43   DG Abd Portable 1V  Result Date: 10/09/2020 CLINICAL DATA:  Ileus EXAM: PORTABLE ABDOMEN - 1 VIEW COMPARISON:  the previous day's study FINDINGS: Nasogastric tube into the nondilated stomach. There are multiple distended and dilated small bowel loops throughout the abdomen. The degree of abdominal dilatation appears slightly increased since previous exam. No convincing passage of oral contrast into the colon. There is a large amount fecal material proximal colon which appears decompressed distally. IMPRESSION: Small bowel dilatation without contrast progression into the stool-distended proximal colon suggesting fecal obstipation. Electronically Signed   By: Lucrezia Europe M.D.   On: 10/09/2020 07:45   DG Abd Portable 1V  Result Date:  10/08/2020 CLINICAL DATA:  Nasogastric tube placement. EXAM: PORTABLE ABDOMEN - 1 VIEW COMPARISON:  CT earlier today. FINDINGS: Tip and side port of the enteric tube below the diaphragm in the stomach. Retained contrast within small bowel in the central abdomen, partially included in the field of view. Mild colonic distension with air and stool, also partially included. No evidence of free air. IMPRESSION: Tip and side port of the enteric tube below the diaphragm in the stomach. Electronically Signed   By: Keith Rake M.D.   On: 10/08/2020 17:52   DG Abd Portable 1V  Result Date: 10/07/2020 CLINICAL DATA:  NG tube placement EXAM: PORTABLE ABDOMEN - 1 VIEW COMPARISON:  10/07/2020 FINDINGS: Patchy airspace disease at the bases. Esophageal tube tip overlies the distal stomach. Persistent gaseous dilatation large and small bowel. IMPRESSION: Esophageal tube tip overlies distal stomach. Persistent bowel distention Electronically Signed   By: Donavan Foil M.D.   On: 10/07/2020 19:36   DG Loyce Dys Tube Plc W/Fl W/Rad  Result Date: 10/07/2020 CLINICAL DATA:  Small bowel obstruction. EXAM: NASO G TUBE PLACEMENT WITH FL AND WITH RAD FLUOROSCOPY TIME:  Fluoroscopy Time: < 1 second Radiation Exposure Index (if provided by the fluoroscopic device): 0.4 mGy Number of Acquired Spot Images: None COMPARISON:  Abdominal radiograph 10/07/2020. CT abdomen/pelvis 09/30/2020. FINDINGS: Multiple attempts at nasogastric tube placement via both the right and left nares were unsuccessful.  The NG tube could not be advanced beyond the nasal cavities/nasopharynx. IMPRESSION: Unsuccessful fluoroscopically-guided NG tube placement, as described. Electronically Signed   By: Kellie Simmering D.O.   On: 10/07/2020 16:36    Anti-infectives: Anti-infectives (From admission, onward)    Start     Dose/Rate Route Frequency Ordered Stop   10/09/20 1000  fluconazole (DIFLUCAN) tablet 200 mg        200 mg Per Tube Daily 10/09/20 0826 10/11/20  0959   10/08/20 1000  fluconazole (DIFLUCAN) tablet 200 mg  Status:  Discontinued        200 mg Oral Daily 10/07/20 1426 10/09/20 0826   10/03/20 1700  fluconazole (DIFLUCAN) tablet 200 mg  Status:  Discontinued        200 mg Oral Daily 10/03/20 1606 10/07/20 1426   09/23/20 1800  cefTRIAXone (ROCEPHIN) 2 g in sodium chloride 0.9 % 100 mL IVPB        2 g 200 mL/hr over 30 Minutes Intravenous Every 24 hours 09/22/20 1842 09/29/20 1749   09/23/20 1800  azithromycin (ZITHROMAX) 500 mg in sodium chloride 0.9 % 250 mL IVPB  Status:  Discontinued        500 mg 250 mL/hr over 60 Minutes Intravenous Every 24 hours 09/22/20 1842 09/25/20 1456   09/22/20 1630  cefTRIAXone (ROCEPHIN) 1 g in sodium chloride 0.9 % 100 mL IVPB        1 g 200 mL/hr over 30 Minutes Intravenous  Once 09/22/20 1618 09/22/20 1806   09/22/20 1630  azithromycin (ZITHROMAX) 500 mg in sodium chloride 0.9 % 250 mL IVPB        500 mg 250 mL/hr over 60 Minutes Intravenous  Once 09/22/20 1618 09/22/20 1939        Assessment/Plan Severe constipation - NGT placed 9/6 - 600 cc out in last 24h, ok to clamp and allow ice chips and sips today  - ?some suggestion of possible colitis on CT - likely stercoral  - Has had some BMs but still significant stool burden in R colon on films - gave gastrografin yesterday to try to get things moving above - s/p SMOG yesterday and would repeat today - sorbitol solution per tube today as well  - recommend keeping K>4.0 and Mg > 2.0 to optimize bowel function - mobilize as able  - if no further improvement by tomorrow, consider GI consult    FEN: ice chips and sips, IVF per TRH, NGT clamping as tolerated VTE: SCDs ID:[id number] diflucan 9/2>>   COPD with PNA AKI A. Fib with RVR UTI RML pulmonary nodule  LOS: 17 days    Norm Parcel, [facility] Surgery 10/09/2020, 11:12 AM Please see Amion for pager number during day hours 7:00am-4:30pm

## 2020-10-10 ENCOUNTER — Inpatient Hospital Stay (HOSPITAL_COMMUNITY): Payer: Medicare HMO

## 2020-10-10 DIAGNOSIS — I9589 Other hypotension: Secondary | ICD-10-CM

## 2020-10-10 DIAGNOSIS — R579 Shock, unspecified: Secondary | ICD-10-CM | POA: Diagnosis not present

## 2020-10-10 DIAGNOSIS — K567 Ileus, unspecified: Secondary | ICD-10-CM | POA: Diagnosis not present

## 2020-10-10 DIAGNOSIS — R7989 Other specified abnormal findings of blood chemistry: Secondary | ICD-10-CM | POA: Diagnosis not present

## 2020-10-10 DIAGNOSIS — I4891 Unspecified atrial fibrillation: Secondary | ICD-10-CM | POA: Diagnosis not present

## 2020-10-10 DIAGNOSIS — J9621 Acute and chronic respiratory failure with hypoxia: Secondary | ICD-10-CM | POA: Diagnosis not present

## 2020-10-10 DIAGNOSIS — R778 Other specified abnormalities of plasma proteins: Secondary | ICD-10-CM | POA: Diagnosis not present

## 2020-10-10 LAB — LACTIC ACID, PLASMA: Lactic Acid, Venous: 1.5 mmol/L (ref 0.5–1.9)

## 2020-10-10 LAB — GLUCOSE, CAPILLARY
Glucose-Capillary: 128 mg/dL — ABNORMAL HIGH (ref 70–99)
Glucose-Capillary: 128 mg/dL — ABNORMAL HIGH (ref 70–99)
Glucose-Capillary: 139 mg/dL — ABNORMAL HIGH (ref 70–99)
Glucose-Capillary: 115 mg/dL — ABNORMAL HIGH (ref 70–99)

## 2020-10-10 LAB — CBC
HCT: 48.4 % — ABNORMAL HIGH (ref 36.0–46.0)
Hemoglobin: 16.8 g/dL — ABNORMAL HIGH (ref 12.0–15.0)
MCH: 32 pg (ref 26.0–34.0)
MCHC: 34.7 g/dL (ref 30.0–36.0)
MCV: 92.2 fL (ref 80.0–100.0)
Platelets: 329 10*3/uL (ref 150–400)
RBC: 5.25 MIL/uL — ABNORMAL HIGH (ref 3.87–5.11)
RDW: 13.9 % (ref 11.5–15.5)
WBC: 21.7 10*3/uL — ABNORMAL HIGH (ref 4.0–10.5)
nRBC: 0 % (ref 0.0–0.2)

## 2020-10-10 LAB — MAGNESIUM: Magnesium: 3.3 mg/dL — ABNORMAL HIGH (ref 1.7–2.4)

## 2020-10-10 LAB — BASIC METABOLIC PANEL
Anion gap: 18 — ABNORMAL HIGH (ref 5–15)
BUN: 110 mg/dL — ABNORMAL HIGH (ref 8–23)
CO2: 31 mmol/L (ref 22–32)
Calcium: 8.5 mg/dL — ABNORMAL LOW (ref 8.9–10.3)
Chloride: 84 mmol/L — ABNORMAL LOW (ref 98–111)
Creatinine, Ser: 2.47 mg/dL — ABNORMAL HIGH (ref 0.44–1.00)
GFR, Estimated: 19 mL/min — ABNORMAL LOW (ref >60)
Glucose, Bld: 135 mg/dL — ABNORMAL HIGH (ref 70–99)
Potassium: 4.5 mmol/L (ref 3.5–5.1)
Sodium: 133 mmol/L — ABNORMAL LOW (ref 135–145)

## 2020-10-10 LAB — PROCALCITONIN: Procalcitonin: 2.6 ng/mL

## 2020-10-10 LAB — TROPONIN I (HIGH SENSITIVITY): Troponin I (High Sensitivity): 145 ng/L (ref <18)

## 2020-10-10 MED ORDER — POLYETHYLENE GLYCOL 3350 17 G PO PACK
17.0000 g | PACK | Freq: Three times a day (TID) | ORAL | Status: DC
  Administered 2020-10-10 – 2020-10-14 (×11): 17 g
  Filled 2020-10-10 (×10): qty 1

## 2020-10-10 MED ORDER — SODIUM CHLORIDE 0.9 % IV SOLN
2.0000 g | INTRAVENOUS | Status: DC
  Administered 2020-10-10 – 2020-10-11 (×2): 2 g via INTRAVENOUS
  Filled 2020-10-10 (×2): qty 2

## 2020-10-10 MED ORDER — AMIODARONE HCL IN DEXTROSE 360-4.14 MG/200ML-% IV SOLN
60.0000 mg/h | INTRAVENOUS | Status: AC
  Administered 2020-10-10 (×2): 60 mg/h via INTRAVENOUS
  Filled 2020-10-10: qty 200

## 2020-10-10 MED ORDER — AMIODARONE LOAD VIA INFUSION
150.0000 mg | Freq: Once | INTRAVENOUS | Status: AC
  Administered 2020-10-10: 150 mg via INTRAVENOUS
  Filled 2020-10-10: qty 83.34

## 2020-10-10 MED ORDER — BISACODYL 10 MG RE SUPP
10.0000 mg | Freq: Once | RECTAL | Status: AC
  Administered 2020-10-10: 10 mg via RECTAL
  Filled 2020-10-10: qty 1

## 2020-10-10 MED ORDER — METOPROLOL TARTRATE 5 MG/5ML IV SOLN
2.5000 mg | INTRAVENOUS | Status: DC | PRN
  Administered 2020-10-10: 2.5 mg via INTRAVENOUS
  Filled 2020-10-10: qty 5

## 2020-10-10 MED ORDER — METOPROLOL TARTRATE 5 MG/5ML IV SOLN
INTRAVENOUS | Status: AC
  Administered 2020-10-10: 2.5 mg via INTRAVENOUS
  Filled 2020-10-10: qty 5

## 2020-10-10 MED ORDER — VANCOMYCIN VARIABLE DOSE PER UNSTABLE RENAL FUNCTION (PHARMACIST DOSING): Status: DC

## 2020-10-10 MED ORDER — VANCOMYCIN HCL 1500 MG/300ML IV SOLN
1500.0000 mg | Freq: Once | INTRAVENOUS | Status: AC
  Administered 2020-10-10: 1500 mg via INTRAVENOUS
  Filled 2020-10-10: qty 300

## 2020-10-10 MED ORDER — AMIODARONE HCL IN DEXTROSE 360-4.14 MG/200ML-% IV SOLN
30.0000 mg/h | INTRAVENOUS | Status: DC
  Administered 2020-10-10 – 2020-10-19 (×16): 30 mg/h via INTRAVENOUS
  Filled 2020-10-10 (×18): qty 200

## 2020-10-10 MED ORDER — METOPROLOL TARTRATE 5 MG/5ML IV SOLN: 2.5000 mg | INTRAVENOUS | Status: DC | PRN

## 2020-10-10 NOTE — Progress Notes (Signed)
Progress Note    Kimberly Ballard  NWG:[id number] DOB: 12-06-1940  DOA: 09/22/2020 PCP: System, Provider Not In    Brief Narrative:     Medical records reviewed and are as summarized below:  Kimberly Ballard is an 80 y.o. female with a history of COPD presented with shortness of breath.  She was found to be hypoxemic requiring 6 L of oxygen.  CTA chest ruled out pulmonary embolism but showed bilateral infiltrates.  Patient started on Rocephin and Zithromax for CAP.  Cardiology and pulmonology were consulted.  Not improving, many complications including: ileus, hematuria, a fib with RVR.  Palliative care consult.    Assessment/Plan:   Active Problems:   CAP (community acquired pneumonia)   Acute respiratory failure with hypoxia and hypercapnia (HCC)   Sepsis (HCC)   Elevated LFTs   Cardiomegaly   Elevated troponin   Atrial fibrillation with RVR (HCC)   Acute and chronic respiratory failure (acute-on-chronic) (HCC)   Dyspnea   Acute hypoxemic/hypercapnic respiratory failure requiring BiPAP -Multifactorial from multifocal pneumonia, diastolic CHF -CTA chest ruled out pulmonary embolism, showed multifocal pneumonia -Completed antibiotics ,ceftriaxone Zithromax for 5 days -Continues on HFNC- wean as able -BiPAP as needed -There was concern for COVID-pneumonia however patient was COVID-negative x2 -Continue Brovana/Pulmicort/Yupelri -Continue as needed Xopenex   Hypotension -pressors per PCCM  Ileus -KUB shows moderate stool burden - multiple dilated small bowel loops -NG Tube -GS consult appreciated -GI consult appreciated -aggressive bowel regimen -with her tenuous cardiac and respiratory status do not  think she would be a candidate for aggressive intervention at this time   Atrial fibrillation with RVR -short term amiodarone -Eliquis was on hold due to hematuria - resume when able (heparin SQ for now)   Acute diastolic CHF -As per echo EF 55 to 60% -Cardiology  consulted   Acute kidney injury -gentle IVF -Cr trending up-- avoid hypotension   Elevated troponin -Likely demand ischemia and worsening renal failure -Cardiology following   Right middle lobe nodule -Repeat CT chest in 3 months if appropriate    Hematuria -Urology consulted, started on CBI -resolved  Hyponatremia -mild, trend   Yeast in urine -treated with diflucan   Hypokalemia -Replaced  obesity Body mass index is 33.12 kg/m.   Overall poor prognosis-- discussed my concern with patient and discussed palliative care consult for GOC-- patient is agreeable  Family Communication/Anticipated D/C date and plan/Code Status   DVT prophylaxis: eliquis on hold (will use heparin in meantime) Code Status: DNR Disposition Plan: Status is: Inpatient Declined me calling family  Remains inpatient appropriate because:Inpatient level of care appropriate due to severity of illness  Dispo: The patient is from: Home              Anticipated d/c is to:  tbd              Patient currently is not medically stable to d/c.   Difficult to place patient No     Medical Consultants:   Urology Critical care cardiology     Subjective:   C/o dry mouth  Objective:    Vitals:   10/10/20 0900 10/10/20 1000 10/10/20 1100 10/10/20 1200  BP:  (!) 85/59 (!) 86/62 (!) 89/64  Pulse: (!) 27 98 (!) 125 62  Resp: (!) 34 19 (!) 21 (!) 43  Temp:      TempSrc:      SpO2: 94% 92% 92% 93%  Weight:      Height:  Intake/Output Summary (Last 24 hours) at 10/10/2020 1403 Last data filed at 10/10/2020 1100 Gross per 24 hour  Intake 1510.05 ml  Output 1650 ml  Net -139.95 ml   Filed Weights   10/08/20 0500 10/09/20 0320 10/10/20 0500  Weight: 84.5 kg 84.8 kg 84.8 kg    Exam:   General: Appearance:    Obese female who appears uncomfortable     Lungs:     On HFNC, mild increased work of breathing with speaking  Heart:    Irr and fast  MS:   All extremities are intact.     Neurologic:   Weak appearing, awake, alert          Data Reviewed:   I have personally reviewed following labs and imaging studies:  Labs: Labs show the following:   Basic Metabolic Panel: Recent Labs  Lab 10/05/20 0304 10/06/20 0251 10/07/20 0253 10/08/20 0302 10/09/20 0251 10/10/20 0233  NA 132* 132* 128* 131* 133* 133*  K 3.5 3.4* 3.2* 4.9 4.8 4.5  CL 80* 79* 80* 83* 85* 84*  CO2 43* 41* 33* 33* 32 31  GLUCOSE 117* 115* 149* 130* 125* 135*  BUN 40* 36* 44* 61* 82* 110*  CREATININE 0.60 0.66 0.78 1.14* 1.49* 2.47*  CALCIUM 8.4* 8.4* 8.6* 8.7* 9.1 8.5*  MG 2.2  --   --   --   --  3.3*  PHOS 3.5  --   --   --   --   --    GFR Estimated Creatinine Clearance: 19.1 mL/min (A) (by C-G formula based on SCr of 2.47 mg/dL (H)). Liver Function Tests: Recent Labs  Lab 10/05/20 0304 10/08/20 0302 10/09/20 0251  AST 39 70* 47*  ALT 83* 124* 95*  ALKPHOS 59 69 78  BILITOT 2.0* 1.6* 1.8*  PROT 5.7* 6.4* 6.5  ALBUMIN 2.7* 3.0* 2.9*   No results for input(s): LIPASE, AMYLASE in the last 168 hours. No results for input(s): AMMONIA in the last 168 hours. Coagulation profile No results for input(s): INR, PROTIME in the last 168 hours.  CBC: Recent Labs  Lab 10/05/20 0304 10/06/20 0251 10/08/20 0302 10/09/20 0251 10/10/20 0233  WBC 21.4* 18.1* 28.4* 31.6* 21.7*  HGB 18.3* 16.7* 17.7* 17.9* 16.8*  HCT 53.1* 48.3* 51.1* 52.0* 48.4*  MCV 92.7 91.5 91.4 92.9 92.2  PLT 222 209 311 329 329   Cardiac Enzymes: No results for input(s): CKTOTAL, CKMB, CKMBINDEX, TROPONINI in the last 168 hours. BNP (last 3 results) No results for input(s): PROBNP in the last 8760 hours. CBG: Recent Labs  Lab 10/09/20 1112 10/09/20 1558 10/09/20 2208 10/10/20 0816 10/10/20 1227  GLUCAP 125* 195* 167* 128* 128*   D-Dimer: No results for input(s): DDIMER in the last 72 hours. Hgb A1c: No results for input(s): HGBA1C in the last 72 hours. Lipid Profile: No results for input(s):  CHOL, HDL, LDLCALC, TRIG, CHOLHDL, LDLDIRECT in the last 72 hours. Thyroid function studies: No results for input(s): TSH, T4TOTAL, T3FREE, THYROIDAB in the last 72 hours.  Invalid input(s): FREET3 Anemia work up: No results for input(s): VITAMINB12, FOLATE, FERRITIN, TIBC, IRON, RETICCTPCT in the last 72 hours. Sepsis Labs: Recent Labs  Lab 10/06/20 0251 10/08/20 0302 10/09/20 0251 10/10/20 0233 10/10/20 1225  PROCALCITON  --   --   --   --  2.60  WBC 18.1* 28.4* 31.6* 21.7*  --   LATICACIDVEN  --   --   --   --  1.5    Microbiology  Recent Results (from the past 240 hour(s))  Urine Culture     Status: Abnormal   Collection Time: 10/01/20 10:13 AM   Specimen: Urine, Catheterized  Result Value Ref Range Status   Specimen Description   Final    URINE, CATHETERIZED Performed at [facility], [address] [place], [place] [address]    Special Requests   Final    NONE Performed at [facility], [address] [place], [place] [address]    Culture 40,000 COLONIES/mL YEAST (A)  Final   Report Status 10/02/2020 FINAL  Final    Procedures and diagnostic studies:  DG Chest Port 1 View  Result Date: 10/10/2020 CLINICAL DATA:  Respiratory failure. EXAM: PORTABLE CHEST 1 VIEW COMPARISON:  Radiograph 10/04/2020, lung bases from abdominal CT 10/08/2020 FINDINGS: Enteric tube tip below the diaphragm not included in the field of view. Stable heart size and mediastinal contours. Aortic atherosclerosis. Patchy bibasilar opacities without significant interval change. There is mild background bronchial thickening. Opacity abutting the left costophrenic angle represents a prominent epicardial fat pad on CT. Mild peribronchial thickening. No pneumothorax. Stable osseous structures. IMPRESSION: Unchanged patchy bibasilar opacities, atelectasis versus pneumonia. Electronically Signed   By: Narda Rutherford M.D.   On: 10/10/2020 00:37   DG Abd Portable  1V  Result Date: 10/10/2020 CLINICAL DATA:  Constipation. EXAM: PORTABLE ABDOMEN - 1 VIEW COMPARISON:  10/09/2020 FINDINGS: An enteric tube remains in place terminating over the stomach. A large amount of stool remains in the right colon. Small bowel dilatation is unchanged. Oral contrast material remains in dilated small bowel loops without passage to the colon. IMPRESSION: Unchanged small bowel dilatation without progression of oral contrast material. Electronically Signed   By: Sebastian Ache M.D.   On: 10/10/2020 08:05   DG Abd Portable 1V  Result Date: 10/09/2020 CLINICAL DATA:  Ileus EXAM: PORTABLE ABDOMEN - 1 VIEW COMPARISON:  the previous day's study FINDINGS: Nasogastric tube into the nondilated stomach. There are multiple distended and dilated small bowel loops throughout the abdomen. The degree of abdominal dilatation appears slightly increased since previous exam. No convincing passage of oral contrast into the colon. There is a large amount fecal material proximal colon which appears decompressed distally. IMPRESSION: Small bowel dilatation without contrast progression into the stool-distended proximal colon suggesting fecal obstipation. Electronically Signed   By: Corlis Leak M.D.   On: 10/09/2020 07:45   DG Abd Portable 1V  Result Date: 10/08/2020 CLINICAL DATA:  Nasogastric tube placement. EXAM: PORTABLE ABDOMEN - 1 VIEW COMPARISON:  CT earlier today. FINDINGS: Tip and side port of the enteric tube below the diaphragm in the stomach. Retained contrast within small bowel in the central abdomen, partially included in the field of view. Mild colonic distension with air and stool, also partially included. No evidence of free air. IMPRESSION: Tip and side port of the enteric tube below the diaphragm in the stomach. Electronically Signed   By: Narda Rutherford M.D.   On: 10/08/2020 17:52    Medications:    arformoterol  15 mcg Nebulization BID   budesonide (PULMICORT) nebulizer solution  0.5 mg  Nebulization BID   chlorhexidine  15 mL Mouth Rinse BID   Chlorhexidine Gluconate Cloth  6 each Topical Daily   Gerhardt's butt cream   Topical TID   guaiFENesin  15 mL Per Tube QID   heparin injection (subcutaneous)  5,000 Units Subcutaneous Q8H   insulin aspart  0-9 Units Subcutaneous TID WC  mouth rinse  15 mL Mouth Rinse q12n4p   polyethylene glycol  17 g Per Tube TID   revefenacin  175 mcg Nebulization Daily   Continuous Infusions:  sodium chloride Stopped (09/29/20 1824)   sodium chloride 50 mL/hr at 10/10/20 1100   sodium chloride Stopped (10/10/20 0309)   amiodarone 60 mg/hr (10/10/20 1229)   Followed by   amiodarone     phenylephrine (NEO-SYNEPHRINE) Adult infusion 45 mcg/min (10/10/20 1100)     LOS: 18 days   Joseph ArtJessica U Zebediah Beezley  [facility]   How to contact the [facility] Attending or Consulting provider 7A - 7P or covering provider during after hours 7P -7A, for this patient?  Check the care team in [facility] and look for a) attending/consulting [facility] provider listed and b) the [facility] team listed Log into [email or web address] and use [place]'s universal password to access. If you do not have the password, please contact the hospital operator. Locate the [facility] provider you are looking for under [facility] and page to a number that you can be directly reached. If you still have difficulty reaching the provider, please page the [facility] (Director on Call) for the Hospitalists listed on amion for assistance.  10/10/2020, 2:03 PM

## 2020-10-10 NOTE — Progress Notes (Signed)
Pharmacy Antibiotic Note  Kimberly Ballard is a 80 y.o. female admitted on 09/22/2020 with sepsis.  Pharmacy has been consulted for vancomycin and cefepime dosing.  Plan: Cefepime 2 gr IV q24h  Vancomycin 1500 mg IV x1. Will dose subsequently with levels and Scr trending upwards  Monitor clinical course, renal function, cultures as available   Height: 5\' 3"  (160 cm) Weight: 84.8 kg (186 lb 15.2 oz) IBW/kg (Calculated) : 52.4  Temp (24hrs), Avg:97.9 F (36.6 C), Min:97.1 F (36.2 C), Max:98.5 F (36.9 C)  Recent Labs  Lab 10/05/20 0304 10/06/20 0251 10/07/20 0253 10/08/20 0302 10/09/20 0251 10/10/20 0233 10/10/20 1225  WBC 21.4* 18.1*  --  28.4* 31.6* 21.7*  --   CREATININE 0.60 0.66 0.78 1.14* 1.49* 2.47*  --   LATICACIDVEN  --   --   --   --   --   --  1.5    Estimated Creatinine Clearance: 19.1 mL/min (A) (by C-G formula based on SCr of 2.47 mg/dL (H)).    No Known Allergies  8/22 azithromycin >> 8/25 8/22 CTX >> 8/29 9/2 Fluconazole >> 9/9 9/9 vancomycin >>  9/9 cefepime >>   8/22 BCx: ngf 8/31 UCx: cxt reincubated.  40k yeast  Thank you for allowing pharmacy to be a part of this patient's care.     9/31, PharmD, BCPS 10/10/2020 6:37 PM

## 2020-10-10 NOTE — Progress Notes (Signed)
NAME:  Kimberly Ballard, MRN:  [id number], DOB:  March 01, 1940, LOS: 18 ADMISSION DATE:  09/22/2020, CONSULTATION DATE:  09/23/2020 REFERRING MD:  Adela Glimpse - [facility] CHIEF COMPLAINT:  Dyspnea, cough   History of Present Illness:  80 year old woman presented to [facility] ED 8/22 with dyspnea, body aches, cough after a significant COVID exposure. PMHx significant for AF (on Eliquis), COPD and former tobacco abuse.  Per patient, her daughter was diagnosed with COVID and patient developed symptoms ~8/16.  She discussed concerns with her PCP and paxlovid was prescribed 8/18.  At [facility], she was tested for COVID twice (both negative PCRs) and IgG antibody was also negative. Per PCP notes, patient did receive the primary series of the COVID vaccine as well as 2 subsequent boosters.    Pertinent Medical History:  COPD Atrial fibrillation Former cigarette smoker  Significant Hospital Events: Including procedures, antibiotic start and stop dates in addition to other pertinent events   8/22 - Admit to SDU.  Cough weak.  Pulmonary asked to evaluate given concern for progressive respiratory failure.  COVID-19 and influenza negative. Respiratory viral panel negative.   IV ceftriaxone and azithromycin initiated.  Given IV Solu-Medrol followed by oral prednisone order. 8/23 - Pulmonary asked to evaluate given worsening respiratory failure.  CT chest showing lower lobe consolidation.  There was concern about possible aspiration.  Echo obtained showed good left ventricular function with mild pulmonary artery hypertension. RUQ ultrasound showed hepatic steatosis, possibly cirrhosis based on nodular liver contour, no gallbladder 8/24 - Cardiology consulted for elevated troponins.  This was felt secondary to worsening hypoxia and underlying pulmonary artery hypertension which was felt previously undiagnosed issue.  New onset atrial fibrillation with RVR.  CT angiogram ordered.  Increased oxygen requirement overnight, requiring BiPAP,  later placed on 15 L/min via high flow.  Lower extremity ultrasound negative for DVT 8/25 - Urine strep neg.  Still high FIO2. F/u COVID neg. Transitioned to oral CCB and DOAC. Getting IV lasix. Transitioned to headed High flow [place] during day and BIPAP at night.  8/26 - Still w/ overall net positive fluid balance over hospital stay. CXR w/ worse edema. Lasix increased to 80 mg q 12. Azithromycin stopped (day 5) Steroids reduced.  Cardiology signed off. (See note from cards w/ plan to follow as out-pt)  8/27 - Diuresed, CXR better, eating, got up to chair 8/28 -9/1 Remains on FIO2 80% despite aggressive diuresis. Net neg 11L. 8/31 - NO BUBBLE SHUNT on echo 9/02 - Ongoing High FiO2 requirements. Net -13.7L/admission (-1.6L/24H). Tolerating HFNC/BiPAP and nebulization treatments. New hematuria for which Urology consulted, started CBI. 9/3 - Slight improvement in FiO2 9/4 - PCCM signed off 9/8 - Remains on HHFNC 25L/45% FiO2, pursed-lip breathing. NGT in place for ileus.Reports that her breathing feels more difficult since earlier today. Pursed-lip breathing on exam. Remains on HHFNC 25LPM/45% FiO2. Sats 87-94% today. Belly feels bloated, passing some gas but minimal Bms. NGT remains in place - CCS managing. Mildly TTP over RLQ  Interim History / Subjective:   10/10/2020 - 35%  L HHFNC, 60% fio2 and worse. Ileus ongoing. On Neo since last night.  Last lopressor and cardizem last night. This morning a Fib RVR and amio gtt started. Palliative care consult   Objective   Blood pressure 94/74, pulse (!) 27, temperature (!) 97.1 F (36.2 C), temperature source Axillary, resp. rate (!) 34, height 5\' 3"  (1.6 m), weight 84.8 kg, SpO2 94 %.    FiO2 (%):  [45 %-60 %]  60 %   Intake/Output Summary (Last 24 hours) at 10/10/2020 1123 Last data filed at 10/10/2020 0800 Gross per 24 hour  Intake 1408.43 ml  Output 2125 ml  Net -716.57 ml   Filed Weights   10/08/20 0500 10/09/20 0320 10/10/20 0500  Weight: 84.5 kg  84.8 kg 84.8 kg   Physical Examination: Obese. In bed. Sleepy. On HHFNC. Has NG tube. Abd distended - no scar . Soft. CTA bilaterally. A Fifb RVR    Resolved Hospital Problem List:    Assessment & Plan:  Acute hypoxemic/hypercarbic respiratory failure Suspect more chronic in nature given well-compensated respiratory acidosis as well as O2 sat 88% (2019).  In addition, she has polycythemia (likely in the setting of chronic hypoxemia).  She has never been on HOT. Now worsened with bilateral nodular infiltrates as well as atelectasis.  Suspect shunt physiology, not well-compensated. She has been treated with antibiotics as well as steroid taper without much improvement, though this may be gradual over time.  Some concern for COVID pneumonitis, although she has been COVID tested and negative x 2 despite close contacts with positive COVID tests.  She has equivocal platypnea on exam. Orthodeoxia not well-established, but does seem to have higher sats at times when more supine.  Some concern for cirrhosis, which would put her at risk for hepatopulmonary syndrome. Also suspect component of abdominal bloating (2/2 ileus/constipation) is contributing to her subjective SOB.  Plan - Goal O2 sat 88-92% - Ongoing diuresis as tolerated (per Cardiology) - Increase mobility as able (OOB, PT/OT) - Bronchodilators as ordered - Pulmonary hygiene with IS, increased mobility - Eventual rehab when clinically able - BiPAP QHS   COPD Based on PFTs 2019.  Physiologically quite severe with what appears to be chronic hypoxemic respiratory failure, although not on chronic HOT. S/p steroid taper.  Plan - Continue bronchodilators  Severe constipation, ileus vs. 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 No biopsy. No established diagnosis of portal hypertension. Would put her at risk of potential hepatopulmonary syndrome, although this is not well-established in this patient.  Plan - Outpatient evaluation/follow up as appropriate  Best Practice: (right click and "Reselect all SmartList Selections" daily)   Per Primary Team - overall declining. No improvement. Goals of care appopriate   ATTESTATION & SIGNATURE   The patient Herschell Dimesndrea J Rashad is critically ill with multiple organ systems failure and requires high complexity decision making for assessment and support, frequent evaluation and titration of therapies, application of advanced monitoring technologies and extensive interpretation of multiple databases.   Critical Care Time devoted to patient care services described in this note is  40  Minutes. This time reflects time of care of this signee Dr Kalman ShanMurali Lamarco Gudiel. This critical care time does not reflect procedure time, or teaching time or supervisory time of PA/NP/Med student/Med Resident etc but could involve care discussion time     Dr. Kalman ShanMurali Tevon Berhane, M.D., [facility] Hospital[facility].C.P Pulmonary and Critical Care Medicine Staff Physician [facility] [facility] Pager: [phone]  [phone], If no answer or between  15:00h - 7:00h: call [phone]  10/10/2020 11:52 AM     LABS    PULMONARY No results for input(s): PHART, PCO2ART, PO2ART, HCO3, TCO2, O2SAT in the last 168 hours.  Invalid input(s): PCO2, PO2  CBC Recent Labs  Lab 10/08/20 0302 10/09/20 0251 10/10/20 0233  HGB 17.7* 17.9* 16.8*  HCT 51.1* 52.0* 48.4*  WBC 28.4* 31.6* 21.7*  PLT 311 329 329    COAGULATION No results for input(s): INR in the last 168 hours.  CARDIAC  No results for input(s): TROPONINI in the last 168  hours. No results for input(s): PROBNP in the last 168 hours.   CHEMISTRY Recent Labs  Lab 10/05/20 0304 10/06/20 0251 10/07/20 0253 10/08/20 0302 10/09/20 0251 10/10/20 0233  NA 132* 132* 128* 131* 133* 133*  K 3.5 3.4* 3.2* 4.9 4.8 4.5  CL 80* 79* 80* 83* 85* 84*  CO2 43* 41* 33* 33* 32 31  GLUCOSE 117* 115* 149* 130* 125* 135*  BUN 40* 36* 44* 61* 82* 110*  CREATININE 0.60 0.66 0.78 1.14* 1.49* 2.47*  CALCIUM 8.4* 8.4* 8.6* 8.7* 9.1 8.5*  MG 2.2  --   --   --   --  3.3*  PHOS 3.5  --   --   --   --   --    Estimated Creatinine Clearance: 19.1 mL/min (A) (by C-G formula based on SCr of 2.47 mg/dL (H)).   LIVER Recent Labs  Lab 10/05/20 0304 10/08/20 0302 10/09/20 0251  AST 39 70* 47*  ALT 83* 124* 95*  ALKPHOS 59 69 78  BILITOT 2.0* 1.6* 1.8*  PROT 5.7* 6.4* 6.5  ALBUMIN 2.7* 3.0* 2.9*     INFECTIOUS No results for input(s): LATICACIDVEN, PROCALCITON in the last 168 hours.   ENDOCRINE CBG (last 3)  Recent Labs    10/09/20 1558 10/09/20 2208 10/10/20 0816  GLUCAP 195* 167* 128*         IMAGING x48h  - image(s) personally visualized  -   highlighted in bold CT ABDOMEN PELVIS W CONTRAST  Result Date: 10/08/2020 CLINICAL DATA:  Abdominal distention and discomfort. EXAM: CT ABDOMEN AND PELVIS WITH CONTRAST TECHNIQUE: Multidetector CT imaging of the abdomen and pelvis was performed using the standard protocol following bolus administration of intravenous contrast. CONTRAST:  19mL OMNIPAQUE IOHEXOL 350 MG/ML SOLN COMPARISON:  09/30/2020 FINDINGS: Lower chest: Nodular and consolidative opacities are seen in the lung bases bilaterally. Hepatobiliary: No suspicious focal abnormality within the liver parenchyma. Gallbladder is surgically absent. No intrahepatic or extrahepatic biliary dilation. Pancreas: No focal mass lesion. No dilatation of the main duct. No intraparenchymal cyst. No peripancreatic edema. Spleen: No splenomegaly. No focal mass lesion.  Adrenals/Urinary Tract: No adrenal nodule or mass. Tiny hypodensity upper pole left kidney is too small to characterize but likely benign. Right kidney unremarkable. No evidence for hydroureter. Bladder decompressed by Foley catheter. Stomach/Bowel: NG tube tip is in the proximal duodenum. Proximal small bowel loops are distended up to 3.1 cm diameter. Distal small bowel is less distended. Large stool volume noted in the distended right and transverse colon with gradual tapering of the colon through the descending and sigmoid segments. There is some minimal pericolonic edema around the ascending colon. 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 Vascular/Lymphatic: There is moderate atherosclerotic calcification of the abdominal aorta without aneurysm. There is no gastrohepatic or hepatoduodenal ligament lymphadenopathy. No retroperitoneal or mesenteric lymphadenopathy. Reproductive: No pelvic sidewall  lymphadenopathy. Other: There is some trace fluid in the right paracolic gutter in right pelvis. Musculoskeletal: No worrisome lytic or sclerotic osseous abnormality. IMPRESSION: 1. Large stool volume in the distended right and transverse colon with gradual tapering of the colon through the descending and sigmoid segments. Imaging features are nonspecific although there is some pericolonic edema on the right with trace fluid in the right paracolic gutter. Component of colitis not excluded. 2. Proximal small bowel loops are distended up to 3.1 cm diameter. Distal small bowel is less distended. No findings to suggest overt mechanical small-bowel obstruction. 3. Nodular and consolidative opacities in the lung bases bilaterally. Imaging features are compatible with multifocal pneumonia. 4. Trace fluid in the right paracolic gutter and right pelvis. 5. Aortic Atherosclerosis (ICD10-I70.0). Electronically Signed   By: Kennith Center M.D.   On: 10/08/2020 14:43   DG Chest Port 1 View  Result Date:  10/10/2020 CLINICAL DATA:  Respiratory failure. EXAM: PORTABLE CHEST 1 VIEW COMPARISON:  Radiograph 10/04/2020, lung bases from abdominal CT 10/08/2020 FINDINGS: Enteric tube tip below the diaphragm not included in the field of view. Stable heart size and mediastinal contours. Aortic atherosclerosis. Patchy bibasilar opacities without significant interval change. There is mild background bronchial thickening. Opacity abutting the left costophrenic angle represents a prominent epicardial fat pad on CT. Mild peribronchial thickening. No pneumothorax. Stable osseous structures. IMPRESSION: Unchanged patchy bibasilar opacities, atelectasis versus pneumonia. Electronically Signed   By: Narda Rutherford M.D.   On: 10/10/2020 00:37   DG Abd Portable 1V  Result Date: 10/10/2020 CLINICAL DATA:  Constipation. EXAM: PORTABLE ABDOMEN - 1 VIEW COMPARISON:  10/09/2020 FINDINGS: An enteric tube remains in place terminating over the stomach. A large amount of stool remains in the right colon. Small bowel dilatation is unchanged. Oral contrast material remains in dilated small bowel loops without passage to the colon. IMPRESSION: Unchanged small bowel dilatation without progression of oral contrast material. Electronically Signed   By: Sebastian Ache M.D.   On: 10/10/2020 08:05   DG Abd Portable 1V  Result Date: 10/09/2020 CLINICAL DATA:  Ileus EXAM: PORTABLE ABDOMEN - 1 VIEW COMPARISON:  the previous day's study FINDINGS: Nasogastric tube into the nondilated stomach. There are multiple distended and dilated small bowel loops throughout the abdomen. The degree of abdominal dilatation appears slightly increased since previous exam. No convincing passage of oral contrast into the colon. There is a large amount fecal material proximal colon which appears decompressed distally. IMPRESSION: Small bowel dilatation without contrast progression into the stool-distended proximal colon suggesting fecal obstipation. Electronically Signed    By: Corlis Leak M.D.   On: 10/09/2020 07:45   DG Abd Portable 1V  Result Date: 10/08/2020 CLINICAL DATA:  Nasogastric tube placement. EXAM: PORTABLE ABDOMEN - 1 VIEW COMPARISON:  CT earlier today. FINDINGS: Tip and side port of the enteric tube below the diaphragm in the stomach. Retained contrast within small bowel in the central abdomen, partially included in the field of view. Mild colonic distension with air and stool, also partially included. No evidence of free air. IMPRESSION: Tip and side port of the enteric tube below the diaphragm in the stomach. Electronically Signed   By: Narda Rutherford M.D.   On: 10/08/2020 17:52

## 2020-10-10 NOTE — Progress Notes (Signed)
Progress Note  Patient Name: Kimberly Ballard Date of Encounter: 10/10/2020  [facility] Cardiologist: Armanda Magic, MD   Subjective   Still having abdominal pain; varies from discussion from providers.  Has had hypotension that has limited use of AV nodal agents.  Has had worsening RVR.  Inpatient Medications    Scheduled Meds:  arformoterol  15 mcg Nebulization BID   bisacodyl  10 mg Rectal Once   budesonide (PULMICORT) nebulizer solution  0.5 mg Nebulization BID   chlorhexidine  15 mL Mouth Rinse BID   Chlorhexidine Gluconate Cloth  6 each Topical Daily   Gerhardt's butt cream   Topical TID   guaiFENesin  15 mL Per Tube QID   heparin injection (subcutaneous)  5,000 Units Subcutaneous Q8H   insulin aspart  0-9 Units Subcutaneous TID WC   mouth rinse  15 mL Mouth Rinse q12n4p   metoprolol tartrate  50 mg Per Tube Q6H   polyethylene glycol  17 g Per Tube TID   revefenacin  175 mcg Nebulization Daily   Continuous Infusions:  sodium chloride Stopped (09/29/20 1824)   sodium chloride 50 mL/hr at 10/10/20 0700   sodium chloride Stopped (10/10/20 0309)   amiodarone 60 mg/hr (10/10/20 1047)   Followed by   amiodarone     diltiazem (CARDIZEM) infusion Stopped (10/09/20 1908)   phenylephrine (NEO-SYNEPHRINE) Adult infusion 45 mcg/min (10/10/20 0923)   PRN Meds: sodium chloride, levalbuterol, metoprolol tartrate, oxybutynin, simethicone, sodium chloride   Vital Signs    Vitals:   10/10/20 0749 10/10/20 0750 10/10/20 0800 10/10/20 0900  BP:   94/74   Pulse:   79 (!) 27  Resp:   20 (!) 34  Temp:   (!) 97.1 F (36.2 C)   TempSrc:   Axillary   SpO2: 92% 93% 92% 94%  Weight:      Height:        Intake/Output Summary (Last 24 hours) at 10/10/2020 1119 Last data filed at 10/10/2020 0800 Gross per 24 hour  Intake 1408.43 ml  Output 2125 ml  Net -716.57 ml   Last 3 Weights 10/10/2020 10/09/2020 10/08/2020  Weight (lbs) 186 lb 15.2 oz 186 lb 15.2 oz 186 lb 4.6 oz  Weight (kg)  84.8 kg 84.8 kg 84.5 kg      Telemetry    Atrial fibrillation RVR. - Personally Reviewed  ECG    No new - Personally Reviewed  Physical Exam   GEN: No acute distress.   Neck: No JVD. Cardiac: Tachycardic with irregularly irregular rhythm. No murmurs, rubs, or gallops.  Respiratory: No increase WOB. Decreased breath sounds bilaterally GI: Soft, distended, slightly tender; NG tube in place. MS: Trace bilateral lower extremity edema. No deformity. Skin: Warm and dry. Neuro:  No focal deficits. Psych: Normal affect. Responds appropriately.  Labs    High Sensitivity Troponin:   Recent Labs  Lab 09/22/20 2148 09/23/20 0207 09/24/20 0239 10/04/20 1721 10/04/20 1923  TROPONINIHS 128* 100* 52* 59* 67*      Chemistry Recent Labs  Lab 10/05/20 0304 10/06/20 0251 10/08/20 0302 10/09/20 0251 10/10/20 0233  NA 132*   < > 131* 133* 133*  K 3.5   < > 4.9 4.8 4.5  CL 80*   < > 83* 85* 84*  CO2 43*   < > 33* 32 31  GLUCOSE 117*   < > 130* 125* 135*  BUN 40*   < > 61* 82* 110*  CREATININE 0.60   < > 1.14*  1.49* 2.47*  CALCIUM 8.4*   < > 8.7* 9.1 8.5*  PROT 5.7*  --  6.4* 6.5  --   ALBUMIN 2.7*  --  3.0* 2.9*  --   AST 39  --  70* 47*  --   ALT 83*  --  124* 95*  --   ALKPHOS 59  --  69 78  --   BILITOT 2.0*  --  1.6* 1.8*  --   GFRNONAA >60   < > 49* 36* 19*  ANIONGAP 9   < > 15 16* 18*   < > = values in this interval not displayed.     Hematology Recent Labs  Lab 10/08/20 0302 10/09/20 0251 10/10/20 0233  WBC 28.4* 31.6* 21.7*  RBC 5.59* 5.60* 5.25*  HGB 17.7* 17.9* 16.8*  HCT 51.1* 52.0* 48.4*  MCV 91.4 92.9 92.2  MCH 31.7 32.0 32.0  MCHC 34.6 34.4 34.7  RDW 13.6 13.8 13.9  PLT 311 329 329    BNP No results for input(s): BNP, PROBNP in the last 168 hours.    DDimer No results for input(s): DDIMER in the last 168 hours.   Radiology    CT ABDOMEN PELVIS W CONTRAST  Result Date: 10/08/2020 CLINICAL DATA:  Abdominal distention and discomfort. EXAM:  CT ABDOMEN AND PELVIS WITH CONTRAST TECHNIQUE: Multidetector CT imaging of the abdomen and pelvis was performed using the standard protocol following bolus administration of intravenous contrast. CONTRAST:  80mL OMNIPAQUE IOHEXOL 350 MG/ML SOLN COMPARISON:  09/30/2020 FINDINGS: Lower chest: Nodular and consolidative opacities are seen in the lung bases bilaterally. Hepatobiliary: No suspicious focal abnormality within the liver parenchyma. Gallbladder is surgically absent. No intrahepatic or extrahepatic biliary dilation. Pancreas: No focal mass lesion. No dilatation of the main duct. No intraparenchymal cyst. No peripancreatic edema. Spleen: No splenomegaly. No focal mass lesion. Adrenals/Urinary Tract: No adrenal nodule or mass. Tiny hypodensity upper pole left kidney is too small to characterize but likely benign. Right kidney unremarkable. No evidence for hydroureter. Bladder decompressed by Foley catheter. Stomach/Bowel: NG tube tip is in the proximal duodenum. Proximal small bowel loops are distended up to 3.1 cm diameter. Distal small bowel is less distended. Large stool volume noted in the distended right and transverse colon with gradual tapering of the colon through the descending and sigmoid segments. There is some minimal pericolonic edema around the ascending colon. 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 Vascular/Lymphatic: There is moderate atherosclerotic calcification of the abdominal aorta without aneurysm. There is no gastrohepatic or hepatoduodenal ligament lymphadenopathy. No retroperitoneal or mesenteric lymphadenopathy. Reproductive: No pelvic sidewall lymphadenopathy. Other: There is some trace fluid in the right paracolic gutter in right pelvis. Musculoskeletal: No worrisome lytic or sclerotic osseous abnormality. IMPRESSION: 1. Large stool volume in the distended right and transverse colon with gradual tapering of the colon through the descending and sigmoid  segments. Imaging features are nonspecific although there is some pericolonic edema on the right with trace fluid in the right paracolic gutter. Component of colitis not excluded. 2. Proximal small bowel loops are distended up to 3.1 cm diameter. Distal small bowel is less distended. No findings to suggest overt mechanical small-bowel obstruction. 3. Nodular and consolidative opacities in the lung bases bilaterally. Imaging features are compatible with multifocal pneumonia. 4. Trace fluid in the right paracolic gutter and right pelvis. 5. Aortic Atherosclerosis (ICD10-I70.0). Electronically Signed   By: Kennith CenterEric  Mansell M.D.   On: 10/08/2020 14:43   [facility]  Result  Date: 10/10/2020 CLINICAL DATA:  Respiratory failure. EXAM: PORTABLE CHEST 1 VIEW COMPARISON:  Radiograph 10/04/2020, lung bases from abdominal CT 10/08/2020 FINDINGS: Enteric tube tip below the diaphragm not included in the field of view. Stable heart size and mediastinal contours. Aortic atherosclerosis. Patchy bibasilar opacities without significant interval change. There is mild background bronchial thickening. Opacity abutting the left costophrenic angle represents a prominent epicardial fat pad on CT. Mild peribronchial thickening. No pneumothorax. Stable osseous structures. IMPRESSION: Unchanged patchy bibasilar opacities, atelectasis versus pneumonia. Electronically Signed   By: Narda Rutherford M.D.   On: 10/10/2020 00:37   DG Abd Portable 1V  Result Date: 10/10/2020 CLINICAL DATA:  Constipation. EXAM: PORTABLE ABDOMEN - 1 VIEW COMPARISON:  10/09/2020 FINDINGS: An enteric tube remains in place terminating over the stomach. A large amount of stool remains in the right colon. Small bowel dilatation is unchanged. Oral contrast material remains in dilated small bowel loops without passage to the colon. IMPRESSION: Unchanged small bowel dilatation without progression of oral contrast material. Electronically Signed   By: Sebastian Ache  M.D.   On: 10/10/2020 08:05   DG Abd Portable 1V  Result Date: 10/09/2020 CLINICAL DATA:  Ileus EXAM: PORTABLE ABDOMEN - 1 VIEW COMPARISON:  the previous day's study FINDINGS: Nasogastric tube into the nondilated stomach. There are multiple distended and dilated small bowel loops throughout the abdomen. The degree of abdominal dilatation appears slightly increased since previous exam. No convincing passage of oral contrast into the colon. There is a large amount fecal material proximal colon which appears decompressed distally. IMPRESSION: Small bowel dilatation without contrast progression into the stool-distended proximal colon suggesting fecal obstipation. Electronically Signed   By: Corlis Leak M.D.   On: 10/09/2020 07:45   DG Abd Portable 1V  Result Date: 10/08/2020 CLINICAL DATA:  Nasogastric tube placement. EXAM: PORTABLE ABDOMEN - 1 VIEW COMPARISON:  CT earlier today. FINDINGS: Tip and side port of the enteric tube below the diaphragm in the stomach. Retained contrast within small bowel in the central abdomen, partially included in the field of view. Mild colonic distension with air and stool, also partially included. No evidence of free air. IMPRESSION: Tip and side port of the enteric tube below the diaphragm in the stomach. Electronically Signed   By: Narda Rutherford M.D.   On: 10/08/2020 17:52    Cardiac Studies   Complete Echo 09/23/2020: Impressions:  1. Left ventricular ejection fraction, by estimation, is 55 to 60%. Left  ventricular ejection fraction by 3D volume is 59 %. The left ventricle has  normal function. The left ventricle has no regional wall motion  abnormalities. There is moderate left  ventricular hypertrophy. Left ventricular diastolic parameters are  consistent with Grade I diastolic dysfunction (impaired relaxation).   2. Right ventricular systolic function mildly reduced at base and mid,  with relative preserved apical systolic function. The right ventricular  size  is moderately enlarged. There is moderately elevated pulmonary artery  systolic pressure. The estimated  right ventricular systolic pressure is 48.4 mmHg.   3. Left atrial size was mildly dilated.   4. Right atrial size was moderately dilated.   5. The mitral valve is normal in structure. Trivial mitral valve  regurgitation. No evidence of mitral stenosis.   6. Tricuspid valve regurgitation is mild to moderate.   7. The aortic valve is normal in structure. Aortic valve regurgitation is  not visualized. No aortic stenosis is present.   8. The inferior vena cava is dilated in size  with <50% respiratory  variability, suggesting right atrial pressure of 15 mmHg.  _______________   Limited Echo with Bubble Study 10/01/2020: Impressions: 1. Left ventricular ejection fraction, by estimation, is 60 to 65%. The  left ventricle has normal function. There is moderate concentric left  ventricular hypertrophy. Left ventricular diastolic function could not be  evaluated.   2. The mitral valve is grossly normal. Trivial mitral valve  regurgitation. No evidence of mitral stenosis.   3. The aortic valve is tricuspid. There is mild calcification of the  aortic valve. Aortic valve regurgitation is trivial. No aortic stenosis is  present.   4. Agitated saline contrast bubble study was negative, with no evidence  of any interatrial shunt.   Conclusion(s)/Recommendation(s): Limited study, no major change from  recent study 09/23/20. Bubble study negative for intraatrial shunt.   Patient Profile     80 y.o. female with a history of COPD with prior tobacco use (quit in 2019) who was admitted on 09/22/2020 for acute hypoxic respiratory failure secondary to COPD exacerbation and multifocal pneumonia after presenting with shortness of breath, body aches, cough, and decreased appetite. Negative for COVID this admission. Cardiology consulted for elevated troponin. Did alter develop atrial fibrillation with RVR and  was started on Cardizem and Eliquis with control of rates but not restoration of sinus rhythm Cardiology signed off on 09/26/2020 with plans to have patient follow-up in the A. Fib [facility] and likely DCCV after 3-4 weeks of anticoagulation. We were reconsulted on 10/01/2020 given recurrent RVR.  Assessment & Plan    New Onset Atrial Fibrillation with RVR Hypotension Improving Transaminitis - In the setting of acute hypoxic respiratory failure secondary to COPD exacerbation and pneumonia.  - requiring vasopressors for hypotension and not tolerating AV nodal agents - there are risk for use of amiodarone given transaminitis and lung disease; but patient would not likely tolerate other therapy.  Ileus is also contributing to RVR - transitioned to IV amiodarone - discussed risks with patient and primary MD - when able anticoagulation would be reasonable -overall guarded prognosis.   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 Peaked at 128.  - Troponin elevation not felt to be ACS and more consistent with demand. She does have coronary calcification on CT so may benefit from outpatient Lexiscan if she recovers from acute illness.     Otherwise, per primary team: - Acute hypoxic/hypercapnic respiratory failure requiring intermittent BiPAP - COPD Exacerbation - Multifocal pneumonia  - Abdominal pain - Transamnitis  - Hyponatremia - Hematuria: Started on CBI. Urology consulted. - Ileus: S/p NGT placement. General Surgery and GI consulted.  CRITICAL CARE Performed by: Heleena Miceli A Taraji Mungo  Total critical care time: 30 minutes. Critical care time was exclusive of separately billable procedures and treating other patients. Critical care was necessary to treat or  prevent imminent or life-threatening deterioration. Critical care was time spent personally by me on the following activities: development of treatment plan with patient and/or surrogate as well as nursing, discussions with consultants, evaluation of patient's response to treatment, examination of patient, obtaining history from patient or surrogate, ordering and performing treatments and interventions, ordering and review of laboratory studies, ordering and review of radiographic studies, pulse oximetry and re-evaluation of patient's condition.    Signed, Riley Lam, MD [facility]  10/10/2020 11:24 AM     For questions or updates, please contact [facility] Please consult [email or web address] for contact info under        Signed, Christell Constant, MD  10/10/2020, 11:19 AM

## 2020-10-10 NOTE — Progress Notes (Signed)
Date and time results received: 10/10/20 1340 (use smartphrase ".now" to insert current time)  Test: Troponin Critical Value: 141  Name of Provider Notified: Benjamine Mola, DO  Orders Received? Or Actions Taken?: None at this time.

## 2020-10-10 NOTE — Progress Notes (Signed)
Initial Nutrition Assessment  DOCUMENTATION CODES:   Obesity unspecified  INTERVENTION:  - diet advancement as medically feasible.  - will monitor for possible need for nutrition support (TPN).  NUTRITION DIAGNOSIS:   Inadequate oral intake related to inability to eat as evidenced by NPO status.   GOAL:   Patient will meet greater than or equal to 90% of their needs  MONITOR:   Diet advancement, Labs, Weight trends, Skin  REASON FOR ASSESSMENT:   LOS  ASSESSMENT:   80 year old female with medical history of afib, COPD, and former tobacco abuse. She presented to the ED on 8/22 due to dyspnea, body aches, and cough following a COVID-19 exposure.  She has been NPO since 9/6 at 1627. NGT placed in L nare that date. She has 900 ml output since 0400.  Weight today is 187 lb and weight on admission date of 8/22 was 200 lb. The only weight recorded in the chart PTA was 210 lb at [facility] in 12/2019.  She is noted to be -20 L since admission.   Per notes: - severe constipation with concern for ileus vs SBO--clamping and allowing ice chips today - possible colitis - no plan for surgical intervention and patient noted to be a poor surgical candidate - afib with RVR and circulatory shock - fatty liver with possible cirrhosis--plan for output evaluation/follow-up   Labs reviewed; CBGs: 128 x2 mg/dl, Na: 846 mmol/l, Cl: 84 mmol/l, BUN: 110 mg/dl, creatinine: 9.62 mg/dl, Ca: 8.5 mg/dl, Mg: 3.3 mg/dl, GFR: 19 ml/min. Medications reviewed; sliding scale novolog, 17 g miralax TID, SMOG enema done 9/8. IVF; NS @ 50 ml/hr.    NUTRITION - FOCUSED PHYSICAL EXAM:  Completed; no muscle or fat depletions.   Diet Order:   Diet Order             Diet NPO time specified Except for: Ice Chips, Other (See Comments)  Diet effective now                   EDUCATION NEEDS:   Not appropriate for education at this time  Skin:  Skin Assessment: Skin Integrity Issues: Skin Integrity  Issues:: Other (Comment) Other: MASD to bilateral buttocks  Last BM:  9/9 (type 6 x1)  Height:   Ht Readings from Last 1 Encounters:  09/22/20 5\' 3"  (1.6 m)    Weight:   Wt Readings from Last 1 Encounters:  10/10/20 84.8 kg     Estimated Nutritional Needs:  Kcal:  1950-2120 kcal Protein:  95-110 grams Fluid:  >/= 2.1 L/day      12/10/20, MS, RD, LDN, CNSC Inpatient Clinical Dietitian RD pager # available in AMION  After hours/weekend pager # available in [facility]

## 2020-10-10 NOTE — Progress Notes (Signed)
eLink Physician-Brief Progress Note Patient Name: JOYELL EMAMI DOB: 1940-06-21 MRN: [id number]   Date of Service  10/10/2020  HPI/Events of Note  Patient now back in Afib with RVR, BP 112/75, HR 130-140.  eICU Interventions  Lopressor 2.5 mg iv Q 3 hours PRN heart rate > 115 ordered.        Thomasene Lot Lawan Nanez 10/10/2020, 12:27 AM

## 2020-10-10 NOTE — Plan of Care (Signed)
Discussed with patient plan of care for the evening, pain management and rest with some teach back displayed.  Patient wants to get rest since disturbed throughout last night

## 2020-10-10 NOTE — Consult Note (Signed)
Referring Provider: [facility] Primary Care Physician:  System, Provider Not In Primary Gastroenterologist:  Unassigned  Reason for Consultation:  Ileus  HPI: Kimberly Ballard is a 80 y.o. female with past medical history of COPD, A fib with RVR, acute diastolic CHF, currently hospitalized for hypoxemic/hypercapnic respiratory failure presenting for consultation of ileus.  Patient reports abdominal distention which started 2-3 days ago.  She has been having nausea, no emesis since last night.  Reports diffuse abdominal pain.  Denies hematemesis, melena, or hematochezia.  Had a medium mushy BM this morning.  Abdominal x-ray today 9/9: Unchanged small bowel dilatation without progression of oral contrast material.  Abdominal x-ray yesterday 9/8 revealed Small bowel dilatation without contrast progression into the stool-distended proximal colon suggesting fecal obstipation.  CT abdomen/pelvis 9/7: Large stool volume in the distended right and transverse colon with gradual tapering of the colon through the descending and sigmoid segments. Proximal small bowel loops are distended up to 3.1 cm diameter. Distal small bowel is less distended. No findings to suggest overt mechanical small-bowel obstruction.  Past Medical History:  Diagnosis Date   COPD (chronic obstructive pulmonary disease) (HCC)     History reviewed. No pertinent surgical history.  Prior to Admission medications   Medication Sig Start Date End Date Taking? Authorizing Provider  aspirin 81 MG EC tablet Take 81 mg by mouth daily.   Yes [provider]  PAXLOVID, 300/100, 20 x 150 MG & 10 x 100MG  TBPK Take 3 tablets by mouth 2 (two) times daily. 09/18/20  Yes [provider]  TRELEGY ELLIPTA 100-62.5-25 MCG/INH AEPB Inhale 1 puff into the lungs daily. 08/26/20  Yes [provider]    Scheduled Meds:  amiodarone  150 mg Intravenous Once   arformoterol  15 mcg Nebulization BID   budesonide (PULMICORT) nebulizer  solution  0.5 mg Nebulization BID   chlorhexidine  15 mL Mouth Rinse BID   Chlorhexidine Gluconate Cloth  6 each Topical Daily   Gerhardt's butt cream   Topical TID   guaiFENesin  15 mL Per Tube QID   heparin injection (subcutaneous)  5,000 Units Subcutaneous Q8H   insulin aspart  0-9 Units Subcutaneous TID WC   mouth rinse  15 mL Mouth Rinse q12n4p   metoprolol tartrate  50 mg Per Tube Q6H   polyethylene glycol  17 g Per Tube BID   revefenacin  175 mcg Nebulization Daily   Continuous Infusions:  sodium chloride Stopped (09/29/20 1824)   sodium chloride 50 mL/hr at 10/10/20 0700   sodium chloride Stopped (10/10/20 0309)   amiodarone     Followed by   amiodarone     diltiazem (CARDIZEM) infusion Stopped (10/09/20 1908)   phenylephrine (NEO-SYNEPHRINE) Adult infusion 45 mcg/min (10/10/20 0923)   PRN Meds:.sodium chloride, levalbuterol, metoprolol tartrate, oxybutynin, simethicone, sodium chloride  Allergies as of 09/22/2020   (No Known Allergies)    Family History  Problem Relation Age of Onset   Diabetes Neg Hx    CAD Neg Hx     Social History   Socioeconomic History   Marital status: Divorced    Spouse name: Not on file   Number of children: Not on file   Years of education: Not on file   Highest education level: Not on file  Occupational History   Not on file  Tobacco Use   Smoking status: Former    Types: Cigarettes   Smokeless tobacco: Never  Vaping Use   Vaping Use: Never used  Substance and  Sexual Activity   Alcohol use: Yes    Alcohol/week: 2.0 standard drinks    Types: 2 Glasses of wine per week   Drug use: Never   Sexual activity: Not on file  Other Topics Concern   Not on file  Social History Narrative   Not on file   Social Determinants of Health   Financial Resource Strain: Not on file  Food Insecurity: Not on file  Transportation Needs: Not on file  Physical Activity: Not on file  Stress: Not on file  Social Connections: Not on file   Intimate Partner Violence: Not on file    Review of Systems: Review of Systems  Constitutional:  Negative for chills and fever.  HENT:  Negative for hearing loss and tinnitus.   Eyes:  Negative for pain and redness.  Respiratory:  Positive for shortness of breath. Negative for cough.   Cardiovascular:  Negative for chest pain and palpitations.  Gastrointestinal:  Positive for abdominal pain, diarrhea, nausea and vomiting. Negative for blood in stool, constipation, heartburn and melena.  Genitourinary:  Negative for dysuria and flank pain.  Musculoskeletal:  Negative for falls and joint pain.  Skin:  Negative for itching and rash.  Neurological:  Negative for seizures and loss of consciousness.  Endo/Heme/Allergies:  Negative for polydipsia. Does not bruise/bleed easily.  Psychiatric/Behavioral:  Negative for substance abuse. The patient is not nervous/anxious.     Physical Exam: Vital signs: Vitals:   10/10/20 0800 10/10/20 0900  BP: 94/74   Pulse: 79 (!) 27  Resp: 20 (!) 34  Temp: (!) 97.1 F (36.2 C)   SpO2: 92% 94%   Last BM Date: 10/10/20  Physical Exam Vitals reviewed.  Constitutional:      General: She is not in acute distress.    Interventions: Nasal cannula in place.     Comments: NGT in place  HENT:     Head: Normocephalic and atraumatic.     Nose: Nose normal. No congestion.     Mouth/Throat:     Mouth: Mucous membranes are moist.     Pharynx: Oropharynx is clear.  Eyes:     Extraocular Movements: Extraocular movements intact.     Conjunctiva/sclera: Conjunctivae normal.  Cardiovascular:     Rate and Rhythm: Tachycardia present. Rhythm irregular.  Pulmonary:     Effort: Tachypnea present.     Breath sounds: Normal breath sounds.  Abdominal:     General: There is distension.     Palpations: Abdomen is soft. There is no mass.     Tenderness: There is abdominal tenderness (mild, periumbilical). There is no guarding or rebound.     Hernia: No hernia is  present.  Musculoskeletal:     Cervical back: Normal range of motion and neck supple.     Comments: SCDs in place  Skin:    General: Skin is warm and dry.  Neurological:     General: No focal deficit present.     Mental Status: She is oriented to person, place, and time. She is lethargic.  Psychiatric:        Mood and Affect: Mood normal.        Behavior: Behavior normal. Behavior is cooperative.     GI:  Lab Results: Recent Labs    10/08/20 0302 10/09/20 0251 10/10/20 0233  WBC 28.4* 31.6* 21.7*  HGB 17.7* 17.9* 16.8*  HCT 51.1* 52.0* 48.4*  PLT 311 329 329   BMET Recent Labs    10/08/20 0302 10/09/20 0251  10/10/20 0233  NA 131* 133* 133*  K 4.9 4.8 4.5  CL 83* 85* 84*  CO2 33* 32 31  GLUCOSE 130* 125* 135*  BUN 61* 82* 110*  CREATININE 1.14* 1.49* 2.47*  CALCIUM 8.7* 9.1 8.5*   LFT Recent Labs    10/09/20 0251  PROT 6.5  ALBUMIN 2.9*  AST 47*  ALT 95*  ALKPHOS 78  BILITOT 1.8*   PT/INR No results for input(s): LABPROT, INR in the last 72 hours.   Studies/Results: CT ABDOMEN PELVIS W CONTRAST  Result Date: 10/08/2020 CLINICAL DATA:  Abdominal distention and discomfort. EXAM: CT ABDOMEN AND PELVIS WITH CONTRAST TECHNIQUE: Multidetector CT imaging of the abdomen and pelvis was performed using the standard protocol following bolus administration of intravenous contrast. CONTRAST:  21mL OMNIPAQUE IOHEXOL 350 MG/ML SOLN COMPARISON:  09/30/2020 FINDINGS: Lower chest: Nodular and consolidative opacities are seen in the lung bases bilaterally. Hepatobiliary: No suspicious focal abnormality within the liver parenchyma. Gallbladder is surgically absent. No intrahepatic or extrahepatic biliary dilation. Pancreas: No focal mass lesion. No dilatation of the main duct. No intraparenchymal cyst. No peripancreatic edema. Spleen: No splenomegaly. No focal mass lesion. Adrenals/Urinary Tract: No adrenal nodule or mass. Tiny hypodensity upper pole left kidney is too small to  characterize but likely benign. Right kidney unremarkable. No evidence for hydroureter. Bladder decompressed by Foley catheter. Stomach/Bowel: NG tube tip is in the proximal duodenum. Proximal small bowel loops are distended up to 3.1 cm diameter. Distal small bowel is less distended. Large stool volume noted in the distended right and transverse colon with gradual tapering of the colon through the descending and sigmoid segments. There is some minimal pericolonic edema around the ascending colon. 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 Vascular/Lymphatic: There is moderate atherosclerotic calcification of the abdominal aorta without aneurysm. There is no gastrohepatic or hepatoduodenal ligament lymphadenopathy. No retroperitoneal or mesenteric lymphadenopathy. Reproductive: No pelvic sidewall lymphadenopathy. Other: There is some trace fluid in the right paracolic gutter in right pelvis. Musculoskeletal: No worrisome lytic or sclerotic osseous abnormality. IMPRESSION: 1. Large stool volume in the distended right and transverse colon with gradual tapering of the colon through the descending and sigmoid segments. Imaging features are nonspecific although there is some pericolonic edema on the right with trace fluid in the right paracolic gutter. Component of colitis not excluded. 2. Proximal small bowel loops are distended up to 3.1 cm diameter. Distal small bowel is less distended. No findings to suggest overt mechanical small-bowel obstruction. 3. Nodular and consolidative opacities in the lung bases bilaterally. Imaging features are compatible with multifocal pneumonia. 4. Trace fluid in the right paracolic gutter and right pelvis. 5. Aortic Atherosclerosis (ICD10-I70.0). Electronically Signed   By: Kennith Center M.D.   On: 10/08/2020 14:43   DG Chest Port 1 View  Result Date: 10/10/2020 CLINICAL DATA:  Respiratory failure. EXAM: PORTABLE CHEST 1 VIEW COMPARISON:  Radiograph 10/04/2020,  lung bases from abdominal CT 10/08/2020 FINDINGS: Enteric tube tip below the diaphragm not included in the field of view. Stable heart size and mediastinal contours. Aortic atherosclerosis. Patchy bibasilar opacities without significant interval change. There is mild background bronchial thickening. Opacity abutting the left costophrenic angle represents a prominent epicardial fat pad on CT. Mild peribronchial thickening. No pneumothorax. Stable osseous structures. IMPRESSION: Unchanged patchy bibasilar opacities, atelectasis versus pneumonia. Electronically Signed   By: Narda Rutherford M.D.   On: 10/10/2020 00:37   DG Abd Portable 1V  Result Date: 10/10/2020 CLINICAL DATA:  Constipation. EXAM: PORTABLE  ABDOMEN - 1 VIEW COMPARISON:  10/09/2020 FINDINGS: An enteric tube remains in place terminating over the stomach. A large amount of stool remains in the right colon. Small bowel dilatation is unchanged. Oral contrast material remains in dilated small bowel loops without passage to the colon. IMPRESSION: Unchanged small bowel dilatation without progression of oral contrast material. Electronically Signed   By: Sebastian AcheAllen  Grady M.D.   On: 10/10/2020 08:05   DG Abd Portable 1V  Result Date: 10/09/2020 CLINICAL DATA:  Ileus EXAM: PORTABLE ABDOMEN - 1 VIEW COMPARISON:  the previous day's study FINDINGS: Nasogastric tube into the nondilated stomach. There are multiple distended and dilated small bowel loops throughout the abdomen. The degree of abdominal dilatation appears slightly increased since previous exam. No convincing passage of oral contrast into the colon. There is a large amount fecal material proximal colon which appears decompressed distally. IMPRESSION: Small bowel dilatation without contrast progression into the stool-distended proximal colon suggesting fecal obstipation. Electronically Signed   By: Corlis Leak  Hassell M.D.   On: 10/09/2020 07:45   DG Abd Portable 1V  Result Date: 10/08/2020 CLINICAL DATA:   Nasogastric tube placement. EXAM: PORTABLE ABDOMEN - 1 VIEW COMPARISON:  CT earlier today. FINDINGS: Tip and side port of the enteric tube below the diaphragm in the stomach. Retained contrast within small bowel in the central abdomen, partially included in the field of view. Mild colonic distension with air and stool, also partially included. No evidence of free air. IMPRESSION: Tip and side port of the enteric tube below the diaphragm in the stomach. Electronically Signed   By: Narda RutherfordMelanie  Sanford M.D.   On: 10/08/2020 17:52    Impression: Ileus in the setting of hospitalization  -Potassium 4.5 today -Magnesium 3.3  Hypoxemic/hypercapnic respiratory failure   A fib with RVR  Acute diastolic CHF  Plan: Increase Miralax to TID per tube.  Dulcolax suppository x 1.  Continue NGT suction for now.  Continue to maintain potassium >4-4.5 and Magnesium >2.  [facility] will follow.    LOS: 18 days   Edrick KinsAlison Baron-Johnson  PA-C 10/10/2020, 10:33 AM  Contact #  [phone]

## 2020-10-10 NOTE — Progress Notes (Signed)
Progress Note     Subjective: Patient appears tired but reports less abdominal pain this AM. She has had some liquid BMs but still has stool burden present on KUB this AM.   Objective: Vital signs in last 24 hours: Temp:  [97.1 F (36.2 C)-98.7 F (37.1 C)] 97.1 F (36.2 C) (09/09 0800) Pulse Rate:  [27-148] 27 (09/09 0900) Resp:  [18-49] 34 (09/09 0900) BP: (76-143)/(42-102) 94/74 (09/09 0800) SpO2:  [85 %-98 %] 94 % (09/09 0900) FiO2 (%):  [45 %-60 %] 60 % (09/09 0749) Weight:  [84.8 kg] 84.8 kg (09/09 0500) Last BM Date: 10/10/20  Intake/Output from previous day: 09/08 0701 - 09/09 0700 In: 1528.4 [I.V.:1263.4; NG/GT:265] Out: 1975 [Urine:725; Emesis/NG output:1250] Intake/Output this shift: Total I/O In: -  Out: 150 [Urine:150]  PE: General: pleasant, WD, chronically ill appearing female who is laying in bed in NAD Heart: irregularly irregular and tachycardic Lungs: rales in bilateral bases.  on HFNC Abd: soft, NT, less distention, BS hypoactive, NGT with bilious appearing drainage   Lab Results:  Recent Labs    10/09/20 0251 10/10/20 0233  WBC 31.6* 21.7*  HGB 17.9* 16.8*  HCT 52.0* 48.4*  PLT 329 329   BMET Recent Labs    10/09/20 0251 10/10/20 0233  NA 133* 133*  K 4.8 4.5  CL 85* 84*  CO2 32 31  GLUCOSE 125* 135*  BUN 82* 110*  CREATININE 1.49* 2.47*  CALCIUM 9.1 8.5*   PT/INR No results for input(s): LABPROT, INR in the last 72 hours. CMP     Component Value Date/Time   NA 133 (L) 10/10/2020 0233   K 4.5 10/10/2020 0233   CL 84 (L) 10/10/2020 0233   CO2 31 10/10/2020 0233   GLUCOSE 135 (H) 10/10/2020 0233   BUN 110 (H) 10/10/2020 0233   CREATININE 2.47 (H) 10/10/2020 0233   CALCIUM 8.5 (L) 10/10/2020 0233   PROT 6.5 10/09/2020 0251   ALBUMIN 2.9 (L) 10/09/2020 0251   AST 47 (H) 10/09/2020 0251   ALT 95 (H) 10/09/2020 0251   ALKPHOS 78 10/09/2020 0251   BILITOT 1.8 (H) 10/09/2020 0251   GFRNONAA 19 (L) 10/10/2020 0233   GFRAA   09/16/2009 1757    >60        The eGFR has been calculated using the MDRD equation. This calculation has not been validated in all clinical situations. eGFR's persistently <60 mL/min signify possible Chronic Kidney Disease.   Lipase     Component Value Date/Time   LIPASE 54 (H) 09/30/2020 0250       Studies/Results: CT ABDOMEN PELVIS W CONTRAST  Result Date: 10/08/2020 CLINICAL DATA:  Abdominal distention and discomfort. EXAM: CT ABDOMEN AND PELVIS WITH CONTRAST TECHNIQUE: Multidetector CT imaging of the abdomen and pelvis was performed using the standard protocol following bolus administration of intravenous contrast. CONTRAST:  38m OMNIPAQUE IOHEXOL 350 MG/ML SOLN COMPARISON:  09/30/2020 FINDINGS: Lower chest: Nodular and consolidative opacities are seen in the lung bases bilaterally. Hepatobiliary: No suspicious focal abnormality within the liver parenchyma. Gallbladder is surgically absent. No intrahepatic or extrahepatic biliary dilation. Pancreas: No focal mass lesion. No dilatation of the main duct. No intraparenchymal cyst. No peripancreatic edema. Spleen: No splenomegaly. No focal mass lesion. Adrenals/Urinary Tract: No adrenal nodule or mass. Tiny hypodensity upper pole left kidney is too small to characterize but likely benign. Right kidney unremarkable. No evidence for hydroureter. Bladder decompressed by Foley catheter. Stomach/Bowel: NG tube tip is in the proximal duodenum. Proximal  small bowel loops are distended up to 3.1 cm diameter. Distal small bowel is less distended. Large stool volume noted in the distended right and transverse colon with gradual tapering of the colon through the descending and sigmoid segments. There is some minimal pericolonic edema around the ascending colon. 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 Vascular/Lymphatic: There is moderate atherosclerotic calcification of the abdominal aorta without aneurysm. There is no  gastrohepatic or hepatoduodenal ligament lymphadenopathy. No retroperitoneal or mesenteric lymphadenopathy. Reproductive: No pelvic sidewall lymphadenopathy. Other: There is some trace fluid in the right paracolic gutter in right pelvis. Musculoskeletal: No worrisome lytic or sclerotic osseous abnormality. IMPRESSION: 1. Large stool volume in the distended right and transverse colon with gradual tapering of the colon through the descending and sigmoid segments. Imaging features are nonspecific although there is some pericolonic edema on the right with trace fluid in the right paracolic gutter. Component of colitis not excluded. 2. Proximal small bowel loops are distended up to 3.1 cm diameter. Distal small bowel is less distended. No findings to suggest overt mechanical small-bowel obstruction. 3. Nodular and consolidative opacities in the lung bases bilaterally. Imaging features are compatible with multifocal pneumonia. 4. Trace fluid in the right paracolic gutter and right pelvis. 5. Aortic Atherosclerosis (ICD10-I70.0). Electronically Signed   By: Misty Stanley M.D.   On: 10/08/2020 14:43   DG Chest Port 1 View  Result Date: 10/10/2020 CLINICAL DATA:  Respiratory failure. EXAM: PORTABLE CHEST 1 VIEW COMPARISON:  Radiograph 10/04/2020, lung bases from abdominal CT 10/08/2020 FINDINGS: Enteric tube tip below the diaphragm not included in the field of view. Stable heart size and mediastinal contours. Aortic atherosclerosis. Patchy bibasilar opacities without significant interval change. There is mild background bronchial thickening. Opacity abutting the left costophrenic angle represents a prominent epicardial fat pad on CT. Mild peribronchial thickening. No pneumothorax. Stable osseous structures. IMPRESSION: Unchanged patchy bibasilar opacities, atelectasis versus pneumonia. Electronically Signed   By: Keith Rake M.D.   On: 10/10/2020 00:37   DG Abd Portable 1V  Result Date: 10/10/2020 CLINICAL DATA:   Constipation. EXAM: PORTABLE ABDOMEN - 1 VIEW COMPARISON:  10/09/2020 FINDINGS: An enteric tube remains in place terminating over the stomach. A large amount of stool remains in the right colon. Small bowel dilatation is unchanged. Oral contrast material remains in dilated small bowel loops without passage to the colon. IMPRESSION: Unchanged small bowel dilatation without progression of oral contrast material. Electronically Signed   By: Logan Bores M.D.   On: 10/10/2020 08:05   DG Abd Portable 1V  Result Date: 10/09/2020 CLINICAL DATA:  Ileus EXAM: PORTABLE ABDOMEN - 1 VIEW COMPARISON:  the previous day's study FINDINGS: Nasogastric tube into the nondilated stomach. There are multiple distended and dilated small bowel loops throughout the abdomen. The degree of abdominal dilatation appears slightly increased since previous exam. No convincing passage of oral contrast into the colon. There is a large amount fecal material proximal colon which appears decompressed distally. IMPRESSION: Small bowel dilatation without contrast progression into the stool-distended proximal colon suggesting fecal obstipation. Electronically Signed   By: Lucrezia Europe M.D.   On: 10/09/2020 07:45   DG Abd Portable 1V  Result Date: 10/08/2020 CLINICAL DATA:  Nasogastric tube placement. EXAM: PORTABLE ABDOMEN - 1 VIEW COMPARISON:  CT earlier today. FINDINGS: Tip and side port of the enteric tube below the diaphragm in the stomach. Retained contrast within small bowel in the central abdomen, partially included in the field of view. Mild colonic distension with air  and stool, also partially included. No evidence of free air. IMPRESSION: Tip and side port of the enteric tube below the diaphragm in the stomach. Electronically Signed   By: Keith Rake M.D.   On: 10/08/2020 17:52    Anti-infectives: Anti-infectives (From admission, onward)    Start     Dose/Rate Route Frequency Ordered Stop   10/09/20 1000  fluconazole (DIFLUCAN)  tablet 200 mg        200 mg Per Tube Daily 10/09/20 0826 10/10/20 0903   10/08/20 1000  fluconazole (DIFLUCAN) tablet 200 mg  Status:  Discontinued        200 mg Oral Daily 10/07/20 1426 10/09/20 0826   10/03/20 1700  fluconazole (DIFLUCAN) tablet 200 mg  Status:  Discontinued        200 mg Oral Daily 10/03/20 1606 10/07/20 1426   09/23/20 1800  cefTRIAXone (ROCEPHIN) 2 g in sodium chloride 0.9 % 100 mL IVPB        2 g 200 mL/hr over 30 Minutes Intravenous Every 24 hours 09/22/20 1842 09/29/20 1749   09/23/20 1800  azithromycin (ZITHROMAX) 500 mg in sodium chloride 0.9 % 250 mL IVPB  Status:  Discontinued        500 mg 250 mL/hr over 60 Minutes Intravenous Every 24 hours 09/22/20 1842 09/25/20 1456   09/22/20 1630  cefTRIAXone (ROCEPHIN) 1 g in sodium chloride 0.9 % 100 mL IVPB        1 g 200 mL/hr over 30 Minutes Intravenous  Once 09/22/20 1618 09/22/20 1806   09/22/20 1630  azithromycin (ZITHROMAX) 500 mg in sodium chloride 0.9 % 250 mL IVPB        500 mg 250 mL/hr over 60 Minutes Intravenous  Once 09/22/20 1618 09/22/20 1939        Assessment/Plan Severe constipation - NGT placed 9/6 - 1250 cc out in last 24h, ok to clamp and allow ice chips and sips today  - ?some suggestion of possible colitis on CT - likely stercoral  - Has had some BMs but still significant stool burden in R colon on films - gave gastrografin 9/7 to try to get things moving above - also has gotten sorbitol per NGT and SMOG enemas  - patient clinically has a little less pain and abdomen feels a little less distended - no indication for emergent surgical intervention and patient is a poor surgical candidate  - recommend keeping K>4.0 and Mg > 2.0 to optimize bowel function - mobilize as able  - recommend GI consult at this time   FEN: ice chips and sips, IVF per TRH, NGT clamping as tolerated VTE: SCDs ID:[id number] diflucan 9/2>>   COPD with PNA AKI A. Fib with RVR UTI RML pulmonary nodule  LOS: 18 days     Norm Parcel, [facility] 10/10/2020, 11:10 AM Please see Amion for pager number during day hours 7:00am-4:30pm

## 2020-10-10 NOTE — Progress Notes (Signed)
Pt continues to refuse BiPAP qhs while she still has her NG tube in place.  Will again wear HHFNC while sleeping.

## 2020-10-11 DIAGNOSIS — I4891 Unspecified atrial fibrillation: Secondary | ICD-10-CM | POA: Diagnosis not present

## 2020-10-11 DIAGNOSIS — J9621 Acute and chronic respiratory failure with hypoxia: Secondary | ICD-10-CM | POA: Diagnosis not present

## 2020-10-11 DIAGNOSIS — Z7189 Other specified counseling: Secondary | ICD-10-CM

## 2020-10-11 DIAGNOSIS — J189 Pneumonia, unspecified organism: Secondary | ICD-10-CM | POA: Diagnosis not present

## 2020-10-11 DIAGNOSIS — I50810 Right heart failure, unspecified: Secondary | ICD-10-CM | POA: Diagnosis present

## 2020-10-11 DIAGNOSIS — J9601 Acute respiratory failure with hypoxia: Secondary | ICD-10-CM | POA: Diagnosis not present

## 2020-10-11 DIAGNOSIS — Z515 Encounter for palliative care: Secondary | ICD-10-CM

## 2020-10-11 DIAGNOSIS — K567 Ileus, unspecified: Secondary | ICD-10-CM | POA: Diagnosis present

## 2020-10-11 DIAGNOSIS — R0902 Hypoxemia: Secondary | ICD-10-CM | POA: Diagnosis present

## 2020-10-11 DIAGNOSIS — R778 Other specified abnormalities of plasma proteins: Secondary | ICD-10-CM | POA: Diagnosis not present

## 2020-10-11 DIAGNOSIS — A419 Sepsis, unspecified organism: Secondary | ICD-10-CM | POA: Diagnosis not present

## 2020-10-11 LAB — CBC WITH DIFFERENTIAL/PLATELET
Abs Immature Granulocytes: 0.17 10*3/uL — ABNORMAL HIGH (ref 0.00–0.07)
Basophils Absolute: 0 10*3/uL (ref 0.0–0.1)
Basophils Relative: 0 %
Eosinophils Absolute: 0.1 10*3/uL (ref 0.0–0.5)
Eosinophils Relative: 0 %
HCT: 43 % (ref 36.0–46.0)
Hemoglobin: 14.7 g/dL (ref 12.0–15.0)
Immature Granulocytes: 1 %
Lymphocytes Relative: 4 %
Lymphs Abs: 0.6 10*3/uL — ABNORMAL LOW (ref 0.7–4.0)
MCH: 31.4 pg (ref 26.0–34.0)
MCHC: 34.2 g/dL (ref 30.0–36.0)
MCV: 91.9 fL (ref 80.0–100.0)
Monocytes Absolute: 0.7 10*3/uL (ref 0.1–1.0)
Monocytes Relative: 5 %
Neutro Abs: 13.3 10*3/uL — ABNORMAL HIGH (ref 1.7–7.7)
Neutrophils Relative %: 90 %
Platelets: 267 10*3/uL (ref 150–400)
RBC: 4.68 MIL/uL (ref 3.87–5.11)
RDW: 14.2 % (ref 11.5–15.5)
WBC: 14.8 10*3/uL — ABNORMAL HIGH (ref 4.0–10.5)
nRBC: 0 % (ref 0.0–0.2)

## 2020-10-11 LAB — BASIC METABOLIC PANEL
Anion gap: 14 (ref 5–15)
BUN: 105 mg/dL — ABNORMAL HIGH (ref 8–23)
CO2: 32 mmol/L (ref 22–32)
Calcium: 8.2 mg/dL — ABNORMAL LOW (ref 8.9–10.3)
Chloride: 92 mmol/L — ABNORMAL LOW (ref 98–111)
Creatinine, Ser: 1.69 mg/dL — ABNORMAL HIGH (ref 0.44–1.00)
GFR, Estimated: 31 mL/min — ABNORMAL LOW (ref >60)
Glucose, Bld: 110 mg/dL — ABNORMAL HIGH (ref 70–99)
Potassium: 3.5 mmol/L (ref 3.5–5.1)
Sodium: 138 mmol/L (ref 135–145)

## 2020-10-11 LAB — GLUCOSE, CAPILLARY
Glucose-Capillary: 115 mg/dL — ABNORMAL HIGH (ref 70–99)
Glucose-Capillary: 125 mg/dL — ABNORMAL HIGH (ref 70–99)
Glucose-Capillary: 117 mg/dL — ABNORMAL HIGH (ref 70–99)
Glucose-Capillary: 118 mg/dL — ABNORMAL HIGH (ref 70–99)

## 2020-10-11 LAB — TROPONIN I (HIGH SENSITIVITY): Troponin I (High Sensitivity): 104 ng/L (ref <18)

## 2020-10-11 MED ORDER — LACTATED RINGERS IV BOLUS
250.0000 mL | Freq: Once | INTRAVENOUS | Status: AC
  Administered 2020-10-11: 250 mL via INTRAVENOUS

## 2020-10-11 MED ORDER — DIGOXIN 0.25 MG/ML IJ SOLN
0.1250 mg | Freq: Once | INTRAMUSCULAR | Status: AC
  Administered 2020-10-11: 0.125 mg via INTRAVENOUS
  Filled 2020-10-11: qty 2

## 2020-10-11 MED ORDER — BISACODYL 10 MG RE SUPP
10.0000 mg | Freq: Two times a day (BID) | RECTAL | Status: DC
  Administered 2020-10-11 – 2020-10-14 (×6): 10 mg via RECTAL
  Filled 2020-10-11 (×11): qty 1

## 2020-10-11 MED ORDER — LEVALBUTEROL HCL 1.25 MG/0.5ML IN NEBU
1.2500 mg | INHALATION_SOLUTION | Freq: Four times a day (QID) | RESPIRATORY_TRACT | Status: DC
  Administered 2020-10-11 – 2020-10-12 (×6): 1.25 mg via RESPIRATORY_TRACT
  Filled 2020-10-11 (×6): qty 0.5

## 2020-10-11 MED ORDER — DEXTROSE-NACL 5-0.45 % IV SOLN: INTRAVENOUS | Status: DC

## 2020-10-11 NOTE — Progress Notes (Signed)
  Subjective: Urine remains clear off CBI.  Objective: Vital signs in last 24 hours: Temp:  [97.7 F (36.5 C)-98.4 F (36.9 C)] 97.7 F (36.5 C) (09/10 0718) Pulse Rate:  [44-145] 128 (09/10 0800) Resp:  [17-44] 34 (09/10 0800) BP: (70-119)/(39-99) 91/68 (09/10 0800) SpO2:  [90 %-98 %] 93 % (09/10 0800) FiO2 (%):  [55 %-60 %] 55 % (09/10 0742)  Intake/Output from previous day: 09/09 0701 - 09/10 0700 In: 2357.6 [I.V.:2205.9; NG/GT:50; IV Piggyback:101.7] Out: 1350 [Urine:1100; Emesis/NG output:250] Intake/Output this shift: Total I/O In: 339.5 [I.V.:339.5] Out: -   Physical Exam:  General: Alert and oriented CV: RRR Lungs: Clear Abdomen: Soft, ND Incisions: Ext: NT, No erythema  Lab Results: Recent Labs    10/09/20 0251 10/10/20 0233 10/11/20 0742  HGB 17.9* 16.8* 14.7  HCT 52.0* 48.4* 43.0   BMET Recent Labs    10/10/20 0233 10/11/20 0742  NA 133* 138  K 4.5 3.5  CL 84* 92*  CO2 31 32  GLUCOSE 135* 110*  BUN 110* PENDING  CREATININE 2.47* 1.69*  CALCIUM 8.5* 8.2*     Studies/Results: DG Chest Port 1 View  Result Date: 10/10/2020 CLINICAL DATA:  Respiratory failure. EXAM: PORTABLE CHEST 1 VIEW COMPARISON:  Radiograph 10/04/2020, lung bases from abdominal CT 10/08/2020 FINDINGS: Enteric tube tip below the diaphragm not included in the field of view. Stable heart size and mediastinal contours. Aortic atherosclerosis. Patchy bibasilar opacities without significant interval change. There is mild background bronchial thickening. Opacity abutting the left costophrenic angle represents a prominent epicardial fat pad on CT. Mild peribronchial thickening. No pneumothorax. Stable osseous structures. IMPRESSION: Unchanged patchy bibasilar opacities, atelectasis versus pneumonia. Electronically Signed   By: Narda Rutherford M.D.   On: 10/10/2020 00:37   DG Abd Portable 1V  Result Date: 10/10/2020 CLINICAL DATA:  Constipation. EXAM: PORTABLE ABDOMEN - 1 VIEW  COMPARISON:  10/09/2020 FINDINGS: An enteric tube remains in place terminating over the stomach. A large amount of stool remains in the right colon. Small bowel dilatation is unchanged. Oral contrast material remains in dilated small bowel loops without passage to the colon. IMPRESSION: Unchanged small bowel dilatation without progression of oral contrast material. Electronically Signed   By: Sebastian Ache M.D.   On: 10/10/2020 08:05    Assessment/Plan: Gross hematuria, resolved now off CBI please call urology as needed recurrent hematuria    LOS: 19 days   Belva Agee 10/11/2020, 9:07 AM

## 2020-10-11 NOTE — Progress Notes (Addendum)
NAME:  Kimberly Ballard, MRN:  [id number], DOB:  April 01, 1940, LOS: 19 ADMISSION DATE:  09/22/2020, CONSULTATION DATE:  09/23/2020 REFERRING MD:  Adela Glimpse - [facility] CHIEF COMPLAINT:  Dyspnea, cough   BRIEF  80 year old woman presented to [facility] ED 8/22 with dyspnea, body aches, cough after a significant COVID exposure. PMHx significant for AF (on Eliquis), COPD and former tobacco abuse.  Per patient, her daughter was diagnosed with COVID and patient developed symptoms ~8/16.  She discussed concerns with her PCP and paxlovid was prescribed 8/18.  At [facility], she was tested for COVID twice (both negative PCRs) and IgG antibody was also negative. Per PCP notes, patient did receive the primary series of the COVID vaccine as well as 2 subsequent boosters.    Pertinent Medical History:  COPD Atrial fibrillation Former cigarette smoker  Significant Hospital Events: Including procedures, antibiotic start and stop dates in addition to other pertinent events   8/22 - Admit to SDU.  Cough weak.  Pulmonary asked to evaluate given concern for progressive respiratory failure.  COVID-19 and influenza negative. Respiratory viral panel negative.   IV ceftriaxone and azithromycin initiated.  Given IV Solu-Medrol followed by oral prednisone order. 8/23 - Pulmonary asked to evaluate given worsening respiratory failure.  CT chest showing lower lobe consolidation.  There was concern about possible aspiration.  Echo obtained showed good left ventricular function with mild pulmonary artery hypertension. RUQ ultrasound showed hepatic steatosis, possibly cirrhosis based on nodular liver contour, no gallbladder 8/24 - Cardiology consulted for elevated troponins.  This was felt secondary to worsening hypoxia and underlying pulmonary artery hypertension which was felt previously undiagnosed issue.  New onset atrial fibrillation with RVR.  CT angiogram ordered.  Increased oxygen requirement overnight, requiring BiPAP, later placed on 15 L/min  via high flow.  Lower extremity ultrasound negative for DVT 8/25 - Urine strep neg.  Still high FIO2. F/u COVID neg. Transitioned to oral CCB and DOAC. Getting IV lasix. Transitioned to headed High flow [place] during day and BIPAP at night.  8/26 - Still w/ overall net positive fluid balance over hospital stay. CXR w/ worse edema. Lasix increased to 80 mg q 12. Azithromycin stopped (day 5) Steroids reduced.  Cardiology signed off. (See note from cards w/ plan to follow as out-pt)  8/27 - Diuresed, CXR better, eating, got up to chair 8/28 -9/1 Remains on FIO2 80% despite aggressive diuresis. Net neg 11L. 8/31 - NO BUBBLE SHUNT on echo 9/02 - Ongoing High FiO2 requirements. Net -13.7L/admission (-1.6L/24H). Tolerating HFNC/BiPAP and nebulization treatments. New hematuria for which Urology consulted, started CBI. 9/3 - Slight improvement in FiO2 9/4 - PCCM signed off 9/8 - Remains on HHFNC 25L/45% FiO2, pursed-lip breathing. NGT in place for ileus.Reports that her breathing feels more difficult since earlier today. Pursed-lip breathing on exam. Remains on HHFNC 25LPM/45% FiO2. Sats 87-94% today. Belly feels bloated, passing some gas but minimal Bms. NGT remains in place - CCS managing. Mildly TTP over RLQ 9/9 -  35%  L HHFNC, 60% fio2 and worse. Ileus ongoing. On Neo since last night.  Last lopressor and cardizem last night. This morning a Fib RVR and amio gtt started. Covid IgG - still negative Palliative care consult.  Interim History / Subjective:   10/11/2020 -not on the ventilator.  Is on FiO2 55% with 25 L flow rate on heated high flow nasal cannula.  Is on Cardizem drip, amiodarone drip and also Neo-Synephrine.  Heart rate of 111.- 125. AKi getting better. On  Nacl at 37ml/h.  She says she used BiPAP last night.  She had mentioned abdominal pain.  She is on NG tube with suction.  Repeat COVID IgG negative  Objective   Blood pressure (!) 89/63, pulse (!) 104, temperature 98.5 F (36.9 C), temperature  source Axillary, resp. rate 15, height 5\' 3"  (1.6 m), weight 89.9 kg, SpO2 91 %.    FiO2 (%):  [55 %-60 %] 55 %   Intake/Output Summary (Last 24 hours) at 10/11/2020 1303 Last data filed at 10/11/2020 0805 Gross per 24 hour  Intake 2388.25 ml  Output 1200 ml  Net 1188.25 ml   Filed Weights   10/09/20 0320 10/10/20 0500 10/11/20 1000  Weight: 84.8 kg 84.8 kg 89.9 kg   Physical Examination:  Obese lady in the bed.  Looks deconditioned.  She reports she still is motivated to get better.  Abdomen is distended nonspecifically mildly tender.  Clear to auscultation.  High flow nasal cannula on.  NG tube is draining.  She is in A. fib.    Resolved Hospital Problem List:    Assessment & Plan:  Acute hypoxemic/hypercarbic respiratory failure Suspect more chronic in nature given well-compensated respiratory acidosis as well as O2 sat 88% (2019).  In addition, she has polycythemia (likely in the setting of chronic hypoxemia).  She has never been on HOT. Now worsened with bilateral nodular infiltrates as well as atelectasis.  Suspect shunt physiology, not well-compensated. She has been treated with antibiotics as well as steroid taper without much improvement, though this may be gradual over time.  Some concern for COVID pneumonitis, although she has been COVID tested and negative x 2 despite close contacts with positive COVID tests.  She has equivocal platypnea on exam. Orthodeoxia not well-established, but does seem to have higher sats at times when more supine.  Some concern for cirrhosis, which would put her at risk for hepatopulmonary syndrome. Also suspect component of abdominal bloating (2/2 ileus/constipation) is contributing to her subjective SOB.   10/11/2020: Continued acute on chronic hypoxic respiratory failure with significant oxygen requirement  Plan - Goal O2 sat 88-92% - Ongoing diuresis as tolerated (per Cardiology) - Increase mobility as able (OOB, PT/OT) - Bronchodilators as  ordered - Pulmonary hygiene with IS, increased mobility - Eventual rehab when clinically able - BiPAP QHS -DC Brovana as nebulizer changed to Xopenex given A. fib   COPD Based on PFTs 2019.  Physiologically quite severe with what appears to be chronic hypoxemic respiratory failure, although not on chronic HOT. S/p steroid taper.  Plan - Continue bronchodilators  Severe constipation, ileus vs. SBO   Plan - Surgery following - NGT remains in place, clamping with sips/chips per CCS - Optimize electrolytes for bowel function (K > 4, Mg  > 2) - Bowel regimen - Mobilization as above    A Fib RVR  and ciculatory shock  10/11/2020 - amio gt and neo.  Also ? back on Cardizem infusion..  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 No biopsy. No established diagnosis of portal hypertension. Would put her at risk of potential hepatopulmonary syndrome, although this is not well-established in this patient.  Plan - Outpatient evaluation/follow up as appropriate  Acute kidney injury  10/11/2020: Improving  Plan - According to the hospitalist  Best Practice: (right click and "Reselect all SmartList Selections" daily)   Per Primary Team - overall declining. No improvement. Goals of care appopriate    ATTESTATION & SIGNATURE   The patient Kimberly Ballard is critically ill with multiple organ systems failure and requires high complexity decision making for assessment and support, frequent evaluation and titration of therapies, application of advanced monitoring technologies and extensive interpretation of multiple databases.   Critical Care Time devoted to patient care services described in this note is  35  Minutes. This time reflects time of care of this signee Dr Kalman Shan. This critical care time does not reflect  procedure time, or teaching time or supervisory time of PA/NP/Med student/Med Resident etc but could involve care discussion time     Dr. Kalman Shan, M.D., [facility].C.P Pulmonary and Critical Care Medicine Staff Physician [facility] [place] Pager: [phone], If no answer or between  15:00h - 7:00h: call [phone]  10/11/2020 1:22 PM    LABS    PULMONARY No results for input(s): PHART, PCO2ART, PO2ART, HCO3, TCO2, O2SAT in the last 168 hours.  Invalid input(s): PCO2, PO2  CBC Recent Labs  Lab 10/09/20 0251 10/10/20 0233 10/11/20 0742  HGB 17.9* 16.8* 14.7  HCT 52.0* 48.4* 43.0  WBC 31.6* 21.7* 14.8*  PLT 329 329 267    COAGULATION No results for input(s): INR in the last 168 hours.  CARDIAC  No results for input(s): TROPONINI in the last 168 hours. No results for input(s): PROBNP in the last 168 hours.   CHEMISTRY Recent Labs  Lab 10/05/20 0304 10/06/20 0251 10/07/20 0253 10/08/20 0302 10/09/20 0251 10/10/20 0233 10/11/20 0742  NA 132*   < > 128* 131* 133* 133* 138  K 3.5   < > 3.2* 4.9 4.8 4.5 3.5  CL 80*   < > 80* 83* 85* 84* 92*  CO2 43*   < > 33* 33* 32 31 32  GLUCOSE 117*   < > 149* 130* 125* 135* 110*  BUN 40*   < > 44* 61* 82* 110* 105*  CREATININE 0.60   < > 0.78 1.14* 1.49* 2.47* 1.69*  CALCIUM 8.4*   < > 8.6* 8.7* 9.1 8.5* 8.2*  MG 2.2  --   --   --   --  3.3*  --   PHOS 3.5  --   --   --   --   --   --    < > = values in this interval not displayed.   Estimated Creatinine Clearance: 28.7 mL/min (A) (by C-G formula based on SCr of 1.69 mg/dL (H)).   LIVER Recent Labs  Lab 10/05/20 0304 10/08/20 0302 10/09/20 0251  AST 39 70* 47*  ALT 83* 124* 95*  ALKPHOS 59 69 78  BILITOT 2.0* 1.6* 1.8*  PROT 5.7* 6.4* 6.5  ALBUMIN 2.7* 3.0* 2.9*     INFECTIOUS Recent Labs  Lab 10/10/20 1225  LATICACIDVEN 1.5  PROCALCITON 2.60     ENDOCRINE CBG (last 3)  Recent Labs    10/10/20 2154  10/11/20 0733 10/11/20 1200  GLUCAP 115* 115* 125*         IMAGING x48h  - image(s) personally visualized  -   highlighted in bold DG Chest Port 1 View  Result Date: 10/10/2020 CLINICAL DATA:  Respiratory failure. EXAM: PORTABLE CHEST 1 VIEW COMPARISON:  Radiograph 10/04/2020, lung bases from abdominal CT 10/08/2020 FINDINGS: Enteric tube  tip below the diaphragm not included in the field of view. Stable heart size and mediastinal contours. Aortic atherosclerosis. Patchy bibasilar opacities without significant interval change. There is mild background bronchial thickening. Opacity abutting the left costophrenic angle represents a prominent epicardial fat pad on CT. Mild peribronchial thickening. No pneumothorax. Stable osseous structures. IMPRESSION: Unchanged patchy bibasilar opacities, atelectasis versus pneumonia. Electronically Signed   By: Narda RutherfordMelanie  Sanford M.D.   On: 10/10/2020 00:37   DG Abd Portable 1V  Result Date: 10/10/2020 CLINICAL DATA:  Constipation. EXAM: PORTABLE ABDOMEN - 1 VIEW COMPARISON:  10/09/2020 FINDINGS: An enteric tube remains in place terminating over the stomach. A large amount of stool remains in the right colon. Small bowel dilatation is unchanged. Oral contrast material remains in dilated small bowel loops without passage to the colon. IMPRESSION: Unchanged small bowel dilatation without progression of oral contrast material. Electronically Signed   By: Sebastian AcheAllen  Grady M.D.   On: 10/10/2020 08:05

## 2020-10-11 NOTE — Progress Notes (Signed)
Subjective: Patient denies bowel movements, however as per her nurse, she had small bowel movements yesterday and a small smear of bowel movement today morning at 7:15 AM. 1250 cc of bilious fluid removed in 24 hours between September 8 to 9. 250 cc of bilious fluid removed in the last 24 hours by NG tube.  Objective: Vital signs in last 24 hours: Temp:  [97.7 F (36.5 C)-98.4 F (36.9 C)] 97.7 F (36.5 C) (09/10 0718) Pulse Rate:  [27-145] 128 (09/10 0800) Resp:  [17-44] 34 (09/10 0800) BP: (70-119)/(39-99) 91/68 (09/10 0800) SpO2:  [90 %-98 %] 93 % (09/10 0800) FiO2 (%):  [55 %-60 %] 55 % (09/10 0742) Weight change:  Last BM Date: 10/11/20  PE: Ill-appearing, NG tube draining bilious fluid GENERAL: On oxygen via nasal cannula  ABDOMEN: Distended mild tenderness appreciated in upper abdomen, sluggish to nonexistent bowel sounds EXTREMITIES: No edema  Lab Results: Results for orders placed or performed during the hospital encounter of 09/22/20 (from the past 48 hour(s))  Glucose, capillary     Status: Abnormal   Collection Time: 10/09/20 11:12 AM  Result Value Ref Range   Glucose-Capillary 125 (H) 70 - 99 mg/dL    Comment: Glucose reference range applies only to samples taken after fasting for at least 8 hours.   Comment 1 Notify RN    Comment 2 Document in Chart   Glucose, capillary     Status: Abnormal   Collection Time: 10/09/20  3:58 PM  Result Value Ref Range   Glucose-Capillary 195 (H) 70 - 99 mg/dL    Comment: Glucose reference range applies only to samples taken after fasting for at least 8 hours.   Comment 1 Notify RN    Comment 2 Document in Chart   Glucose, capillary     Status: Abnormal   Collection Time: 10/09/20 10:08 PM  Result Value Ref Range   Glucose-Capillary 167 (H) 70 - 99 mg/dL    Comment: Glucose reference range applies only to samples taken after fasting for at least 8 hours.   Comment 1 Notify RN    Comment 2 Document in Chart   CBC     Status:  Abnormal   Collection Time: 10/10/20  2:33 AM  Result Value Ref Range   WBC 21.7 (H) 4.0 - 10.5 K/uL   RBC 5.25 (H) 3.87 - 5.11 MIL/uL   Hemoglobin 16.8 (H) 12.0 - 15.0 g/dL   HCT 91.4 (H) 78.2 - 95.6 %   MCV 92.2 80.0 - 100.0 fL   MCH 32.0 26.0 - 34.0 pg   MCHC 34.7 30.0 - 36.0 g/dL   RDW 21.3 08.6 - 57.8 %   Platelets 329 150 - 400 K/uL   nRBC 0.0 0.0 - 0.2 %    Comment: Performed at [facility], [address] [place], [place] [address]  Basic metabolic panel     Status: Abnormal   Collection Time: 10/10/20  2:33 AM  Result Value Ref Range   Sodium 133 (L) 135 - 145 mmol/L   Potassium 4.5 3.5 - 5.1 mmol/L   Chloride 84 (L) 98 - 111 mmol/L   CO2 31 22 - 32 mmol/L   Glucose, Bld 135 (H) 70 - 99 mg/dL    Comment: Glucose reference range applies only to samples taken after fasting for at least 8 hours.   BUN 110 (H) 8 - 23 mg/dL    Comment: RESULTS CONFIRMED BY MANUAL DILUTION   Creatinine, Ser 2.47 (H)  0.44 - 1.00 mg/dL   Calcium 8.5 (L) 8.9 - 10.3 mg/dL   GFR, Estimated 19 (L) >60 mL/min    Comment: (NOTE) Calculated using the CKD-EPI Creatinine Equation (2021)    Anion gap 18 (H) 5 - 15    Comment: Performed at [facility], [address] [place], [place] [address]  Magnesium     Status: Abnormal   Collection Time: 10/10/20  2:33 AM  Result Value Ref Range   Magnesium 3.3 (H) 1.7 - 2.4 mg/dL    Comment: Performed at [facility], [address] [place], [place] [address]  Glucose, capillary     Status: Abnormal   Collection Time: 10/10/20  8:16 AM  Result Value Ref Range   Glucose-Capillary 128 (H) 70 - 99 mg/dL    Comment: Glucose reference range applies only to samples taken after fasting for at least 8 hours.  Lactic acid, plasma     Status: None   Collection Time: 10/10/20 12:25 PM  Result Value Ref Range   Lactic Acid, Venous 1.5 0.5 - 1.9 mmol/L    Comment: Performed at [facility],  [address] [place], [place] [address]  Procalcitonin - Baseline     Status: None   Collection Time: 10/10/20 12:25 PM  Result Value Ref Range   Procalcitonin 2.60 ng/mL    Comment:        Interpretation: PCT > 2 ng/mL: Systemic infection (sepsis) is likely, unless other causes are known. (NOTE)       Sepsis PCT Algorithm           Lower Respiratory Tract                                      Infection PCT Algorithm    ----------------------------     ----------------------------         PCT < 0.25 ng/mL                PCT < 0.10 ng/mL          Strongly encourage             Strongly discourage   discontinuation of antibiotics    initiation of antibiotics    ----------------------------     -----------------------------       PCT 0.25 - 0.50 ng/mL            PCT 0.10 - 0.25 ng/mL               OR       >80% decrease in PCT            Discourage initiation of                                            antibiotics      Encourage discontinuation           of antibiotics    ----------------------------     -----------------------------         PCT >= 0.50 ng/mL              PCT 0.26 - 0.50 ng/mL               AND       <80% decrease in  PCT              Encourage initiation of                                             antibiotics       Encourage continuation           of antibiotics    ----------------------------     -----------------------------        PCT >= 0.50 ng/mL                  PCT > 0.50 ng/mL               AND         increase in PCT                  Strongly encourage                                      initiation of antibiotics    Strongly encourage escalation           of antibiotics                                     -----------------------------                                           PCT <= 0.25 ng/mL                                                 OR                                        > 80% decrease in PCT                                       Discontinue / Do not initiate                                             antibiotics  Performed at [facility], [address] [place], [place] [address]   Troponin I (High Sensitivity)     Status: Abnormal   Collection Time: 10/10/20 12:25 PM  Result Value Ref Range   Troponin I (High Sensitivity) 145 (HH) <18 ng/L    Comment: CRITICAL RESULT CALLED TO, READ BACK BY AND VERIFIED WITH: VARNER,C. RN @1337  ON 09.09.2022 BY COHEN,K (NOTE) Elevated high sensitivity troponin I (hsTnI) values and significant  changes across serial measurements may suggest ACS but many other  chronic and acute conditions are known to elevate hsTnI results.  Refer to the Links section for chest pain  algorithms and additional  guidance. Performed at [facility], [address] [place], [place] [address]   Glucose, capillary     Status: Abnormal   Collection Time: 10/10/20 12:27 PM  Result Value Ref Range   Glucose-Capillary 128 (H) 70 - 99 mg/dL    Comment: Glucose reference range applies only to samples taken after fasting for at least 8 hours.  SAR CoV2 Serology (COVID 19)AB(IGG)IA     Status: None   Collection Time: 10/10/20 12:40 PM  Result Value Ref Range   SARS-CoV-2 Ab, IgG NON REACTIVE NON REACTIVE    Comment: (NOTE) Non-Reactive for SARS-CoV-2 IgG Antibodies.   SARS-CoV-2 IgG antibodies not detected.  Negative results do not preclude acute SARS-CoV-2 infection.  Negative results may occur in samples collected too soon following infection or in immunosuppressed patients.  Serologic results should not be used to diagnose or exclude active/recent SARS-CoV-2 infection.  If acute infection is suspected, direct testing for SARS-CoV-2 is necessary.     The expected result is Non-Reactive.   Fact Sheet for Recipients:  MarketingSheets.sihttps://www.fda.gov/media/139626/download   Fact Sheet for Healthcare Providers:  http://knight-sullivan.biz/https://www.fda.gov/media/139625/download   Testing  was performed using the Beck[employer] Coulter SARS-CoV-2 IgG assay.  This test is not yet approved or cleared by the [place] FDA and has been authorized by FDA under an Emergency Use Authorization (EUA).  This EUA will remain in effect (meaning this test can be used) for the duration of the COVID-19 declaration under Section 564( b)(1) of the Act, 21 U.S.C. section 360bbb-3(b)(1), unless the authorization is terminated or revoked sooner.  Performed at [facility], [address] [place], [place] [address]   Glucose, capillary     Status: Abnormal   Collection Time: 10/10/20  4:17 PM  Result Value Ref Range   Glucose-Capillary 139 (H) 70 - 99 mg/dL    Comment: Glucose reference range applies only to samples taken after fasting for at least 8 hours.   Comment 1 Notify RN    Comment 2 Document in Chart   Glucose, capillary     Status: Abnormal   Collection Time: 10/10/20  9:54 PM  Result Value Ref Range   Glucose-Capillary 115 (H) 70 - 99 mg/dL    Comment: Glucose reference range applies only to samples taken after fasting for at least 8 hours.  Glucose, capillary     Status: Abnormal   Collection Time: 10/11/20  7:33 AM  Result Value Ref Range   Glucose-Capillary 115 (H) 70 - 99 mg/dL    Comment: Glucose reference range applies only to samples taken after fasting for at least 8 hours.  CBC with Differential/Platelet     Status: Abnormal   Collection Time: 10/11/20  7:42 AM  Result Value Ref Range   WBC 14.8 (H) 4.0 - 10.5 K/uL   RBC 4.68 3.87 - 5.11 MIL/uL   Hemoglobin 14.7 12.0 - 15.0 g/dL   HCT 09.843.0 11.936.0 - 14.746.0 %   MCV 91.9 80.0 - 100.0 fL   MCH 31.4 26.0 - 34.0 pg   MCHC 34.2 30.0 - 36.0 g/dL   RDW 82.914.2 56.211.5 - 13.015.5 %   Platelets 267 150 - 400 K/uL   nRBC 0.0 0.0 - 0.2 %   Neutrophils Relative % 90 %   Neutro Abs 13.3 (H) 1.7 - 7.7 K/uL   Lymphocytes Relative 4 %   Lymphs Abs 0.6 (L) 0.7 - 4.0 K/uL   Monocytes Relative 5 %   Monocytes Absolute  0.7 0.1 - 1.0 K/uL    Eosinophils Relative 0 %   Eosinophils Absolute 0.1 0.0 - 0.5 K/uL   Basophils Relative 0 %   Basophils Absolute 0.0 0.0 - 0.1 K/uL   Immature Granulocytes 1 %   Abs Immature Granulocytes 0.17 (H) 0.00 - 0.07 K/uL    Comment: Performed at [facility], [address], [place], [place] [address]    Studies/Results: DG Chest Port 1 View  Result Date: 10/10/2020 CLINICAL DATA:  Respiratory failure. EXAM: PORTABLE CHEST 1 VIEW COMPARISON:  Radiograph 10/04/2020, lung bases from abdominal CT 10/08/2020 FINDINGS: Enteric tube tip below the diaphragm not included in the field of view. Stable heart size and mediastinal contours. Aortic atherosclerosis. Patchy bibasilar opacities without significant interval change. There is mild background bronchial thickening. Opacity abutting the left costophrenic angle represents a prominent epicardial fat pad on CT. Mild peribronchial thickening. No pneumothorax. Stable osseous structures. IMPRESSION: Unchanged patchy bibasilar opacities, atelectasis versus pneumonia. Electronically Signed   By: Narda Rutherford M.D.   On: 10/10/2020 00:37   DG Abd Portable 1V  Result Date: 10/10/2020 CLINICAL DATA:  Constipation. EXAM: PORTABLE ABDOMEN - 1 VIEW COMPARISON:  10/09/2020 FINDINGS: An enteric tube remains in place terminating over the stomach. A large amount of stool remains in the right colon. Small bowel dilatation is unchanged. Oral contrast material remains in dilated small bowel loops without passage to the colon. IMPRESSION: Unchanged small bowel dilatation without progression of oral contrast material. Electronically Signed   By: Sebastian Ache M.D.   On: 10/10/2020 08:05    Medications: I have reviewed the patient's current medications.  Assessment: Generalized ileus CT showed large stool burden in the right and transverse colon with proximal small bowel dilation up to 3.1 cm Abdominal x-ray yesterday showed unchanged small bowel dilation  without progression of oral contrast material  Multiple comorbidities-acute hypoxic/hypercarbic respiratory failure requiring BiPAP, diastolic CHF, multifocal pneumonia, hypotensive on IV pressors, atrial fibrillation with RVR on amiodarone and diltiazem  Worsening renal function BUN 110, creatinine 2.47 Elevated troponin I  Plan: Continue MiraLAX 17 g 3 times a day, as per patient's nurse NG tube is clamped for an hour after receiving MiraLAX. Keep potassium above 4 and magnesium above 2. Will obtain abdominal x-ray today. Will add Dulcolax suppositories twice a day to be given on a regular basis.  Unfortunately, given her clinical situation it will be hard to ambulate her. Continue supportive management.  Kerin Salen, MD 10/11/2020, 8:11 AM

## 2020-10-11 NOTE — Progress Notes (Signed)
Case discussed with critical care and palliative care through secure chat, Patient is on pressors, critical care managing, will check back tomorrow.

## 2020-10-11 NOTE — Progress Notes (Signed)
Progress Note  Patient Name: Kimberly Ballard Date of Encounter: 10/11/2020  [facility] Cardiologist: Armanda Magic, MD   Subjective   Reports feeling the same.  Still short of breath and abdominal discomfort.  Denies any more bowel movements.  Inpatient Medications    Scheduled Meds:  arformoterol  15 mcg Nebulization BID   budesonide (PULMICORT) nebulizer solution  0.5 mg Nebulization BID   chlorhexidine  15 mL Mouth Rinse BID   Chlorhexidine Gluconate Cloth  6 each Topical Daily   Gerhardt's butt cream   Topical TID   guaiFENesin  15 mL Per Tube QID   heparin injection (subcutaneous)  5,000 Units Subcutaneous Q8H   insulin aspart  0-9 Units Subcutaneous TID WC   mouth rinse  15 mL Mouth Rinse q12n4p   polyethylene glycol  17 g Per Tube TID   revefenacin  175 mcg Nebulization Daily   vancomycin variable dose per unstable renal function (pharmacist dosing)   Does not apply See admin instructions   Continuous Infusions:  sodium chloride Stopped (09/29/20 1824)   sodium chloride 75 mL/hr at 10/11/20 0805   sodium chloride Stopped (10/10/20 0309)   amiodarone 30 mg/hr (10/11/20 0805)   ceFEPime (MAXIPIME) IV Stopped (10/10/20 2144)   phenylephrine (NEO-SYNEPHRINE) Adult infusion 25 mcg/min (10/11/20 0805)   PRN Meds: sodium chloride, levalbuterol, oxybutynin, simethicone, sodium chloride   Vital Signs    Vitals:   10/11/20 0700 10/11/20 0718 10/11/20 0742 10/11/20 0800  BP: 92/68   91/68  Pulse: (!) 110  (!) 121 (!) 128  Resp: 19  (!) 27 (!) 34  Temp:  97.7 F (36.5 C)    TempSrc:  Oral    SpO2: 94%  92% 93%  Weight:      Height:        Intake/Output Summary (Last 24 hours) at 10/11/2020 0815 Last data filed at 10/11/2020 0805 Gross per 24 hour  Intake 2697.06 ml  Output 1200 ml  Net 1497.06 ml   Last 3 Weights 10/10/2020 10/09/2020 10/08/2020  Weight (lbs) 186 lb 15.2 oz 186 lb 15.2 oz 186 lb 4.6 oz  Weight (kg) 84.8 kg 84.8 kg 84.5 kg      Telemetry     Atrial fibrillation.  Rate 110s to 130s.  PVCs.- Personally Reviewed  ECG    N/AA- Personally Reviewed  Physical Exam   VS:  BP 108/74   Pulse (!) 125   Temp 97.7 F (36.5 C) (Oral)   Resp 17   Ht 5\' 3"  (1.6 m)   Wt 84.8 kg   SpO2 95%   BMI 33.12 kg/m  , BMI Body mass index is 33.12 kg/m. GENERAL: Critically ill-appearing. HEENT: Pupils equal round and reactive, fundi not visualized, oral mucosa unremarkable NECK:  + jugular venous distention to mid neck at 45 degrees.  Waveform within normal limits, carotid upstroke brisk and symmetric, no bruits LUNGS:  Clear to auscultation bilaterally HEART: Tachycardic.  Irregularly irregular.  PMI not displaced or sustained,S1 and S2 within normal limits, no S3, no S4, no clicks, no rubs, no murmurs ABD:  distended.  Hypoactive bowel sounds  EXT:  2 plus pulses throughout, no edema, no cyanosis no clubbing SKIN:  No rashes no nodules NEURO:  Cranial nerves II through XII grossly intact, motor grossly intact throughout PSYCH:  Cognitively intact, oriented to person place and time   Labs    High Sensitivity Troponin:   Recent Labs  Lab 09/23/20 0207 09/24/20 0239 10/04/20 1721  10/04/20 1923 10/10/20 1225  TROPONINIHS 100* 52* 59* 67* 145*      Chemistry Recent Labs  Lab 10/05/20 0304 10/06/20 0251 10/08/20 0302 10/09/20 0251 10/10/20 0233  NA 132*   < > 131* 133* 133*  K 3.5   < > 4.9 4.8 4.5  CL 80*   < > 83* 85* 84*  CO2 43*   < > 33* 32 31  GLUCOSE 117*   < > 130* 125* 135*  BUN 40*   < > 61* 82* 110*  CREATININE 0.60   < > 1.14* 1.49* 2.47*  CALCIUM 8.4*   < > 8.7* 9.1 8.5*  PROT 5.7*  --  6.4* 6.5  --   ALBUMIN 2.7*  --  3.0* 2.9*  --   AST 39  --  70* 47*  --   ALT 83*  --  124* 95*  --   ALKPHOS 59  --  69 78  --   BILITOT 2.0*  --  1.6* 1.8*  --   GFRNONAA >60   < > 49* 36* 19*  ANIONGAP 9   < > 15 16* 18*   < > = values in this interval not displayed.     Hematology Recent Labs  Lab  10/09/20 0251 10/10/20 0233 10/11/20 0742  WBC 31.6* 21.7* 14.8*  RBC 5.60* 5.25* 4.68  HGB 17.9* 16.8* 14.7  HCT 52.0* 48.4* 43.0  MCV 92.9 92.2 91.9  MCH 32.0 32.0 31.4  MCHC 34.4 34.7 34.2  RDW 13.8 13.9 14.2  PLT 329 329 267    BNPNo results for input(s): BNP, PROBNP in the last 168 hours.   DDimer No results for input(s): DDIMER in the last 168 hours.   Radiology    DG Chest Port 1 View  Result Date: 10/10/2020 CLINICAL DATA:  Respiratory failure. EXAM: PORTABLE CHEST 1 VIEW COMPARISON:  Radiograph 10/04/2020, lung bases from abdominal CT 10/08/2020 FINDINGS: Enteric tube tip below the diaphragm not included in the field of view. Stable heart size and mediastinal contours. Aortic atherosclerosis. Patchy bibasilar opacities without significant interval change. There is mild background bronchial thickening. Opacity abutting the left costophrenic angle represents a prominent epicardial fat pad on CT. Mild peribronchial thickening. No pneumothorax. Stable osseous structures. IMPRESSION: Unchanged patchy bibasilar opacities, atelectasis versus pneumonia. Electronically Signed   By: Narda Rutherford M.D.   On: 10/10/2020 00:37   DG Abd Portable 1V  Result Date: 10/10/2020 CLINICAL DATA:  Constipation. EXAM: PORTABLE ABDOMEN - 1 VIEW COMPARISON:  10/09/2020 FINDINGS: An enteric tube remains in place terminating over the stomach. A large amount of stool remains in the right colon. Small bowel dilatation is unchanged. Oral contrast material remains in dilated small bowel loops without passage to the colon. IMPRESSION: Unchanged small bowel dilatation without progression of oral contrast material. Electronically Signed   By: Sebastian Ache M.D.   On: 10/10/2020 08:05    Cardiac Studies   Complete Echo 09/23/2020: Impressions:  1. Left ventricular ejection fraction, by estimation, is 55 to 60%. Left  ventricular ejection fraction by 3D volume is 59 %. The left ventricle has  normal function.  The left ventricle has no regional wall motion  abnormalities. There is moderate left  ventricular hypertrophy. Left ventricular diastolic parameters are  consistent with Grade I diastolic dysfunction (impaired relaxation).   2. Right ventricular systolic function mildly reduced at base and mid,  with relative preserved apical systolic function. The right ventricular  size is moderately enlarged. There  is moderately elevated pulmonary artery  systolic pressure. The estimated  right ventricular systolic pressure is 48.4 mmHg.   3. Left atrial size was mildly dilated.   4. Right atrial size was moderately dilated.   5. The mitral valve is normal in structure. Trivial mitral valve  regurgitation. No evidence of mitral stenosis.   6. Tricuspid valve regurgitation is mild to moderate.   7. The aortic valve is normal in structure. Aortic valve regurgitation is  not visualized. No aortic stenosis is present.   8. The inferior vena cava is dilated in size with <50% respiratory  variability, suggesting right atrial pressure of 15 mmHg.  _______________   Limited Echo with Bubble Study 10/01/2020: Impressions: 1. Left ventricular ejection fraction, by estimation, is 60 to 65%. The  left ventricle has normal function. There is moderate concentric left  ventricular hypertrophy. Left ventricular diastolic function could not be  evaluated.   2. The mitral valve is grossly normal. Trivial mitral valve  regurgitation. No evidence of mitral stenosis.   3. The aortic valve is tricuspid. There is mild calcification of the  aortic valve. Aortic valve regurgitation is trivial. No aortic stenosis is  present.   4. Agitated saline contrast bubble study was negative, with no evidence  of any interatrial shunt.   Conclusion(s)/Recommendation(s): Limited study, no major change from  recent study 09/23/20. Bubble study negative for intraatrial shunt.   Patient Profile     80 y.o. female with COPD and prior  tobacco abuse admitted with multifocal pneumonia and COPD exacerbation.  Cardiology consulted for elevated troponin and atrial fibrillation with RVR.  Assessment & Plan    #New onset atrial fibrillation with RVR: Patient admitted with hypoxic respiratory failure and COPD exacerbation/pneumonia.  She has been hypotensive and not able to tolerate nodal agents.  She was started on amiodarone despite her transaminitis and underlying pulmonary disease given lack of other good options.  Her tachycardia and hypotension persist.  Plan for outpatient cardioversion after 3 to 4 weeks of anticoagulation.  She she cannot currently start anticoagulation given hematuria.  She has worsening renal failure.  We will try to give a single dose of IV digoxin.  #Acute diastolic heart failure: #RV failure: #Pulmonary hypertension: #Hypoxic respiratory failure: Per her family tested positive for COVID-19 09/2020.  Received treatment for with PACs fluid.  In the hospital she has been negative for COVID-19 and influenza.  She is requiring high flow nasal cannula.  RV failure noted on echo with reduced systolic function and moderately enlarged.  PASP 48 mmHg.  Systolic function was normal.  Right atrial pressure was 15 mmHg on 09/23/2020.  Palliative care is being pursued.  Currently she is receiving phenylephrine via peripheral IV.  Would clarify goals of care.  If aggressive treatment is going to persist, she would benefit from central access as we would be able to also check CVP's and co-ox.  Would not continue peripheral phenylephrine long-term.  #Elevated troponin: High-sensitivity troponin peaked at 128 previously and is up to 145 yesterday.  We will repeat a high-sensitivity troponin today to make sure it is not elevating significantly.  Overall, the pattern most consistent with demand ischemia.  She was noted to have some coronary calcification on a chest CT.  Would consider outpatient stress testing versus coronary CTA.   Likely not contributory to her current hospitalization.  # AKI: Renal function is worsening in the setting of likely mixed cardiogenic and septic shock.  # Ileus: # Severe constipation:  Patient had a small bowel movement yesterday.  Severe constipation noted on CT.  She is getting MiraLAX via her NG tube.  NG tube draining bilious fluid.  GI is following.      For questions or updates, please contact [facility] Please consult [email or web address] for contact info under   Total critical care time: 60 minutes. Critical care time was exclusive of separately billable procedures and treating other patients. Critical care was necessary to treat or prevent imminent or life-threatening deterioration. Critical care was time spent personally by me on the following activities: development of treatment plan with patient and/or surrogate as well as nursing, discussions with consultants, evaluation of patient's response to treatment, examination of patient, obtaining history from patient or surrogate, ordering and performing treatments and interventions, ordering and review of laboratory studies, ordering and review of radiographic studies, pulse oximetry and re-evaluation of patient's condition.      Signed, Chilton Siiffany [place], MD  10/11/2020, 8:15 AM

## 2020-10-11 NOTE — Progress Notes (Signed)
Subjective/Chief Complaint: She denies abdominal pain this morning Reports no flatus or bm over night   Objective: Vital signs in last 24 hours: Temp:  [97.1 F (36.2 C)-98.4 F (36.9 C)] 97.7 F (36.5 C) (09/10 0718) Pulse Rate:  [27-145] 121 (09/10 0742) Resp:  [17-44] 27 (09/10 0742) BP: (70-119)/(39-99) 92/68 (09/10 0700) SpO2:  [90 %-98 %] 92 % (09/10 0742) FiO2 (%):  [55 %-60 %] 55 % (09/10 0742) Last BM Date: 10/10/20  Intake/Output from previous day: 09/09 0701 - 09/10 0700 In: 2357.6 [I.V.:2205.9; NG/GT:50; IV Piggyback:101.7] Out: 1350 [Urine:1100; Emesis/NG output:250] Intake/Output this shift: No intake/output data recorded.  Exam: Awake Following commands Abdomen obese, distended, mild tenderness across upper abdomen  Lab Results:  Recent Labs    10/09/20 0251 10/10/20 0233  WBC 31.6* 21.7*  HGB 17.9* 16.8*  HCT 52.0* 48.4*  PLT 329 329   BMET Recent Labs    10/09/20 0251 10/10/20 0233  NA 133* 133*  K 4.8 4.5  CL 85* 84*  CO2 32 31  GLUCOSE 125* 135*  BUN 82* 110*  CREATININE 1.49* 2.47*  CALCIUM 9.1 8.5*   PT/INR No results for input(s): LABPROT, INR in the last 72 hours. ABG No results for input(s): PHART, HCO3 in the last 72 hours.  Invalid input(s): PCO2, PO2  Studies/Results: DG Chest Port 1 View  Result Date: 10/10/2020 CLINICAL DATA:  Respiratory failure. EXAM: PORTABLE CHEST 1 VIEW COMPARISON:  Radiograph 10/04/2020, lung bases from abdominal CT 10/08/2020 FINDINGS: Enteric tube tip below the diaphragm not included in the field of view. Stable heart size and mediastinal contours. Aortic atherosclerosis. Patchy bibasilar opacities without significant interval change. There is mild background bronchial thickening. Opacity abutting the left costophrenic angle represents a prominent epicardial fat pad on CT. Mild peribronchial thickening. No pneumothorax. Stable osseous structures. IMPRESSION: Unchanged patchy bibasilar opacities,  atelectasis versus pneumonia. Electronically Signed   By: Narda Rutherford M.D.   On: 10/10/2020 00:37   DG Abd Portable 1V  Result Date: 10/10/2020 CLINICAL DATA:  Constipation. EXAM: PORTABLE ABDOMEN - 1 VIEW COMPARISON:  10/09/2020 FINDINGS: An enteric tube remains in place terminating over the stomach. A large amount of stool remains in the right colon. Small bowel dilatation is unchanged. Oral contrast material remains in dilated small bowel loops without passage to the colon. IMPRESSION: Unchanged small bowel dilatation without progression of oral contrast material. Electronically Signed   By: Sebastian Ache M.D.   On: 10/10/2020 08:05    Anti-infectives: Anti-infectives (From admission, onward)    Start     Dose/Rate Route Frequency Ordered Stop   10/10/20 2000  ceFEPIme (MAXIPIME) 2 g in sodium chloride 0.9 % 100 mL IVPB        2 g 200 mL/hr over 30 Minutes Intravenous Every 24 hours 10/10/20 1812     10/10/20 1930  vancomycin (VANCOREADY) IVPB 1500 mg/300 mL        1,500 mg 150 mL/hr over 120 Minutes Intravenous  Once 10/10/20 1812 10/10/20 2045   10/10/20 1838  vancomycin variable dose per unstable renal function (pharmacist dosing)         Does not apply See admin instructions 10/10/20 1838     10/09/20 1000  fluconazole (DIFLUCAN) tablet 200 mg        200 mg Per Tube Daily 10/09/20 0826 10/10/20 0903   10/08/20 1000  fluconazole (DIFLUCAN) tablet 200 mg  Status:  Discontinued        200 mg Oral Daily  10/07/20 1426 10/09/20 0826   10/03/20 1700  fluconazole (DIFLUCAN) tablet 200 mg  Status:  Discontinued        200 mg Oral Daily 10/03/20 1606 10/07/20 1426   09/23/20 1800  cefTRIAXone (ROCEPHIN) 2 g in sodium chloride 0.9 % 100 mL IVPB        2 g 200 mL/hr over 30 Minutes Intravenous Every 24 hours 09/22/20 1842 09/29/20 1749   09/23/20 1800  azithromycin (ZITHROMAX) 500 mg in sodium chloride 0.9 % 250 mL IVPB  Status:  Discontinued        500 mg 250 mL/hr over 60 Minutes  Intravenous Every 24 hours 09/22/20 1842 09/25/20 1456   09/22/20 1630  cefTRIAXone (ROCEPHIN) 1 g in sodium chloride 0.9 % 100 mL IVPB        1 g 200 mL/hr over 30 Minutes Intravenous  Once 09/22/20 1618 09/22/20 1806   09/22/20 1630  azithromycin (ZITHROMAX) 500 mg in sodium chloride 0.9 % 250 mL IVPB        500 mg 250 mL/hr over 60 Minutes Intravenous  Once 09/22/20 1618 09/22/20 1939       Assessment/Plan: Severe constipation  COPD with PNA AKI A. Fib with RVR UTI RML pulmonary nodule  Still with minimal bowel function. Will continue NPO and NG and repeat films tomorrow   Abigail Miyamoto MD 10/11/2020

## 2020-10-12 ENCOUNTER — Inpatient Hospital Stay (HOSPITAL_COMMUNITY): Payer: Medicare HMO

## 2020-10-12 DIAGNOSIS — R579 Shock, unspecified: Secondary | ICD-10-CM | POA: Diagnosis present

## 2020-10-12 DIAGNOSIS — J9602 Acute respiratory failure with hypercapnia: Secondary | ICD-10-CM | POA: Diagnosis not present

## 2020-10-12 DIAGNOSIS — Z7189 Other specified counseling: Secondary | ICD-10-CM | POA: Diagnosis not present

## 2020-10-12 DIAGNOSIS — J9621 Acute and chronic respiratory failure with hypoxia: Secondary | ICD-10-CM | POA: Diagnosis not present

## 2020-10-12 DIAGNOSIS — J9601 Acute respiratory failure with hypoxia: Secondary | ICD-10-CM | POA: Diagnosis not present

## 2020-10-12 DIAGNOSIS — R778 Other specified abnormalities of plasma proteins: Secondary | ICD-10-CM | POA: Diagnosis not present

## 2020-10-12 DIAGNOSIS — R6521 Severe sepsis with septic shock: Secondary | ICD-10-CM | POA: Diagnosis not present

## 2020-10-12 DIAGNOSIS — A419 Sepsis, unspecified organism: Secondary | ICD-10-CM | POA: Diagnosis not present

## 2020-10-12 DIAGNOSIS — K567 Ileus, unspecified: Secondary | ICD-10-CM | POA: Diagnosis not present

## 2020-10-12 DIAGNOSIS — I4891 Unspecified atrial fibrillation: Secondary | ICD-10-CM | POA: Diagnosis not present

## 2020-10-12 LAB — COMPREHENSIVE METABOLIC PANEL
ALT: 92 U/L — ABNORMAL HIGH (ref 0–44)
AST: 49 U/L — ABNORMAL HIGH (ref 15–41)
Albumin: 2.3 g/dL — ABNORMAL LOW (ref 3.5–5.0)
Alkaline Phosphatase: 65 U/L (ref 38–126)
Anion gap: 12 (ref 5–15)
BUN: 63 mg/dL — ABNORMAL HIGH (ref 8–23)
CO2: 29 mmol/L (ref 22–32)
Calcium: 8.1 mg/dL — ABNORMAL LOW (ref 8.9–10.3)
Chloride: 98 mmol/L (ref 98–111)
Creatinine, Ser: 0.82 mg/dL (ref 0.44–1.00)
GFR, Estimated: >60 mL/min (ref >60)
Glucose, Bld: 112 mg/dL — ABNORMAL HIGH (ref 70–99)
Potassium: 3 mmol/L — ABNORMAL LOW (ref 3.5–5.1)
Sodium: 139 mmol/L (ref 135–145)
Total Bilirubin: 1.7 mg/dL — ABNORMAL HIGH (ref 0.3–1.2)
Total Protein: 5.6 g/dL — ABNORMAL LOW (ref 6.5–8.1)

## 2020-10-12 LAB — GLUCOSE, CAPILLARY
Glucose-Capillary: 118 mg/dL — ABNORMAL HIGH (ref 70–99)
Glucose-Capillary: 119 mg/dL — ABNORMAL HIGH (ref 70–99)
Glucose-Capillary: 113 mg/dL — ABNORMAL HIGH (ref 70–99)
Glucose-Capillary: 109 mg/dL — ABNORMAL HIGH (ref 70–99)

## 2020-10-12 LAB — CBC
HCT: 42.9 % (ref 36.0–46.0)
Hemoglobin: 14.5 g/dL (ref 12.0–15.0)
MCH: 31.1 pg (ref 26.0–34.0)
MCHC: 33.8 g/dL (ref 30.0–36.0)
MCV: 92.1 fL (ref 80.0–100.0)
Platelets: 256 10*3/uL (ref 150–400)
RBC: 4.66 MIL/uL (ref 3.87–5.11)
RDW: 14.2 % (ref 11.5–15.5)
WBC: 10.9 10*3/uL — ABNORMAL HIGH (ref 4.0–10.5)
nRBC: 0 % (ref 0.0–0.2)

## 2020-10-12 LAB — BLOOD GAS, ARTERIAL
Acid-Base Excess: 6.5 mmol/L — ABNORMAL HIGH (ref 0.0–2.0)
Bicarbonate: 29.9 mmol/L — ABNORMAL HIGH (ref 20.0–28.0)
Drawn by: 308601
FIO2: 55
O2 Saturation: 92.9 %
Patient temperature: 98.6
pCO2 arterial: 39.6 mmHg (ref 32.0–48.0)
pH, Arterial: 7.491 — ABNORMAL HIGH (ref 7.350–7.450)
pO2, Arterial: 67.6 mmHg — ABNORMAL LOW (ref 83.0–108.0)

## 2020-10-12 LAB — CORTISOL: Cortisol, Plasma: 38.3 ug/dL

## 2020-10-12 LAB — VANCOMYCIN, RANDOM: Vancomycin Rm: 10

## 2020-10-12 LAB — LACTIC ACID, PLASMA: Lactic Acid, Venous: 1.1 mmol/L (ref 0.5–1.9)

## 2020-10-12 MED ORDER — DIGOXIN 0.25 MG/ML IJ SOLN
0.0625 mg | Freq: Three times a day (TID) | INTRAMUSCULAR | Status: AC
  Administered 2020-10-12 – 2020-10-13 (×2): 0.0625 mg via INTRAVENOUS
  Filled 2020-10-12 (×2): qty 2

## 2020-10-12 MED ORDER — DIGOXIN 0.25 MG/ML IJ SOLN
0.1250 mg | Freq: Once | INTRAMUSCULAR | Status: AC
  Administered 2020-10-12: 0.125 mg via INTRAVENOUS
  Filled 2020-10-12: qty 2

## 2020-10-12 MED ORDER — SODIUM CHLORIDE 0.9 % IV SOLN
2.0000 g | Freq: Two times a day (BID) | INTRAVENOUS | Status: DC
  Administered 2020-10-12 – 2020-10-13 (×4): 2 g via INTRAVENOUS
  Filled 2020-10-12 (×4): qty 2

## 2020-10-12 MED ORDER — KETOROLAC TROMETHAMINE 15 MG/ML IJ SOLN
7.5000 mg | Freq: Three times a day (TID) | INTRAMUSCULAR | Status: DC | PRN
  Administered 2020-10-12: 7.5 mg via INTRAVENOUS
  Filled 2020-10-12: qty 1

## 2020-10-12 MED ORDER — POTASSIUM CHLORIDE 20 MEQ PO PACK
40.0000 meq | PACK | Freq: Once | ORAL | Status: AC
  Administered 2020-10-12: 40 meq
  Filled 2020-10-12: qty 2

## 2020-10-12 MED ORDER — POTASSIUM CHLORIDE CRYS ER 20 MEQ PO TBCR: 40.0000 meq | EXTENDED_RELEASE_TABLET | Freq: Once | ORAL | Status: DC

## 2020-10-12 MED ORDER — LACTATED RINGERS IV BOLUS
500.0000 mL | Freq: Once | INTRAVENOUS | Status: AC
  Administered 2020-10-12: 500 mL via INTRAVENOUS

## 2020-10-12 MED ORDER — LACTATED RINGERS IV BOLUS
1000.0000 mL | Freq: Once | INTRAVENOUS | Status: AC
  Administered 2020-10-12: 1000 mL via INTRAVENOUS

## 2020-10-12 MED ORDER — HYDROCORTISONE SOD SUC (PF) 100 MG IJ SOLR
100.0000 mg | Freq: Two times a day (BID) | INTRAMUSCULAR | Status: AC
  Administered 2020-10-12 – 2020-10-14 (×6): 100 mg via INTRAVENOUS
  Filled 2020-10-12 (×6): qty 2

## 2020-10-12 NOTE — Progress Notes (Signed)
Subjective/Chief Complaint: Continues to be hypotensive and on pressors Had several BM's Denies abdominal pain this morning   Objective: Vital signs in last 24 hours: Temp:  [96.7 F (35.9 C)-98.6 F (37 C)] 97.6 F (36.4 C) (09/11 0700) Pulse Rate:  [76-133] 115 (09/11 0748) Resp:  [15-42] 27 (09/11 0748) BP: (74-114)/(48-84) 96/63 (09/11 0700) SpO2:  [87 %-98 %] 92 % (09/11 0748) FiO2 (%):  [55 %] 55 % (09/11 0748) Weight:  [87.2 kg-89.9 kg] 87.2 kg (09/11 0400) Last BM Date: 10/11/20 (smear)  Intake/Output from previous day: 09/10 0701 - 09/11 0700 In: 3194.2 [I.V.:2096.6; IV Piggyback:1097.6] Out: 2100 [Urine:1500; Emesis/NG output:600] Intake/Output this shift: No intake/output data recorded.  Exam: Awake Appears weak Abdomen obese, softer than yesterday with minimal guarding, no peritonitis  Lab Results:  Recent Labs    10/10/20 0233 10/11/20 0742  WBC 21.7* 14.8*  HGB 16.8* 14.7  HCT 48.4* 43.0  PLT 329 267   BMET Recent Labs    10/10/20 0233 10/11/20 0742  NA 133* 138  K 4.5 3.5  CL 84* 92*  CO2 31 32  GLUCOSE 135* 110*  BUN 110* 105*  CREATININE 2.47* 1.69*  CALCIUM 8.5* 8.2*   PT/INR No results for input(s): LABPROT, INR in the last 72 hours. ABG No results for input(s): PHART, HCO3 in the last 72 hours.  Invalid input(s): PCO2, PO2  Studies/Results: No results found.  Anti-infectives: Anti-infectives (From admission, onward)    Start     Dose/Rate Route Frequency Ordered Stop   10/10/20 2000  ceFEPIme (MAXIPIME) 2 g in sodium chloride 0.9 % 100 mL IVPB        2 g 200 mL/hr over 30 Minutes Intravenous Every 24 hours 10/10/20 1812     10/10/20 1930  vancomycin (VANCOREADY) IVPB 1500 mg/300 mL        1,500 mg 150 mL/hr over 120 Minutes Intravenous  Once 10/10/20 1812 10/10/20 2045   10/10/20 1838  vancomycin variable dose per unstable renal function (pharmacist dosing)         Does not apply See admin instructions 10/10/20  1838     10/09/20 1000  fluconazole (DIFLUCAN) tablet 200 mg        200 mg Per Tube Daily 10/09/20 0826 10/10/20 0903   10/08/20 1000  fluconazole (DIFLUCAN) tablet 200 mg  Status:  Discontinued        200 mg Oral Daily 10/07/20 1426 10/09/20 0826   10/03/20 1700  fluconazole (DIFLUCAN) tablet 200 mg  Status:  Discontinued        200 mg Oral Daily 10/03/20 1606 10/07/20 1426   09/23/20 1800  cefTRIAXone (ROCEPHIN) 2 g in sodium chloride 0.9 % 100 mL IVPB        2 g 200 mL/hr over 30 Minutes Intravenous Every 24 hours 09/22/20 1842 09/29/20 1749   09/23/20 1800  azithromycin (ZITHROMAX) 500 mg in sodium chloride 0.9 % 250 mL IVPB  Status:  Discontinued        500 mg 250 mL/hr over 60 Minutes Intravenous Every 24 hours 09/22/20 1842 09/25/20 1456   09/22/20 1630  cefTRIAXone (ROCEPHIN) 1 g in sodium chloride 0.9 % 100 mL IVPB        1 g 200 mL/hr over 30 Minutes Intravenous  Once 09/22/20 1618 09/22/20 1806   09/22/20 1630  azithromycin (ZITHROMAX) 500 mg in sodium chloride 0.9 % 250 mL IVPB        500 mg 250 mL/hr over 60  Minutes Intravenous  Once 09/22/20 1618 09/22/20 1939       Assessment/Plan: Severe constipation   COPD with PNA AKI A. Fib with RVR UTI RML pulmonary nodule  WBC decreased, lactic acid normal. Bowel function returning but still with dilated loops of small bowel on film this morning.   Continuing NG and NPO    Abigail Miyamoto MD 10/12/2020

## 2020-10-12 NOTE — Progress Notes (Signed)
No bipap for Pt she currently has NG tube in place. Pt will continue to wear HHFNC for tonight.

## 2020-10-12 NOTE — Consult Note (Signed)
Consultation Note Date: 10/12/2020   Patient Name: Kimberly Ballard  DOB: 1940/02/12  MRN: [id number]  Age / Sex: 80 y.o., female  PCP: System, Provider Not In Referring Physician: Florencia Reasons, MD  Reason for Consultation: Establishing goals of care  HPI/Patient Profile: 80 y.o. female  with past medical history of COPD, A. fib on Eliquis, and former tobacco use admitted on 09/22/2020 with dyspnea, aches and cough after COVID exposure.  She has had a prolonged hospital course after developing progressive respiratory failure from which she has become high flow nasal cannula dependent.  She has been treated for multifocal pneumonia.  She is also intermittently requiring BiPAP.  She has developed generalized ileus.  She has diastolic heart failure and atrial fibrillation on amiodarone and diltiazem.  She has been hypotensive on IV pressors.  She has AKI which is improving.  Unfortunately, she is not making significant gains or improvement.  Palliative consulted for goals of care.  Clinical Assessment and Goals of Care: Palliative care consult received.  Chart reviewed including personal review of pertinent labs and imaging.  I met today with Kimberly Ballard.  She was lying in bed with increased work of breathing that made it difficult for her to participate in conversation.  She indicated to me that she is hard of hearing and she did appropriately answer questions but only with 1 or 2 word answers.  It was difficult to obtain history, but we did review her clinical course this hospitalization.  We discussed that she is unfortunately not making significant gains in her recovery.  This is frustrating to her.  We discussed that pathways forward would be improvement in her overall clinical condition, worsening of her clinical condition, or her condition staying the same.  Discussed that the other factor in her course moving forward will  be her feelings on if continued hospitalization and aggressive care is benefiting her.  She seemed to understand this conversation but did not make any concrete statements as to her goals or thoughts moving forward.  We discussed that her daughter is the person that helps her make medical decisions.  She is in agreement with the plan for me to reach out to her daughter to try and arrange a family meeting over the next day or 2 to discuss long-term goals moving forward.  SUMMARY OF RECOMMENDATIONS   -DNR/DNI -Continue current care.  We discussed concern that she is not making significant improvement despite maximal medical intervention.  She indicates that she understands and is frustrated by this. -We will reach out to family to see about arranging a family meeting to further discuss long-term goals.  She expresses that her daughter is the person whom she feels would be most beneficial to help her make decisions about plan of care moving forward.  Code Status/Advance Care Planning: DNR  Palliative Prophylaxis:  Aspiration, Delirium Protocol, and Frequent Pain Assessment  Psycho-social/Spiritual:  Desire for further Chaplaincy support: Not addressed today  Prognosis:  Guarded  Discharge Planning: To Be Determined  Primary Diagnoses: Present on Admission:  CAP (community acquired pneumonia)  Acute respiratory failure with hypoxia and hypercapnia (HCC)  Sepsis ([place])  Elevated LFTs  Cardiomegaly  Elevated troponin   I have reviewed the medical record, interviewed the patient and family, and examined the patient. The following aspects are pertinent.  Past Medical History:  Diagnosis Date   COPD (chronic obstructive pulmonary disease) ([place])    Social History   Socioeconomic History   Marital status: Divorced    Spouse name: Not on file   Number of children: Not on file   Years of education: Not on file   Highest education level: Not on file  Occupational History   Not  on file  Tobacco Use   Smoking status: Former    Types: Cigarettes   Smokeless tobacco: Never  Vaping Use   Vaping Use: Never used  Substance and Sexual Activity   Alcohol use: Yes    Alcohol/week: 2.0 standard drinks    Types: 2 Glasses of wine per week   Drug use: Never   Sexual activity: Not on file  Other Topics Concern   Not on file  Social History Narrative   Not on file   Social Determinants of Health   Financial Resource Strain: Not on file  Food Insecurity: Not on file  Transportation Needs: Not on file  Physical Activity: Not on file  Stress: Not on file  Social Connections: Not on file   Family History  Problem Relation Age of Onset   Diabetes Neg Hx    CAD Neg Hx    Scheduled Meds:  bisacodyl  10 mg Rectal BID   budesonide (PULMICORT) nebulizer solution  0.5 mg Nebulization BID   chlorhexidine  15 mL Mouth Rinse BID   Chlorhexidine Gluconate Cloth  6 each Topical Daily   Gerhardt's butt cream   Topical TID   guaiFENesin  15 mL Per Tube QID   heparin injection (subcutaneous)  5,000 Units Subcutaneous Q8H   insulin aspart  0-9 Units Subcutaneous TID WC   levalbuterol  1.25 mg Nebulization Q6H   mouth rinse  15 mL Mouth Rinse q12n4p   polyethylene glycol  17 g Per Tube TID   revefenacin  175 mcg Nebulization Daily   vancomycin variable dose per unstable renal function (pharmacist dosing)   Does not apply See admin instructions   Continuous Infusions:  sodium chloride Stopped (09/29/20 1824)   sodium chloride Stopped (10/10/20 0309)   amiodarone 30 mg/hr (10/11/20 2312)   ceFEPime (MAXIPIME) IV Stopped (10/11/20 1957)   dextrose 5 % and 0.45% NaCl 10 mL/hr at 10/11/20 1502   phenylephrine (NEO-SYNEPHRINE) Adult infusion 80 mcg/min (10/12/20 0655)   PRN Meds:.sodium chloride, oxybutynin, simethicone, sodium chloride Medications Prior to Admission:  Prior to Admission medications   Medication Sig Start Date End Date Taking? Authorizing Provider   aspirin 81 MG EC tablet Take 81 mg by mouth daily.   Yes [provider]  PAXLOVID, 300/100, 20 x 150 MG & 10 x 100MG TBPK Take 3 tablets by mouth 2 (two) times daily. 09/18/20  Yes [provider]  TRELEGY ELLIPTA 100-62.5-25 MCG/INH AEPB Inhale 1 puff into the lungs daily. 08/26/20  Yes [provider]   No Known Allergies Review of Systems  Constitutional:  Positive for fatigue.  Respiratory:  Positive for shortness of breath.   Neurological:  Positive for weakness.  Psychiatric/Behavioral:  Positive for sleep disturbance.    Physical Exam General: Alert, awake, pursed lips  breathing.  NGT in place. Heart: Tachycardic. No murmur appreciated. Lungs: Decreased air movement, clear Abdomen: Soft, nontender, nondistended, positive bowel sounds.   Ext: +edema Skin: Warm and dry Neuro: Grossly intact, nonfocal.   Vital Signs: BP 96/63   Pulse (!) 116   Temp 97.6 F (36.4 C) (Axillary)   Resp (!) 28   Ht _0  (1.6 m)   Wt 87.2 kg   SpO2 92%   BMI 34.05 kg/m  Pain Scale: 0-10   Pain Score: 0-No pain   SpO2: SpO2: 92 % O2 Device:SpO2: 92 % O2 Flow Rate: .O2 Flow Rate (L/min): 25 L/min  IO: Intake/output summary:  Intake/Output Summary (Last 24 hours) at 10/12/2020 0742 Last data filed at 10/12/2020 0600 Gross per 24 hour  Intake 3194.18 ml  Output 2100 ml  Net 1094.18 ml    LBM: Last BM Date: 10/11/20 (smear) Baseline Weight: Weight: 90.9 kg Most recent weight: Weight: 87.2 kg     Palliative Assessment/Data:   Flowsheet Rows    Flowsheet Row Most Recent Value  Intake Tab   Referral Department Hospitalist  Unit at Time of Referral ICU  Palliative Care Primary Diagnosis Sepsis/Infectious Disease  Date Notified 10/09/20  Palliative Care Type New Palliative care  Reason for referral Clarify Goals of Care  Date of Admission 09/22/20  Date first seen by Palliative Care 10/11/20  # of days Palliative referral response time 2 Day(s)  # of  days IP prior to Palliative referral 17  Clinical Assessment   Palliative Performance Scale Score 30%  Psychosocial & Spiritual Assessment   Palliative Care Outcomes   Patient/Family meeting held? Yes  [name]       Time In: 1030 Time Out: 1130 Time Total: 60 Greater than 50%  of this time was spent counseling and coordinating care related to the above assessment and plan.  Signed by: Micheline Rough, MD   Please contact Palliative Medicine Team phone at [phone] for questions and concerns.  For individual provider: See Shea Evans

## 2020-10-12 NOTE — Progress Notes (Signed)
Subjective: Patient has had some bowel movements as per documentation, however as per her nurse, except 1 bowel movement today, others have been liquid and very small in amount. She continues to be on IV pressors for hypotension. Wall suction canister has bilious fluid, 600 cc output in the last 24 hours. She complains of feeling weak.   Objective: Vital signs in last 24 hours: Temp:  [96.7 F (35.9 C)-98.6 F (37 C)] 97.6 F (36.4 C) (09/11 0700) Pulse Rate:  [76-135] 128 (09/11 1202) Resp:  [15-38] 26 (09/11 1202) BP: (74-111)/(52-82) 107/82 (09/11 1202) SpO2:  [87 %-100 %] 94 % (09/11 1202) FiO2 (%):  [55 %] 55 % (09/11 0748) Weight:  [87.2 kg-90.8 kg] 90.8 kg (09/11 1000) Weight change:  Last BM Date: 10/11/20 (smear)  PE: Deconditioned, ill-appearing, on oxygen via nasal cannula, slightly tachypneic GENERAL: Able to speak a few words not full sentences  ABDOMEN: Distended, very rare tympanitic bowel sounds, mild generalized tenderness EXTREMITIES: No deformity  Lab Results: Results for orders placed or performed during the hospital encounter of 09/22/20 (from the past 48 hour(s))  Lactic acid, plasma     Status: None   Collection Time: 10/10/20 12:25 PM  Result Value Ref Range   Lactic Acid, Venous 1.5 0.5 - 1.9 mmol/L    Comment: Performed at [facility], [address] [place], [place] [id number]  Procalcitonin - Baseline     Status: None   Collection Time: 10/10/20 12:25 PM  Result Value Ref Range   Procalcitonin 2.60 ng/mL    Comment:        Interpretation: PCT > 2 ng/mL: Systemic infection (sepsis) is likely, unless other causes are known. (NOTE)       Sepsis PCT Algorithm           Lower Respiratory Tract                                      Infection PCT Algorithm    ----------------------------     ----------------------------         PCT < 0.25 ng/mL                PCT < 0.10 ng/mL          Strongly encourage             Strongly  discourage   discontinuation of antibiotics    initiation of antibiotics    ----------------------------     -----------------------------       PCT 0.25 - 0.50 ng/mL            PCT 0.10 - 0.25 ng/mL               OR       >80% decrease in PCT            Discourage initiation of                                            antibiotics      Encourage discontinuation           of antibiotics    ----------------------------     -----------------------------         PCT >= 0.50 ng/mL  PCT 0.26 - 0.50 ng/mL               AND       <80% decrease in PCT              Encourage initiation of                                             antibiotics       Encourage continuation           of antibiotics    ----------------------------     -----------------------------        PCT >= 0.50 ng/mL                  PCT > 0.50 ng/mL               AND         increase in PCT                  Strongly encourage                                      initiation of antibiotics    Strongly encourage escalation           of antibiotics                                     -----------------------------                                           PCT <= 0.25 ng/mL                                                 OR                                        > 80% decrease in PCT                                      Discontinue / Do not initiate                                             antibiotics  Performed at [facility], [address], [place], [place] [address]   Troponin I (High Sensitivity)     Status: Abnormal   Collection Time: 10/10/20 12:25 PM  Result Value Ref Range   Troponin I (High Sensitivity) 145 (HH) <18 ng/L    Comment: CRITICAL RESULT CALLED TO, READ BACK BY AND VERIFIED WITH: VARNER,C. RN  ON 09.09.2022 BY COHEN,K (NOTE) Elevated high sensitivity troponin I (hsTnI) values and significant  changes across  serial measurements may suggest ACS but many other   chronic and acute conditions are known to elevate hsTnI results.  Refer to the Links section for chest pain algorithms and additional  guidance. Performed at [facility], [address] [place], [place] [address]   Glucose, capillary     Status: Abnormal   Collection Time: 10/10/20 12:27 PM  Result Value Ref Range   Glucose-Capillary 128 (H) 70 - 99 mg/dL    Comment: Glucose reference range applies only to samples taken after fasting for at least 8 hours.  SAR CoV2 Serology (COVID 19)AB(IGG)IA     Status: None   Collection Time: 10/10/20 12:40 PM  Result Value Ref Range   SARS-CoV-2 Ab, IgG NON REACTIVE NON REACTIVE    Comment: (NOTE) Non-Reactive for SARS-CoV-2 IgG Antibodies.   SARS-CoV-2 IgG antibodies not detected.  Negative results do not preclude acute SARS-CoV-2 infection.  Negative results may occur in samples collected too soon following infection or in immunosuppressed patients.  Serologic results should not be used to diagnose or exclude active/recent SARS-CoV-2 infection.  If acute infection is suspected, direct testing for SARS-CoV-2 is necessary.     The expected result is Non-Reactive.   Fact Sheet for Recipients:  MarketingSheets.si   Fact Sheet for Healthcare Providers:  http://knight-sullivan.biz/   Testing was performed using the Beck[employer] Coulter SARS-CoV-2 IgG assay.  This test is not yet approved or cleared by the [place] and has been authorized by FDA under an Emergency Use Authorization (EUA).  This EUA will remain in effect (meaning this test can be used) for the duration of the COVID-19 declaration under Section 564( b)(1) of the Act, 21 U.S.C. section 360bbb-3(b)(1), unless the authorization is terminated or revoked sooner.  Performed at [facility], [address] [place], [place] [address]   Glucose, capillary     Status: Abnormal   Collection Time: 10/10/20  4:17 PM   Result Value Ref Range   Glucose-Capillary 139 (H) 70 - 99 mg/dL    Comment: Glucose reference range applies only to samples taken after fasting for at least 8 hours.   Comment 1 Notify RN    Comment 2 Document in Chart   Glucose, capillary     Status: Abnormal   Collection Time: 10/10/20  9:54 PM  Result Value Ref Range   Glucose-Capillary 115 (H) 70 - 99 mg/dL    Comment: Glucose reference range applies only to samples taken after fasting for at least 8 hours.  Glucose, capillary     Status: Abnormal   Collection Time: 10/11/20  7:33 AM  Result Value Ref Range   Glucose-Capillary 115 (H) 70 - 99 mg/dL    Comment: Glucose reference range applies only to samples taken after fasting for at least 8 hours.  Basic metabolic panel     Status: Abnormal   Collection Time: 10/11/20  7:42 AM  Result Value Ref Range   Sodium 138 135 - 145 mmol/L   Potassium 3.5 3.5 - 5.1 mmol/L    Comment: DELTA CHECK NOTED NO VISIBLE HEMOLYSIS    Chloride 92 (L) 98 - 111 mmol/L   CO2 32 22 - 32 mmol/L   Glucose, Bld 110 (H) 70 - 99 mg/dL    Comment: Glucose reference range applies only to samples taken after fasting for at least 8 hours.   BUN 105 (H) 8 - 23 mg/dL    Comment: RESULTS CONFIRMED BY MANUAL DILUTION   Creatinine, Ser  1.69 (H) 0.44 - 1.00 mg/dL   Calcium 8.2 (L) 8.9 - 10.3 mg/dL   GFR, Estimated 31 (L) >60 mL/min    Comment: (NOTE) Calculated using the CKD-EPI Creatinine Equation (2021)    Anion gap 14 5 - 15    Comment: Performed at [facility], [address], [place], [place] [address]  CBC with Differential/Platelet     Status: Abnormal   Collection Time: 10/11/20  7:42 AM  Result Value Ref Range   WBC 14.8 (H) 4.0 - 10.5 K/uL   RBC 4.68 3.87 - 5.11 MIL/uL   Hemoglobin 14.7 12.0 - 15.0 g/dL   HCT 47.4 25.9 - 56.3 %   MCV 91.9 80.0 - 100.0 fL   MCH 31.4 26.0 - 34.0 pg   MCHC 34.2 30.0 - 36.0 g/dL   RDW 87.5 64.3 - 32.9 %   Platelets 267 150 - 400 K/uL    nRBC 0.0 0.0 - 0.2 %   Neutrophils Relative % 90 %   Neutro Abs 13.3 (H) 1.7 - 7.7 K/uL   Lymphocytes Relative 4 %   Lymphs Abs 0.6 (L) 0.7 - 4.0 K/uL   Monocytes Relative 5 %   Monocytes Absolute 0.7 0.1 - 1.0 K/uL   Eosinophils Relative 0 %   Eosinophils Absolute 0.1 0.0 - 0.5 K/uL   Basophils Relative 0 %   Basophils Absolute 0.0 0.0 - 0.1 K/uL   Immature Granulocytes 1 %   Abs Immature Granulocytes 0.17 (H) 0.00 - 0.07 K/uL    Comment: Performed at [facility], [address], [place], [place] [address]  Troponin I (High Sensitivity)     Status: Abnormal   Collection Time: 10/11/20  7:42 AM  Result Value Ref Range   Troponin I (High Sensitivity) 104 (HH) <18 ng/L    Comment: CRITICAL VALUE NOTED.  VALUE IS CONSISTENT WITH PREVIOUSLY REPORTED AND CALLED VALUE. (NOTE) Elevated high sensitivity troponin I (hsTnI) values and significant  changes across serial measurements may suggest ACS but many other  chronic and acute conditions are known to elevate hsTnI results.  Refer to the Links section for chest pain algorithms and additional  guidance. Performed at [facility], [address], [place], [place] [address]   Glucose, capillary     Status: Abnormal   Collection Time: 10/11/20 12:00 PM  Result Value Ref Range   Glucose-Capillary 125 (H) 70 - 99 mg/dL    Comment: Glucose reference range applies only to samples taken after fasting for at least 8 hours.  Glucose, capillary     Status: Abnormal   Collection Time: 10/11/20  4:01 PM  Result Value Ref Range   Glucose-Capillary 117 (H) 70 - 99 mg/dL    Comment: Glucose reference range applies only to samples taken after fasting for at least 8 hours.  Glucose, capillary     Status: Abnormal   Collection Time: 10/11/20  9:22 PM  Result Value Ref Range   Glucose-Capillary 118 (H) 70 - 99 mg/dL    Comment: Glucose reference range applies only to samples taken after fasting for at least 8  hours.  Vancomycin, random     Status: None   Collection Time: 10/12/20  2:56 AM  Result Value Ref Range   Vancomycin Rm 10     Comment:        Random Vancomycin therapeutic range is dependent on dosage and time of specimen collection. A peak range is 20.0-40.0 ug/mL A trough range  is 5.0-15.0 ug/mL        Performed at [facility], [address], [place], [place] [address]   Lactic acid, plasma     Status: None   Collection Time: 10/12/20  4:00 AM  Result Value Ref Range   Lactic Acid, Venous 1.1 0.5 - 1.9 mmol/L    Comment: Performed at [facility], [address] [place], [place] [address]  Glucose, capillary     Status: Abnormal   Collection Time: 10/12/20  8:17 AM  Result Value Ref Range   Glucose-Capillary 118 (H) 70 - 99 mg/dL    Comment: Glucose reference range applies only to samples taken after fasting for at least 8 hours.   Comment 1 Notify RN    Comment 2 Document in Chart   CBC     Status: Abnormal   Collection Time: 10/12/20  9:35 AM  Result Value Ref Range   WBC 10.9 (H) 4.0 - 10.5 K/uL   RBC 4.66 3.87 - 5.11 MIL/uL   Hemoglobin 14.5 12.0 - 15.0 g/dL   HCT 38.1 01.7 - 51.0 %   MCV 92.1 80.0 - 100.0 fL   MCH 31.1 26.0 - 34.0 pg   MCHC 33.8 30.0 - 36.0 g/dL   RDW 25.8 52.7 - 78.2 %   Platelets 256 150 - 400 K/uL   nRBC 0.0 0.0 - 0.2 %    Comment: Performed at [facility], [address] [place], [place] [address]  Comprehensive metabolic panel     Status: Abnormal   Collection Time: 10/12/20  9:35 AM  Result Value Ref Range   Sodium 139 135 - 145 mmol/L   Potassium 3.0 (L) 3.5 - 5.1 mmol/L   Chloride 98 98 - 111 mmol/L   CO2 29 22 - 32 mmol/L   Glucose, Bld 112 (H) 70 - 99 mg/dL    Comment: Glucose reference range applies only to samples taken after fasting for at least 8 hours.   BUN 63 (H) 8 - 23 mg/dL   Creatinine, Ser 6.14 0.44 - 1.00 mg/dL   Calcium 8.1 (L) 8.9 - 10.3 mg/dL   Total  Protein 5.6 (L) 6.5 - 8.1 g/dL   Albumin 2.3 (L) 3.5 - 5.0 g/dL   AST 49 (H) 15 - 41 U/L   ALT 92 (H) 0 - 44 U/L   Alkaline Phosphatase 65 38 - 126 U/L   Total Bilirubin 1.7 (H) 0.3 - 1.2 mg/dL   GFR, Estimated >43 >15 mL/min    Comment: (NOTE) Calculated using the CKD-EPI Creatinine Equation (2021)    Anion gap 12 5 - 15    Comment: Performed at [facility], [address] [place], [place] [address]  Glucose, capillary     Status: Abnormal   Collection Time: 10/12/20 12:03 PM  Result Value Ref Range   Glucose-Capillary 119 (H) 70 - 99 mg/dL    Comment: Glucose reference range applies only to samples taken after fasting for at least 8 hours.   Comment 1 Notify RN    Comment 2 Document in Chart     Studies/Results: DG Abd Portable 1V  Result Date: 10/12/2020 CLINICAL DATA:  Follow-up small-bowel obstruction. EXAM: PORTABLE ABDOMEN - 1 VIEW COMPARISON:  10/10/2020 FINDINGS: Increased caliber of multiple dilated loops of small bowel in the mid and upper abdomen measuring up to 6.6 cm in diameter each. Persistent oral contrast in dilated small bowel loops in the pelvis. Those loops  are significantly less dilated with some progression of contrast into the right colon. Unremarkable bones. IMPRESSION: Minimally improved high-grade partial small bowel obstruction. Electronically Signed   By: Beckie Salts M.D.   On: 10/12/2020 10:35    Medications: I have reviewed the patient's current medications.  Assessment: Minimally improved high-grade partial small bowel obstruction Ileus  Multiple comorbidities-A. fib with RVR, on amiodarone, acute diastolic heart failure, hypoxic respiratory failure, hypertension on IV pressors Hypokalemia Improvement in renal function Slightly elevated AST, ALT of 49/92, possible congestive hepatopathy Improving leukocytosis  Plan: Continue n.p.o. status, NG tube to intermittent wall suction, daily abdominal x-ray for monitoring Continue  Dulcolax suppository 10 mg twice a day Continue MiraLAX 17 g 3 times a day via tube, which is to be clamped for an hour after MiraLAX.  Prognosis remains guarded, multiple dilated loops of small bowel measuring up to 6.6 cm with persistent oral contrast and dilated small bowel loops and pelvis-surgery on board   Kerin Salen, MD 10/12/2020, 12:09 PM

## 2020-10-12 NOTE — Progress Notes (Addendum)
NAME:  Kimberly Ballard, MRN:  [id number], DOB:  01/17/1941, LOS: 20 ADMISSION DATE:  09/22/2020, CONSULTATION DATE:  09/23/2020 REFERRING MD:  Adela Glimpse - [facility] CHIEF COMPLAINT:  Dyspnea, cough   BRIEF  80 year old woman presented to [facility] ED 8/22 with dyspnea, body aches, cough after a significant COVID exposure. PMHx significant for AF (on Eliquis), COPD and former tobacco abuse.  Per patient, her daughter was diagnosed with COVID and patient developed symptoms ~8/16.  She discussed concerns with her PCP and paxlovid was prescribed 8/18.  At [facility], she was tested for COVID twice (both negative PCRs) and IgG antibody was also negative. Per PCP notes, patient did receive the primary series of the COVID vaccine as well as 2 subsequent boosters.    Pertinent Medical History:  COPD Atrial fibrillation Former cigarette smoker  Significant Hospital Events: Including procedures, antibiotic start and stop dates in addition to other pertinent events   8/22 - Admit to SDU.  Cough weak.  Pulmonary asked to evaluate given concern for progressive respiratory failure.  COVID-19 and influenza negative. Respiratory viral panel negative.   IV ceftriaxone and azithromycin initiated.  Given IV Solu-Medrol followed by oral prednisone order. 8/23 - Pulmonary asked to evaluate given worsening respiratory failure.  CT chest showing lower lobe consolidation.  There was concern about possible aspiration.  Echo obtained showed good left ventricular function with mild pulmonary artery hypertension. RUQ ultrasound showed hepatic steatosis, possibly cirrhosis based on nodular liver contour, no gallbladder 8/24 - Cardiology consulted for elevated troponins.  This was felt secondary to worsening hypoxia and underlying pulmonary artery hypertension which was felt previously undiagnosed issue.  New onset atrial fibrillation with RVR.  CT angiogram ordered.  Increased oxygen requirement overnight, requiring BiPAP, later placed on 15 L/min  via high flow.  Lower extremity ultrasound negative for DVT 8/25 - Urine strep neg.  Still high FIO2. F/u COVID neg. Transitioned to oral CCB and DOAC. Getting IV lasix. Transitioned to headed High flow [place] during day and BIPAP at night.  8/26 - Still w/ overall net positive fluid balance over hospital stay. CXR w/ worse edema. Lasix increased to 80 mg q 12. Azithromycin stopped (day 5) Steroids reduced.  Cardiology signed off. (See note from cards w/ plan to follow as out-pt)  8/27 - Diuresed, CXR better, eating, got up to chair 8/28 -9/1 Remains on FIO2 80% despite aggressive diuresis. Net neg 11L. 8/31 - NO BUBBLE SHUNT on echo 9/02 - Ongoing High FiO2 requirements. Net -13.7L/admission (-1.6L/24H). Tolerating HFNC/BiPAP and nebulization treatments. New hematuria for which Urology consulted, started CBI. 9/3 - Slight improvement in FiO2 9/4 - PCCM signed off 9/8 - Remains on HHFNC 25L/45% FiO2, pursed-lip breathing. NGT in place for ileus.Reports that her breathing feels more difficult since earlier today. Pursed-lip breathing on exam. Remains on HHFNC 25LPM/45% FiO2. Sats 87-94% today. Belly feels bloated, passing some gas but minimal Bms. NGT remains in place - CCS managing. Mildly TTP over RLQ 9/9 -  35%  L HHFNC, 60% fio2 and worse. Ileus ongoing. On Neo since last night.  Last lopressor and cardizem last night. This morning a Fib RVR and amio gtt started. Covid IgG - still negative Palliative care consult. 9/10 - not on the ventilator.  Is on FiO2 55% with 25 L flow rate on heated high flow nasal cannula.  Is on Cardizem drip, amiodarone drip and also Neo-Synephrine.  Heart rate of 111.- 125. AKi getting better. On Nacl at 69ml/h.  She says  she used BiPAP last night.  She had mentioned abdominal pain.  She is on NG tube with suction.  Repeat COVID IgG negative  Interim History / Subjective:   10/12/2020 -600 cc NG output yesterday.  This morning since 7 AM already with 500 cc NG output.  Still  n.p.o. on KVO fluids.  On Neo-Synephrine 80 mcg [increased from 30 mcg overnight] also on amiodarone infusion.  On 55% FiO2 at 25 L flow rate.  High flow.  AKI significantly improved normalized creatinine.  BUN improved to 60 from 105.  Did get fluid boluses yesterday. Objective   Blood pressure (!) 81/55, pulse (!) 116, temperature 97.6 F (36.4 C), temperature source Axillary, resp. rate (!) 35, height 5\' 3"  (1.6 m), weight 90.8 kg, SpO2 94 %.    FiO2 (%):  [55 %] 55 %   Intake/Output Summary (Last 24 hours) at 10/12/2020 1143 Last data filed at 10/12/2020 0600 Gross per 24 hour  Intake 2854.71 ml  Output 2100 ml  Net 754.71 ml   Filed Weights   10/11/20 1000 10/12/20 0400 10/12/20 1000  Weight: 89.9 kg 87.2 kg 90.8 kg   Physical Examination:  Obese lady in the bed.  Looks deconditioned.  She reports she still is motivated to get better.  Abdomen is distended nonspecifically mildly tender.  Clear to auscultation.  High flow nasal cannula on.  NG tube is draining.  She is in A. fib.- same exam to yesterday    Resolved Hospital Problem List:    Assessment & Plan:  Acute  hypoxemic/hypercarbic respiratory failure with acute recognition of chronic  hypercapnia at admit 09/22/2020  -Presented with bilateral lower lobe infiltrates 09/22/2020 in the setting of COVID exposure and symptoms suggestive of COVID and antiviral treatment [COVID IgG negative as of 10/11/2020 and repeat PCR negative this admission] -this was not due to COVID  -Acute recognition of chronic cor pulmonale this admission based on echo 09/23/2020 PA systolic 48 and mild reduction in RV  -Negative shunt study 10/01/2020    10/12/2020: Continued acute on chronic hypoxic respiratory failure with significant oxygen requirement.  Uses BiPAP at night  Plan -Recheck arterial blood gas 10/08/2020 [last checked 10/02/2020] - Goal O2 sat 88-92% - Bronchodilators as ordered - Pulmonary hygiene with IS, increased mobility - BiPAP  QHS -DC Brovana as nebulizer changed to Xopenex given A. Fib on 10/11/20   COPD -severe Based on PFTs 2019.  Physiologically quite severe with what appears to be chronic hypoxemic respiratory failure, although not on chronic Home o2. S/p steroid taper.  Plan - Continue bronchodilators   Severe constipation, ileus vs. SBO -onset 10/07/20  10/12/2020 - NG output 600cc this am  Plan - Surgery following - NGT remains in place, clamping with sips/chips per CCS - Optimize electrolytes for bowel function (K > 4, Mg  > 2) - bowerl regiment per Tria dand CCS -Pharmacy review for medications that could contribute to ileus [discussed with 12/12/2020 [place]]  - dc yupelri started 09/26/20 due to anti-cholinergic effects and rare   A Fib RVR  and ciculatory shock  10/12/2020 - amio gt and neo.  S/p Dig 1 dose 9/10  Suspect element of contributory volume depletion in the setting of cor pulmonale and high NG output particularly with acute kidney injury improving after fluids. 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 No biopsy. No established diagnosis of portal hypertension. Would put her at risk of potential hepatopulmonary syndrome, although this is not well-established in this patient.  Plan - Outpatient evaluation/follow up as appropriate  Acute kidney injury  10/12/2020: Improving nearly resolve despite circualtory shock an dongoing NG output. Probably improed in response to fluid boluses 10/11/20  Plan - 1L LR bolus (ok per cards) - continue kvo - reaess neo need after fluid bolus   Concern for sepsis in hospital 10/10/20 - empiric abx. No fever  9/11  -PCT slightly high could be false positive due to AKI.   Plan Cefepime 9/9 - end 9/16 ([facility] MD managing) Vanc  9/9 - 9/11 Diflucan 9/2 - 9/9 Ceftriaxone 8/22-8/29 (initial admit pna) Azithro 8/22 - 8/25 (initial admit pna     Best Practice: (right click and "Reselect all SmartList Selections" daily)   Per Primary Team - overall declining. No improvement. Goals of care appopriate  D/w Dr Duke Salvia of cards and Nadeen Landau of [facility]   ATTESTATION & SIGNATURE   The patient ANISA LEANOS is critically ill with multiple organ systems failure and requires high complexity decision making for assessment and support, frequent evaluation and titration of therapies, application of advanced monitoring technologies and extensive interpretation of multiple databases.   Critical Care Time devoted to patient care services described in this note is  35  Minutes. This time reflects time of care of this signee Dr Kalman Shan. This critical care time does not reflect procedure time, or teaching time or supervisory time of PA/NP/Med student/Med Resident etc but could involve care discussion time     Dr. Kalman Shan, M.D., [facility].C.P Pulmonary and Critical Care Medicine Staff Physician [place] [place] [facility] Pager: [phone], If no answer or between  15:00h - 7:00h: call [phone]  10/12/2020 12:17 PM     LABS    PULMONARY No results for input(s): PHART, PCO2ART, PO2ART, HCO3, TCO2, O2SAT in the last 168 hours.  Invalid input(s): PCO2, PO2  CBC Recent Labs  Lab 10/10/20 0233 10/11/20 0742 10/12/20 0935  HGB 16.8* 14.7 14.5  HCT 48.4* 43.0 42.9  WBC 21.7* 14.8* 10.9*  PLT 329 267 256    COAGULATION No results for input(s): INR in the last 168 hours.  CARDIAC  No results for input(s): TROPONINI in the last 168 hours. No results for input(s): PROBNP in the last 168 hours.   CHEMISTRY Recent Labs  Lab 10/08/20 0302 10/09/20 0251 10/10/20 0233 10/11/20 0742 10/12/20 0935  NA 131* 133* 133* 138 139  K 4.9 4.8 4.5 3.5 3.0*  CL 83* 85* 84*  92* 98  CO2 33* 32 31 32 29  GLUCOSE 130* 125* 135* 110* 112*  BUN 61* 82* 110* 105* 63*  CREATININE 1.14* 1.49* 2.47* 1.69* 0.82  CALCIUM 8.7* 9.1 8.5* 8.2* 8.1*  MG  --   --  3.3*  --   --    Estimated Creatinine Clearance: 59.5 mL/min (by C-G formula based on SCr of 0.82 mg/dL).   LIVER Recent Labs  Lab 10/08/20 0302 10/09/20 0251 10/12/20 0935  AST 70* 47* 49*  ALT 124* 95* 92*  ALKPHOS 69 78 65  BILITOT 1.6* 1.8* 1.7*  PROT 6.4* 6.5 5.6*  ALBUMIN 3.0* 2.9* 2.3*     INFECTIOUS Recent Labs  Lab 10/10/20 1225 10/12/20  0400  LATICACIDVEN 1.5 1.1  PROCALCITON 2.60  --      ENDOCRINE CBG (last 3)  Recent Labs    10/11/20 1601 10/11/20 2122 10/12/20 0817  GLUCAP 117* 118* 118*         IMAGING x48h  - image(s) personally visualized  -   highlighted in bold DG Abd Portable 1V  Result Date: 10/12/2020 CLINICAL DATA:  Follow-up small-bowel obstruction. EXAM: PORTABLE ABDOMEN - 1 VIEW COMPARISON:  10/10/2020 FINDINGS: Increased caliber of multiple dilated loops of small bowel in the mid and upper abdomen measuring up to 6.6 cm in diameter each. Persistent oral contrast in dilated small bowel loops in the pelvis. Those loops are significantly less dilated with some progression of contrast into the right colon. Unremarkable bones. IMPRESSION: Minimally improved high-grade partial small bowel obstruction. Electronically Signed   By: Beckie SaltsSteven  Reid M.D.   On: 10/12/2020 10:35

## 2020-10-12 NOTE — Progress Notes (Signed)
PROGRESS NOTE    Kimberly Ballard  HAL:[id number] DOB: 1940/05/23 DOA: 09/22/2020 PCP: System, Provider Not In    Chief Complaint  Patient presents with   Shortness of Breath    Brief Narrative:  Kimberly Ballard is an 80 y.o. female with a history of COPD presented with shortness of breath.  hypoxic respiratory failure.  Report COVID exposure .CTA chest ruled out pulmonary embolism but showed bilateral infiltrates, hospital course complicated by multiple comorbidities listed as below, multiple subspecialty has been following her, she does not appear to make any progress, palliative care consulted as well for goals of care discussion  Subjective:  She denies of abdominal pain, still no bowel movement, not passing gas abdomen remains distended, she is on NG suction 1.5 L urine output documented last 24 hours, no visible blood in the Foley bag Remains on pressor, amiodarone drip and IV antibiotics She is started on maintenance ivf yesterday, she received several fluids boluses in the last 24hrs for hypotension She also received iv dig x1 for afib/rvr She is alert and oriented x3  Assessment & Plan:   Active Problems:   CAP (community acquired pneumonia)   Acute respiratory failure with hypoxia and hypercapnia (HCC)   Sepsis (HCC)   Elevated LFTs   Cardiomegaly   Elevated troponin   Atrial fibrillation with RVR (HCC)   Acute and chronic respiratory failure (acute-on-chronic) (HCC)   Dyspnea   Hypoxemia   Ileus (HCC)   RVF (right ventricular failure) (HCC)  Acute hypoxic/hypercapnic respiratory failure with history of COPD  -she initially required BiPAP , now on high flow nasal cannula -CTA chest ruled out pulmonary embolism, showed multifocal pneumonia - blood culture negative, COVID screening negative COVID serology negative, respiratory viral panel negative, autoimmune panel largely unremarkable -She was initially treated with Rocephin and Zithromax and steroid, repeat chest  x-ray infiltrate no significant improvement, there is no wheezing on exam -Repeat procalcitonin on 9/9 was 2.6 which is elevated compared to before, she was started on Vanco and cefepime -Case discussed with critical care Dr. Marchelle Gearing who recommend continue cefepime for 7 days can stop vancomycin  Severe constipation/ileus versus small bowel obstruction -GI/General surgery following, patient remains on NG suction -No bowel movement, no flatus  A. fib with RVR and hypotension/shock -She is currently on amiodarone drip and Neo-Synephrine drip, she is getting digoxin as needed per cardiology -Management per cardiology  Diastolic CHF Currently does not appear volume overloaded Cardiology following  Gross hematuria, required urology consult and CBI -Currently off CBI, does not appear to have recurrent hematuria  Yeast in the urine Treated with Diflucan  AKI on CKD 2 -Avoid hypotension, continue hydration, renal dosing meds  Right middle lobe nodule -Repeat CT chest in 3 months if appropriate   Nutritional Assessment: The patient's BMI is: Body mass index is 34.05 kg/m.Marland Kitchen Seen by dietician.  I agree with the assessment and plan as outlined below:  Nutrition Status: Nutrition Problem: Inadequate oral intake Etiology: inability to eat Signs/Symptoms: NPO status Interventions: Refer to RD note for recommendations  .    Unresulted Labs (From admission, onward)     Start     Ordered   10/12/20 0754  CBC  ONCE - STAT,   STAT       Question:  Specimen collection method  Answer:  Lab=Lab collect   10/12/20 0753   10/12/20 0754  Comprehensive metabolic panel  ONCE - STAT,   STAT       Question:  Specimen collection method  Answer:  Lab=Lab collect   10/12/20 0753              DVT prophylaxis: heparin injection 5,000 Units Start: 10/09/20 1400   Code Status:DNR Family Communication: none at bedside  Disposition:   Status is: Inpatient   Dispo: The patient is  from: Home              Anticipated d/c is to: To be determined              Anticipated d/c date is: TBD                Consultants:  Critical care GI General surgery Cardiology Urology Palliative care  Procedures:  CBI  Antimicrobials:   Anti-infectives (From admission, onward)    Start     Dose/Rate Route Frequency Ordered Stop   10/10/20 2000  ceFEPIme (MAXIPIME) 2 g in sodium chloride 0.9 % 100 mL IVPB        2 g 200 mL/hr over 30 Minutes Intravenous Every 24 hours 10/10/20 1812     10/10/20 1930  vancomycin (VANCOREADY) IVPB 1500 mg/300 mL        1,500 mg 150 mL/hr over 120 Minutes Intravenous  Once 10/10/20 1812 10/10/20 2045   10/10/20 1838  vancomycin variable dose per unstable renal function (pharmacist dosing)  Status:  Discontinued         Does not apply See admin instructions 10/10/20 1838 10/12/20 0834   10/09/20 1000  fluconazole (DIFLUCAN) tablet 200 mg        200 mg Per Tube Daily 10/09/20 0826 10/10/20 0903   10/08/20 1000  fluconazole (DIFLUCAN) tablet 200 mg  Status:  Discontinued        200 mg Oral Daily 10/07/20 1426 10/09/20 0826   10/03/20 1700  fluconazole (DIFLUCAN) tablet 200 mg  Status:  Discontinued        200 mg Oral Daily 10/03/20 1606 10/07/20 1426   09/23/20 1800  cefTRIAXone (ROCEPHIN) 2 g in sodium chloride 0.9 % 100 mL IVPB        2 g 200 mL/hr over 30 Minutes Intravenous Every 24 hours 09/22/20 1842 09/29/20 1749   09/23/20 1800  azithromycin (ZITHROMAX) 500 mg in sodium chloride 0.9 % 250 mL IVPB  Status:  Discontinued        500 mg 250 mL/hr over 60 Minutes Intravenous Every 24 hours 09/22/20 1842 09/25/20 1456   09/22/20 1630  cefTRIAXone (ROCEPHIN) 1 g in sodium chloride 0.9 % 100 mL IVPB        1 g 200 mL/hr over 30 Minutes Intravenous  Once 09/22/20 1618 09/22/20 1806   09/22/20 1630  azithromycin (ZITHROMAX) 500 mg in sodium chloride 0.9 % 250 mL IVPB        500 mg 250 mL/hr over 60 Minutes Intravenous  Once 09/22/20 1618  09/22/20 1939           Objective: Vitals:   10/12/20 0730 10/12/20 0748 10/12/20 0800 10/12/20 0830  BP: 109/65  94/62 (!) 103/59  Pulse: (!) 116 (!) 115 (!) 107 (!) 129  Resp: 20 (!) 27 (!) 32 (!) 30  Temp:      TempSrc:      SpO2: 95% 92% 93% 96%  Weight:      Height:        Intake/Output Summary (Last 24 hours) at 10/12/2020 0850 Last data filed at 10/12/2020 0600 Gross per 24 hour  Intake 2854.71  ml  Output 2100 ml  Net 754.71 ml   Filed Weights   10/10/20 0500 10/11/20 1000 10/12/20 0400  Weight: 84.8 kg 89.9 kg 87.2 kg    Examination:  General exam: calm, NAD Respiratory system: Diminished overall, no wheezing, no rales, no rhonchi.  Slightly tachypneic, no accessory muscle use Cardiovascular system: S1 & S2 heard, IRRR.  Gastrointestinal system: Abdomen is distended, tight, denies tenderness , hypoactive bowel sounds Central nervous system: Alert and orientedx3.  Extremities:  no edema, bilateral Prevalon boots Skin: No rashes, lesions or ulcers Psychiatry: Judgement and insight appear normal. Mood & affect appropriate.     Data Reviewed: I have personally reviewed following labs and imaging studies  CBC: Recent Labs  Lab 10/06/20 0251 10/08/20 0302 10/09/20 0251 10/10/20 0233 10/11/20 0742  WBC 18.1* 28.4* 31.6* 21.7* 14.8*  NEUTROABS  --   --   --   --  13.3*  HGB 16.7* 17.7* 17.9* 16.8* 14.7  HCT 48.3* 51.1* 52.0* 48.4* 43.0  MCV 91.5 91.4 92.9 92.2 91.9  PLT 209 311 329 329 267    Basic Metabolic Panel: Recent Labs  Lab 10/07/20 0253 10/08/20 0302 10/09/20 0251 10/10/20 0233 10/11/20 0742  NA 128* 131* 133* 133* 138  K 3.2* 4.9 4.8 4.5 3.5  CL 80* 83* 85* 84* 92*  CO2 33* 33* 32 31 32  GLUCOSE 149* 130* 125* 135* 110*  BUN 44* 61* 82* 110* 105*  CREATININE 0.78 1.14* 1.49* 2.47* 1.69*  CALCIUM 8.6* 8.7* 9.1 8.5* 8.2*  MG  --   --   --  3.3*  --     GFR: Estimated Creatinine Clearance: 28.3 mL/min (A) (by C-G formula based  on SCr of 1.69 mg/dL (H)).  Liver Function Tests: Recent Labs  Lab 10/08/20 0302 10/09/20 0251  AST 70* 47*  ALT 124* 95*  ALKPHOS 69 78  BILITOT 1.6* 1.8*  PROT 6.4* 6.5  ALBUMIN 3.0* 2.9*    CBG: Recent Labs  Lab 10/11/20 0733 10/11/20 1200 10/11/20 1601 10/11/20 2122 10/12/20 0817  GLUCAP 115* 125* 117* 118* 118*     No results found for this or any previous visit (from the past 240 hour(s)).       Radiology Studies: No results found.      Scheduled Meds:  bisacodyl  10 mg Rectal BID   budesonide (PULMICORT) nebulizer solution  0.5 mg Nebulization BID   chlorhexidine  15 mL Mouth Rinse BID   Chlorhexidine Gluconate Cloth  6 each Topical Daily   Gerhardt's butt cream   Topical TID   guaiFENesin  15 mL Per Tube QID   heparin injection (subcutaneous)  5,000 Units Subcutaneous Q8H   insulin aspart  0-9 Units Subcutaneous TID WC   levalbuterol  1.25 mg Nebulization Q6H   mouth rinse  15 mL Mouth Rinse q12n4p   polyethylene glycol  17 g Per Tube TID   revefenacin  175 mcg Nebulization Daily   Continuous Infusions:  sodium chloride Stopped (09/29/20 1824)   sodium chloride Stopped (10/10/20 0309)   amiodarone 30 mg/hr (10/11/20 2312)   ceFEPime (MAXIPIME) IV Stopped (10/11/20 1957)   dextrose 5 % and 0.45% NaCl 10 mL/hr at 10/11/20 1502   phenylephrine (NEO-SYNEPHRINE) Adult infusion 80 mcg/min (10/12/20 0655)     LOS: 20 days   Time spent: , case discussed with critical care Dr. Marchelle Gearing over the phone Greater than 50% of this time was spent in counseling, explanation of diagnosis, planning of further management,  and coordination of care.   Voice Recognition Reubin Milan/Dragon dictation system was used to create this note, attempts have been made to correct errors. Please contact the author with questions and/or clarifications.   Albertine GratesFang Terena Bohan, MD PhD FACP [facility]  Available via Epic secure chat 7am-7pm for nonurgent issues Please page for  urgent issues To page the attending provider between 7A-7P or the covering provider during after hours 7P-7A, please log into the web site [email or web address] and access using universal [place] password for that web site. If you do not have the password, please call the hospital operator.    10/12/2020, 8:50 AM

## 2020-10-12 NOTE — Progress Notes (Signed)
CCM update  NG output for day shift 600cc 7pm - 10.20pm:350cc Pressor needs down to aftter hydrocort and 1L LR bolus  Plan  -rrepeat 500cc saline bolus  - avoid anti cholinergic due to ilesu    SIGNATURE    Dr. Kalman Shan, M.D., F.C.C.P,  Pulmonary and Critical Care Medicine Staff Physician, [facility] Director - Interstitial Lung Disease  Program  Pulmonary Fibrosis [facility] at [place], [place], [address]  NPI Number:  NPI #[id number]  Pager: [phone], If no answer  -> Check AMION or Try [phone] Telephone (clinical office): [phone] Telephone (research): [phone]  10:22 PM 10/12/2020

## 2020-10-12 NOTE — Progress Notes (Signed)
Progress Note  Patient Name: Kimberly Ballard Date of Encounter: 10/12/2020  [facility] Cardiologist: Armanda Magic, MD   Subjective   Reports feeling okay.  Breathing is stable.  She denies having any bowel movements, though by report she had several.  Inpatient Medications    Scheduled Meds:  bisacodyl  10 mg Rectal BID   budesonide (PULMICORT) nebulizer solution  0.5 mg Nebulization BID   chlorhexidine  15 mL Mouth Rinse BID   Chlorhexidine Gluconate Cloth  6 each Topical Daily   Gerhardt's butt cream   Topical TID   guaiFENesin  15 mL Per Tube QID   heparin injection (subcutaneous)  5,000 Units Subcutaneous Q8H   insulin aspart  0-9 Units Subcutaneous TID WC   levalbuterol  1.25 mg Nebulization Q6H   mouth rinse  15 mL Mouth Rinse q12n4p   polyethylene glycol  17 g Per Tube TID   revefenacin  175 mcg Nebulization Daily   Continuous Infusions:  sodium chloride Stopped (09/29/20 1824)   sodium chloride Stopped (10/10/20 0309)   amiodarone 30 mg/hr (10/11/20 2312)   ceFEPime (MAXIPIME) IV Stopped (10/11/20 1957)   dextrose 5 % and 0.45% NaCl 10 mL/hr at 10/11/20 1502   phenylephrine (NEO-SYNEPHRINE) Adult infusion 80 mcg/min (10/12/20 0655)   PRN Meds: sodium chloride, oxybutynin, simethicone, sodium chloride   Vital Signs    Vitals:   10/12/20 0730 10/12/20 0748 10/12/20 0800 10/12/20 0830  BP: 109/65  94/62 (!) 103/59  Pulse: (!) 116 (!) 115 (!) 107 (!) 129  Resp: 20 (!) 27 (!) 32 (!) 30  Temp:      TempSrc:      SpO2: 95% 92% 93% 96%  Weight:      Height:        Intake/Output Summary (Last 24 hours) at 10/12/2020 0908 Last data filed at 10/12/2020 0600 Gross per 24 hour  Intake 2854.71 ml  Output 2100 ml  Net 754.71 ml   Last 3 Weights 10/12/2020 10/11/2020 10/10/2020  Weight (lbs) 192 lb 3.9 oz 198 lb 3.1 oz 186 lb 15.2 oz  Weight (kg) 87.2 kg 89.9 kg 84.8 kg      Telemetry    Atrial fibrillation.  Rate 110s to 130s.  PVCs.- Personally  Reviewed  ECG    N/AA- Personally Reviewed  Physical Exam   VS:  BP (!) 103/59   Pulse (!) 129   Temp 97.6 F (36.4 C) (Axillary)   Resp (!) 30   Ht 5\' 3"  (1.6 m)   Wt 87.2 kg   SpO2 96%   BMI 34.05 kg/m  , BMI Body mass index is 34.05 kg/m. GENERAL: Critically ill-appearing. HEENT: Pupils equal round and reactive, fundi not visualized, oral mucosa unremarkable NECK:  + jugular venous distention to mid neck at 45 degrees.  Waveform within normal limits, carotid upstroke brisk and symmetric, no bruits LUNGS:  Clear to auscultation bilaterally HEART: Tachycardic.  Irregularly irregular.  PMI not displaced or sustained,S1 and S2 within normal limits, no S3, no S4, no clicks, no rubs, no murmurs ABD:  distended.  Hypoactive bowel sounds  EXT:  2 plus pulses throughout, no edema, no cyanosis no clubbing SKIN:  No rashes no nodules NEURO:  Cranial nerves II through XII grossly intact, motor grossly intact throughout PSYCH:  Cognitively intact, oriented to person place and time   Labs    High Sensitivity Troponin:   Recent Labs  Lab 09/24/20 0239 10/04/20 1721 10/04/20 1923 10/10/20 1225 10/11/20 12/11/20  TROPONINIHS 52* 59* 67* 145* 104*      Chemistry Recent Labs  Lab 10/08/20 0302 10/09/20 0251 10/10/20 0233 10/11/20 0742  NA 131* 133* 133* 138  K 4.9 4.8 4.5 3.5  CL 83* 85* 84* 92*  CO2 33* 32 31 32  GLUCOSE 130* 125* 135* 110*  BUN 61* 82* 110* 105*  CREATININE 1.14* 1.49* 2.47* 1.69*  CALCIUM 8.7* 9.1 8.5* 8.2*  PROT 6.4* 6.5  --   --   ALBUMIN 3.0* 2.9*  --   --   AST 70* 47*  --   --   ALT 124* 95*  --   --   ALKPHOS 69 78  --   --   BILITOT 1.6* 1.8*  --   --   GFRNONAA 49* 36* 19* 31*  ANIONGAP 15 16* 18* 14     Hematology Recent Labs  Lab 10/09/20 0251 10/10/20 0233 10/11/20 0742  WBC 31.6* 21.7* 14.8*  RBC 5.60* 5.25* 4.68  HGB 17.9* 16.8* 14.7  HCT 52.0* 48.4* 43.0  MCV 92.9 92.2 91.9  MCH 32.0 32.0 31.4  MCHC 34.4 34.7 34.2  RDW  13.8 13.9 14.2  PLT 329 329 267    BNPNo results for input(s): BNP, PROBNP in the last 168 hours.   DDimer No results for input(s): DDIMER in the last 168 hours.   Radiology    No results found.  Cardiac Studies   Complete Echo 09/23/2020: Impressions:  1. Left ventricular ejection fraction, by estimation, is 55 to 60%. Left  ventricular ejection fraction by 3D volume is 59 %. The left ventricle has  normal function. The left ventricle has no regional wall motion  abnormalities. There is moderate left  ventricular hypertrophy. Left ventricular diastolic parameters are  consistent with Grade I diastolic dysfunction (impaired relaxation).   2. Right ventricular systolic function mildly reduced at base and mid,  with relative preserved apical systolic function. The right ventricular  size is moderately enlarged. There is moderately elevated pulmonary artery  systolic pressure. The estimated  right ventricular systolic pressure is 48.4 mmHg.   3. Left atrial size was mildly dilated.   4. Right atrial size was moderately dilated.   5. The mitral valve is normal in structure. Trivial mitral valve  regurgitation. No evidence of mitral stenosis.   6. Tricuspid valve regurgitation is mild to moderate.   7. The aortic valve is normal in structure. Aortic valve regurgitation is  not visualized. No aortic stenosis is present.   8. The inferior vena cava is dilated in size with <50% respiratory  variability, suggesting right atrial pressure of 15 mmHg.  _______________   Limited Echo with Bubble Study 10/01/2020: Impressions: 1. Left ventricular ejection fraction, by estimation, is 60 to 65%. The  left ventricle has normal function. There is moderate concentric left  ventricular hypertrophy. Left ventricular diastolic function could not be  evaluated.   2. The mitral valve is grossly normal. Trivial mitral valve  regurgitation. No evidence of mitral stenosis.   3. The aortic valve is  tricuspid. There is mild calcification of the  aortic valve. Aortic valve regurgitation is trivial. No aortic stenosis is  present.   4. Agitated saline contrast bubble study was negative, with no evidence  of any interatrial shunt.   Conclusion(s)/Recommendation(s): Limited study, no major change from  recent study 09/23/20. Bubble study negative for intraatrial shunt.   Patient Profile     80 y.o. female with COPD and prior tobacco abuse admitted  with multifocal pneumonia and COPD exacerbation.  Cardiology consulted for elevated troponin and atrial fibrillation with RVR.  Assessment & Plan    #New onset atrial fibrillation with RVR: Patient admitted with hypoxic respiratory failure and COPD exacerbation/pneumonia.  She remains hypotensive and not able to tolerate nodal agents.  She is currently on high-dose neo-.  She was started on amiodarone despite her transaminitis and underlying pulmonary disease given lack of other good options.  Her tachycardia and hypotension persist.  They tended to stop amiodarone yesterday but there is no improvement in her blood pressure.  She remains on an IV amiodarone drip.  She did get a dose of IV digoxin and her heart rate seemed to respond well.  BMP is pending.  If renal function is stable to improving, would give another dose today.  Overall goals of care are still being established.  Not currently anticoagulated due to issues with hematuria.  #Acute diastolic heart failure: #RV failure: #Pulmonary hypertension: #Hypoxic respiratory failure: Per her family tested positive for COVID-19 09/2020.  Received treatment for with Paxlovid.  In the hospital she has been negative for COVID-19 and influenza.  She continues to require high flow nasal cannula.  RV failure noted on echo with reduced systolic function and moderately enlarged.  PASP 48 mmHg.  Systolic function was normal.  Right atrial pressure was 15 mmHg on 09/23/2020.  Palliative care is being pursued.   Currently she is receiving phenylephrine via peripheral IV.  Would clarify goals of care.  If aggressive treatment is going to persist, she would benefit from central access as we would be able to also check CVP's and co-ox.  Would not continue peripheral phenylephrine long-term.  #Elevated troponin: High-sensitivity troponin peaked at 128 previously and is up to 145 yesterday.  We will repeat a high-sensitivity troponin today to make sure it is not elevating significantly.  Overall, the pattern most consistent with demand ischemia.  She was noted to have some coronary calcification on a chest CT.  Would consider outpatient stress testing versus coronary CTA.  Likely not contributory to her current hospitalization.  # AKI: Renal function is worsening in the setting of likely mixed cardiogenic and septic shock.  BMP pending.  # Ileus: # Severe constipation: Per notes she had several bowel movements but she denies.  She is being followed by surgery/GI.  NG tube to suction and draining bilious fluid.       For questions or updates, please contact [facility] Please consult [email or web address] for contact info under   Total critical care time: 30 minutes. Critical care time was exclusive of separately billable procedures and treating other patients. Critical care was necessary to treat or prevent imminent or life-threatening deterioration. Critical care was time spent personally by me on the following activities: development of treatment plan with patient and/or surrogate as well as nursing, discussions with consultants, evaluation of patient's response to treatment, examination of patient, obtaining history from patient or surrogate, ordering and performing treatments and interventions, ordering and review of laboratory studies, ordering and review of radiographic studies, pulse oximetry and re-evaluation of patient's condition.      Signed, Chilton Si, MD  10/12/2020, 9:08 AM

## 2020-10-12 NOTE — Progress Notes (Signed)
Pharmacy Antibiotic Note  Kimberly Ballard is a 80 y.o. female admitted on 09/22/2020 with sepsis.  Pharmacy has been consulted for vancomycin and cefepime dosing.  D3 of 2nd course abx, LN 9/11 - noted for sepsis  Cefepime 2 gm incr to q12, plan 7 days per CCM- ordered LD 9/15 1st course abx 8/22 - 29, Azith x3, CTX x8 -   Plan: Increase Cefepime 2 gr to q12 with improved renal fx > entered for total 7 days Discontinue Vancomycin Monitor clinical course, renal function, cultures as available   Height: 5\' 3"  (160 cm) Weight: 90.8 kg (200 lb 2.8 oz) IBW/kg (Calculated) : 52.4  Temp (24hrs), Avg:98 F (36.7 C), Min:96.7 F (35.9 C), Max:98.6 F (37 C)  Recent Labs  Lab 10/08/20 0302 10/09/20 0251 10/10/20 0233 10/10/20 1225 10/11/20 0742 10/12/20 0256 10/12/20 0400 10/12/20 0935  WBC 28.4* 31.6* 21.7*  --  14.8*  --   --  10.9*  CREATININE 1.14* 1.49* 2.47*  --  1.69*  --   --  0.82  LATICACIDVEN  --   --   --  1.5  --   --  1.1  --   VANCORANDOM  --   --   --   --   --  10  --   --      Estimated Creatinine Clearance: 59.5 mL/min (by C-G formula based on SCr of 0.82 mg/dL).    No Known Allergies  8/22 azithromycin >> 8/25 8/22 CTX >> 8/29 9/2 Fluconazole >> 9/9 9/9 vancomycin >> 9/11 9/9 cefepime >>   8/22 BCx: ngf 8/31 UCx: cxt reincubated.  40k yeast  Thank you for allowing pharmacy to be a part of this patient's care.   9/31 PharmD [facility] [phone] 10/12/2020, 11:36 AM

## 2020-10-12 NOTE — Progress Notes (Signed)
Daily Progress Note   Patient Name: Kimberly Ballard       Date: 10/12/2020 DOB: Feb 24, 1940  Age: 80 y.o. MRN#: [id number] Attending Physician: Albertine Grates, MD Primary Care Physician: System, Provider Not In Admit Date: 09/22/2020  Reason for Consultation/Follow-up: Establishing goals of care  Subjective: I saw and examined Kimberly Ballard today.  She was lying in bed in no distress.  She reports that she is feeling tired but not hurting.  Some shortness of breath.  She asked me to call her daughter to discuss.  I called and was able to reach patient's daughter, Kimberly Ballard.  Kimberly Ballard tells me that prior to this hospitalization, his mother was "dancing the jig" in the kitchen with her family.  She tells me that it has been rough on family as patient's husband died in 2022/09/16 on hospice services.  Her mother was coping fairly well, but it has been difficult for her emotionally.  We discussed clinical course as well as wishes moving forward in regard to care plan this hospitalization. Concept of Hospice and Palliative Care were discussed.  Kimberly Ballard tells me that family has been speaking with different doctors, however, at the same time they feels that they really do not have a good picture of everything that is going on with her.  She tells me that when she was first admitted, all that anybody talked about was her breathing.  Then all anybody discussed with them was her heart.  Now she states the only updates to get her regarding her bowel function.  At this time, her family thinks that her bowel issues are related to them causing other problems as well.  I talked with her about potential pathways of her mother improving, failing to improve, or reaching a point where she no longer desires to have aggressive medical  interventions.  She states that she thinks this is where her mother is at emotionally.  Kimberly Ballard feels that family needs to have a chance for meeting with a different specialist caring for her mom to discuss all the different issues going on.  Length of Stay: 20  Current Medications: Scheduled Meds:   bisacodyl  10 mg Rectal BID   budesonide (PULMICORT) nebulizer solution  0.5 mg Nebulization BID   chlorhexidine  15 mL Mouth Rinse BID   Chlorhexidine Gluconate Cloth  6 each Topical Daily   digoxin  0.0625 mg Intravenous Q8H   Gerhardt's butt cream   Topical TID   heparin injection (subcutaneous)  5,000 Units Subcutaneous Q8H   hydrocortisone sod succinate (SOLU-CORTEF) inj  100 mg Intravenous BID   insulin aspart  0-9 Units Subcutaneous TID WC   levalbuterol  1.25 mg Nebulization Q6H   mouth rinse  15 mL Mouth Rinse q12n4p   polyethylene glycol  17 g Per Tube TID    Continuous Infusions:  sodium chloride Stopped (09/29/20 1824)   sodium chloride Stopped (10/10/20 0309)   amiodarone 30 mg/hr (10/12/20 1519)   ceFEPime (MAXIPIME) IV Stopped (10/12/20 1248)   dextrose 5 % and 0.45% NaCl 10 mL/hr at 10/12/20 1519   phenylephrine (NEO-SYNEPHRINE) Adult infusion 60 mcg/min (10/12/20 1600)    PRN Meds: sodium chloride, ketorolac, oxybutynin, simethicone, sodium chloride  Physical Exam         General: Alert, awake, pursed lips breathing.  NGT in place. Heart: Tachycardic. No murmur appreciated. Lungs: Decreased air movement, clear Abdomen: Soft, nontender, nondistended, positive bowel sounds.   Ext: +edema Skin: Warm and dry Neuro: Grossly intact, nonfocal.  Vital Signs: BP (!) 108/57   Pulse (!) 114   Temp 99.2 F (37.3 C) (Oral)   Resp 18   Ht 5\' 3"  (1.6 m)   Wt 90.8 kg   SpO2 94%   BMI 35.46 kg/m  SpO2: SpO2: 94 % O2 Device: O2 Device: High Flow Nasal Cannula O2 Flow Rate: O2 Flow Rate (L/min): 25 L/min  Intake/output summary:  Intake/Output Summary (Last 24  hours) at 10/12/2020 1830 Last data filed at 10/12/2020 1813 Gross per 24 hour  Intake 4127.5 ml  Output 2700 ml  Net 1427.5 ml   LBM: Last BM Date: 10/11/20 (smear) Baseline Weight: Weight: 90.9 kg Most recent weight: Weight: 90.8 kg       Palliative Assessment/Data:    Flowsheet Rows    Flowsheet Row Most Recent Value  Intake Tab   Referral Department Hospitalist  Unit at Time of Referral ICU  Palliative Care Primary Diagnosis Sepsis/Infectious Disease  Date Notified 10/09/20  Palliative Care Type New Palliative care  Reason for referral Clarify Goals of Care  Date of Admission 09/22/20  Date first seen by Palliative Care 10/11/20  # of days Palliative referral response time 2 Day(s)  # of days IP prior to Palliative referral 17  Clinical Assessment   Palliative Performance Scale Score 30%  Psychosocial & Spiritual Assessment   Palliative Care Outcomes   Patient/Family meeting held? Yes  [name]       Patient Active Problem List   Diagnosis Date Noted   Shock circulatory (HCC)    Hypoxemia    Ileus (HCC)    RVF (right ventricular failure) (HCC)    Dyspnea    Acute and chronic respiratory failure (acute-on-chronic) (HCC)    Atrial fibrillation with RVR (HCC)    Elevated troponin 09/23/2020   CAP (community acquired pneumonia) 09/22/2020   Acute respiratory failure with hypoxia and hypercapnia (HCC) 09/22/2020   Septic shock (HCC) 09/22/2020   Elevated LFTs 09/22/2020   Cardiomegaly 09/22/2020    Palliative Care Assessment & Plan   Patient Profile: 80 y.o. female  with past medical history of COPD, A. fib on Eliquis, and former tobacco use admitted on 09/22/2020 with dyspnea, aches and cough after COVID exposure.  She has had a prolonged hospital course after developing progressive respiratory failure from  which she has become high flow nasal cannula dependent.  She has been treated for multifocal pneumonia.  She is also intermittently  requiring BiPAP.  She has developed generalized ileus.  She has diastolic heart failure and atrial fibrillation on amiodarone and diltiazem.  She has been hypotensive on IV pressors.  She has AKI which is improving.  Unfortunately, she is not making significant gains or improvement.  Palliative consulted for goals of care.  Recommendations/Plan: -DNR/DNI -Family is requesting interdisciplinary family meeting with all subspecialists.  While I am not sure this will be possible, I would try to coordinate with primary hospitalist see if we can arrange a joint family meeting.  Goals of Care and Additional Recommendations: Limitations on Scope of Treatment: Full Scope Treatment  Code Status:    Code Status Orders  (From admission, onward)           Start     Ordered   09/27/20 0929  Do not attempt resuscitation (DNR)  Continuous        09/27/20 0928           Code Status History     Date Active Date Inactive Code Status Order ID Comments User Context   09/22/2020 2144 09/27/2020 0928 Full Code [id number]  Therisa Doyne, MD Inpatient       Prognosis:  Unable to determine  Discharge Planning: To Be Determined  Care plan was discussed with patient, daughter  Thank you for allowing the Palliative Medicine Team to assist in the care of this patient.   Total Time 50 Prolonged Time Billed  no       Greater than 50%  of this time was spent counseling and coordinating care related to the above assessment and plan.  Romie Minus, MD  Please contact Palliative Medicine Team phone at [phone] for questions and concerns.

## 2020-10-12 NOTE — Progress Notes (Signed)
eLink Physician-Brief Progress Note Patient Name: Kimberly Ballard DOB: August 08, 1940 MRN: [id number]   Date of Service  10/12/2020  HPI/Events of Note  Low bp.  On peripheral neo.  eICU Interventions  LR bolus  Check lactic acid     Intervention Category Major Interventions: Shock - evaluation and management  Henry Russel, P 10/12/2020, 3:10 AM

## 2020-10-13 ENCOUNTER — Inpatient Hospital Stay (HOSPITAL_COMMUNITY): Payer: Medicare HMO

## 2020-10-13 DIAGNOSIS — R0600 Dyspnea, unspecified: Secondary | ICD-10-CM | POA: Diagnosis not present

## 2020-10-13 DIAGNOSIS — R6521 Severe sepsis with septic shock: Secondary | ICD-10-CM

## 2020-10-13 DIAGNOSIS — J9621 Acute and chronic respiratory failure with hypoxia: Secondary | ICD-10-CM | POA: Diagnosis not present

## 2020-10-13 DIAGNOSIS — R778 Other specified abnormalities of plasma proteins: Secondary | ICD-10-CM | POA: Diagnosis not present

## 2020-10-13 DIAGNOSIS — I517 Cardiomegaly: Secondary | ICD-10-CM | POA: Diagnosis not present

## 2020-10-13 DIAGNOSIS — I4891 Unspecified atrial fibrillation: Secondary | ICD-10-CM | POA: Diagnosis not present

## 2020-10-13 DIAGNOSIS — A419 Sepsis, unspecified organism: Secondary | ICD-10-CM | POA: Diagnosis not present

## 2020-10-13 DIAGNOSIS — R079 Chest pain, unspecified: Secondary | ICD-10-CM

## 2020-10-13 DIAGNOSIS — R579 Shock, unspecified: Secondary | ICD-10-CM | POA: Diagnosis not present

## 2020-10-13 LAB — MAGNESIUM: Magnesium: 2.5 mg/dL — ABNORMAL HIGH (ref 1.7–2.4)

## 2020-10-13 LAB — CBC
HCT: 39.9 % (ref 36.0–46.0)
Hemoglobin: 13.4 g/dL (ref 12.0–15.0)
MCH: 31.2 pg (ref 26.0–34.0)
MCHC: 33.6 g/dL (ref 30.0–36.0)
MCV: 92.8 fL (ref 80.0–100.0)
Platelets: 197 10*3/uL (ref 150–400)
RBC: 4.3 MIL/uL (ref 3.87–5.11)
RDW: 14.2 % (ref 11.5–15.5)
WBC: 8 10*3/uL (ref 4.0–10.5)
nRBC: 0 % (ref 0.0–0.2)

## 2020-10-13 LAB — HEPARIN LEVEL (UNFRACTIONATED): Heparin Unfractionated: 1.1 IU/mL — ABNORMAL HIGH (ref 0.30–0.70)

## 2020-10-13 LAB — COMPREHENSIVE METABOLIC PANEL
ALT: 81 U/L — ABNORMAL HIGH (ref 0–44)
AST: 45 U/L — ABNORMAL HIGH (ref 15–41)
Albumin: 2.1 g/dL — ABNORMAL LOW (ref 3.5–5.0)
Alkaline Phosphatase: 58 U/L (ref 38–126)
Anion gap: 11 (ref 5–15)
BUN: 45 mg/dL — ABNORMAL HIGH (ref 8–23)
CO2: 26 mmol/L (ref 22–32)
Calcium: 7.8 mg/dL — ABNORMAL LOW (ref 8.9–10.3)
Chloride: 98 mmol/L (ref 98–111)
Creatinine, Ser: 0.64 mg/dL (ref 0.44–1.00)
GFR, Estimated: >60 mL/min (ref >60)
Glucose, Bld: 120 mg/dL — ABNORMAL HIGH (ref 70–99)
Potassium: 3.4 mmol/L — ABNORMAL LOW (ref 3.5–5.1)
Sodium: 135 mmol/L (ref 135–145)
Total Bilirubin: 1.3 mg/dL — ABNORMAL HIGH (ref 0.3–1.2)
Total Protein: 5.3 g/dL — ABNORMAL LOW (ref 6.5–8.1)

## 2020-10-13 LAB — GLUCOSE, CAPILLARY
Glucose-Capillary: 124 mg/dL — ABNORMAL HIGH (ref 70–99)
Glucose-Capillary: 124 mg/dL — ABNORMAL HIGH (ref 70–99)
Glucose-Capillary: 127 mg/dL — ABNORMAL HIGH (ref 70–99)
Glucose-Capillary: 108 mg/dL — ABNORMAL HIGH (ref 70–99)

## 2020-10-13 LAB — SAR COV2 SEROLOGY (COVID19)AB(IGG),IA: SARS-CoV-2 Ab, IgG: NONREACTIVE

## 2020-10-13 MED ORDER — HEPARIN BOLUS VIA INFUSION
2000.0000 [IU] | Freq: Once | INTRAVENOUS | Status: AC
  Administered 2020-10-13: 2000 [IU] via INTRAVENOUS
  Filled 2020-10-13: qty 2000

## 2020-10-13 MED ORDER — ZINC OXIDE 40 % EX OINT
TOPICAL_OINTMENT | Freq: Three times a day (TID) | CUTANEOUS | Status: DC
  Administered 2020-10-14 – 2020-10-18 (×5): 1 via TOPICAL
  Filled 2020-10-13 (×2): qty 57

## 2020-10-13 MED ORDER — HEPARIN (PORCINE) 25000 UT/250ML-% IV SOLN
1250.0000 [IU]/h | INTRAVENOUS | Status: DC
  Administered 2020-10-13: 1250 [IU]/h via INTRAVENOUS
  Filled 2020-10-13: qty 250

## 2020-10-13 MED ORDER — LEVALBUTEROL HCL 1.25 MG/0.5ML IN NEBU
1.2500 mg | INHALATION_SOLUTION | Freq: Two times a day (BID) | RESPIRATORY_TRACT | Status: DC
  Administered 2020-10-13 – 2020-10-17 (×9): 1.25 mg via RESPIRATORY_TRACT
  Filled 2020-10-13 (×12): qty 0.5

## 2020-10-13 MED ORDER — POTASSIUM CHLORIDE 20 MEQ PO PACK
40.0000 meq | PACK | Freq: Once | ORAL | Status: AC
  Administered 2020-10-13: 40 meq
  Filled 2020-10-13: qty 2

## 2020-10-13 NOTE — Progress Notes (Signed)
Palliative Care Brief Note  Discussed case with Dr. Benjamine Mola.  She has spoken with and updated daughter today.  Daughter is planning to come to the hospital tomorrow to see Ms. Orsborn.  While I am going off service, I will ask another member of the palliative medicine team to follow up tomorrow, hopefully when her daughter is present.  Romie Minus, MD [facility] Palliative Medicine Team [phone]  NO CHARGE NOTE

## 2020-10-13 NOTE — TOC Progression Note (Signed)
Transition of Care [facility]) - Progression Note    Patient Details  Name: Kimberly Ballard MRN: [id number] Date of Birth: [birth year]  [facility] CM/SW Contact  Golda Acre, RN Phone Number: 10/13/2020, 8:52 AM  Clinical Narrative:    Assessment/Plan Severe constipation/ileus  - NGT placed 9/6 - 925 cc out  - film yesterday with minimal improvement in PSBO secondary to large colonic stool burden  - patient clinically has a little less pain and abdomen feels a little less distended - no indication for emergent surgical intervention and patient is a poor surgical candidate  - recommend keeping K>4.0 and Mg > 2.0 to optimize bowel function - mobilize!! - GI following as well, will defer to their service on medical management of constipation and ileus. We will continue to be available if needed, but do not see any indication for acute surgical intervention at this time    FEN: ice chips and sips, IVF per TRH, NGT clamping as tolerated VTE: SCDs, SQH ID: cefepime 9/11>>   COPD with PNA AKI A. Fib with RVR UTI RML pulmonary nodule TOC PLAN OF CARE: from home will plan to return to home. Following for progression.  Expected Discharge Plan: Home/Self Care Barriers to Discharge: Continued Medical Work up  Expected Discharge Plan and Services Expected Discharge Plan: Home/Self Care   Discharge Planning Services: CM Consult   Living arrangements for the past 2 months: Single Family Home                                       Social Determinants of Health (SDOH) Interventions    Readmission Risk Interventions No flowsheet data found.

## 2020-10-13 NOTE — Progress Notes (Signed)
Physical Therapy Treatment Patient Details Name: Kimberly Ballard MRN: [id number] DOB: 03/17/1940 Today's Date: 10/13/2020   History of Present Illness Patient is a 80 year old female presented on 09/22/20 with shortness of breath, body aches, decreased appetite and coughing. Per DTR patient diagnosed with COVID week previous to admission. PMH includes COPD.  CT chest ruled out PE but shows evidence of bilateral infiltrates.  Patient was started on Rocephin and Zithromax for community-acquired pneumonia.  Subsequently she decompensated and worsened and went into A. fib with RVR and was placed on BiPAP and admitted in the stepdown.   Patient also developed hematuria, urology was consulted.    PT Comments    Pt assisted with rolling to place lift pad.  Pt requiring lift equipment for OOB at this time due to deconditioning and weakness.  Pt with grossly 2+/5 strength throughout as she is able to make minimal movements against gravity.  Continue to recommend SNF upon d/c.   Recommendations for follow up therapy are one component of a multi-disciplinary discharge planning process, led by the attending physician.  Recommendations may be updated based on patient status, additional functional criteria and insurance authorization.  Follow Up Recommendations  SNF     Equipment Recommendations  None recommended by PT    Recommendations for Other Services       Precautions / Restrictions Precautions Precautions: Fall Precaution Comments: monitor vitals; currently on 15L HFNC     Mobility  Bed Mobility Overal bed mobility: Needs Assistance Bed Mobility: Rolling Rolling: Total assist;+2 for physical assistance         General bed mobility comments: pt encouraged to assist as able with rolling, pt initiates with reaching however requiring assist for upper and lower body to complete rolling, SPO2 92-93% on 15L HFNC    Transfers                 General transfer comment: due to low  level and medical complexities, initiated OOB with lift equipment for safety (pt has not been OOB in at least 10 days (if not more))  Ambulation/Gait                 Stairs             Wheelchair Mobility    Modified Rankin (Stroke Patients Only)       Balance                                            Cognition Arousal/Alertness: Awake/alert Behavior During Therapy: Flat affect Overall Cognitive Status: Within Functional Limits for tasks assessed                                        Exercises General Exercises - Upper Extremity Shoulder Flexion: AROM;Both;5 reps;Seated General Exercises - Lower Extremity Ankle Circles/Pumps: AROM;Both;10 reps Short Arc Quad: AROM;Limitations;Both;5 reps;Seated Short Arc Quad Limitations: 5 reps x2, limited active movement Hip Flexion/Marching: AROM;Seated;5 reps;Limitations Hip Flexion/Marching Limitations: limited active movement against gravity however muscles were contracting    General Comments        Pertinent Vitals/Pain Pain Assessment: No/denies pain Pain Intervention(s): Monitored during session;Repositioned    Home Living  Prior Function            PT Goals (current goals can now be found in the care plan section) Acute Rehab PT Goals PT Goal Formulation: With patient Time For Goal Achievement: 10/27/20 Potential to Achieve Goals: Fair Progress towards PT goals: Progressing toward goals    Frequency    Min 2X/week      PT Plan Current plan remains appropriate    Co-evaluation              AM-PAC PT "6 Clicks" Mobility   Outcome Measure  Help needed turning from your back to your side while in a flat bed without using bedrails?: Total Help needed moving from lying on your back to sitting on the side of a flat bed without using bedrails?: Total Help needed moving to and from a bed to a chair (including a wheelchair)?:  Total Help needed standing up from a chair using your arms (e.g., wheelchair or bedside chair)?: Total Help needed to walk in hospital room?: Total Help needed climbing 3-5 steps with a railing? : Total 6 Click Score: 6    End of Session Equipment Utilized During Treatment: Oxygen Activity Tolerance: Patient limited by fatigue Patient left: in chair;with call bell/phone within reach Nurse Communication: Mobility status PT Visit Diagnosis: Muscle weakness (generalized) (M62.81)     Time: 1345-1411 PT Time Calculation (min) (ACUTE ONLY): 26 min  Charges:  $Therapeutic Activity: 8-22 mins                    Thomasene Mohair PT, DPT Acute Rehabilitation Services Pager: [phone] Office: [phone]    Kimberly Ballard [place] 10/13/2020, 3:08 PM

## 2020-10-13 NOTE — Progress Notes (Signed)
SLP Cancellation Note  Patient Details Name: Kimberly Ballard MRN: [id number] DOB: 05-Jun-1940   Cancelled treatment:       Reason Eval/Treat Not Completed: Medical issues which prohibited therapy. New BSE order received, however after chart review and discussion with MD, plan to hold off on BSE as patient is NPO with NG due to continued SBO. SLP to follow for patient readiness for PO's. Thank you for this consult!   Angela Nevin, MA, CCC-SLP Speech Therapy

## 2020-10-13 NOTE — Progress Notes (Signed)
RT NOTE:  Pt placed on HFNC (salter) at 15L. Pt is tolerating this well at this time with saturations of 94-96%. RT will continue to monitor.

## 2020-10-13 NOTE — Progress Notes (Signed)
NAME:  Kimberly Ballard, MRN:  [id number], DOB:  [birth year], LOS: 21 ADMISSION DATE:  09/22/2020, CONSULTATION DATE:  09/23/2020 REFERRING MD:  Adela Glimpse - TRH CHIEF COMPLAINT:  Dyspnea, cough   BRIEF  80 year old woman presented to [facility] ED 8/22 with dyspnea, body aches, cough after a significant COVID exposure. PMHx significant for AF (on Eliquis), COPD and former tobacco abuse.  Per patient, her daughter was diagnosed with COVID and patient developed symptoms ~8/16.  She discussed concerns with her PCP and paxlovid was prescribed 8/18.  At [facility], she was tested for COVID twice (both negative PCRs) and IgG antibody was also negative. Per PCP notes, patient did receive the primary series of the COVID vaccine as well as 2 subsequent boosters.    Pertinent Medical History:  COPD Atrial fibrillation Former cigarette smoker  Significant Hospital Events: Including procedures, antibiotic start and stop dates in addition to other pertinent events   8/22 Admit to SDU, weak cough, progressive resp failure. COVID-19 and influenza negative. Respiratory viral panel negative. IV ceftriaxone and azithromycin initiated. Given IV Solu-Medrol followed by oral prednisone order. 8/23 Pulmonary asked to evaluate given worsening respiratory failure.  CT chest showing lower lobe consolidation.  There was concern about possible aspiration.  Echo obtained showed good left ventricular function with mild pulmonary artery hypertension. RUQ ultrasound showed hepatic steatosis, possibly cirrhosis based on nodular liver contour, no gallbladder 8/24 Cardiology consulted for elevated troponins.  This was felt secondary to worsening hypoxia and underlying pulmonary artery hypertension which was felt previously undiagnosed issue.  New onset atrial fibrillation with RVR.  CT angiogram ordered.  Increased oxygen requirement overnight, requiring BiPAP, later placed on 15 L/min via high flow.  Lower extremity ultrasound negative for DVT 8/25  Urine strep neg.  Still high FIO2. F/u COVID neg. Transitioned to oral CCB and DOAC. Getting IV lasix. Transitioned to headed High flow [place] during day and BIPAP at night.  8/26 Still w/ overall net positive fluid balance over hospital stay. CXR w/ worse edema. Lasix increased to 80 mg q 12. Azithromycin stopped (day 5) Steroids reduced.  Cardiology signed off. (See note from cards w/ plan to follow as out-pt)  8/27 Diuresed, CXR better, eating, got up to chair 8/28 -9/1 Remains on FIO2 80% despite aggressive diuresis. Net neg 11L. 8/31 NO BUBBLE SHUNT on echo 9/02 Ongoing High FiO2 requirements. Net -13.7L/admission (-1.6L/24H). Tolerating HFNC/BiPAP and nebulization treatments. New hematuria for which Urology consulted, started CBI. 9/3 Slight improvement in FiO2 9/4 PCCM signed off 9/8 Remains on HHFNC 25L/45% FiO2, pursed-lip breathing. NGT in place for ileus.Reports that her breathing feels more difficult since earlier today. Pursed-lip breathing on exam. Remains on HHFNC 25LPM/45% FiO2. Sats 87-94% today. Belly feels bloated, passing some gas but minimal Bms. NGT remains in place - CCS managing. Mildly TTP over RLQ 9/9 -  35%  L HHFNC, 60% fio2 and worse. Ileus ongoing. On Neo since last night.  Last lopressor and cardizem last night. This morning a Fib RVR and amio gtt started. Covid IgG - still negative Palliative care consult. 9/10 not on the ventilator.  Is on FiO2 55% with 25 L flow rate on heated high flow nasal cannula.  Is on Cardizem drip, amiodarone drip and also Neo-Synephrine.  Heart rate of 111.- 125. AKi getting better. On Nacl at 2ml/h.  She says she used BiPAP last night.  She had mentioned abdominal pain.  She is on NG tube with suction.  Repeat COVID IgG negative 9/11  NPO, ~1.1L out of NGT. On low dose neo + amio. HFNC 55%/25L flow. AKI improved.   Interim History / Subjective:  Afebrile  Pt denies acute complaints  On HFNC 20L / 40%  Objective   Blood pressure 106/74, pulse  (!) 121, temperature (!) 97.3 F (36.3 C), temperature source Oral, resp. rate 14, height 5\' 3"  (1.6 m), weight 90.8 kg, SpO2 96 %.    FiO2 (%):  [50 %-55 %] 50 %   Intake/Output Summary (Last 24 hours) at 10/13/2020 0738 Last data filed at 10/13/2020 0600 Gross per 24 hour  Intake 3340.26 ml  Output 2195 ml  Net 1145.26 ml   Filed Weights   10/11/20 1000 10/12/20 0400 10/12/20 1000  Weight: 89.9 kg 87.2 kg 90.8 kg   Physical Examination: General: elderly female lying in bed in NAD HEENT: MM pink/moist, HHFNC in place, anicteric Neuro: AAOx4, speech clear, MAE  CV: s1s2 irr irr, AF 90-100's on monitor, no m/r/g PULM: non-labored at rest, lungs bilaterally diminished anterior, crackles posterior bases GI: soft, bsx4 hypoactive, NGT in place Extremities: warm/dry, no edema  Skin: no rashes or lesions   Resolved Hospital Problem List:    Assessment & Plan:   Acute Hypoxemic / Hypercarbic Respiratory Failure  Cor Pulmonale  Former Tobacco Abuse  Moderate Obstructive Lung Disease with Emphysema   Baseline moderate obstructive lung disease based on spirometry 2019.  New hypercarbia recognized this admission, compensated pCO2 56.2 on admit. Admit with bilateral infiltrates.  COVID negative, COVID IgG negative as well as PCR. Current symptoms not related to COVID. Cor Pulmonale identified on ECHO 8/23 with PA systolic of 48 and mild reduction in RV.  Negative shunt study. Consider BOOP/COP (previously on steroids during this admit), recurrent aspiration.  -pulmonary hygiene as able - IS, needs to mobilize  -continue BiPAP QHS  -continue xopenex nebulization  -wean O2 for sats 88-94% -follow intermittent CXR  -abx as below  -SLP evaluation to r/o aspiration  Severe Constipation, Ileus vs. SBO  Onset 9/6.   -per CCS  -NGT clamped 9/12 per CCS, NPO with ice chips -goal K>4, Mg> 2 -needs to mobilize  [facility] and Circulatory Shock S/p amio, neo, digoxin dosing.  Suspect element  of volume depletion with cor pulmonale, high NGT output.  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 No biopsy. No established diagnosis of portal hypertension. Would put her at risk of potential hepatopulmonary syndrome, although this is not well-established in this patient. -outpatient evaluation, per primary  AKI Suspect in setting of volume depletion, resolving -Trend BMP / urinary output -Replace electrolytes as indicated -Avoid nephrotoxic agents, ensure adequate renal perfusion  Sepsis Possible pulmonary source, treated on admit. PCT elevated, but likely in setting of AKI / false positive. No fever / clear source.  -continue cefepime per primary, planned stop date 9/16   Best Practice: (right click and "Reselect all SmartList Selections" daily)  Per Primary Team     Critical Care Time: 32 minutes     10/16, MSN, APRN, NP-C, AGACNP-BC [facility] 10/13/2020, 7:38 AM   Please see [email or web address] for pager details.   From 7A-7P if no response, please call [phone] After hours, please call ELink [phone]

## 2020-10-13 NOTE — Progress Notes (Signed)
Subjective: Patient has had some bowel movements, small volume and liquid, last BM this AM.  Continuing small bowel dilatation with minimal improvement on films yesterday. Pending Xray today. Denies worsening AB pain but complains of AB fullness.  Patient is getting dulcolax suppositories twice a day, miralax 17 grams twice a day.  She remains on pressors for hypotension, down to 20-30 mcg.  Palliative care has been consulted.    Objective: Vital signs in last 24 hours: Temp:  [97.3 F (36.3 C)-99.2 F (37.3 C)] 97.6 F (36.4 C) (09/12 0756) Pulse Rate:  [62-132] 118 (09/12 0900) Resp:  [14-40] 32 (09/12 0900) BP: (81-131)/(54-106) 105/77 (09/12 0930) SpO2:  [88 %-100 %] 92 % (09/12 0900) FiO2 (%):  [50 %] 50 % (09/12 0800) Weight change: 0.9 kg Last BM Date: 10/13/20  PE: Deconditioned, ill-appearing, on oxygen via nasal cannula, slightly tachypneic GENERAL: Able to speak a few words not full sentences  ABDOMEN: Distended, very with minimal bowel sounds, mild generalized tenderness EXTREMITIES: No deformity  Lab Results: Results for orders placed or performed during the hospital encounter of 09/22/20 (from the past 48 hour(s))  Glucose, capillary     Status: Abnormal   Collection Time: 10/11/20 12:00 PM  Result Value Ref Range   Glucose-Capillary 125 (H) 70 - 99 mg/dL    Comment: Glucose reference range applies only to samples taken after fasting for at least 8 hours.  Glucose, capillary     Status: Abnormal   Collection Time: 10/11/20  4:01 PM  Result Value Ref Range   Glucose-Capillary 117 (H) 70 - 99 mg/dL    Comment: Glucose reference range applies only to samples taken after fasting for at least 8 hours.  Glucose, capillary     Status: Abnormal   Collection Time: 10/11/20  9:22 PM  Result Value Ref Range   Glucose-Capillary 118 (H) 70 - 99 mg/dL    Comment: Glucose reference range applies only to samples taken after fasting for at least 8 hours.  Vancomycin, random      Status: None   Collection Time: 10/12/20  2:56 AM  Result Value Ref Range   Vancomycin Rm 10     Comment:        Random Vancomycin therapeutic range is dependent on dosage and time of specimen collection. A peak range is 20.0-40.0 ug/mL A trough range is 5.0-15.0 ug/mL        Performed at [facility], [address] [place], [place] [address]   Lactic acid, plasma     Status: None   Collection Time: 10/12/20  4:00 AM  Result Value Ref Range   Lactic Acid, Venous 1.1 0.5 - 1.9 mmol/L    Comment: Performed at [facility], [address] [place], [place] [address]  Glucose, capillary     Status: Abnormal   Collection Time: 10/12/20  8:17 AM  Result Value Ref Range   Glucose-Capillary 118 (H) 70 - 99 mg/dL    Comment: Glucose reference range applies only to samples taken after fasting for at least 8 hours.   Comment 1 Notify RN    Comment 2 Document in Chart   Cortisol, Random     Status: None   Collection Time: 10/12/20  9:30 AM  Result Value Ref Range   Cortisol, Plasma 38.3 ug/dL    Comment: (NOTE) AM    6.7 - 22.6 ug/dL PM   <10.6       ug/dL Performed at [facility], [address], [place], [place] [address]   CBC     Status: Abnormal   Collection Time: 10/12/20  9:35 AM  Result Value Ref Range   WBC 10.9 (H) 4.0 - 10.5 K/uL   RBC 4.66 3.87 - 5.11 MIL/uL   Hemoglobin 14.5 12.0 - 15.0 g/dL   HCT 28.3 15.1 - 76.1 %   MCV 92.1 80.0 - 100.0 fL   MCH 31.1 26.0 - 34.0 pg   MCHC 33.8 30.0 - 36.0 g/dL   RDW 60.7 37.1 - 06.2 %   Platelets 256 150 - 400 K/uL   nRBC 0.0 0.0 - 0.2 %    Comment: Performed at [facility], [address] [place], [place] [address]  Comprehensive metabolic panel     Status: Abnormal   Collection Time: 10/12/20  9:35 AM  Result Value Ref Range   Sodium 139 135 - 145 mmol/L   Potassium 3.0 (L) 3.5 - 5.1 mmol/L   Chloride 98 98 - 111 mmol/L   CO2 29 22 - 32 mmol/L    Glucose, Bld 112 (H) 70 - 99 mg/dL    Comment: Glucose reference range applies only to samples taken after fasting for at least 8 hours.   BUN 63 (H) 8 - 23 mg/dL   Creatinine, Ser 4.62 0.44 - 1.00 mg/dL   Calcium 8.1 (L) 8.9 - 10.3 mg/dL   Total Protein 5.6 (L) 6.5 - 8.1 g/dL   Albumin 2.3 (L) 3.5 - 5.0 g/dL   AST 49 (H) 15 - 41 U/L   ALT 92 (H) 0 - 44 U/L   Alkaline Phosphatase 65 38 - 126 U/L   Total Bilirubin 1.7 (H) 0.3 - 1.2 mg/dL   GFR, Estimated >70 >35 mL/min    Comment: (NOTE) Calculated using the CKD-EPI Creatinine Equation (2021)    Anion gap 12 5 - 15    Comment: Performed at [facility], [address], [place], [place] [address]  Glucose, capillary     Status: Abnormal   Collection Time: 10/12/20 12:03 PM  Result Value Ref Range   Glucose-Capillary 119 (H) 70 - 99 mg/dL    Comment: Glucose reference range applies only to samples taken after fasting for at least 8 hours.   Comment 1 Notify RN    Comment 2 Document in Chart   Blood gas, arterial     Status: Abnormal   Collection Time: 10/12/20 12:12 PM  Result Value Ref Range   FIO2 55%    Delivery systems HHFNC    pH, Arterial 7.491 (H) 7.350 - 7.450   pCO2 arterial 39.6 32.0 - 48.0 mmHg   pO2, Arterial 67.6 (L) 83.0 - 108.0 mmHg   Bicarbonate 29.9 (H) 20.0 - 28.0 mmol/L   Acid-Base Excess 6.5 (H) 0.0 - 2.0 mmol/L   O2 Saturation 92.9 %   Patient temperature 98.6    Collection site RIGHT RADIAL    Drawn by [id number]    Allens test (pass/fail) PASS PASS    Comment: Performed at [facility], [address] [place], [place] [address]  Glucose, capillary     Status: Abnormal   Collection Time: 10/12/20  3:58 PM  Result Value Ref Range   Glucose-Capillary 113 (H) 70 - 99 mg/dL    Comment: Glucose reference range applies only to samples taken after fasting for at least 8 hours.   Comment 1 Notify RN    Comment 2 Document in Chart  Glucose, capillary     Status: Abnormal    Collection Time: 10/12/20  9:31 PM  Result Value Ref Range   Glucose-Capillary 109 (H) 70 - 99 mg/dL    Comment: Glucose reference range applies only to samples taken after fasting for at least 8 hours.  CBC     Status: None   Collection Time: 10/13/20  2:45 AM  Result Value Ref Range   WBC 8.0 4.0 - 10.5 K/uL   RBC 4.30 3.87 - 5.11 MIL/uL   Hemoglobin 13.4 12.0 - 15.0 g/dL   HCT 60.439.9 54.036.0 - 98.146.0 %   MCV 92.8 80.0 - 100.0 fL   MCH 31.2 26.0 - 34.0 pg   MCHC 33.6 30.0 - 36.0 g/dL   RDW 19.114.2 47.811.5 - 29.515.5 %   Platelets 197 150 - 400 K/uL   nRBC 0.0 0.0 - 0.2 %    Comment: Performed at [facility], [address] [place], [place] [address]  Comprehensive metabolic panel     Status: Abnormal   Collection Time: 10/13/20  2:45 AM  Result Value Ref Range   Sodium 135 135 - 145 mmol/L   Potassium 3.4 (L) 3.5 - 5.1 mmol/L   Chloride 98 98 - 111 mmol/L   CO2 26 22 - 32 mmol/L   Glucose, Bld 120 (H) 70 - 99 mg/dL    Comment: Glucose reference range applies only to samples taken after fasting for at least 8 hours.   BUN 45 (H) 8 - 23 mg/dL   Creatinine, Ser 8.650.64 0.44 - 1.00 mg/dL   Calcium 7.8 (L) 8.9 - 10.3 mg/dL   Total Protein 5.3 (L) 6.5 - 8.1 g/dL   Albumin 2.1 (L) 3.5 - 5.0 g/dL   AST 45 (H) 15 - 41 U/L   ALT 81 (H) 0 - 44 U/L   Alkaline Phosphatase 58 38 - 126 U/L   Total Bilirubin 1.3 (H) 0.3 - 1.2 mg/dL   GFR, Estimated >78>60 >46>60 mL/min    Comment: (NOTE) Calculated using the CKD-EPI Creatinine Equation (2021)    Anion gap 11 5 - 15    Comment: Performed at [facility], [address] [place], [place] [address]  Magnesium     Status: Abnormal   Collection Time: 10/13/20  2:45 AM  Result Value Ref Range   Magnesium 2.5 (H) 1.7 - 2.4 mg/dL    Comment: Performed at [facility], [address] [place], [place] [address]  Glucose, capillary     Status: Abnormal   Collection Time: 10/13/20  7:48 AM  Result Value Ref  Range   Glucose-Capillary 124 (H) 70 - 99 mg/dL    Comment: Glucose reference range applies only to samples taken after fasting for at least 8 hours.   Comment 1 Notify RN    Comment 2 Document in Chart     Studies/Results: DG Abd Portable 1V  Result Date: 10/12/2020 CLINICAL DATA:  Follow-up small-bowel obstruction. EXAM: PORTABLE ABDOMEN - 1 VIEW COMPARISON:  10/10/2020 FINDINGS: Increased caliber of multiple dilated loops of small bowel in the mid and upper abdomen measuring up to 6.6 cm in diameter each. Persistent oral contrast in dilated small bowel loops in the pelvis. Those loops are significantly less dilated with some progression of contrast into the right colon. Unremarkable bones. IMPRESSION: Minimally improved high-grade partial small bowel obstruction. Electronically Signed   By: Beckie SaltsSteven  Reid M.D.   On: 10/12/2020 10:35    Medications: I  have reviewed the patient's current medications.  Assessment: Minimally improved high-grade partial small bowel obstruction Ileus  Multiple comorbidities-A. fib with RVR, on amiodarone, acute diastolic heart failure, hypoxic respiratory failure, hypertension on IV pressors Hypokalemia improving at 3.4 Improvement in renal function Cr 0.64 Slightly elevated AST, ALT of 49/92- trending down 45/81, possible congestive hepatopathy Resolved leukocytosis at 8.0 (10.9)  Plan: Continue n.p.o. status, NG tube to intermittent wall suction, daily abdominal x-ray for monitoring Continue Dulcolax suppository 10 mg twice a day Continue MiraLAX 17 g 3 times a day via tube, which is to be clamped for an hour after MiraLAX. Continue to maintain magnesium above 2 and potassium at 4-4.5.   Prognosis remains guarded, Palliative care consulted.  multiple dilated loops of small bowel measuring up to 6.6 cm with persistent oral contrast and dilated small bowel loops and pelvis-surgery on board   Doree Albee, PA-C 10/13/2020, 10:19 AM

## 2020-10-13 NOTE — Progress Notes (Addendum)
ANTICOAGULATION CONSULT NOTE - Initial Consult  Pharmacy Consult for heparin Indication: atrial fibrillation  No Known Allergies  Patient Measurements: Height: 5\' 3"  (160 cm) Weight: 90.9 kg (200 lb 6.4 oz) IBW/kg (Calculated) : 52.4 Heparin Dosing Weight: 84.6 kg  Vital Signs: Temp: 97.9 F (36.6 C) (09/12 1147) Temp Source: Oral (09/12 1147) BP: 92/55 (09/12 1345) Pulse Rate: 98 (09/12 1200)  Labs: Recent Labs    10/11/20 0742 10/12/20 0935 10/13/20 0245  HGB 14.7 14.5 13.4  HCT 43.0 42.9 39.9  PLT 267 256 197  CREATININE 1.69* 0.82 0.64  TROPONINIHS 104*  --   --     Estimated Creatinine Clearance: 61 mL/min (by C-G formula based on SCr of 0.64 mg/dL).   Medical History: Past Medical History:  Diagnosis Date   COPD (chronic obstructive pulmonary disease) (HCC)     Medications:  Scheduled:   bisacodyl  10 mg Rectal BID   budesonide (PULMICORT) nebulizer solution  0.5 mg Nebulization BID   chlorhexidine  15 mL Mouth Rinse BID   Chlorhexidine Gluconate Cloth  6 each Topical Daily   hydrocortisone sod succinate (SOLU-CORTEF) inj  100 mg Intravenous BID   insulin aspart  0-9 Units Subcutaneous TID WC   levalbuterol  1.25 mg Nebulization BID   liver oil-zinc oxide   Topical TID   mouth rinse  15 mL Mouth Rinse q12n4p   polyethylene glycol  17 g Per Tube TID   Infusions:   sodium chloride Stopped (09/29/20 1824)   sodium chloride Stopped (10/10/20 0309)   amiodarone 30 mg/hr (10/13/20 1300)   ceFEPime (MAXIPIME) IV 200 mL/hr at 10/13/20 1300   dextrose 5 % and 0.45% NaCl Stopped (10/13/20 1257)   heparin     phenylephrine (NEO-SYNEPHRINE) Adult infusion 20 mcg/min (10/13/20 1300)    Assessment: 80 yo female with pertinent PMH including atrial fibrillation found to have ileus.  Calculated CHADs-VASc = 3.  Patient on apixaban PTA however was held on 9/2 due to hematuria.  CCS following and says no emergent surgical intervention.  Patient is likely a poor  surgical candidate overall.   Cardiology recommends holding DOAC for now in patient does require surgical intervention of ileus.  Received heparin 5000 units subq 9/12 @ 0522.  Last dose apixaban 9/2.  Pharmacy consulted to dose IV heparin now that hematuria has resolved.    CBC stable  Goal of Therapy:  Heparin level 0.3-0.7 units/ml Monitor platelets by anticoagulation protocol: Yes   Plan:  Small heparin bolus of 2000 units given sqh admin this AM and hx hematuria Start heparin drip at 1250 units/hr F/u 8 hour HL  Monitor daily CBC, s/sx bleeding F/u ability to transition back to DOAC   11/2, PharmD 10/13/2020 2:42 PM

## 2020-10-13 NOTE — Progress Notes (Signed)
Progress Note  Patient Name: Kimberly Ballard Date of Encounter: 10/13/2020  [facility] Cardiologist: Armanda Magic, MD   Subjective   No acute overnight events. Patient has made little progress. She is still on HFNC but did not require BIPAP last night. She describes waxing and waning of her breathing. No chest pain or palpitations.   Inpatient Medications    Scheduled Meds:  bisacodyl  10 mg Rectal BID   budesonide (PULMICORT) nebulizer solution  0.5 mg Nebulization BID   chlorhexidine  15 mL Mouth Rinse BID   Chlorhexidine Gluconate Cloth  6 each Topical Daily   heparin injection (subcutaneous)  5,000 Units Subcutaneous Q8H   hydrocortisone sod succinate (SOLU-CORTEF) inj  100 mg Intravenous BID   insulin aspart  0-9 Units Subcutaneous TID WC   levalbuterol  1.25 mg Nebulization BID   liver oil-zinc oxide   Topical TID   mouth rinse  15 mL Mouth Rinse q12n4p   polyethylene glycol  17 g Per Tube TID   Continuous Infusions:  sodium chloride Stopped (09/29/20 1824)   sodium chloride Stopped (10/10/20 0309)   amiodarone 30 mg/hr (10/13/20 0224)   ceFEPime (MAXIPIME) IV Stopped (10/13/20 0020)   dextrose 5 % and 0.45% NaCl 10 mL/hr at 10/12/20 2220   phenylephrine (NEO-SYNEPHRINE) Adult infusion 20 mcg/min (10/13/20 1023)   PRN Meds: sodium chloride, ketorolac, oxybutynin, simethicone, sodium chloride   Vital Signs    Vitals:   10/13/20 0830 10/13/20 0900 10/13/20 0930 10/13/20 1147  BP: 110/72 106/68 105/77 (!) 97/56  Pulse:  (!) 118  (!) 117  Resp:  (!) 32  (!) 35  Temp:    97.9 F (36.6 C)  TempSrc:    Oral  SpO2:  92%  96%  Weight:      Height:        Intake/Output Summary (Last 24 hours) at 10/13/2020 1254 Last data filed at 10/13/2020 0600 Gross per 24 hour  Intake 2809.72 ml  Output 2195 ml  Net 614.72 ml   Last 3 Weights 10/12/2020 10/12/2020 10/11/2020  Weight (lbs) 200 lb 2.8 oz 192 lb 3.9 oz 198 lb 3.1 oz  Weight (kg) 90.8 kg 87.2 kg 89.9 kg       Telemetry    Atrial fibrillation with rates in the 90s to 110s. - Personally Reviewed  ECG    No new ECG tracing since 10/03/2020. - Personally Reviewed  Physical Exam   GEN: Ill appearing Caucasian female. No acute distress.   Neck: No JVD. Cardiac: Borderline tachycardic with irregularly irregular rhythm. No murmurs, rubs, or gallops. Radial pulses 2+ and equal bilaterally.  Respiratory: On HFNC. Mild crackles in bases. No wheezing noted. GI: Soft and mildly distended. Hypoactive bowel sounds.  MS: No to trace lower extremity edema bilaterally. No deformity. Skin: Warm and dry. Neuro:  No focal deficits. Psych: Normal affect. Responds appropriately.   Labs    High Sensitivity Troponin:   Recent Labs  Lab 09/24/20 0239 10/04/20 1721 10/04/20 1923 10/10/20 1225 10/11/20 0742  TROPONINIHS 52* 59* 67* 145* 104*      Chemistry Recent Labs  Lab 10/09/20 0251 10/10/20 0233 10/11/20 0742 10/12/20 0935 10/13/20 0245  NA 133*   < > 138 139 135  K 4.8   < > 3.5 3.0* 3.4*  CL 85*   < > 92* 98 98  CO2 32   < > 32 29 26  GLUCOSE 125*   < > 110* 112* 120*  BUN 82*   < >  105* 63* 45*  CREATININE 1.49*   < > 1.69* 0.82 0.64  CALCIUM 9.1   < > 8.2* 8.1* 7.8*  PROT 6.5  --   --  5.6* 5.3*  ALBUMIN 2.9*  --   --  2.3* 2.1*  AST 47*  --   --  49* 45*  ALT 95*  --   --  92* 81*  ALKPHOS 78  --   --  65 58  BILITOT 1.8*  --   --  1.7* 1.3*  GFRNONAA 36*   < > 31* >60 >60  ANIONGAP 16*   < > 14 12 11    < > = values in this interval not displayed.     Hematology Recent Labs  Lab 10/11/20 0742 10/12/20 0935 10/13/20 0245  WBC 14.8* 10.9* 8.0  RBC 4.68 4.66 4.30  HGB 14.7 14.5 13.4  HCT 43.0 42.9 39.9  MCV 91.9 92.1 92.8  MCH 31.4 31.1 31.2  MCHC 34.2 33.8 33.6  RDW 14.2 14.2 14.2  PLT 267 256 197    BNPNo results for input(s): BNP, PROBNP in the last 168 hours.   DDimer No results for input(s): DDIMER in the last 168 hours.   Radiology    DG Abd Portable  1V  Result Date: 10/13/2020 CLINICAL DATA:  Small-bowel obstruction EXAM: PORTABLE ABDOMEN - 1 VIEW COMPARISON:  10/12/2020 FINDINGS: Multiple gas-filled dilated loops of small bowel are again seen within the mid abdomen in keeping with a mid to distal small bowel obstruction. Contrast from prior CT examination is again seen within several decompressed loops of distal small bowel as well as throughout the nondistended colon. Nasogastric tube tip is seen overlying the a gastric bubble within the left upper quadrant the abdomen. Left flank excluded from view. No gross free intraperitoneal gas. IMPRESSION: Persistent findings of high-grade partial or developing complete mid to distal small bowel obstruction. Electronically Signed   By: Helyn NumbersAshesh  Parikh M.D.   On: 10/13/2020 11:37   DG Abd Portable 1V  Result Date: 10/12/2020 CLINICAL DATA:  Follow-up small-bowel obstruction. EXAM: PORTABLE ABDOMEN - 1 VIEW COMPARISON:  10/10/2020 FINDINGS: Increased caliber of multiple dilated loops of small bowel in the mid and upper abdomen measuring up to 6.6 cm in diameter each. Persistent oral contrast in dilated small bowel loops in the pelvis. Those loops are significantly less dilated with some progression of contrast into the right colon. Unremarkable bones. IMPRESSION: Minimally improved high-grade partial small bowel obstruction. Electronically Signed   By: Beckie SaltsSteven  Reid M.D.   On: 10/12/2020 10:35    Cardiac Studies   Complete Echo 09/23/2020: Impressions:  1. Left ventricular ejection fraction, by estimation, is 55 to 60%. Left  ventricular ejection fraction by 3D volume is 59 %. The left ventricle has  normal function. The left ventricle has no regional wall motion  abnormalities. There is moderate left  ventricular hypertrophy. Left ventricular diastolic parameters are  consistent with Grade I diastolic dysfunction (impaired relaxation).   2. Right ventricular systolic function mildly reduced at base and  mid,  with relative preserved apical systolic function. The right ventricular  size is moderately enlarged. There is moderately elevated pulmonary artery  systolic pressure. The estimated  right ventricular systolic pressure is 48.4 mmHg.   3. Left atrial size was mildly dilated.   4. Right atrial size was moderately dilated.   5. The mitral valve is normal in structure. Trivial mitral valve  regurgitation. No evidence of mitral stenosis.   6.  Tricuspid valve regurgitation is mild to moderate.   7. The aortic valve is normal in structure. Aortic valve regurgitation is  not visualized. No aortic stenosis is present.   8. The inferior vena cava is dilated in size with <50% respiratory  variability, suggesting right atrial pressure of 15 mmHg.  _______________   Limited Echo with Bubble Study 10/01/2020: Impressions: 1. Left ventricular ejection fraction, by estimation, is 60 to 65%. The  left ventricle has normal function. There is moderate concentric left  ventricular hypertrophy. Left ventricular diastolic function could not be  evaluated.   2. The mitral valve is grossly normal. Trivial mitral valve  regurgitation. No evidence of mitral stenosis.   3. The aortic valve is tricuspid. There is mild calcification of the  aortic valve. Aortic valve regurgitation is trivial. No aortic stenosis is  present.   4. Agitated saline contrast bubble study was negative, with no evidence  of any interatrial shunt.   Conclusion(s)/Recommendation(s): Limited study, no major change from  recent study 09/23/20. Bubble study negative for intraatrial shunt.   Patient Profile     80 y.o. female with a history of COPD with prior tobacco use (quit in 2019) who was admitted on 09/22/2020 for acute hypoxic respiratory failure secondary to COPD exacerbation and multifocal pneumonia after presenting with shortness of breath, body aches, cough, and decreased appetite. Negative for COVID this admission. Cardiology  consulted for elevated troponin. Did alter develop atrial fibrillation with RVR and was started on Cardizem and Eliquis with control of rates but not restoration of sinus rhythm Cardiology signed off on 09/26/2020 with plans to have patient follow-up in the [facility] Fib [facility] and likely DCCV after 304 weeks of anticoagulation. We were reconsulted on 10/01/2020 given recurrent RVR.  Assessment & Plan    New Onset Atrial Fibrillation with RVR - In the setting of acute hypoxic respiratory failure secondary to COPD exacerbation and pneumonia. Rates reasonably well controlled in the 90s to 110s today. - Potassium slightly low at 3.4 today. - Magnesium 2.5. - TSH normal. - Echo as below. - Initially on Cardizem and Lopressor but discontinued due to hypotension requiring pressors.  - Continue IV Amiodarone. Initially were trying to avoid this given COPD and fatty liver with possible cirrhosis but options were limited. - She received a few dose of IV Digoxin. Last dose yesterday. Will discuss whether additional dose of Digoxin is need with MD but rates reasonably well controlled at this time. - Initially started on Eliquis. However, this was held due to hematuria. Hemoglobin 13.4, down 16-18 range. Treated with CBI. Would restart IV Heparin when able. Would avoid DOAC at this time in case abdominal surgery is needed for ileus. - No plan for restoration of sinus rhythm at this time until there is substantial improvement in respiratory status. This can be discussed as an outpatient once patient recovers.   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  - Most recent chest x-ray on 10/04/2020 showed bilateral lower atelectasis or infiltrates slightly improved from prior study. - Echo showed LVEF of 59% but moderately enlarged RV with mildly reduced systolic function at the base and mid segments with relatively preserved apical systolic function. This can be seen  in acute PE  but chest CTA negative for this. - Initially diuresed with IV Lasix but this was stopped on 9/7. Net negative 7.6 L this admission. Weight trending back up. Dry weight around 190 lbs. - Does  not appear significant volume overloaded on exam. - Will hold on additional Lasix at this time given patient is still hypotensive on pressors. - RV dysfunction felt to be secondary to pulmonary hypertension. This is felt to be chronic but worsened in the setting of acute COPD exacerbation and pneumonia. She is still requiring pressors.   Hypotension/Shock - BP still low with systolic in the upper 80s at times.  - Currently on Phenylephrine 9mcg/min. - Palliative Care has been consulted to help clarify goals of care given little progress. Consider central access so that CVP and COOX can be monitored if aggressive care is going to persist.   Elevated Troponin - High-sensitivity troponin mildly elevated and flat. Initially peaked at 128 on 09/22/2020 and then downtrended. Increased again to 145 on 9/9 but again downtrending. - EKG showed T wave abnormality in anterior leads. No prior tracing for comparison. - Echo as above. - Troponin elevation not felt to be ACS and more consistent with demand. She does have coronary calcification on CT so may benefit from outpatient Lexiscan vs coronary CTA once she recovers from acute illness.    Hypokalemia - Potassium 3.4 today. Supplemented by primary team. - Continue to monitor.   AKI - Resolved - Creatinine peaked at 1.69 on 9/10 but has since returned to baseline. Likely due to hypotension. - Continue to monitor.   Otherwise, per primary team: - Acute hypoxic/hypercapnic respiratory failure requiring intermittent BiPAP  - Possible sepsis - COPD Exacerbation - Multifocal pneumonia  - Abdominal pain - Transamnitis  - Hyponatremia - Hematuria: Started on CBI. Urology consulted. - Ileus: S/p NGT placement. General Surgery consulted.  For  questions or updates, please contact [facility] Please consult [email or web address] for contact info under        Signed, Corrin Parker, PA-C  10/13/2020, 12:54 PM

## 2020-10-13 NOTE — Consult Note (Signed)
WOC Nurse wound follow up Wound type: persistent skin breakdown related to moisture, friction on buttocks Measurement:diffuse, consistent with MASD Wound ENI:DPOE, moist Drainage (amount, consistency, odor) scant serous Periwound:erythematous Dressing procedure/placement/frequency: Discussed with Bedside RN and we will discontinue [name]'s Butt Cream in favor of a thicker barrier, Desitin today. All other preventive measures are in place, unfortunately, patient continues to be in critical condition and without substantial progress, so skin failing to make progress is an expected finding.   WOC nursing team will not follow, but will remain available to this patient, the nursing and medical teams.  Please re-consult if needed. Thanks, Ladona Mow, MSN, RN, GNP, [place]  Pager# [phone]

## 2020-10-13 NOTE — Progress Notes (Signed)
PROGRESS NOTE    [name]  ALP:[id number] DOB: 1940/05/09 DOA: 09/22/2020 PCP: System, Provider Not In    Chief Complaint  Patient presents with   Shortness of Breath    Brief Narrative:  Kimberly Ballard is an 80 y.o. female with a history of COPD presented with shortness of breath.  hypoxic respiratory failure.  Report COVID exposure .CTA chest ruled out pulmonary embolism but showed bilateral infiltrates, hospital course complicated by multiple comorbidities listed as below, multiple subspecialty has been following her, she does not appear to make any progress, palliative care consulted as well for goals of care discussion  Subjective:  Says she will try to work with PT today  Assessment & Plan:   Active Problems:   CAP (community acquired pneumonia)   Acute respiratory failure with hypoxia and hypercapnia (HCC)   Septic shock (HCC)   Elevated LFTs   Cardiomegaly   Elevated troponin   Atrial fibrillation with RVR (HCC)   Acute and chronic respiratory failure (acute-on-chronic) (HCC)   Dyspnea   Hypoxemia   Ileus (HCC)   RVF (right ventricular failure) (HCC)   Shock circulatory (HCC)  Acute hypoxic/hypercapnic respiratory failure with history of COPD  -she initially required BiPAP , now on high flow nasal cannula -CTA chest ruled out pulmonary embolism, showed multifocal pneumonia - blood culture negative, COVID screening negative COVID serology negative, respiratory viral panel negative, autoimmune panel largely unremarkable -She was initially treated with Rocephin and Zithromax and steroid, repeat chest x-ray infiltrate no significant improvement, there is no wheezing on exam -Repeat procalcitonin on 9/9 was 2.6 which is elevated compared to before, she was started on Vanco and cefepime -Dr. Marchelle Gearing who recommend continue cefepime for 7 days can stop vancomycin  Severe constipation/ileus versus small bowel obstruction -GI/General surgery following, patient  remains on NG suction -No flatus, small BMs -per nursing lots of liquid stools-- will use rectal tube to avoid breakdown of skin  A. fib with RVR and hypotension/shock -She is currently on amiodarone drip and Neo-Synephrine drip, she is getting digoxin as needed per cardiology -Management per cardiology -hope she can come off the Neo-synephrine today  Diastolic CHF/pulmonary HTN Currently does not appear volume overloaded Cardiology following  Gross hematuria, required urology consult and CBI -Currently off CBI, does not appear to have recurrent hematuria  Yeast in the urine Treated with Diflucan  AKI on CKD 2 -Avoid hypotension, continue hydration, renal dosing meds -resolved  Right middle lobe nodule -Repeat CT chest in 3 months if appropriate   Nutritional Assessment: The patient's BMI is: Body mass index is 35.46 kg/m.Marland Kitchen Seen by dietician.  I agree with the assessment and plan as outlined below: Nutrition Status: Nutrition Problem: Inadequate oral intake Etiology: inability to eat Signs/Symptoms: NPO status Interventions: Refer to RD note for recommendations      DVT prophylaxis: heparin injection 5,000 Units Start: 10/09/20 1400   Code Status:DNR Family Communication: called daughter and updated her on her mother's many medical issues and guarded prognosis Disposition:   Status is: Inpatient   Dispo: The patient is from: Home              Anticipated d/c is to: To be determined              Anticipated d/c date is: TBD                Consultants:  Critical care GI General surgery Cardiology Urology Palliative care    Antimicrobials:  Anti-infectives (From admission, onward)    Start     Dose/Rate Route Frequency Ordered Stop   10/12/20 1200  ceFEPIme (MAXIPIME) 2 g in sodium chloride 0.9 % 100 mL IVPB        2 g 200 mL/hr over 30 Minutes Intravenous Every 12 hours 10/12/20 1133 10/17/20 1229   10/10/20 2000  ceFEPIme (MAXIPIME) 2 g in  sodium chloride 0.9 % 100 mL IVPB  Status:  Discontinued        2 g 200 mL/hr over 30 Minutes Intravenous Every 24 hours 10/10/20 1812 10/12/20 1133   10/10/20 1930  vancomycin (VANCOREADY) IVPB 1500 mg/300 mL        1,500 mg 150 mL/hr over 120 Minutes Intravenous  Once 10/10/20 1812 10/10/20 2045   10/10/20 1838  vancomycin variable dose per unstable renal function (pharmacist dosing)  Status:  Discontinued         Does not apply See admin instructions 10/10/20 1838 10/12/20 0834   10/09/20 1000  fluconazole (DIFLUCAN) tablet 200 mg        200 mg Per Tube Daily 10/09/20 0826 10/10/20 0903   10/08/20 1000  fluconazole (DIFLUCAN) tablet 200 mg  Status:  Discontinued        200 mg Oral Daily 10/07/20 1426 10/09/20 0826   10/03/20 1700  fluconazole (DIFLUCAN) tablet 200 mg  Status:  Discontinued        200 mg Oral Daily 10/03/20 1606 10/07/20 1426   09/23/20 1800  cefTRIAXone (ROCEPHIN) 2 g in sodium chloride 0.9 % 100 mL IVPB        2 g 200 mL/hr over 30 Minutes Intravenous Every 24 hours 09/22/20 1842 09/29/20 1749   09/23/20 1800  azithromycin (ZITHROMAX) 500 mg in sodium chloride 0.9 % 250 mL IVPB  Status:  Discontinued        500 mg 250 mL/hr over 60 Minutes Intravenous Every 24 hours 09/22/20 1842 09/25/20 1456   09/22/20 1630  cefTRIAXone (ROCEPHIN) 1 g in sodium chloride 0.9 % 100 mL IVPB        1 g 200 mL/hr over 30 Minutes Intravenous  Once 09/22/20 1618 09/22/20 1806   09/22/20 1630  azithromycin (ZITHROMAX) 500 mg in sodium chloride 0.9 % 250 mL IVPB        500 mg 250 mL/hr over 60 Minutes Intravenous  Once 09/22/20 1618 09/22/20 1939           Objective: Vitals:   10/13/20 0530 10/13/20 0600 10/13/20 0630 10/13/20 0756  BP: 119/69 113/75 106/74   Pulse: (!) 116 (!) 115 (!) 121   Resp: 18 14 14    Temp:      TempSrc:      SpO2: 98% 95% 96% 94%  Weight:      Height:        Intake/Output Summary (Last 24 hours) at 10/13/2020 12/13/2020 Last data filed at 10/13/2020  0600 Gross per 24 hour  Intake 3340.26 ml  Output 2195 ml  Net 1145.26 ml   Filed Weights   10/11/20 1000 10/12/20 0400 10/12/20 1000  Weight: 89.9 kg 87.2 kg 90.8 kg    Examination:   General: Appearance:    Obese female in no acute distress     Lungs:     Diminished, respirations unlabored  Heart:    Tachycardic. Irregularly irregular rhythm.    MS:   All extremities are intact.    Neurologic:   Awake, alert, hard of hearing  Data Reviewed: I have personally reviewed following labs and imaging studies  CBC: Recent Labs  Lab 10/09/20 0251 10/10/20 0233 10/11/20 0742 10/12/20 0935 10/13/20 0245  WBC 31.6* 21.7* 14.8* 10.9* 8.0  NEUTROABS  --   --  13.3*  --   --   HGB 17.9* 16.8* 14.7 14.5 13.4  HCT 52.0* 48.4* 43.0 42.9 39.9  MCV 92.9 92.2 91.9 92.1 92.8  PLT 329 329 267 256 197    Basic Metabolic Panel: Recent Labs  Lab 10/09/20 0251 10/10/20 0233 10/11/20 0742 10/12/20 0935 10/13/20 0245  NA 133* 133* 138 139 135  K 4.8 4.5 3.5 3.0* 3.4*  CL 85* 84* 92* 98 98  CO2 32 31 32 29 26  GLUCOSE 125* 135* 110* 112* 120*  BUN 82* 110* 105* 63* 45*  CREATININE 1.49* 2.47* 1.69* 0.82 0.64  CALCIUM 9.1 8.5* 8.2* 8.1* 7.8*  MG  --  3.3*  --   --  2.5*    GFR: Estimated Creatinine Clearance: 61 mL/min (by C-G formula based on SCr of 0.64 mg/dL).  Liver Function Tests: Recent Labs  Lab 10/08/20 0302 10/09/20 0251 10/12/20 0935 10/13/20 0245  AST 70* 47* 49* 45*  ALT 124* 95* 92* 81*  ALKPHOS 69 78 65 58  BILITOT 1.6* 1.8* 1.7* 1.3*  PROT 6.4* 6.5 5.6* 5.3*  ALBUMIN 3.0* 2.9* 2.3* 2.1*    CBG: Recent Labs  Lab 10/12/20 0817 10/12/20 1203 10/12/20 1558 10/12/20 2131 10/13/20 0748  GLUCAP 118* 119* 113* 109* 124*     No results found for this or any previous visit (from the past 240 hour(s)).       Radiology Studies: DG Abd Portable 1V  Result Date: 10/12/2020 CLINICAL DATA:  Follow-up small-bowel obstruction. EXAM: PORTABLE  ABDOMEN - 1 VIEW COMPARISON:  10/10/2020 FINDINGS: Increased caliber of multiple dilated loops of small bowel in the mid and upper abdomen measuring up to 6.6 cm in diameter each. Persistent oral contrast in dilated small bowel loops in the pelvis. Those loops are significantly less dilated with some progression of contrast into the right colon. Unremarkable bones. IMPRESSION: Minimally improved high-grade partial small bowel obstruction. Electronically Signed   By: Beckie Salts M.D.   On: 10/12/2020 10:35        Scheduled Meds:  bisacodyl  10 mg Rectal BID   budesonide (PULMICORT) nebulizer solution  0.5 mg Nebulization BID   chlorhexidine  15 mL Mouth Rinse BID   Chlorhexidine Gluconate Cloth  6 each Topical Daily   heparin injection (subcutaneous)  5,000 Units Subcutaneous Q8H   hydrocortisone sod succinate (SOLU-CORTEF) inj  100 mg Intravenous BID   insulin aspart  0-9 Units Subcutaneous TID WC   levalbuterol  1.25 mg Nebulization BID   liver oil-zinc oxide   Topical TID   mouth rinse  15 mL Mouth Rinse q12n4p   polyethylene glycol  17 g Per Tube TID   Continuous Infusions:  sodium chloride Stopped (09/29/20 1824)   sodium chloride Stopped (10/10/20 0309)   amiodarone 30 mg/hr (10/13/20 0224)   ceFEPime (MAXIPIME) IV Stopped (10/13/20 0020)   dextrose 5 % and 0.45% NaCl 10 mL/hr at 10/12/20 2220   phenylephrine (NEO-SYNEPHRINE) Adult infusion 30 mcg/min (10/13/20 0218)     LOS: 21 days    Joseph Art, DO [facility]  Available via Epic secure chat 7am-7pm for nonurgent issues Please page for urgent issues To page the attending provider between 7A-7P or the covering provider during after hours  7P-7A, please log into the web site [email or web address] and access using universal [place] password for that web site. If you do not have the password, please call the hospital operator.    10/13/2020, 8:23 AM

## 2020-10-13 NOTE — Progress Notes (Signed)
Pt still has NG tube in place and does not want to wear nocturnal bipap.  Pt remains on 13L [place] and is tolerating it well with o2 sats >92%.

## 2020-10-13 NOTE — Progress Notes (Signed)
AM K+ 3.4 with creat 0.64 and GFR > 60. CCM ELink electrolyte protocol initiated.

## 2020-10-13 NOTE — Progress Notes (Signed)
Progress Note     Subjective: Patient denies increased abdominal pain but reports upper abdomen feels full. She has had more BMs but still has dilated small bowel on film yesterday.   Objective: Vital signs in last 24 hours: Temp:  [97.3 F (36.3 C)-99.2 F (37.3 C)] 97.3 F (36.3 C) (09/12 0400) Pulse Rate:  [62-135] 121 (09/12 0630) Resp:  [14-40] 14 (09/12 0630) BP: (81-131)/(53-106) 106/74 (09/12 0630) SpO2:  [88 %-100 %] 94 % (09/12 0756) FiO2 (%):  [50 %] 50 % (09/12 0756) Weight:  [90.8 kg] 90.8 kg (09/11 1000) Last BM Date: 10/13/20  Intake/Output from previous day: 09/11 0701 - 09/12 0700 In: 3340.3 [I.V.:1610.1; IV Piggyback:1730.2] Out: 2195 [Urine:1270; Emesis/NG output:925] Intake/Output this shift: No intake/output data recorded.  PE: General: pleasant, WD, chronically ill appearing female who is laying in bed in NAD Heart: irregularly irregular with rate in 90s-100s Lungs: rales in bilateral bases. getting breathing treatment  Abd: soft, NT, less distention, BS high pitched but more active, NGT with bilious appearing drainage   Lab Results:  Recent Labs    10/12/20 0935 10/13/20 0245  WBC 10.9* 8.0  HGB 14.5 13.4  HCT 42.9 39.9  PLT 256 197   BMET Recent Labs    10/12/20 0935 10/13/20 0245  NA 139 135  K 3.0* 3.4*  CL 98 98  CO2 29 26  GLUCOSE 112* 120*  BUN 63* 45*  CREATININE 0.82 0.64  CALCIUM 8.1* 7.8*   PT/INR No results for input(s): LABPROT, INR in the last 72 hours. CMP     Component Value Date/Time   NA 135 10/13/2020 0245   K 3.4 (L) 10/13/2020 0245   CL 98 10/13/2020 0245   CO2 26 10/13/2020 0245   GLUCOSE 120 (H) 10/13/2020 0245   BUN 45 (H) 10/13/2020 0245   CREATININE 0.64 10/13/2020 0245   CALCIUM 7.8 (L) 10/13/2020 0245   PROT 5.3 (L) 10/13/2020 0245   ALBUMIN 2.1 (L) 10/13/2020 0245   AST 45 (H) 10/13/2020 0245   ALT 81 (H) 10/13/2020 0245   ALKPHOS 58 10/13/2020 0245   BILITOT 1.3 (H) 10/13/2020 0245    GFRNONAA >60 10/13/2020 0245   GFRAA  09/16/2009 1757    >60        The eGFR has been calculated using the MDRD equation. This calculation has not been validated in all clinical situations. eGFR's persistently <60 mL/min signify possible Chronic Kidney Disease.   Lipase     Component Value Date/Time   LIPASE 54 (H) 09/30/2020 0250       Studies/Results: DG Abd Portable 1V  Result Date: 10/12/2020 CLINICAL DATA:  Follow-up small-bowel obstruction. EXAM: PORTABLE ABDOMEN - 1 VIEW COMPARISON:  10/10/2020 FINDINGS: Increased caliber of multiple dilated loops of small bowel in the mid and upper abdomen measuring up to 6.6 cm in diameter each. Persistent oral contrast in dilated small bowel loops in the pelvis. Those loops are significantly less dilated with some progression of contrast into the right colon. Unremarkable bones. IMPRESSION: Minimally improved high-grade partial small bowel obstruction. Electronically Signed   By: Claudie Revering M.D.   On: 10/12/2020 10:35    Anti-infectives: Anti-infectives (From admission, onward)    Start     Dose/Rate Route Frequency Ordered Stop   10/12/20 1200  ceFEPIme (MAXIPIME) 2 g in sodium chloride 0.9 % 100 mL IVPB        2 g 200 mL/hr over 30 Minutes Intravenous Every 12 hours 10/12/20 1133  10/17/20 1229   10/10/20 2000  ceFEPIme (MAXIPIME) 2 g in sodium chloride 0.9 % 100 mL IVPB  Status:  Discontinued        2 g 200 mL/hr over 30 Minutes Intravenous Every 24 hours 10/10/20 1812 10/12/20 1133   10/10/20 1930  vancomycin (VANCOREADY) IVPB 1500 mg/300 mL        1,500 mg 150 mL/hr over 120 Minutes Intravenous  Once 10/10/20 1812 10/10/20 2045   10/10/20 1838  vancomycin variable dose per unstable renal function (pharmacist dosing)  Status:  Discontinued         Does not apply See admin instructions 10/10/20 1838 10/12/20 0834   10/09/20 1000  fluconazole (DIFLUCAN) tablet 200 mg        200 mg Per Tube Daily 10/09/20 0826 10/10/20 0903    10/08/20 1000  fluconazole (DIFLUCAN) tablet 200 mg  Status:  Discontinued        200 mg Oral Daily 10/07/20 1426 10/09/20 0826   10/03/20 1700  fluconazole (DIFLUCAN) tablet 200 mg  Status:  Discontinued        200 mg Oral Daily 10/03/20 1606 10/07/20 1426   09/23/20 1800  cefTRIAXone (ROCEPHIN) 2 g in sodium chloride 0.9 % 100 mL IVPB        2 g 200 mL/hr over 30 Minutes Intravenous Every 24 hours 09/22/20 1842 09/29/20 1749   09/23/20 1800  azithromycin (ZITHROMAX) 500 mg in sodium chloride 0.9 % 250 mL IVPB  Status:  Discontinued        500 mg 250 mL/hr over 60 Minutes Intravenous Every 24 hours 09/22/20 1842 09/25/20 1456   09/22/20 1630  cefTRIAXone (ROCEPHIN) 1 g in sodium chloride 0.9 % 100 mL IVPB        1 g 200 mL/hr over 30 Minutes Intravenous  Once 09/22/20 1618 09/22/20 1806   09/22/20 1630  azithromycin (ZITHROMAX) 500 mg in sodium chloride 0.9 % 250 mL IVPB        500 mg 250 mL/hr over 60 Minutes Intravenous  Once 09/22/20 1618 09/22/20 1939        Assessment/Plan Severe constipation/ileus  - NGT placed 9/6 - 925 cc out  - film yesterday with minimal improvement in PSBO secondary to large colonic stool burden  - patient clinically has a little less pain and abdomen feels a little less distended - no indication for emergent surgical intervention and patient is a poor surgical candidate  - recommend keeping K>4.0 and Mg > 2.0 to optimize bowel function - mobilize!! - GI following as well, will defer to their service on medical management of constipation and ileus. We will continue to be available if needed, but do not see any indication for acute surgical intervention at this time    FEN: ice chips and sips, IVF per TRH, NGT clamping as tolerated VTE: SCDs, SQH ID: [id number]/11>>   COPD with PNA AKI A. Fib with RVR UTI RML pulmonary nodule  LOS: 21 days    Norm Parcel, [facility] Surgery 10/13/2020, 8:07 AM Please see Amion for pager number  during day hours 7:00am-4:30pm

## 2020-10-14 ENCOUNTER — Inpatient Hospital Stay (HOSPITAL_COMMUNITY): Payer: Medicare HMO

## 2020-10-14 DIAGNOSIS — Z7189 Other specified counseling: Secondary | ICD-10-CM

## 2020-10-14 DIAGNOSIS — R0600 Dyspnea, unspecified: Secondary | ICD-10-CM | POA: Diagnosis not present

## 2020-10-14 DIAGNOSIS — Z515 Encounter for palliative care: Secondary | ICD-10-CM | POA: Diagnosis not present

## 2020-10-14 DIAGNOSIS — J9621 Acute and chronic respiratory failure with hypoxia: Secondary | ICD-10-CM | POA: Diagnosis not present

## 2020-10-14 DIAGNOSIS — J189 Pneumonia, unspecified organism: Secondary | ICD-10-CM | POA: Diagnosis not present

## 2020-10-14 DIAGNOSIS — J9620 Acute and chronic respiratory failure, unspecified whether with hypoxia or hypercapnia: Secondary | ICD-10-CM | POA: Diagnosis not present

## 2020-10-14 DIAGNOSIS — I517 Cardiomegaly: Secondary | ICD-10-CM | POA: Diagnosis not present

## 2020-10-14 DIAGNOSIS — R531 Weakness: Secondary | ICD-10-CM

## 2020-10-14 DIAGNOSIS — R109 Unspecified abdominal pain: Secondary | ICD-10-CM | POA: Diagnosis not present

## 2020-10-14 DIAGNOSIS — I4891 Unspecified atrial fibrillation: Secondary | ICD-10-CM | POA: Diagnosis not present

## 2020-10-14 LAB — COMPREHENSIVE METABOLIC PANEL
ALT: 80 U/L — ABNORMAL HIGH (ref 0–44)
AST: 48 U/L — ABNORMAL HIGH (ref 15–41)
Albumin: 2.2 g/dL — ABNORMAL LOW (ref 3.5–5.0)
Alkaline Phosphatase: 57 U/L (ref 38–126)
Anion gap: 9 (ref 5–15)
BUN: 32 mg/dL — ABNORMAL HIGH (ref 8–23)
CO2: 23 mmol/L (ref 22–32)
Calcium: 7.8 mg/dL — ABNORMAL LOW (ref 8.9–10.3)
Chloride: 99 mmol/L (ref 98–111)
Creatinine, Ser: 0.52 mg/dL (ref 0.44–1.00)
GFR, Estimated: >60 mL/min (ref >60)
Glucose, Bld: 120 mg/dL — ABNORMAL HIGH (ref 70–99)
Potassium: 3.9 mmol/L (ref 3.5–5.1)
Sodium: 131 mmol/L — ABNORMAL LOW (ref 135–145)
Total Bilirubin: 1.1 mg/dL (ref 0.3–1.2)
Total Protein: 5.4 g/dL — ABNORMAL LOW (ref 6.5–8.1)

## 2020-10-14 LAB — GLUCOSE, CAPILLARY
Glucose-Capillary: 128 mg/dL — ABNORMAL HIGH (ref 70–99)
Glucose-Capillary: 118 mg/dL — ABNORMAL HIGH (ref 70–99)
Glucose-Capillary: 124 mg/dL — ABNORMAL HIGH (ref 70–99)
Glucose-Capillary: 107 mg/dL — ABNORMAL HIGH (ref 70–99)

## 2020-10-14 LAB — HEPARIN LEVEL (UNFRACTIONATED)
Heparin Unfractionated: 0.65 IU/mL (ref 0.30–0.70)
Heparin Unfractionated: 0.9 IU/mL — ABNORMAL HIGH (ref 0.30–0.70)
Heparin Unfractionated: >1.1 IU/mL — ABNORMAL HIGH (ref 0.30–0.70)

## 2020-10-14 LAB — CBC
HCT: 39.7 % (ref 36.0–46.0)
Hemoglobin: 13.6 g/dL (ref 12.0–15.0)
MCH: 31.9 pg (ref 26.0–34.0)
MCHC: 34.3 g/dL (ref 30.0–36.0)
MCV: 93.2 fL (ref 80.0–100.0)
Platelets: 155 10*3/uL (ref 150–400)
RBC: 4.26 MIL/uL (ref 3.87–5.11)
RDW: 14.1 % (ref 11.5–15.5)
WBC: 6.9 10*3/uL (ref 4.0–10.5)
nRBC: 0 % (ref 0.0–0.2)

## 2020-10-14 MED ORDER — MIDODRINE HCL 5 MG PO TABS: 5.0000 mg | ORAL_TABLET | Freq: Three times a day (TID) | ORAL | Status: DC

## 2020-10-14 MED ORDER — DIPHENHYDRAMINE-ZINC ACETATE 2-0.1 % EX CREA
TOPICAL_CREAM | Freq: Two times a day (BID) | CUTANEOUS | Status: AC | PRN
  Administered 2020-10-14: 1 via TOPICAL
  Filled 2020-10-14: qty 28

## 2020-10-14 MED ORDER — HEPARIN (PORCINE) 25000 UT/250ML-% IV SOLN
900.0000 [IU]/h | INTRAVENOUS | Status: DC
  Administered 2020-10-14: 1050 [IU]/h via INTRAVENOUS
  Administered 2020-10-15 (×2): 900 [IU]/h via INTRAVENOUS
  Filled 2020-10-14 (×2): qty 250

## 2020-10-14 MED ORDER — IOHEXOL 9 MG/ML PO SOLN
ORAL | Status: AC
  Filled 2020-10-14: qty 1000

## 2020-10-14 MED ORDER — SODIUM CHLORIDE 0.9 % IV SOLN
2.0000 g | Freq: Three times a day (TID) | INTRAVENOUS | Status: AC
Start: 2020-10-14 — End: 2020-10-16
  Administered 2020-10-14 – 2020-10-16 (×9): 2 g via INTRAVENOUS
  Filled 2020-10-14 (×9): qty 2

## 2020-10-14 MED ORDER — ALBUMIN HUMAN 5 % IV SOLN
12.5000 g | Freq: Once | INTRAVENOUS | Status: AC
  Administered 2020-10-14: 12.5 g via INTRAVENOUS
  Filled 2020-10-14: qty 250

## 2020-10-14 MED ORDER — IOHEXOL 9 MG/ML PO SOLN
500.0000 mL | ORAL | Status: AC
Start: 2020-10-14 — End: 2020-10-14
  Administered 2020-10-14 (×2): 500 mL

## 2020-10-14 MED ORDER — IOHEXOL 350 MG/ML SOLN
80.0000 mL | Freq: Once | INTRAVENOUS | Status: AC | PRN
  Administered 2020-10-14: 80 mL via INTRAVENOUS

## 2020-10-14 MED ORDER — IOHEXOL 9 MG/ML PO SOLN: 500.0000 mL | ORAL | Status: DC

## 2020-10-14 NOTE — Progress Notes (Signed)
Progress Note  Patient Name: Kimberly Ballard Date of Encounter: 10/14/2020  [facility] Cardiologist: Armanda Magic, MD   Subjective   No acute overnight events. Slow progress but she is now off pressors and O2 is slowly being weaned (now on 6L [place]ter HFNC). She looks like she is breathing easier but denies any significant change in her breathing. No chest pain or palpitations. Rates reasonably well controlled in the low 100s to 110s. Going for repeat abdominal CT today to re-evaluate ileus.  Inpatient Medications    Scheduled Meds:  iohexol       bisacodyl  10 mg Rectal BID   budesonide (PULMICORT) nebulizer solution  0.5 mg Nebulization BID   chlorhexidine  15 mL Mouth Rinse BID   Chlorhexidine Gluconate Cloth  6 each Topical Daily   hydrocortisone sod succinate (SOLU-CORTEF) inj  100 mg Intravenous BID   insulin aspart  0-9 Units Subcutaneous TID WC   iohexol  500 mL Oral Q1H   levalbuterol  1.25 mg Nebulization BID   liver oil-zinc oxide   Topical TID   mouth rinse  15 mL Mouth Rinse q12n4p   polyethylene glycol  17 g Per Tube TID   Continuous Infusions:  sodium chloride Stopped (09/29/20 1824)   amiodarone 30 mg/hr (10/14/20 1129)   ceFEPime (MAXIPIME) IV Stopped (10/14/20 0905)   dextrose 5 % and 0.45% NaCl 10 mL/hr at 10/14/20 1129   heparin 900 Units/hr (10/14/20 1138)   PRN Meds: sodium chloride, diphenhydrAMINE-zinc acetate, oxybutynin, simethicone, sodium chloride   Vital Signs    Vitals:   10/14/20 1000 10/14/20 1041 10/14/20 1049 10/14/20 1100  BP:  (!) 87/63 (!) 100/57 (!) 94/56  Pulse: (!) 59 (!) 116 (!) 127 (!) 109  Resp: 17 (!) 21 (!) 26 (!) 26  Temp:      TempSrc:      SpO2: 96% 96% 97% 95%  Weight:      Height:        Intake/Output Summary (Last 24 hours) at 10/14/2020 1156 Last data filed at 10/14/2020 1129 Gross per 24 hour  Intake 1610.67 ml  Output 1250 ml  Net 360.67 ml   Last 3 Weights 10/13/2020 10/12/2020 10/12/2020  Weight (lbs) 200  lb 6.4 oz 200 lb 2.8 oz 192 lb 3.9 oz  Weight (kg) 90.9 kg 90.8 kg 87.2 kg      Telemetry    Atrial fibrillation with rates in the 100s to 110s. - Personally Reviewed  ECG    No new ECG tracing since 10/03/2020. - Personally Reviewed  Physical Exam   GEN: Ill appearing Caucasian female. No acute distress. NG tube in place. Neck: No JVD. Cardiac: Borderline tachycardic with irregularly irregular rhythm. No murmurs, rubs, or gallops. Radial pulses 2+ and equal bilaterally.  Respiratory: On HFNC. Minimal crackles in bases. No wheezing noted. GI: NG tube in place. Soft, distended, and mildly tender to palpation. Hypoactive bowel sounds.  MS: No to trace lower extremity edema bilaterally. No deformity. Skin: Warm and dry. Neuro:  No focal deficits. Psych: Normal affect. Responds appropriately.   Labs    High Sensitivity Troponin:   Recent Labs  Lab 09/24/20 0239 10/04/20 1721 10/04/20 1923 10/10/20 1225 10/11/20 0742  TROPONINIHS 52* 59* 67* 145* 104*      Chemistry Recent Labs  Lab 10/12/20 0935 10/13/20 0245 10/14/20 0250  NA 139 135 131*  K 3.0* 3.4* 3.9  CL 98 98 99  CO2 29 26 23   GLUCOSE 112*  120* 120*  BUN 63* 45* 32*  CREATININE 0.82 0.64 0.52  CALCIUM 8.1* 7.8* 7.8*  PROT 5.6* 5.3* 5.4*  ALBUMIN 2.3* 2.1* 2.2*  AST 49* 45* 48*  ALT 92* 81* 80*  ALKPHOS 65 58 57  BILITOT 1.7* 1.3* 1.1  GFRNONAA >60 >60 >60  ANIONGAP 12 11 9      Hematology Recent Labs  Lab 10/12/20 0935 10/13/20 0245 10/14/20 0250  WBC 10.9* 8.0 6.9  RBC 4.66 4.30 4.26  HGB 14.5 13.4 13.6  HCT 42.9 39.9 39.7  MCV 92.1 92.8 93.2  MCH 31.1 31.2 31.9  MCHC 33.8 33.6 34.3  RDW 14.2 14.2 14.1  PLT 256 197 155    BNPNo results for input(s): BNP, PROBNP in the last 168 hours.   DDimer No results for input(s): DDIMER in the last 168 hours.   Radiology    DG Abd Portable 1V  Result Date: 10/14/2020 CLINICAL DATA:  Ileus. EXAM: PORTABLE ABDOMEN - 1 VIEW COMPARISON:  October 13, 2020. FINDINGS: Stable small bowel dilatation is noted concerning for ileus or partial small bowel obstruction. Contrast is noted in the colon. Nasogastric tube tip is seen in stomach. IMPRESSION: Stable small bowel dilatation is noted concerning for ileus or partial small bowel obstruction. Electronically Signed   By: 11-09-1990 M.D.   On: 10/14/2020 10:12   DG Abd Portable 1V  Result Date: 10/13/2020 CLINICAL DATA:  Small-bowel obstruction EXAM: PORTABLE ABDOMEN - 1 VIEW COMPARISON:  10/12/2020 FINDINGS: Multiple gas-filled dilated loops of small bowel are again seen within the mid abdomen in keeping with a mid to distal small bowel obstruction. Contrast from prior CT examination is again seen within several decompressed loops of distal small bowel as well as throughout the nondistended colon. Nasogastric tube tip is seen overlying the a gastric bubble within the left upper quadrant the abdomen. Left flank excluded from view. No gross free intraperitoneal gas. IMPRESSION: Persistent findings of high-grade partial or developing complete mid to distal small bowel obstruction. Electronically Signed   By: 12/12/2020 M.D.   On: 10/13/2020 11:37    Cardiac Studies   Complete Echo 09/23/2020: Impressions:  1. Left ventricular ejection fraction, by estimation, is 55 to 60%. Left  ventricular ejection fraction by 3D volume is 59 %. The left ventricle has  normal function. The left ventricle has no regional wall motion  abnormalities. There is moderate left  ventricular hypertrophy. Left ventricular diastolic parameters are  consistent with Grade I diastolic dysfunction (impaired relaxation).   2. Right ventricular systolic function mildly reduced at base and mid,  with relative preserved apical systolic function. The right ventricular  size is moderately enlarged. There is moderately elevated pulmonary artery  systolic pressure. The estimated  right ventricular systolic pressure is 48.4  mmHg.   3. Left atrial size was mildly dilated.   4. Right atrial size was moderately dilated.   5. The mitral valve is normal in structure. Trivial mitral valve  regurgitation. No evidence of mitral stenosis.   6. Tricuspid valve regurgitation is mild to moderate.   7. The aortic valve is normal in structure. Aortic valve regurgitation is  not visualized. No aortic stenosis is present.   8. The inferior vena cava is dilated in size with <50% respiratory  variability, suggesting right atrial pressure of 15 mmHg.  _______________   Limited Echo with Bubble Study 10/01/2020: Impressions: 1. Left ventricular ejection fraction, by estimation, is 60 to 65%. The  left ventricle has  normal function. There is moderate concentric left  ventricular hypertrophy. Left ventricular diastolic function could not be  evaluated.   2. The mitral valve is grossly normal. Trivial mitral valve  regurgitation. No evidence of mitral stenosis.   3. The aortic valve is tricuspid. There is mild calcification of the  aortic valve. Aortic valve regurgitation is trivial. No aortic stenosis is  present.   4. Agitated saline contrast bubble study was negative, with no evidence  of any interatrial shunt.   Conclusion(s)/Recommendation(s): Limited study, no major change from  recent study 09/23/20. Bubble study negative for intraatrial shunt.   Patient Profile     80 y.o. female with a history of COPD with prior tobacco use (quit in 2019) who was admitted on 09/22/2020 for acute hypoxic respiratory failure secondary to COPD exacerbation and multifocal pneumonia after presenting with shortness of breath, body aches, cough, and decreased appetite. Negative for COVID this admission. Cardiology consulted for elevated troponin. Did alter develop atrial fibrillation with RVR and was started on Cardizem and Eliquis with control of rates but not restoration of sinus rhythm Cardiology signed off on 09/26/2020 with plans to have  patient follow-up in the [facility] and likely DCCV after 304 weeks of anticoagulation. We were reconsulted on 10/01/2020 given recurrent RVR.  Assessment & Plan    New Onset Atrial Fibrillation with RVR - In the setting of acute hypoxic respiratory failure secondary to COPD exacerbation and pneumonia. Rates reasonably well controlled in the 90s to 110s today. - Electrolytes OK. - TSH normal. - Echo as below. - Initially on Cardizem and Lopressor but discontinued due to hypotension requiring pressors.  - Continue IV Amiodarone. Initially were trying to avoid this given COPD and fatty liver with possible cirrhosis but options were limited. - She also received a few dose of IV Digoxin. Last dose yesterday.  - Initially started on Eliquis. However, this was held due to hematuria. Hemoglobin stable at 13.6. Treated with CBI. Restart IV Heparin on 9/12. Can switch to DOAC when clear she will not need any surgery for her ileus. - No plan for restoration of sinus rhythm at this time until there is substantial improvement in respiratory status. This can be discussed as an outpatient once patient recovers.   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  - Most recent chest x-ray on 10/04/2020 showed bilateral lower atelectasis or infiltrates slightly improved from prior study. - Echo showed LVEF of 59% but moderately enlarged RV with mildly reduced systolic function at the base and mid segments with relatively preserved apical systolic function. This can be seen in acute PE  but chest CTA negative for this. - Initially diuresed with IV Lasix but this was stopped on 9/7. Net negative -6.37 L this admission. Weight trending back up to 200 lbs. Dry weight around 190 lbs. - Does not appear significant volume overloaded on exam. - Will hold on additional Lasix at this time given persistently soft BP. - RV dysfunction felt to be secondary to pulmonary  hypertension. This is felt to be chronic but worsened in the setting of acute COPD exacerbation and pneumonia. She is still requiring pressors.    Hypotension/Shock - BP soft but stable. Systolic BP in the high 80s to 100 this morning. - Phenylephrine weaned off yesterday. - Palliative Care has been consulted to help clarify goals of care given little progress. Consider central access so that CVP and COOX can be  monitored if aggressive care is going to persist.   Elevated Troponin - High-sensitivity troponin mildly elevated and flat. Initially peaked at 128 on 09/22/2020 and then downtrended. Increased again to 145 on 9/9 but again downtrending. - EKG showed T wave abnormality in anterior leads. No prior tracing for comparison. - Echo as above. - Troponin elevation not felt to be ACS and more consistent with demand. She does have coronary calcification on CT so may benefit from outpatient Lexiscan vs coronary CTA once she recovers from acute illness.   AKI - Resolved - Creatinine peaked at 1.69 on 9/10 but has since returned to baseline. Likely due to hypotension. - Continue to monitor.    Otherwise, per primary team: - Acute hypoxic/hypercapnic respiratory failure requiring intermittent BiPAP  - Possible sepsis - COPD Exacerbation - Multifocal pneumonia  - Abdominal pain - Transamnitis  - Hyponatremia - Hematuria: Started on CBI. Urology consulted. - Ileus: S/p NGT placement. General Surgery consulted.  For questions or updates, please contact [facility] Please consult [email or web address] for contact info under        Signed, Corrin Parker, PA-C  10/14/2020, 11:56 AM

## 2020-10-14 NOTE — Progress Notes (Signed)
ANTICOAGULATION CONSULT NOTE   Pharmacy Consult for heparin Indication: atrial fibrillation  No Known Allergies  Patient Measurements: Height: 5\' 3"  (160 cm) Weight: 90.9 kg (200 lb 6.4 oz) IBW/kg (Calculated) : 52.4 Heparin Dosing Weight: 84.6 kg  Vital Signs: Temp: 97.4 F (36.3 C) (09/13 0800) Temp Source: Oral (09/13 0800) BP: 109/65 (09/13 0900) Pulse Rate: 92 (09/13 0900)  Labs: Recent Labs    10/12/20 0935 10/13/20 0245 10/13/20 1915 10/13/20 2346 10/14/20 0250 10/14/20 0933  HGB 14.5 13.4  --   --  13.6  --   HCT 42.9 39.9  --   --  39.7  --   PLT 256 197  --   --  155  --   HEPARINUNFRC  --   --  1.10* >1.10*  --  0.90*  CREATININE 0.82 0.64  --   --  0.52  --      Estimated Creatinine Clearance: 61 mL/min (by C-G formula based on SCr of 0.52 mg/dL).   Medical History: Past Medical History:  Diagnosis Date   COPD (chronic obstructive pulmonary disease) (HCC)     Medications:  Scheduled:   bisacodyl  10 mg Rectal BID   budesonide (PULMICORT) nebulizer solution  0.5 mg Nebulization BID   chlorhexidine  15 mL Mouth Rinse BID   Chlorhexidine Gluconate Cloth  6 each Topical Daily   hydrocortisone sod succinate (SOLU-CORTEF) inj  100 mg Intravenous BID   insulin aspart  0-9 Units Subcutaneous TID WC   levalbuterol  1.25 mg Nebulization BID   liver oil-zinc oxide   Topical TID   mouth rinse  15 mL Mouth Rinse q12n4p   polyethylene glycol  17 g Per Tube TID   Infusions:   sodium chloride Stopped (09/29/20 1824)   amiodarone 30 mg/hr (10/14/20 0916)   ceFEPime (MAXIPIME) IV Stopped (10/14/20 0905)   dextrose 5 % and 0.45% NaCl 10 mL/hr at 10/14/20 0916   heparin 1,050 Units/hr (10/14/20 0934)    Assessment: 80 yo female with pertinent PMH including atrial fibrillation found to have ileus.  Calculated CHADs-VASc = 3.  Patient on apixaban PTA however was held on 9/2 due to hematuria.  CCS following and says no emergent surgical intervention.  Patient  is likely a poor surgical candidate overall.   Cardiology recommends holding DOAC for now in patient does require surgical intervention of ileus.  Received heparin 5000 units subq 9/12 @ 0522.  Last dose apixaban 9/2.  Pharmacy consulted to dose IV heparin now that hematuria has resolved.    CBC stable  Goal of Therapy:  Heparin level 0.3-0.7 units/ml Monitor platelets by anticoagulation protocol: Yes   10/14/20 Heparin level @ 0930 resulted at 0.9 with heparin running at 1050 units/hr Discussed with RN - no signs of bleeding/hematuria  Plan:  Decrease heparin drip to 900 units/hr F/u 8 hour HL  Monitor daily CBC, s/sx bleeding F/u ability to transition back to DOAC  10/16/20, PharmD 10/14/2020 10:56 AM

## 2020-10-14 NOTE — Progress Notes (Signed)
ANTICOAGULATION CONSULT NOTE   Pharmacy Consult for heparin Indication: atrial fibrillation  No Known Allergies  Patient Measurements: Height: 5\' 3"  (160 cm) Weight: 90.9 kg (200 lb 6.4 oz) IBW/kg (Calculated) : 52.4 Heparin Dosing Weight: 84.6 kg  Vital Signs: Temp: 97.6 F (36.4 C) (09/12 2330) Temp Source: Oral (09/12 2330) BP: 98/54 (09/13 0000) Pulse Rate: 108 (09/13 0000)  Labs: Recent Labs    10/11/20 0742 10/12/20 0935 10/13/20 0245 10/13/20 1915 10/13/20 2346  HGB 14.7 14.5 13.4  --   --   HCT 43.0 42.9 39.9  --   --   PLT 267 256 197  --   --   HEPARINUNFRC  --   --   --  1.10* >1.10*  CREATININE 1.69* 0.82 0.64  --   --   TROPONINIHS 104*  --   --   --   --      Estimated Creatinine Clearance: 61 mL/min (by C-G formula based on SCr of 0.64 mg/dL).   Medical History: Past Medical History:  Diagnosis Date   COPD (chronic obstructive pulmonary disease) (HCC)     Medications:  Scheduled:   bisacodyl  10 mg Rectal BID   budesonide (PULMICORT) nebulizer solution  0.5 mg Nebulization BID   chlorhexidine  15 mL Mouth Rinse BID   Chlorhexidine Gluconate Cloth  6 each Topical Daily   hydrocortisone sod succinate (SOLU-CORTEF) inj  100 mg Intravenous BID   insulin aspart  0-9 Units Subcutaneous TID WC   levalbuterol  1.25 mg Nebulization BID   liver oil-zinc oxide   Topical TID   mouth rinse  15 mL Mouth Rinse q12n4p   polyethylene glycol  17 g Per Tube TID   Infusions:   sodium chloride Stopped (09/29/20 1824)   amiodarone 30 mg/hr (10/14/20 0014)   ceFEPime (MAXIPIME) IV Stopped (10/14/20 0008)   dextrose 5 % and 0.45% NaCl 10 mL/hr at 10/14/20 0014   heparin     phenylephrine (NEO-SYNEPHRINE) Adult infusion Stopped (10/13/20 2137)    Assessment: 80 yo female with pertinent PMH including atrial fibrillation found to have ileus.  Calculated CHADs-VASc = 3.  Patient on apixaban PTA however was held on 9/2 due to hematuria.  CCS following and says  no emergent surgical intervention.  Patient is likely a poor surgical candidate overall.   Cardiology recommends holding DOAC for now in patient does require surgical intervention of ileus.  Received heparin 5000 units subq 9/12 @ 0522.  Last dose apixaban 9/2.  Pharmacy consulted to dose IV heparin now that hematuria has resolved.    CBC stable  Goal of Therapy:  Heparin level 0.3-0.7 units/ml Monitor platelets by anticoagulation protocol: Yes   10/14/20 IV heparin bolus & gtt started @ 1526 on 9/13 with 8 hr heparin level ordered for 23:00.  Heparin level drawn @ 19:15 (?) = 1.1. Heparin level @ 23:46 > 1.1 with heparin infusing @ 1250 units/hr RN does report slight oozing from IV site  Plan:  Hold heparin gtt @ 1 hr then resume at lower rate of 1050 units/hr F/u 8 hour HL  Monitor daily CBC, s/sx bleeding F/u ability to transition back to DOAC   10/13, PharmD 10/14/2020 12:17 AM

## 2020-10-14 NOTE — Progress Notes (Signed)
PROGRESS NOTE    Kimberly Ballard  OXB:[id number] DOB: Jul 10, 1940 DOA: 09/22/2020 PCP: System, Provider Not In    Chief Complaint  Patient presents with   Shortness of Breath    Brief Narrative:  Kimberly Ballard is an 80 y.o. female with a history of COPD presented with shortness of breath.  hypoxic respiratory failure.  Reported COVID exposure but tested negative.  CTA chest ruled out pulmonary embolism but showed bilateral infiltrates, hospital course complicated by multiple issues including AKI, hematuria, a fib with RVR and ileus/obstruction. Multiple subspecialty have been following her, she does not appear to make any progress, palliative care consulted as well for goals of care discussion.  Plan for 9/13: repeat CT Scan.      Subjective:  Feels about the same  Assessment & Plan:   Active Problems:   CAP (community acquired pneumonia)   Acute respiratory failure with hypoxia and hypercapnia (HCC)   Septic shock (HCC)   Elevated LFTs   Cardiomegaly   Elevated troponin   Atrial fibrillation with RVR (HCC)   Acute and chronic respiratory failure (acute-on-chronic) (HCC)   Dyspnea   Hypoxemia   Ileus (HCC)   RVF (right ventricular failure) (HCC)   Shock circulatory (HCC)   Chest pain   Acute hypoxic/hypercapnic respiratory failure with history of COPD  -she initially required BiPAP , now on high flow nasal cannula -CTA chest ruled out pulmonary embolism, showed multifocal pneumonia - blood culture negative, COVID screening negative COVID serology negative, respiratory viral panel negative, autoimmune panel largely unremarkable -She was initially treated with Rocephin and Zithromax and steroid, repeat chest x-ray infiltrate no significant improvement, there is no wheezing on exam -Repeat procalcitonin on 9/9 was 2.6 which is elevated compared to before, she was started on Vanco and cefepime -Dr. Marchelle Gearing: continue cefepime for 7 days can stop vancomycin  Severe  constipation/ileus versus small bowel obstruction -GI/General surgery following, patient remains on NG suction -No flatus, small BMs -per nursing lots of liquid stools-- will use rectal tube to avoid breakdown of skin -repeat CT Scan on 9/13  A. fib with RVR and hypotension/shock -She is currently on amiodarone drip and Neo-Synephrine drip, she is getting digoxin as needed per cardiology -Management per cardiology -hope she can come off the Neo-synephrine  Diastolic CHF/pulmonary HTN Currently does not appear volume overloaded Cardiology following  Gross hematuria, required urology consult and CBI -Currently off CBI, does not appear to have recurrent hematuria  Yeast in the urine Treated with Diflucan  AKI on CKD 2 -Avoid hypotension, continue hydration, renal dosing meds -resolved  Right middle lobe nodule -Repeat CT chest in 3 months if appropriate   Nutritional Assessment: The patient's BMI is: Body mass index is 35.5 kg/m.Marland Kitchen Seen by dietician.  I agree with the assessment and plan as outlined below: Nutrition Status: Nutrition Problem: Inadequate oral intake Etiology: inability to eat Signs/Symptoms: NPO status Interventions: Refer to RD note for recommendations      DVT prophylaxis:    Code Status:DNR Family Communication: called daughter and updated her on her mother's many medical issues and guarded prognosis 9/12-- await CT scan results to call again Disposition:   Status is: Inpatient   Dispo: The patient is from: Home              Anticipated d/c is to: To be determined              Anticipated d/c date is: TBD  Consultants:  Critical care GI General surgery Cardiology Urology Palliative care    Antimicrobials:   Anti-infectives (From admission, onward)    Start     Dose/Rate Route Frequency Ordered Stop   10/14/20 0815  ceFEPIme (MAXIPIME) 2 g in sodium chloride 0.9 % 100 mL IVPB        2 g 200 mL/hr over 30 Minutes  Intravenous Every 8 hours 10/14/20 0718 10/16/20 2359   10/12/20 1200  ceFEPIme (MAXIPIME) 2 g in sodium chloride 0.9 % 100 mL IVPB  Status:  Discontinued        2 g 200 mL/hr over 30 Minutes Intravenous Every 12 hours 10/12/20 1133 10/14/20 0718   10/10/20 2000  ceFEPIme (MAXIPIME) 2 g in sodium chloride 0.9 % 100 mL IVPB  Status:  Discontinued        2 g 200 mL/hr over 30 Minutes Intravenous Every 24 hours 10/10/20 1812 10/12/20 1133   10/10/20 1930  vancomycin (VANCOREADY) IVPB 1500 mg/300 mL        1,500 mg 150 mL/hr over 120 Minutes Intravenous  Once 10/10/20 1812 10/10/20 2045   10/10/20 1838  vancomycin variable dose per unstable renal function (pharmacist dosing)  Status:  Discontinued         Does not apply See admin instructions 10/10/20 1838 10/12/20 0834   10/09/20 1000  fluconazole (DIFLUCAN) tablet 200 mg        200 mg Per Tube Daily 10/09/20 0826 10/10/20 0903   10/08/20 1000  fluconazole (DIFLUCAN) tablet 200 mg  Status:  Discontinued        200 mg Oral Daily 10/07/20 1426 10/09/20 0826   10/03/20 1700  fluconazole (DIFLUCAN) tablet 200 mg  Status:  Discontinued        200 mg Oral Daily 10/03/20 1606 10/07/20 1426   09/23/20 1800  cefTRIAXone (ROCEPHIN) 2 g in sodium chloride 0.9 % 100 mL IVPB        2 g 200 mL/hr over 30 Minutes Intravenous Every 24 hours 09/22/20 1842 09/29/20 1749   09/23/20 1800  azithromycin (ZITHROMAX) 500 mg in sodium chloride 0.9 % 250 mL IVPB  Status:  Discontinued        500 mg 250 mL/hr over 60 Minutes Intravenous Every 24 hours 09/22/20 1842 09/25/20 1456   09/22/20 1630  cefTRIAXone (ROCEPHIN) 1 g in sodium chloride 0.9 % 100 mL IVPB        1 g 200 mL/hr over 30 Minutes Intravenous  Once 09/22/20 1618 09/22/20 1806   09/22/20 1630  azithromycin (ZITHROMAX) 500 mg in sodium chloride 0.9 % 250 mL IVPB        500 mg 250 mL/hr over 60 Minutes Intravenous  Once 09/22/20 1618 09/22/20 1939           Objective: Vitals:   10/14/20 0400  10/14/20 0503 10/14/20 0600 10/14/20 0700  BP: 100/64 97/72 92/65  105/68  Pulse: (!) 103 63 (!) 120 (!) 125  Resp: (!) 25 14 13 14   Temp: 98.2 F (36.8 C)     TempSrc: Axillary     SpO2: 96% 98% 97% 98%  Weight:      Height:        Intake/Output Summary (Last 24 hours) at 10/14/2020 0751 Last data filed at 10/14/2020 0554 Gross per 24 hour  Intake 1261.15 ml  Output 1250 ml  Net 11.15 ml   Filed Weights   10/12/20 0400 10/12/20 1000 10/13/20 1000  Weight: 87.2 kg 90.8 kg 90.9  kg    Examination:  General: Appearance:    Obese female in no acute distress     Lungs:     respirations unlabored  Heart:    Tachycardic. irregular   MS:   All extremities are intact.    Neurologic:   Awake, alert, pleasant       Data Reviewed: I have personally reviewed following labs and imaging studies  CBC: Recent Labs  Lab 10/10/20 0233 10/11/20 0742 10/12/20 0935 10/13/20 0245 10/14/20 0250  WBC 21.7* 14.8* 10.9* 8.0 6.9  NEUTROABS  --  13.3*  --   --   --   HGB 16.8* 14.7 14.5 13.4 13.6  HCT 48.4* 43.0 42.9 39.9 39.7  MCV 92.2 91.9 92.1 92.8 93.2  PLT 329 267 256 197 155    Basic Metabolic Panel: Recent Labs  Lab 10/10/20 0233 10/11/20 0742 10/12/20 0935 10/13/20 0245 10/14/20 0250  NA 133* 138 139 135 131*  K 4.5 3.5 3.0* 3.4* 3.9  CL 84* 92* 98 98 99  CO2 31 32 29 26 23   GLUCOSE 135* 110* 112* 120* 120*  BUN 110* 105* 63* 45* 32*  CREATININE 2.47* 1.69* 0.82 0.64 0.52  CALCIUM 8.5* 8.2* 8.1* 7.8* 7.8*  MG 3.3*  --   --  2.5*  --     GFR: Estimated Creatinine Clearance: 61 mL/min (by C-G formula based on SCr of 0.52 mg/dL).  Liver Function Tests: Recent Labs  Lab 10/08/20 0302 10/09/20 0251 10/12/20 0935 10/13/20 0245 10/14/20 0250  AST 70* 47* 49* 45* 48*  ALT 124* 95* 92* 81* 80*  ALKPHOS 69 78 65 58 57  BILITOT 1.6* 1.8* 1.7* 1.3* 1.1  PROT 6.4* 6.5 5.6* 5.3* 5.4*  ALBUMIN 3.0* 2.9* 2.3* 2.1* 2.2*    CBG: Recent Labs  Lab 10/12/20 2131  10/13/20 0748 10/13/20 1152 10/13/20 1742 10/13/20 2136  GLUCAP 109* 124* 124* 127* 108*     No results found for this or any previous visit (from the past 240 hour(s)).       Radiology Studies: DG Abd Portable 1V  Result Date: 10/13/2020 CLINICAL DATA:  Small-bowel obstruction EXAM: PORTABLE ABDOMEN - 1 VIEW COMPARISON:  10/12/2020 FINDINGS: Multiple gas-filled dilated loops of small bowel are again seen within the mid abdomen in keeping with a mid to distal small bowel obstruction. Contrast from prior CT examination is again seen within several decompressed loops of distal small bowel as well as throughout the nondistended colon. Nasogastric tube tip is seen overlying the a gastric bubble within the left upper quadrant the abdomen. Left flank excluded from view. No gross free intraperitoneal gas. IMPRESSION: Persistent findings of high-grade partial or developing complete mid to distal small bowel obstruction. Electronically Signed   By: 12/12/2020 M.D.   On: 10/13/2020 11:37   DG Abd Portable 1V  Result Date: 10/12/2020 CLINICAL DATA:  Follow-up small-bowel obstruction. EXAM: PORTABLE ABDOMEN - 1 VIEW COMPARISON:  10/10/2020 FINDINGS: Increased caliber of multiple dilated loops of small bowel in the mid and upper abdomen measuring up to 6.6 cm in diameter each. Persistent oral contrast in dilated small bowel loops in the pelvis. Those loops are significantly less dilated with some progression of contrast into the right colon. Unremarkable bones. IMPRESSION: Minimally improved high-grade partial small bowel obstruction. Electronically Signed   By: 12/10/2020 M.D.   On: 10/12/2020 10:35        Scheduled Meds:  bisacodyl  10 mg Rectal BID   budesonide (PULMICORT)  nebulizer solution  0.5 mg Nebulization BID   chlorhexidine  15 mL Mouth Rinse BID   Chlorhexidine Gluconate Cloth  6 each Topical Daily   hydrocortisone sod succinate (SOLU-CORTEF) inj  100 mg Intravenous BID    insulin aspart  0-9 Units Subcutaneous TID WC   levalbuterol  1.25 mg Nebulization BID   liver oil-zinc oxide   Topical TID   mouth rinse  15 mL Mouth Rinse q12n4p   midodrine  5 mg Oral TID WC   polyethylene glycol  17 g Per Tube TID   Continuous Infusions:  sodium chloride Stopped (09/29/20 1824)   amiodarone 30 mg/hr (10/14/20 0554)   ceFEPime (MAXIPIME) IV     dextrose 5 % and 0.45% NaCl 10 mL/hr at 10/14/20 0554   heparin 1,050 Units/hr (10/14/20 0116)   phenylephrine (NEO-SYNEPHRINE) Adult infusion Stopped (10/13/20 2137)     LOS: 22 days    Joseph Art, DO [facility]  Available via Epic secure chat 7am-7pm for nonurgent issues Please page for urgent issues To page the attending provider between 7A-7P or the covering provider during after hours 7P-7A, please log into the web site [email or web address] and access using universal [place] password for that web site. If you do not have the password, please call the hospital operator.    10/14/2020, 7:51 AM

## 2020-10-14 NOTE — Progress Notes (Signed)
Pharmacy Antibiotic Note  Kimberly Ballard is a 80 y.o. female admitted on 09/22/2020 with sepsis.  Pharmacy has been consulted for cefepime dosing.  Plan: Increase Cefepime 2 gm to q8 hours with improved renal fx LOT 7 days per CCM- stop date entered for 9/15  Monitor clinical course, renal function, cultures as available   Height: 5\' 3"  (160 cm) Weight: 90.9 kg (200 lb 6.4 oz) IBW/kg (Calculated) : 52.4  Temp (24hrs), Avg:97.8 F (36.6 C), Min:97.4 F (36.3 C), Max:98.2 F (36.8 C)  Recent Labs  Lab 10/10/20 0233 10/10/20 1225 10/11/20 0742 10/12/20 0256 10/12/20 0400 10/12/20 0935 10/13/20 0245 10/14/20 0250  WBC 21.7*  --  14.8*  --   --  10.9* 8.0 6.9  CREATININE 2.47*  --  1.69*  --   --  0.82 0.64 0.52  LATICACIDVEN  --  1.5  --   --  1.1  --   --   --   VANCORANDOM  --   --   --  10  --   --   --   --      Estimated Creatinine Clearance: 61 mL/min (by C-G formula based on SCr of 0.52 mg/dL).    No Known Allergies  8/22 azithromycin >> 8/25 8/22 CTX >> 8/29 9/2 Fluconazole >> 9/9 9/9 vancomycin >> 9/11 9/9 cefepime >> (9/15)  8/22 BCx: ngf 8/31 UCx: cxt reincubated.  40k yeast  Thank you for allowing pharmacy to be a part of this patient's care.  9/31, PharmD 10/14/2020 7:20 AM

## 2020-10-14 NOTE — Progress Notes (Signed)
eLink Physician-Brief Progress Note Patient Name: Kimberly Ballard DOB: 1940-03-10 MRN: [id number]   Date of Service  10/14/2020  HPI/Events of Note  BP 82/53, MAP 57, Neo gtt recently discontinued. Serum albumin is 2.2 gm / dl.  eICU Interventions  Albumin 5 % 250 ml iv x 1 ordered.        Thomasene Lot Janney Priego 10/14/2020, 10:18 PM

## 2020-10-14 NOTE — Progress Notes (Signed)
eLink Physician-Brief Progress Note Patient Name: Kimberly Ballard DOB: [birth year] MRN: [id number]   Date of Service  10/14/2020  HPI/Events of Note  Patient with itchy rash over her back and her buttocks, no weal's , no hemodynamic compromise.  eICU Interventions  Benadryl cream ordered bid PRN.        Thomasene Lot Jamy Cleckler 10/14/2020, 4:35 AM

## 2020-10-14 NOTE — Evaluation (Signed)
SLP Cancellation Note  Patient Details Name: JUN RIGHTMYER MRN: [id number] DOB: [birth year]   Cancelled treatment:       Reason Eval/Treat Not Completed: Medical issues which prohibited therapy;Other (comment) (pt remains NPO)  [name], MS [facility] SLP Acute Rehab Services Office [phone] Pager [phone]  Chales Abrahams 10/14/2020, 11:55 AM

## 2020-10-14 NOTE — Progress Notes (Addendum)
NAME:  Kimberly Ballard, MRN:  [id number], DOB:  Jun 10, 1940, LOS: 22 ADMISSION DATE:  09/22/2020, CONSULTATION DATE:  09/23/2020 REFERRING MD:  Adela Glimpse - [facility] CHIEF COMPLAINT:  Dyspnea, cough   BRIEF  80 year old woman presented to [facility] ED 8/22 with dyspnea, body aches, cough after a significant COVID exposure. PMHx significant for AF (on Eliquis), COPD and former tobacco abuse.  Per patient, her daughter was diagnosed with COVID and patient developed symptoms ~8/16.  She discussed concerns with her PCP and paxlovid was prescribed 8/18.  At [facility], she was tested for COVID twice (both negative PCRs) and IgG antibody was also negative. Per PCP notes, patient did receive the primary series of the COVID vaccine as well as 2 subsequent boosters.    Pertinent Medical History:  COPD Atrial fibrillation Former cigarette smoker  Significant Hospital Events: Including procedures, antibiotic start and stop dates in addition to other pertinent events   8/22 Admit to SDU, weak cough, progressive resp failure. COVID-19 and influenza negative. Respiratory viral panel negative. IV ceftriaxone and azithromycin initiated. Given IV Solu-Medrol followed by oral prednisone order. 8/23 Pulmonary asked to evaluate given worsening respiratory failure.  CT chest showing lower lobe consolidation.  There was concern about possible aspiration.  Echo obtained showed good left ventricular function with mild pulmonary artery hypertension. RUQ ultrasound showed hepatic steatosis, possibly cirrhosis based on nodular liver contour, no gallbladder 8/24 Cardiology consulted for elevated troponins.  This was felt secondary to worsening hypoxia and underlying pulmonary artery hypertension which was felt previously undiagnosed issue.  New onset atrial fibrillation with RVR.  CT angiogram ordered.  Increased oxygen requirement overnight, requiring BiPAP, later placed on 15 L/min via high flow.  Lower extremity ultrasound negative for DVT 8/25  Urine strep neg.  Still high FIO2. F/u COVID neg. Transitioned to oral CCB and DOAC. Getting IV lasix. Transitioned to headed High flow [place] during day and BIPAP at night.  8/26 Still w/ overall net positive fluid balance over hospital stay. CXR w/ worse edema. Lasix increased to 80 mg q 12. Azithromycin stopped (day 5) Steroids reduced.  Cardiology signed off. (See note from cards w/ plan to follow as out-pt)  8/27 Diuresed, CXR better, eating, got up to chair 8/28 -9/1 Remains on FIO2 80% despite aggressive diuresis. Net neg 11L. 8/31 NO BUBBLE SHUNT on echo 9/02 Ongoing High FiO2 requirements. Net -13.7L/admission (-1.6L/24H). Tolerating HFNC/BiPAP and nebulization treatments. New hematuria for which Urology consulted, started CBI. 9/3 Slight improvement in FiO2 9/4 PCCM signed off 9/8 Remains on HHFNC 25L/45% FiO2, pursed-lip breathing. NGT in place for ileus.Reports that her breathing feels more difficult since earlier today. Pursed-lip breathing on exam. Remains on HHFNC 25LPM/45% FiO2. Sats 87-94% today. Belly feels bloated, passing some gas but minimal Bms. NGT remains in place - CCS managing. Mildly TTP over RLQ 9/9 -  35%  L HHFNC, 60% fio2 and worse. Ileus ongoing. On Neo since last night.  Last lopressor and cardizem last night. This morning a Fib RVR and amio gtt started. Covid IgG - still negative Palliative care consult. 9/10 not on the ventilator.  Is on FiO2 55% with 25 L flow rate on heated high flow nasal cannula.  Is on Cardizem drip, amiodarone drip and also Neo-Synephrine.  Heart rate of 111.- 125. AKi getting better. On Nacl at 50ml/h.  She says she used BiPAP last night.  She had mentioned abdominal pain.  She is on NG tube with suction.  Repeat COVID IgG negative 9/11  NPO, ~1.1L out of NGT. On low dose neo + amio. HFNC 55%/25L flow. AKI improved.  9/12 20L / 40% HFNC > transitioned to salter 15L 9/13 Weaned to 10L salter O2.  Off neosynephrine  Interim History / Subjective:   Rash noted overnight, given benadryl cream Seen by PT, recommended for SNF placement  Off neo since 2100 9/12 On 10L salter (was on HHFNC) Afebrile / WBC 6.9 I/O 800 ml UOP / 450 ml NGT GI PA reports liquid stool overnight  Objective   Blood pressure 105/68, pulse (!) 125, temperature 98.2 F (36.8 C), temperature source Axillary, resp. rate 14, height 5\' 3"  (1.6 m), weight 90.9 kg, SpO2 98 %.    FiO2 (%):  [50 %] 50 %   Intake/Output Summary (Last 24 hours) at 10/14/2020 10/16/2020 Last data filed at 10/14/2020 0554 Gross per 24 hour  Intake 1261.15 ml  Output 1250 ml  Net 11.15 ml   Filed Weights   10/12/20 0400 10/12/20 1000 10/13/20 1000  Weight: 87.2 kg 90.8 kg 90.9 kg   Physical Examination: General: elderly adult female lying in bed in NAD  HEENT: MM pink/moist, NGT in place, anicteric  Neuro: AAOx4, speech clear, MAE CV: s1s2 irr irr, rate 100's, no m/r/g PULM: non-labored at rest, O2 needs reduced, diminished breath sounds bilaterally, basilar crackles GI: soft, bsx4 active  Extremities: warm/dry, trace LE edema  Skin: no rashes or lesions  Resolved Hospital Problem List:  AKI Circulatory Shock   Assessment & Plan:   Acute Hypoxemic / Hypercarbic Respiratory Failure  Cor Pulmonale  Former Tobacco Abuse  Moderate Obstructive Lung Disease with Emphysema   Baseline moderate obstructive lung disease based on spirometry 2019.  New hypercarbia recognized this admission, compensated pCO2 56.2 on admit. Admit with bilateral infiltrates.  COVID negative, COVID IgG negative as well as PCR. Current symptoms not related to COVID. Cor Pulmonale identified on ECHO 8/23 with PA systolic of 48 and mild reduction in RV.  Negative shunt study. Consider BOOP/COP (previously on steroids during this admit), recurrent aspiration.   -wean O2 for sats 88-94% -pulmonary hygiene - IS, mobilize >> needs to get out of bed every shift, walk if able  -xopenex nebs given AF -follow CXR  -SLP  evaluation, r/o chronic aspiration  -BiPAP QHS   Severe Constipation, Ileus vs. SBO  Onset 9/6.   -per CCS -NGT clamped  -miralax TID -goal K>4, Mg>2  -needs to get out of bed  AFwRVR   S/p amio, neo, digoxin dosing.  Suspect element of volume depletion with cor pulmonale, high NGT output.  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 No biopsy. No established diagnosis of portal hypertension. Would put her at risk of potential hepatopulmonary syndrome, although this is not well-established in this patient. -will need outpatient evaluation, defer to primary   Sepsis Possible pulmonary source, treated on admit. PCT elevated, but likely in setting of AKI / false positive. No fever / clear source.  -cefepime per primary, planned stop date 9/16   Best Practice: (right click and "Reselect all SmartList Selections" daily)  Per Primary Team     Critical Care Time: n/a    PCCM will see again on 9/15.  Please call sooner if new needs arise.     10/15  Veleta Miners, MSN, APRN, NP-C, AGACNP-BC [facility] 10/14/2020, 7:22 AM   Please see [email or web address] for pager details.   From 7A-7P if no response, please call [phone] After hours, please call ELink [phone]

## 2020-10-14 NOTE — Progress Notes (Signed)
Pt still has NG tube in place and refuses nocturnal bipap at this time.  RT will continue to follow and assist as needed.

## 2020-10-14 NOTE — Progress Notes (Signed)
ANTICOAGULATION CONSULT NOTE   Pharmacy Consult for heparin Indication: atrial fibrillation  No Known Allergies  Patient Measurements: Height: 5\' 3"  (160 cm) Weight: 87.3 kg (192 lb 7.4 oz) IBW/kg (Calculated) : 52.4 Heparin Dosing Weight: 84.6 kg  Vital Signs: Temp: 97.6 F (36.4 C) (09/13 1600) Temp Source: Oral (09/13 1600) BP: 102/57 (09/13 1853) Pulse Rate: 83 (09/13 1853)  Labs: Recent Labs    10/12/20 0935 10/13/20 0245 10/13/20 1915 10/13/20 2346 10/14/20 0250 10/14/20 0933 10/14/20 1841  HGB 14.5 13.4  --   --  13.6  --   --   HCT 42.9 39.9  --   --  39.7  --   --   PLT 256 197  --   --  155  --   --   HEPARINUNFRC  --   --    < > >1.10*  --  0.90* 0.65  CREATININE 0.82 0.64  --   --  0.52  --   --    < > = values in this interval not displayed.   Estimated Creatinine Clearance: 59.8 mL/min (by C-G formula based on SCr of 0.52 mg/dL).  Medical History: Past Medical History:  Diagnosis Date   COPD (chronic obstructive pulmonary disease) (HCC)    Medications:  Scheduled:   bisacodyl  10 mg Rectal BID   budesonide (PULMICORT) nebulizer solution  0.5 mg Nebulization BID   chlorhexidine  15 mL Mouth Rinse BID   Chlorhexidine Gluconate Cloth  6 each Topical Daily   hydrocortisone sod succinate (SOLU-CORTEF) inj  100 mg Intravenous BID   insulin aspart  0-9 Units Subcutaneous TID WC   iohexol       levalbuterol  1.25 mg Nebulization BID   liver oil-zinc oxide   Topical TID   mouth rinse  15 mL Mouth Rinse q12n4p   polyethylene glycol  17 g Per Tube TID   Infusions:   sodium chloride Stopped (09/29/20 1824)   amiodarone 30 mg/hr (10/14/20 1850)   ceFEPime (MAXIPIME) IV Stopped (10/14/20 1444)   dextrose 5 % and 0.45% NaCl 10 mL/hr at 10/14/20 1839   heparin 900 Units/hr (10/14/20 1839)   Assessment: 80 yo female with pertinent PMH including atrial fibrillation found to have ileus.  Calculated CHADs-VASc = 3.  Patient on apixaban PTA however was held  on 9/2 due to hematuria.  CCS following and says no emergent surgical intervention.  Patient is likely a poor surgical candidate overall.   Cardiology recommends holding DOAC for now in patient does require surgical intervention of ileus.  Received heparin 5000 units subq 9/12 @ 0522.  Last dose apixaban 9/2.  Pharmacy consulted to dose IV heparin now that hematuria has resolved.    CBC stable  Goal of Therapy:  Heparin level 0.3-0.7 units/ml Monitor platelets by anticoagulation protocol: Yes   10/14/20 Heparin level @ 0930 resulted at 0.9 with heparin running at 1050 units/hr Discussed with RN - no signs of bleeding/hematuria 1841 Hep level 0.65 units/ml, in therapeutic range  Plan:  Continue Heparin drip at 900 units/hr Daily Heparin level Monitor daily CBC, s/sx bleeding F/u ability to transition back to DOAC  10/16/20 PharmD [facility] [phone] 10/14/2020, 8:15 PM

## 2020-10-14 NOTE — Progress Notes (Signed)
Subjective: Spoke with nurse, patient had 1 BM last night, watery, large volume.  States she is not passing gas at this time.  Patient is getting dulcolax suppositories twice a day, miralax 17 grams twice a day.  Persistent small bowel dilatation on films yesterday. Pending Xray today. Denies nausea, vomiting, AB pain.  Patient pressors discontinued yesterday, BP improving.  Palliative care has seen the patient.    Objective: Vital signs in last 24 hours: Temp:  [97.4 F (36.3 C)-98.2 F (36.8 C)] 97.4 F (36.3 C) (09/13 0800) Pulse Rate:  [63-134] 125 (09/13 0700) Resp:  [13-35] 14 (09/13 0700) BP: (87-121)/(50-87) 105/68 (09/13 0700) SpO2:  [91 %-98 %] 98 % (09/13 0700) Weight:  [90.9 kg] 90.9 kg (09/12 1000) Weight change: 0.1 kg Last BM Date: 10/13/20  PE: Deconditioned, ill-appearing, on oxygen via nasal cannula GENERAL: Able to speak a few words not full sentences  ABDOMEN: Distended, very with minimal bowel sounds, some high pitched bowel sounds upper AB, nontender AB EXTREMITIES: No deformity  Lab Results: Results for orders placed or performed during the hospital encounter of 09/22/20 (from the past 48 hour(s))  Cortisol, Random     Status: None   Collection Time: 10/12/20  9:30 AM  Result Value Ref Range   Cortisol, Plasma 38.3 ug/dL    Comment: (NOTE) AM    6.7 - 22.6 ug/dL PM   <51.0       ug/dL Performed at [facility], [address] [place], [place] [address]   CBC     Status: Abnormal   Collection Time: 10/12/20  9:35 AM  Result Value Ref Range   WBC 10.9 (H) 4.0 - 10.5 K/uL   RBC 4.66 3.87 - 5.11 MIL/uL   Hemoglobin 14.5 12.0 - 15.0 g/dL   HCT 77.8 24.2 - 35.3 %   MCV 92.1 80.0 - 100.0 fL   MCH 31.1 26.0 - 34.0 pg   MCHC 33.8 30.0 - 36.0 g/dL   RDW 61.4 43.1 - 54.0 %   Platelets 256 150 - 400 K/uL   nRBC 0.0 0.0 - 0.2 %    Comment: Performed at [facility], [address], [place], [place] [address]  Comprehensive  metabolic panel     Status: Abnormal   Collection Time: 10/12/20  9:35 AM  Result Value Ref Range   Sodium 139 135 - 145 mmol/L   Potassium 3.0 (L) 3.5 - 5.1 mmol/L   Chloride 98 98 - 111 mmol/L   CO2 29 22 - 32 mmol/L   Glucose, Bld 112 (H) 70 - 99 mg/dL    Comment: Glucose reference range applies only to samples taken after fasting for at least 8 hours.   BUN 63 (H) 8 - 23 mg/dL   Creatinine, Ser 1.95 0.44 - 1.00 mg/dL   Calcium 8.1 (L) 8.9 - 10.3 mg/dL   Total Protein 5.6 (L) 6.5 - 8.1 g/dL   Albumin 2.3 (L) 3.5 - 5.0 g/dL   AST 49 (H) 15 - 41 U/L   ALT 92 (H) 0 - 44 U/L   Alkaline Phosphatase 65 38 - 126 U/L   Total Bilirubin 1.7 (H) 0.3 - 1.2 mg/dL   GFR, Estimated >09 >32 mL/min    Comment: (NOTE) Calculated using the CKD-EPI Creatinine Equation (2021)    Anion gap 12 5 - 15    Comment: Performed at [facility], [address], [place], [place] [address]  Glucose, capillary  Status: Abnormal   Collection Time: 10/12/20 12:03 PM  Result Value Ref Range   Glucose-Capillary 119 (H) 70 - 99 mg/dL    Comment: Glucose reference range applies only to samples taken after fasting for at least 8 hours.   Comment 1 Notify RN    Comment 2 Document in Chart   Blood gas, arterial     Status: Abnormal   Collection Time: 10/12/20 12:12 PM  Result Value Ref Range   FIO2 55%    Delivery systems HHFNC    pH, Arterial 7.491 (H) 7.350 - 7.450   pCO2 arterial 39.6 32.0 - 48.0 mmHg   pO2, Arterial 67.6 (L) 83.0 - 108.0 mmHg   Bicarbonate 29.9 (H) 20.0 - 28.0 mmol/L   Acid-Base Excess 6.5 (H) 0.0 - 2.0 mmol/L   O2 Saturation 92.9 %   Patient temperature 98.6    Collection site RIGHT RADIAL    Drawn by [id number]    Allens test (pass/fail) PASS PASS    Comment: Performed at [facility], [address] [place], [place] [address]  Glucose, capillary     Status: Abnormal   Collection Time: 10/12/20  3:58 PM  Result Value Ref Range    Glucose-Capillary 113 (H) 70 - 99 mg/dL    Comment: Glucose reference range applies only to samples taken after fasting for at least 8 hours.   Comment 1 Notify RN    Comment 2 Document in Chart   Glucose, capillary     Status: Abnormal   Collection Time: 10/12/20  9:31 PM  Result Value Ref Range   Glucose-Capillary 109 (H) 70 - 99 mg/dL    Comment: Glucose reference range applies only to samples taken after fasting for at least 8 hours.  CBC     Status: None   Collection Time: 10/13/20  2:45 AM  Result Value Ref Range   WBC 8.0 4.0 - 10.5 K/uL   RBC 4.30 3.87 - 5.11 MIL/uL   Hemoglobin 13.4 12.0 - 15.0 g/dL   HCT 51.7 61.6 - 07.3 %   MCV 92.8 80.0 - 100.0 fL   MCH 31.2 26.0 - 34.0 pg   MCHC 33.6 30.0 - 36.0 g/dL   RDW 71.0 62.6 - 94.8 %   Platelets 197 150 - 400 K/uL   nRBC 0.0 0.0 - 0.2 %    Comment: Performed at [facility], [address], [place], [place] [address]  Comprehensive metabolic panel     Status: Abnormal   Collection Time: 10/13/20  2:45 AM  Result Value Ref Range   Sodium 135 135 - 145 mmol/L   Potassium 3.4 (L) 3.5 - 5.1 mmol/L   Chloride 98 98 - 111 mmol/L   CO2 26 22 - 32 mmol/L   Glucose, Bld 120 (H) 70 - 99 mg/dL    Comment: Glucose reference range applies only to samples taken after fasting for at least 8 hours.   BUN 45 (H) 8 - 23 mg/dL   Creatinine, Ser 0.35 0.44 - 1.00 mg/dL   Calcium 7.8 (L) 8.9 - 10.3 mg/dL   Total Protein 5.3 (L) 6.5 - 8.1 g/dL   Albumin 2.1 (L) 3.5 - 5.0 g/dL   AST 45 (H) 15 - 41 U/L   ALT 81 (H) 0 - 44 U/L   Alkaline Phosphatase 58 38 - 126 U/L   Total Bilirubin 1.3 (H) 0.3 - 1.2 mg/dL   GFR, Estimated >00 >93 mL/min    Comment: (NOTE)  Calculated using the CKD-EPI Creatinine Equation (2021)    Anion gap 11 5 - 15    Comment: Performed at [facility], [address], [place], [place] [address]  Magnesium     Status: Abnormal   Collection Time: 10/13/20  2:45 AM  Result Value Ref  Range   Magnesium 2.5 (H) 1.7 - 2.4 mg/dL    Comment: Performed at [facility], [address], [place], [place] [address]  Glucose, capillary     Status: Abnormal   Collection Time: 10/13/20  7:48 AM  Result Value Ref Range   Glucose-Capillary 124 (H) 70 - 99 mg/dL    Comment: Glucose reference range applies only to samples taken after fasting for at least 8 hours.   Comment 1 Notify RN    Comment 2 Document in Chart   Glucose, capillary     Status: Abnormal   Collection Time: 10/13/20 11:52 AM  Result Value Ref Range   Glucose-Capillary 124 (H) 70 - 99 mg/dL    Comment: Glucose reference range applies only to samples taken after fasting for at least 8 hours.   Comment 1 Notify RN    Comment 2 Document in Chart   Glucose, capillary     Status: Abnormal   Collection Time: 10/13/20  5:42 PM  Result Value Ref Range   Glucose-Capillary 127 (H) 70 - 99 mg/dL    Comment: Glucose reference range applies only to samples taken after fasting for at least 8 hours.   Comment 1 Notify RN    Comment 2 Document in Chart   Heparin level (unfractionated)     Status: Abnormal   Collection Time: 10/13/20  7:15 PM  Result Value Ref Range   Heparin Unfractionated 1.10 (H) 0.30 - 0.70 IU/mL    Comment: (NOTE) The clinical reportable range upper limit is being lowered to >1.10 to align with the FDA approved guidance for the current laboratory assay.  If heparin results are below expected values, and patient dosage has  been confirmed, suggest follow up testing of antithrombin III levels. Performed at [facility], [address], [place], [place] [address]   Glucose, capillary     Status: Abnormal   Collection Time: 10/13/20  9:36 PM  Result Value Ref Range   Glucose-Capillary 108 (H) 70 - 99 mg/dL    Comment: Glucose reference range applies only to samples taken after fasting for at least 8 hours.  Heparin level (unfractionated)     Status: Abnormal    Collection Time: 10/13/20 11:46 PM  Result Value Ref Range   Heparin Unfractionated >1.10 (H) 0.30 - 0.70 IU/mL    Comment: (NOTE) The clinical reportable range upper limit is being lowered to >1.10 to align with the FDA approved guidance for the current laboratory assay.  If heparin results are below expected values, and patient dosage has  been confirmed, suggest follow up testing of antithrombin III levels. Performed at [facility], [address] [place], [place] [address]   Comprehensive metabolic panel     Status: Abnormal   Collection Time: 10/14/20  2:50 AM  Result Value Ref Range   Sodium 131 (L) 135 - 145 mmol/L   Potassium 3.9 3.5 - 5.1 mmol/L   Chloride 99 98 - 111 mmol/L   CO2 23 22 - 32 mmol/L   Glucose, Bld 120 (H) 70 - 99 mg/dL    Comment: Glucose reference range applies only to samples taken after  fasting for at least 8 hours.   BUN 32 (H) 8 - 23 mg/dL   Creatinine, Ser 9.93 0.44 - 1.00 mg/dL   Calcium 7.8 (L) 8.9 - 10.3 mg/dL   Total Protein 5.4 (L) 6.5 - 8.1 g/dL   Albumin 2.2 (L) 3.5 - 5.0 g/dL   AST 48 (H) 15 - 41 U/L   ALT 80 (H) 0 - 44 U/L   Alkaline Phosphatase 57 38 - 126 U/L   Total Bilirubin 1.1 0.3 - 1.2 mg/dL   GFR, Estimated >71 >69 mL/min    Comment: (NOTE) Calculated using the CKD-EPI Creatinine Equation (2021)    Anion gap 9 5 - 15    Comment: Performed at [facility], [address], [place], [place] [address]  CBC     Status: None   Collection Time: 10/14/20  2:50 AM  Result Value Ref Range   WBC 6.9 4.0 - 10.5 K/uL   RBC 4.26 3.87 - 5.11 MIL/uL   Hemoglobin 13.6 12.0 - 15.0 g/dL   HCT 81.0 17.5 - 10.2 %   MCV 93.2 80.0 - 100.0 fL   MCH 31.9 26.0 - 34.0 pg   MCHC 34.3 30.0 - 36.0 g/dL   RDW 58.5 27.7 - 82.4 %   Platelets 155 150 - 400 K/uL   nRBC 0.0 0.0 - 0.2 %    Comment: Performed at [facility], [address], [place], [place] [address]  Glucose, capillary      Status: Abnormal   Collection Time: 10/14/20  7:58 AM  Result Value Ref Range   Glucose-Capillary 128 (H) 70 - 99 mg/dL    Comment: Glucose reference range applies only to samples taken after fasting for at least 8 hours.   Comment 1 Notify RN    Comment 2 Document in Chart     Studies/Results: DG Abd Portable 1V  Result Date: 10/13/2020 CLINICAL DATA:  Small-bowel obstruction EXAM: PORTABLE ABDOMEN - 1 VIEW COMPARISON:  10/12/2020 FINDINGS: Multiple gas-filled dilated loops of small bowel are again seen within the mid abdomen in keeping with a mid to distal small bowel obstruction. Contrast from prior CT examination is again seen within several decompressed loops of distal small bowel as well as throughout the nondistended colon. Nasogastric tube tip is seen overlying the a gastric bubble within the left upper quadrant the abdomen. Left flank excluded from view. No gross free intraperitoneal gas. IMPRESSION: Persistent findings of high-grade partial or developing complete mid to distal small bowel obstruction. Electronically Signed   By: Helyn Numbers M.D.   On: 10/13/2020 11:37    Medications: I have reviewed the patient's current medications.  Assessment: Continuous high-grade partial small bowel obstruction Ileus- had BM last night but states not passing gas today  Multiple comorbidities-A. fib with RVR, on amiodarone, acute diastolic heart failure, hypoxic respiratory failure, hypertension on IV pressors Hypokalemia improving at 3.9 Improvement in renal function Cr 0.64 Slightly elevated AST, ALT 48/80- possible congestive hepatopathy Resolved leukocytosis  Plan: Continue n.p.o. status, NG tube to intermittent wall suction, daily abdominal x-ray for monitoring- pending today's Xray Continue Dulcolax suppository 10 mg twice a day Continue MiraLAX 17 g 3 times a day via tube, which is to be clamped for an hour after MiraLAX. Continue to maintain magnesium above 2 and potassium at  4-4.5.   Prognosis remains guarded, Palliative care consulted.  multiple dilated loops of small bowel measuring up to 6.6 cm with persistent oral contrast and dilated  small bowel loops and pelvis-surgery on board   Doree Albee, PA-C 10/14/2020, 8:22 AM

## 2020-10-14 NOTE — Progress Notes (Signed)
Daily Progress Note   Patient Name: Kimberly Ballard       Date: 10/14/2020 DOB: 12/01/1940  Age: 80 y.o. MRN#: [id number] Attending Physician: Joseph Art, DO Primary Care Physician: System, Provider Not In Admit Date: 09/22/2020  Reason for Consultation/Follow-up: Establishing goals of care  Subjective: [name] is lying in bed in no distress awaiting CT today. Daughter not currently at bedside, chart reviewed, discussed with Dr Benjamine Mola from [facility].     Length of Stay: 22  Current Medications: Scheduled Meds:   bisacodyl  10 mg Rectal BID   budesonide (PULMICORT) nebulizer solution  0.5 mg Nebulization BID   chlorhexidine  15 mL Mouth Rinse BID   Chlorhexidine Gluconate Cloth  6 each Topical Daily   hydrocortisone sod succinate (SOLU-CORTEF) inj  100 mg Intravenous BID   insulin aspart  0-9 Units Subcutaneous TID WC   iohexol       levalbuterol  1.25 mg Nebulization BID   liver oil-zinc oxide   Topical TID   mouth rinse  15 mL Mouth Rinse q12n4p   polyethylene glycol  17 g Per Tube TID    Continuous Infusions:  sodium chloride Stopped (09/29/20 1824)   amiodarone 30 mg/hr (10/14/20 1246)   ceFEPime (MAXIPIME) IV 2 g (10/14/20 1414)   dextrose 5 % and 0.45% NaCl 10 mL/hr at 10/14/20 1246   heparin 900 Units/hr (10/14/20 1246)    PRN Meds: sodium chloride, diphenhydrAMINE-zinc acetate, oxybutynin, simethicone, sodium chloride  Physical Exam         General: Alert, awake,   NGT in place. Heart: Tachycardic.   Lungs: Decreased air movement,   Abdomen: Soft, nontender, nondistended, positive bowel sounds.   Ext: +edema Skin: Warm and dry Neuro: Grossly intact, nonfocal.  Vital Signs: BP 107/62   Pulse (!) 125   Temp (!) 97.4 F (36.3 C) (Oral)   Resp (!) 25   Ht 5\' 3"   (1.6 m)   Wt 87.3 kg   SpO2 95%   BMI 34.09 kg/m  SpO2: SpO2: 95 % O2 Device: O2 Device: High Flow Nasal Cannula (salter) O2 Flow Rate: O2 Flow Rate (L/min): 6 L/min  Intake/output summary:  Intake/Output Summary (Last 24 hours) at 10/14/2020 1449 Last data filed at 10/14/2020 1246 Gross per 24 hour  Intake 1298.77 ml  Output  1050 ml  Net 248.77 ml    LBM: Last BM Date: 10/14/20 Baseline Weight: Weight: 90.9 kg Most recent weight: Weight: 87.3 kg       Palliative Assessment/Data:    Flowsheet Rows    Flowsheet Row Most Recent Value  Intake Tab   Referral Department Hospitalist  Unit at Time of Referral ICU  Palliative Care Primary Diagnosis Sepsis/Infectious Disease  Date Notified 10/09/20  Palliative Care Type New Palliative care  Reason for referral Clarify Goals of Care  Date of Admission 09/22/20  Date first seen by Palliative Care 10/11/20  # of days Palliative referral response time 2 Day(s)  # of days IP prior to Palliative referral 17  Clinical Assessment   Palliative Performance Scale Score 30%  Psychosocial & Spiritual Assessment   Palliative Care Outcomes   Patient/Family meeting held? Yes  [name]       Patient Active Problem List   Diagnosis Date Noted   Chest pain    Shock circulatory (HCC)    Hypoxemia    Ileus (HCC)    RVF (right ventricular failure) (HCC)    Dyspnea    Acute and chronic respiratory failure (acute-on-chronic) (HCC)    Atrial fibrillation with RVR (HCC)    Elevated troponin 09/23/2020   CAP (community acquired pneumonia) 09/22/2020   Acute respiratory failure with hypoxia and hypercapnia (HCC) 09/22/2020   Septic shock (HCC) 09/22/2020   Elevated LFTs 09/22/2020   Cardiomegaly 09/22/2020    Palliative Care Assessment & Plan   Patient Profile: 80 y.o. female  with past medical history of COPD, A. fib on Eliquis, and former tobacco use admitted on 09/22/2020 with dyspnea, aches and cough after  COVID exposure.  She has had a prolonged hospital course after developing progressive respiratory failure from which she has become high flow nasal cannula dependent.  She has been treated for multifocal pneumonia.  She is also intermittently requiring BiPAP.  She has developed generalized ileus.  She has diastolic heart failure and atrial fibrillation on amiodarone and diltiazem.  She has been hypotensive on IV pressors.  She has AKI which is improving.  Unfortunately, she is not making significant gains or improvement.  Palliative consulted for goals of care.  Recommendations/Plan: -Agree with DNR/DNI -Await CT results, PMT to follow up with daughter on 10-15-20. Continue current mode of care, discussed with Dr Benjamine Mola [facility] who has discussed with the patient's daughter regarding her mother's current condition.     Goals of Care and Additional Recommendations: Limitations on Scope of Treatment: Full Scope Treatment  Code Status:    Code Status Orders  (From admission, onward)           Start     Ordered   09/27/20 0929  Do not attempt resuscitation (DNR)  Continuous        09/27/20 0928           Code Status History     Date Active Date Inactive Code Status Order ID Comments User Context   09/22/2020 2144 09/27/2020 0928 Full Code [id number]  Therisa Doyne, MD Inpatient       Prognosis:  Unable to determine  Discharge Planning: To Be Determined  Care plan was discussed with patient TRH MD  Thank you for allowing the Palliative Medicine Team to assist in the care of this patient.   Total Time 25 Prolonged Time Billed  no       Greater than 50%  of this  time was spent counseling and coordinating care related to the above assessment and plan.  Rosalin Hawking, MD  Please contact Palliative Medicine Team phone at [phone] for questions and concerns from 7 AM to 7 PM. After 7 PM, please call primary service.

## 2020-10-14 NOTE — Progress Notes (Signed)
Progress Note     Subjective: Patient denies abdominal pain. Denies nausea. 3 BM recorded for yesterday. Low output from NGT yesterday. Patient did get out of bed yesterday.   Objective: Vital signs in last 24 hours: Temp:  [97.4 F (36.3 C)-98.2 F (36.8 C)] 97.4 F (36.3 C) (09/13 0800) Pulse Rate:  [63-134] 92 (09/13 0900) Resp:  [13-35] 26 (09/13 0900) BP: (88-121)/(50-87) 109/65 (09/13 0900) SpO2:  [91 %-98 %] 93 % (09/13 0900) Last BM Date: 10/14/20  Intake/Output from previous day: 09/12 0701 - 09/13 0700 In: 1261.2 [I.V.:1063.4; IV Piggyback:197.7] Out: 1250 [Urine:800; Emesis/NG output:450] Intake/Output this shift: Total I/O In: 183.5 [I.V.:83.5; IV Piggyback:100] Out: 0   PE: General: pleasant, WD, chronically ill appearing female who is laying in bed in NAD Heart: irregularly irregular with rate in 90s-100s Lungs: rales in bilateral bases. getting breathing treatment  Abd: soft, NT, less distention, BS hypoactive, NGT with bilious appearing drainage   Lab Results:  Recent Labs    10/13/20 0245 10/14/20 0250  WBC 8.0 6.9  HGB 13.4 13.6  HCT 39.9 39.7  PLT 197 155   BMET Recent Labs    10/13/20 0245 10/14/20 0250  NA 135 131*  K 3.4* 3.9  CL 98 99  CO2 26 23  GLUCOSE 120* 120*  BUN 45* 32*  CREATININE 0.64 0.52  CALCIUM 7.8* 7.8*   PT/INR No results for input(s): LABPROT, INR in the last 72 hours. CMP     Component Value Date/Time   NA 131 (L) 10/14/2020 0250   K 3.9 10/14/2020 0250   CL 99 10/14/2020 0250   CO2 23 10/14/2020 0250   GLUCOSE 120 (H) 10/14/2020 0250   BUN 32 (H) 10/14/2020 0250   CREATININE 0.52 10/14/2020 0250   CALCIUM 7.8 (L) 10/14/2020 0250   PROT 5.4 (L) 10/14/2020 0250   ALBUMIN 2.2 (L) 10/14/2020 0250   AST 48 (H) 10/14/2020 0250   ALT 80 (H) 10/14/2020 0250   ALKPHOS 57 10/14/2020 0250   BILITOT 1.1 10/14/2020 0250   GFRNONAA >60 10/14/2020 0250   GFRAA  09/16/2009 1757    >60        The eGFR has been  calculated using the MDRD equation. This calculation has not been validated in all clinical situations. eGFR's persistently <60 mL/min signify possible Chronic Kidney Disease.   Lipase     Component Value Date/Time   LIPASE 54 (H) 09/30/2020 0250       Studies/Results: DG Abd Portable 1V  Result Date: 10/13/2020 CLINICAL DATA:  Small-bowel obstruction EXAM: PORTABLE ABDOMEN - 1 VIEW COMPARISON:  10/12/2020 FINDINGS: Multiple gas-filled dilated loops of small bowel are again seen within the mid abdomen in keeping with a mid to distal small bowel obstruction. Contrast from prior CT examination is again seen within several decompressed loops of distal small bowel as well as throughout the nondistended colon. Nasogastric tube tip is seen overlying the a gastric bubble within the left upper quadrant the abdomen. Left flank excluded from view. No gross free intraperitoneal gas. IMPRESSION: Persistent findings of high-grade partial or developing complete mid to distal small bowel obstruction. Electronically Signed   By: Fidela Salisbury M.D.   On: 10/13/2020 11:37    Anti-infectives: Anti-infectives (From admission, onward)    Start     Dose/Rate Route Frequency Ordered Stop   10/14/20 0815  ceFEPIme (MAXIPIME) 2 g in sodium chloride 0.9 % 100 mL IVPB        2 g  200 mL/hr over 30 Minutes Intravenous Every 8 hours 10/14/20 0718 10/16/20 2359   10/12/20 1200  ceFEPIme (MAXIPIME) 2 g in sodium chloride 0.9 % 100 mL IVPB  Status:  Discontinued        2 g 200 mL/hr over 30 Minutes Intravenous Every 12 hours 10/12/20 1133 10/14/20 0718   10/10/20 2000  ceFEPIme (MAXIPIME) 2 g in sodium chloride 0.9 % 100 mL IVPB  Status:  Discontinued        2 g 200 mL/hr over 30 Minutes Intravenous Every 24 hours 10/10/20 1812 10/12/20 1133   10/10/20 1930  vancomycin (VANCOREADY) IVPB 1500 mg/300 mL        1,500 mg 150 mL/hr over 120 Minutes Intravenous  Once 10/10/20 1812 10/10/20 2045   10/10/20 1838   vancomycin variable dose per unstable renal function (pharmacist dosing)  Status:  Discontinued         Does not apply See admin instructions 10/10/20 1838 10/12/20 0834   10/09/20 1000  fluconazole (DIFLUCAN) tablet 200 mg        200 mg Per Tube Daily 10/09/20 0826 10/10/20 0903   10/08/20 1000  fluconazole (DIFLUCAN) tablet 200 mg  Status:  Discontinued        200 mg Oral Daily 10/07/20 1426 10/09/20 0826   10/03/20 1700  fluconazole (DIFLUCAN) tablet 200 mg  Status:  Discontinued        200 mg Oral Daily 10/03/20 1606 10/07/20 1426   09/23/20 1800  cefTRIAXone (ROCEPHIN) 2 g in sodium chloride 0.9 % 100 mL IVPB        2 g 200 mL/hr over 30 Minutes Intravenous Every 24 hours 09/22/20 1842 09/29/20 1749   09/23/20 1800  azithromycin (ZITHROMAX) 500 mg in sodium chloride 0.9 % 250 mL IVPB  Status:  Discontinued        500 mg 250 mL/hr over 60 Minutes Intravenous Every 24 hours 09/22/20 1842 09/25/20 1456   09/22/20 1630  cefTRIAXone (ROCEPHIN) 1 g in sodium chloride 0.9 % 100 mL IVPB        1 g 200 mL/hr over 30 Minutes Intravenous  Once 09/22/20 1618 09/22/20 1806   09/22/20 1630  azithromycin (ZITHROMAX) 500 mg in sodium chloride 0.9 % 250 mL IVPB        500 mg 250 mL/hr over 60 Minutes Intravenous  Once 09/22/20 1618 09/22/20 1939        Assessment/Plan Severe constipation/ileus  - NGT placed 9/6 - 450 out in last 24h - film yesterday with minimal improvement in small bowel dilation secondary to large colonic stool burden - film today still shows dilated small bowel but patient does not seem to have risk factors for SBO and initial CT more suggestive of ileus than SBO - patient clinically has less pain and abdomen feels less distended - no indication for emergent surgical intervention and patient is a poor surgical candidate  - recommend keeping K>4.0 and Mg > 2.0 to optimize bowel function - mobilize!! Patient did get out of bed yesterday but with significant immobility over the  last week - Will discuss with GI today - could consider repeating SBO protocol or CT today to re-evaluate. Initial CT suggestive of constipation but also shows some tapering of distal colon, might be worth asking if flex sig waranted to rule out distal obstruction    FEN: ice chips and sips, IVF per TRH, NGT clamping as tolerated VTE: SCDs, SQH ID: [id number]/11>>   COPD with PNA  AKI A. Fib with RVR UTI RML pulmonary nodule  LOS: 22 days    [facility] 10/14/2020, 10:04 AM Please see Amion for pager number during day hours 7:00am-4:30pm

## 2020-10-15 ENCOUNTER — Other Ambulatory Visit: Payer: Self-pay | Admitting: *Deleted

## 2020-10-15 DIAGNOSIS — K5900 Constipation, unspecified: Secondary | ICD-10-CM | POA: Diagnosis not present

## 2020-10-15 DIAGNOSIS — J189 Pneumonia, unspecified organism: Secondary | ICD-10-CM | POA: Diagnosis not present

## 2020-10-15 DIAGNOSIS — J9620 Acute and chronic respiratory failure, unspecified whether with hypoxia or hypercapnia: Secondary | ICD-10-CM | POA: Diagnosis not present

## 2020-10-15 DIAGNOSIS — I517 Cardiomegaly: Secondary | ICD-10-CM | POA: Diagnosis not present

## 2020-10-15 DIAGNOSIS — L8990 Pressure ulcer of unspecified site, unspecified stage: Secondary | ICD-10-CM | POA: Diagnosis present

## 2020-10-15 DIAGNOSIS — R0600 Dyspnea, unspecified: Secondary | ICD-10-CM | POA: Diagnosis not present

## 2020-10-15 DIAGNOSIS — K56609 Unspecified intestinal obstruction, unspecified as to partial versus complete obstruction: Secondary | ICD-10-CM

## 2020-10-15 DIAGNOSIS — J9601 Acute respiratory failure with hypoxia: Secondary | ICD-10-CM | POA: Diagnosis not present

## 2020-10-15 DIAGNOSIS — I4891 Unspecified atrial fibrillation: Secondary | ICD-10-CM | POA: Diagnosis not present

## 2020-10-15 LAB — COMPREHENSIVE METABOLIC PANEL
ALT: 76 U/L — ABNORMAL HIGH (ref 0–44)
AST: 44 U/L — ABNORMAL HIGH (ref 15–41)
Albumin: 2.4 g/dL — ABNORMAL LOW (ref 3.5–5.0)
Alkaline Phosphatase: 51 U/L (ref 38–126)
Anion gap: 10 (ref 5–15)
BUN: 22 mg/dL (ref 8–23)
CO2: 24 mmol/L (ref 22–32)
Calcium: 7.9 mg/dL — ABNORMAL LOW (ref 8.9–10.3)
Chloride: 97 mmol/L — ABNORMAL LOW (ref 98–111)
Creatinine, Ser: 0.37 mg/dL — ABNORMAL LOW (ref 0.44–1.00)
GFR, Estimated: >60 mL/min (ref >60)
Glucose, Bld: 134 mg/dL — ABNORMAL HIGH (ref 70–99)
Potassium: 3.2 mmol/L — ABNORMAL LOW (ref 3.5–5.1)
Sodium: 131 mmol/L — ABNORMAL LOW (ref 135–145)
Total Bilirubin: 1.1 mg/dL (ref 0.3–1.2)
Total Protein: 5.3 g/dL — ABNORMAL LOW (ref 6.5–8.1)

## 2020-10-15 LAB — CBC
HCT: 36.3 % (ref 36.0–46.0)
Hemoglobin: 12.5 g/dL (ref 12.0–15.0)
MCH: 31.6 pg (ref 26.0–34.0)
MCHC: 34.4 g/dL (ref 30.0–36.0)
MCV: 91.7 fL (ref 80.0–100.0)
Platelets: 137 10*3/uL — ABNORMAL LOW (ref 150–400)
RBC: 3.96 MIL/uL (ref 3.87–5.11)
RDW: 14.1 % (ref 11.5–15.5)
WBC: 6 10*3/uL (ref 4.0–10.5)
nRBC: 0 % (ref 0.0–0.2)

## 2020-10-15 LAB — MAGNESIUM: Magnesium: 2 mg/dL (ref 1.7–2.4)

## 2020-10-15 LAB — GLUCOSE, CAPILLARY
Glucose-Capillary: 124 mg/dL — ABNORMAL HIGH (ref 70–99)
Glucose-Capillary: 120 mg/dL — ABNORMAL HIGH (ref 70–99)
Glucose-Capillary: 118 mg/dL — ABNORMAL HIGH (ref 70–99)
Glucose-Capillary: 133 mg/dL — ABNORMAL HIGH (ref 70–99)

## 2020-10-15 LAB — HEPARIN LEVEL (UNFRACTIONATED): Heparin Unfractionated: 0.55 IU/mL (ref 0.30–0.70)

## 2020-10-15 MED ORDER — AMIODARONE IV BOLUS ONLY 150 MG/100ML
150.0000 mg | Freq: Once | INTRAVENOUS | Status: AC
  Administered 2020-10-15: 150 mg via INTRAVENOUS
  Filled 2020-10-15: qty 100

## 2020-10-15 MED ORDER — POTASSIUM CHLORIDE 10 MEQ/100ML IV SOLN
10.0000 meq | INTRAVENOUS | Status: AC
  Administered 2020-10-15 (×6): 10 meq via INTRAVENOUS
  Filled 2020-10-15 (×6): qty 100

## 2020-10-15 NOTE — Progress Notes (Signed)
Progress Note  Patient Name: Kimberly Ballard Date of Encounter: 10/15/2020  [facility] Cardiologist: Armanda Magic, MD   Subjective   No acute overnight events. Very slow progress. Patient denies any significant improvement in her breathing; however, she has not required a BiPAP in a few days and O2 is still being able to be weaned. Currently on 4L via HFNC which is much improved from where she was earlier in the week. No chest pain or palpitations. Still having some abdominal pain. Per GI note this morning, patient nurse reported "large blood loose liquid bowel movement" overnight. Asked RN about this - she did state she got a report of a loose bowel movement but said she was not told anything about there being blood in it. I am wondering if this was a dictation error. Hemoglobin 12.5 today (13.6 yesterday).  Inpatient Medications    Scheduled Meds:  bisacodyl  10 mg Rectal BID   budesonide (PULMICORT) nebulizer solution  0.5 mg Nebulization BID   chlorhexidine  15 mL Mouth Rinse BID   Chlorhexidine Gluconate Cloth  6 each Topical Daily   insulin aspart  0-9 Units Subcutaneous TID WC   levalbuterol  1.25 mg Nebulization BID   liver oil-zinc oxide   Topical TID   mouth rinse  15 mL Mouth Rinse q12n4p   polyethylene glycol  17 g Per Tube TID   Continuous Infusions:  sodium chloride 250 mL (10/15/20 0442)   amiodarone 30 mg/hr (10/14/20 1850)   ceFEPime (MAXIPIME) IV Stopped (10/15/20 0605)   dextrose 5 % and 0.45% NaCl 10 mL/hr at 10/15/20 0625   heparin 900 Units/hr (10/15/20 0623)   potassium chloride Stopped (10/15/20 0659)   PRN Meds: sodium chloride, diphenhydrAMINE-zinc acetate, oxybutynin, simethicone, sodium chloride   Vital Signs    Vitals:   10/15/20 0600 10/15/20 0700 10/15/20 0725 10/15/20 0727  BP: 119/75 91/67    Pulse: (!) 121 (!) 119    Resp: 14 17    Temp:      TempSrc:      SpO2: 96% 94% 95% 96%  Weight:      Height:        Intake/Output Summary  (Last 24 hours) at 10/15/2020 0743 Last data filed at 10/15/2020 0741 Gross per 24 hour  Intake 1386.65 ml  Output 2425 ml  Net -1038.35 ml   Last 3 Weights 10/15/2020 10/14/2020 10/13/2020  Weight (lbs) 208 lb 5.4 oz 192 lb 7.4 oz 200 lb 6.4 oz  Weight (kg) 94.5 kg 87.3 kg 90.9 kg      Telemetry    Atrial fibrillation with mostly in the 90s to 110s (briefly in the 120s while I was in the room but this was after we got her to sit up in bed so I could listen to her back). - Personally Reviewed  ECG    No new ECG tracing since 10/03/2020. - Personally Reviewed  Physical Exam   GEN: Chronically ill appearing Caucasian female. No acute distress. NG tube in place. Neck: Supple. No JVD. Cardiac: Tachycardic with irregularly irregular rhythm. No murmurs, rubs, or gallops. Radial pulses 2+ and equal bilaterally.  Respiratory: On 4 L HFNC. No significant increased work of breathing. No to very faint crackles in bilateral to bases. No wheezing or rhonchi. Overall improved from earlier this admission. GI: NG tube in place. Soft, distended, and mildly tender to palpation. Hypoactive bowel sounds.  MS: No to trace lower extremity edema bilaterally. No deformity. Skin: Warm and  dry. Neuro:  No focal deficits. Psych: Flat affect. Responds appropriately.   Labs    High Sensitivity Troponin:   Recent Labs  Lab 09/24/20 0239 10/04/20 1721 10/04/20 1923 10/10/20 1225 10/11/20 0742  TROPONINIHS 52* 59* 67* 145* 104*      Chemistry Recent Labs  Lab 10/13/20 0245 10/14/20 0250 10/15/20 0314  NA 135 131* 131*  K 3.4* 3.9 3.2*  CL 98 99 97*  CO2 26 23 24   GLUCOSE 120* 120* 134*  BUN 45* 32* 22  CREATININE 0.64 0.52 0.37*  CALCIUM 7.8* 7.8* 7.9*  PROT 5.3* 5.4* 5.3*  ALBUMIN 2.1* 2.2* 2.4*  AST 45* 48* 44*  ALT 81* 80* 76*  ALKPHOS 58 57 51  BILITOT 1.3* 1.1 1.1  GFRNONAA >60 >60 >60  ANIONGAP 11 9 10      Hematology Recent Labs  Lab 10/13/20 0245 10/14/20 0250 10/15/20 0314   WBC 8.0 6.9 6.0  RBC 4.30 4.26 3.96  HGB 13.4 13.6 12.5  HCT 39.9 39.7 36.3  MCV 92.8 93.2 91.7  MCH 31.2 31.9 31.6  MCHC 33.6 34.3 34.4  RDW 14.2 14.1 14.1  PLT 197 155 137*    BNPNo results for input(s): BNP, PROBNP in the last 168 hours.   DDimer No results for input(s): DDIMER in the last 168 hours.   Radiology    CT ABDOMEN PELVIS W CONTRAST  Result Date: 10/14/2020 CLINICAL DATA:  Bowel obstruction suspected. EXAM: CT ABDOMEN AND PELVIS WITH CONTRAST TECHNIQUE: Multidetector CT imaging of the abdomen and pelvis was performed using the standard protocol following bolus administration of intravenous contrast. CONTRAST:  35mL OMNIPAQUE IOHEXOL 350 MG/ML SOLN COMPARISON:  CT abdomen pelvis 10/08/2020, CT abdomen pelvis 09/30/2020 FINDINGS: Lower chest: Trace bilateral pleural effusions with bilateral lobe atelectasis. Right pleural base calcifications. Tiny hiatal hernia. Hepatobiliary: The hepatic parenchyma is diffusely hypodense compared to the splenic parenchyma consistent with fatty infiltration. No focal liver abnormality. Status post cholecystectomy. No biliary dilatation. Pancreas: No focal lesion. Normal pancreatic contour. No surrounding inflammatory changes. No main pancreatic ductal dilatation. Spleen: Normal in size without focal abnormality. Adrenals/Urinary Tract: No adrenal nodule bilaterally. Bilateral kidneys enhance symmetrically. Subcentimeter hypodensities are too small to characterize. No hydronephrosis. No hydroureter. The urinary bladder is fully decompressed with Foley balloon and tip terminating within its lumen. On delayed imaging, there is no urothelial wall thickening and there are no filling defects in the opacified portions of the bilateral collecting systems or ureters. Stomach/Bowel: Enteric tube with tip and side port terminating within the gastric lumen. PO contrast is noted to reach the rectum. Stomach is within normal limits. Redemonstration of several  loops of proximal and mid small bowel dilatation with gas and fluid measuring up to 4.8 cm in caliber. There is a change in caliber of the small bowel within the mid abdomen in the setting of mesenteric swirling (5:46, 2:51) with associated distal small bowel that is fully collapsed but with its lumen filled with PO contrast. No pneumatosis. No evidence of large bowel wall thickening or dilatation. The rectum is under distended. Scattered sigmoid diverticulosis. The appendix not definitely identified. Vascular/Lymphatic: No abdominal aorta or iliac aneurysm. Severe atherosclerotic plaque of the aorta and its branches. No abdominal, pelvic, or inguinal lymphadenopathy. Reproductive: Status post hysterectomy. No adnexal masses. Other: Persistent trace free fluid along the pericolic gutters in the pelvis. No intraperitoneal free gas. No organized fluid collection. Musculoskeletal: No abdominal wall hernia or abnormality. No suspicious lytic or blastic osseous lesions. No acute  displaced fracture. Multilevel degenerative changes of the spine. IMPRESSION: 1. Findings suggestive of a partial small bowel obstruction with associated mesenteric swirling within the mid abdomen. Differential diagnosis includes an ileus. Small bowel measures up to 4.8 cm in caliber with PO contrast reaching the rectum. 2. Nonspecific persistent trace free fluid. 3. Scattered sigmoid diverticulosis with no acute diverticulitis. 4. Hepatic steatosis. 5. Bilateral trace pleural effusions with passive atelectasis. Superimposed infection/inflammation not excluded. 6. Enteric tube in good position. 7.  Aortic Atherosclerosis (ICD10-I70.0). Electronically Signed   By: Tish Frederickson M.D.   On: 10/14/2020 16:27   DG Abd Portable 1V  Result Date: 10/14/2020 CLINICAL DATA:  Ileus. EXAM: PORTABLE ABDOMEN - 1 VIEW COMPARISON:  October 13, 2020. FINDINGS: Stable small bowel dilatation is noted concerning for ileus or partial small bowel obstruction.  Contrast is noted in the colon. Nasogastric tube tip is seen in stomach. IMPRESSION: Stable small bowel dilatation is noted concerning for ileus or partial small bowel obstruction. Electronically Signed   By: Lupita Raider M.D.   On: 10/14/2020 10:12   DG Abd Portable 1V  Result Date: 10/13/2020 CLINICAL DATA:  Small-bowel obstruction EXAM: PORTABLE ABDOMEN - 1 VIEW COMPARISON:  10/12/2020 FINDINGS: Multiple gas-filled dilated loops of small bowel are again seen within the mid abdomen in keeping with a mid to distal small bowel obstruction. Contrast from prior CT examination is again seen within several decompressed loops of distal small bowel as well as throughout the nondistended colon. Nasogastric tube tip is seen overlying the a gastric bubble within the left upper quadrant the abdomen. Left flank excluded from view. No gross free intraperitoneal gas. IMPRESSION: Persistent findings of high-grade partial or developing complete mid to distal small bowel obstruction. Electronically Signed   By: Helyn Numbers M.D.   On: 10/13/2020 11:37    Cardiac Studies   Complete Echo 09/23/2020: Impressions:  1. Left ventricular ejection fraction, by estimation, is 55 to 60%. Left  ventricular ejection fraction by 3D volume is 59 %. The left ventricle has  normal function. The left ventricle has no regional wall motion  abnormalities. There is moderate left  ventricular hypertrophy. Left ventricular diastolic parameters are  consistent with Grade I diastolic dysfunction (impaired relaxation).   2. Right ventricular systolic function mildly reduced at base and mid,  with relative preserved apical systolic function. The right ventricular  size is moderately enlarged. There is moderately elevated pulmonary artery  systolic pressure. The estimated  right ventricular systolic pressure is 48.4 mmHg.   3. Left atrial size was mildly dilated.   4. Right atrial size was moderately dilated.   5. The mitral valve  is normal in structure. Trivial mitral valve  regurgitation. No evidence of mitral stenosis.   6. Tricuspid valve regurgitation is mild to moderate.   7. The aortic valve is normal in structure. Aortic valve regurgitation is  not visualized. No aortic stenosis is present.   8. The inferior vena cava is dilated in size with <50% respiratory  variability, suggesting right atrial pressure of 15 mmHg.  _______________   Limited Echo with Bubble Study 10/01/2020: Impressions: 1. Left ventricular ejection fraction, by estimation, is 60 to 65%. The  left ventricle has normal function. There is moderate concentric left  ventricular hypertrophy. Left ventricular diastolic function could not be  evaluated.   2. The mitral valve is grossly normal. Trivial mitral valve  regurgitation. No evidence of mitral stenosis.   3. The aortic valve is tricuspid. There is  mild calcification of the  aortic valve. Aortic valve regurgitation is trivial. No aortic stenosis is  present.   4. Agitated saline contrast bubble study was negative, with no evidence  of any interatrial shunt.   Conclusion(s)/Recommendation(s): Limited study, no major change from  recent study 09/23/20. Bubble study negative for intraatrial shunt.   Patient Profile     80 y.o. female with a history of COPD with prior tobacco use (quit in 2019) who was admitted on 09/22/2020 for acute hypoxic respiratory failure secondary to COPD exacerbation and multifocal pneumonia after presenting with shortness of breath, body aches, cough, and decreased appetite. Negative for COVID this admission. Cardiology consulted for elevated troponin. Did alter develop atrial fibrillation with RVR and was started on Cardizem and Eliquis with control of rates but not restoration of sinus rhythm Cardiology signed off on 09/26/2020 with plans to have patient follow-up in the [facility] and likely DCCV after 304 weeks of anticoagulation. We were reconsulted on 10/01/2020  given recurrent RVR.  Assessment & Plan    New Onset Atrial Fibrillation with RVR - In the setting of acute hypoxic respiratory failure secondary to COPD exacerbation and pneumonia. Rates reasonably well controlled in the 90s to 110s today.  - Potassium 3.2 today. Being repleted by primary team. - Magnesium pending. - TSH normal. - Echo as below. - Initially on Cardizem and Lopressor but discontinued due to hypotension requiring pressors.  - Continue IV Amiodarone. Initially were trying to avoid this given COPD and fatty liver with possible cirrhosis but options were limited. - She also received a few dose of IV Digoxin. Last dose on 10/13/2020. - Initially started on Eliquis. However, this was held due to hematuria. Hemoglobin stable at 13.6. Treated with CBI. Restarted IV Heparin on 9/12. Can switch to DOAC when clear she will not need any surgery for her ileus. - No plan for restoration of sinus rhythm at this time until there is substantial improvement in respiratory status. This can be discussed as an outpatient once patient recovers.   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  - Most recent chest x-ray on 10/04/2020 showed bilateral lower atelectasis or infiltrates slightly improved from prior study. - Echo showed LVEF of 59% but moderately enlarged RV with mildly reduced systolic function at the base and mid segments with relatively preserved apical systolic function. This can be seen in acute PE  but chest CTA negative for this. - Initially diuresed with IV Lasix but this was stopped on 9/7. Net negative -6.76 L this admission. I don't think weights are accurate the last few days (200 lbs >> 192 lbs >> 208 lbs today). Dry weight around 190 lbs. - Does not appear significant volume overloaded on exam.  - Will hold on additional Lasix at this time given persistently soft BP. - Will need to continue to monitor volume status  closely now that IV fluids are being restarted. - RV dysfunction felt to be secondary to pulmonary hypertension. This is felt to be chronic but worsened in the setting of acute COPD exacerbation and pneumonia. Dr. Mayford Knife recommends repeat Echo recommended once respiratory issues have resolved.   Hypotension/Shock - BP soft but stable. Systolic BP in the high 80s to 100s. - Phenylephrine weaned off yesterday. - IV fluids were restarted this morning. - Palliative Care has been consulted to help clarify goals of care given little progress. Consider central access so that CVP and COOX  can be monitored if aggressive care is going to persist.   Elevated Troponin - High-sensitivity troponin mildly elevated and flat. Initially peaked at 128 on 09/22/2020 and then downtrended. Increased again to 145 on 9/9 but again downtrending. - EKG showed T wave abnormality in anterior leads. No prior tracing for comparison. - Echo as above. - Troponin elevation not felt to be ACS and more consistent with demand. She does have coronary calcification on CT so may benefit from outpatient Lexiscan vs coronary CTA once she recovers from acute illness.    AKI - Resolved - Creatinine peaked at 1.69 on 9/10 but has since returned to baseline. Likely due to hypotension. - Creatinine 0.37 today. - Continue to monitor.   Hypokalemia - Potassium 3.2 today. Being repleted by primary team. - Continue to monitor.   Otherwise, per primary team: - Acute hypoxic/hypercapnic respiratory failure requiring intermittent BiPAP  - Possible sepsis - COPD Exacerbation - Multifocal pneumonia  - Abdominal pain - Transamnitis: stable - Hyponatremia - Hematuria: Started on CBI. Urology consulted. - Ileus: S/p NGT placement. General Surgery consulted.    For questions or updates, please contact [facility] Please consult [email or web address] for contact info under        Signed, Corrin Parker, PA-C  10/15/2020, 7:43 AM

## 2020-10-15 NOTE — TOC Progression Note (Signed)
Transition of Care [facility]) - Progression Note    Patient Details  Name: Kimberly Ballard MRN: [id number] Date of Birth: 13-Mar-1940  Transition of Care [facility]) CM/SW Contact  Golda Acre, RN Phone Number: 10/15/2020, 8:04 AM  Clinical Narrative:    Subjective: As per patient's nurse, overnight she had a large blood loose liquid bowel movement. She has 1000 mL of bilious fluid in suction canister via NG tube.     Objective: Vital signs in last 24 hours: Temp:  [97.4 F (36.3 C)-97.8 F (36.6 C)] 97.5 F (36.4 C) (09/14 0329) Pulse Rate:  [46-135] 119 (09/14 0700) Resp:  [13-28] 17 (09/14 0700) BP: (81-120)/(50-85) 91/67 (09/14 0700) SpO2:  [91 %-100 %] 96 % (09/14 0727) FiO2 (%):  [50 %] 50 % (09/14 0727) Weight:  [87.3 kg-94.5 kg] 94.5 kg (09/14 0259) Weight change: -3.6 kg Last BM Date: 10/14/20   PE: Ill-appearing, no pallor no icterus GENERAL: NG tube draining bilious fluid  ABDOMEN: Distended but slightly softer than yesterday, bowel sounds not audible EXTREMITIES: No deformity, no edema   Lab Results: Lab Results Last 48 Hours        Results for orders placed or performed during the hospital encounter of 09/22/20 (from the past 48 hour(s))  Glucose, capillary     Status: Abnormal    Collection Time: 10/13/20  7:48 AM  Result Value Ref Range    Glucose-Capillary 124 (H) 70 - 99 mg/dL      Comment: Glucose reference range applies only to samples taken after fasting for at least 8 hours.    Comment 1 Notify RN      Comment 2 Document in Chart    Glucose, capillary     Status: Abnormal    Collection Time: 10/13/20 11:52 AM  Result Value Ref Range    Glucose-Capillary 124 (H) 70 - 99 mg/dL      Comment: Glucose reference range applies only to samples taken after fasting for at least 8 hours.    Comment 1 Notify RN      Comment 2 Document in Chart    Glucose, capillary     Status: Abnormal    Collection Time: 10/13/20  5:42 PM  Result Value Ref Range     Glucose-Capillary 127 (H) 70 - 99 mg/dL      Comment: Glucose reference range applies only to samples taken after fasting for at least 8 hours.    Comment 1 Notify RN      Comment 2 Document in Chart    Heparin level (unfractionated)     Status: Abnormal    Collection Time: 10/13/20  7:15 PM  Result Value Ref Range    Heparin Unfractionated 1.10 (H) 0.30 - 0.70 IU/mL      Comment: (NOTE) The clinical reportable range upper limit is being lowered to >1.10 to align with the FDA approved guidance for the current laboratory assay.   If heparin results are below expected values, and patient dosage has  been confirmed, suggest follow up testing of antithrombin III levels. Performed at [facility], [address], [place], [place] [address]    Glucose, capillary     Status: Abnormal    Collection Time: 10/13/20  9:36 PM  Result Value Ref Range    Glucose-Capillary 108 (H) 70 - 99 mg/dL      Comment: Glucose reference range applies only to samples taken after fasting for at least 8 hours.  Heparin level (unfractionated)  Status: Abnormal    Collection Time: 10/13/20 11:46 PM  Result Value Ref Range    Heparin Unfractionated >1.10 (H) 0.30 - 0.70 IU/mL      Comment: (NOTE) The clinical reportable range upper limit is being lowered to >1.10 to align with the FDA approved guidance for the current laboratory assay.   If heparin results are below expected values, and patient dosage has  been confirmed, suggest follow up testing of antithrombin III levels. Performed at [facility], [address] [place], [place] [address]    Comprehensive metabolic panel     Status: Abnormal    Collection Time: 10/14/20  2:50 AM  Result Value Ref Range    Sodium 131 (L) 135 - 145 mmol/L    Potassium 3.9 3.5 - 5.1 mmol/L    Chloride 99 98 - 111 mmol/L    CO2 23 22 - 32 mmol/L    Glucose, Bld 120 (H) 70 - 99 mg/dL      Comment: Glucose reference range applies  only to samples taken after fasting for at least 8 hours.    BUN 32 (H) 8 - 23 mg/dL    Creatinine, Ser 4.43 0.44 - 1.00 mg/dL    Calcium 7.8 (L) 8.9 - 10.3 mg/dL    Total Protein 5.4 (L) 6.5 - 8.1 g/dL    Albumin 2.2 (L) 3.5 - 5.0 g/dL    AST 48 (H) 15 - 41 U/L    ALT 80 (H) 0 - 44 U/L    Alkaline Phosphatase 57 38 - 126 U/L    Total Bilirubin 1.1 0.3 - 1.2 mg/dL    GFR, Estimated >15 >40 mL/min      Comment: (NOTE) Calculated using the CKD-EPI Creatinine Equation (2021)      Anion gap 9 5 - 15      Comment: Performed at [facility], [address] [place], [place] [address]  CBC     Status: None    Collection Time: 10/14/20  2:50 AM  Result Value Ref Range    WBC 6.9 4.0 - 10.5 K/uL    RBC 4.26 3.87 - 5.11 MIL/uL    Hemoglobin 13.6 12.0 - 15.0 g/dL    HCT 19.5 09.3 - 26.7 %    MCV 93.2 80.0 - 100.0 fL    MCH 31.9 26.0 - 34.0 pg    MCHC 34.3 30.0 - 36.0 g/dL    RDW 12.4 58.0 - 99.8 %    Platelets 155 150 - 400 K/uL    nRBC 0.0 0.0 - 0.2 %      Comment: Performed at [facility], [address] [place], [place] [address]  Glucose, capillary     Status: Abnormal    Collection Time: 10/14/20  7:58 AM  Result Value Ref Range    Glucose-Capillary 128 (H) 70 - 99 mg/dL      Comment: Glucose reference range applies only to samples taken after fasting for at least 8 hours.    Comment 1 Notify RN      Comment 2 Document in Chart    Heparin level (unfractionated)     Status: Abnormal    Collection Time: 10/14/20  9:33 AM  Result Value Ref Range    Heparin Unfractionated 0.90 (H) 0.30 - 0.70 IU/mL      Comment: (NOTE) The clinical reportable range upper limit is being lowered to >1.10 to align with the FDA approved guidance for the current laboratory  assay.   If heparin results are below expected values, and patient dosage has  been confirmed, suggest follow up testing of antithrombin III levels. Performed at [facility], [address], [place], [place] [address]    Glucose, capillary     Status: Abnormal    Collection Time: 10/14/20 12:06 PM  Result Value Ref Range    Glucose-Capillary 118 (H) 70 - 99 mg/dL      Comment: Glucose reference range applies only to samples taken after fasting for at least 8 hours.    Comment 1 Notify RN      Comment 2 Document in Chart    Glucose, capillary     Status: Abnormal    Collection Time: 10/14/20  3:59 PM  Result Value Ref Range    Glucose-Capillary 124 (H) 70 - 99 mg/dL      Comment: Glucose reference range applies only to samples taken after fasting for at least 8 hours.    Comment 1 Notify RN      Comment 2 Document in Chart    Heparin level (unfractionated)     Status: None    Collection Time: 10/14/20  6:41 PM  Result Value Ref Range    Heparin Unfractionated 0.65 0.30 - 0.70 IU/mL      Comment: (NOTE) The clinical reportable range upper limit is being lowered to >1.10 to align with the FDA approved guidance for the current laboratory assay.   If heparin results are below expected values, and patient dosage has  been confirmed, suggest follow up testing of antithrombin III levels. Performed at [facility], [address] [place], [place] [address]    Glucose, capillary     Status: Abnormal    Collection Time: 10/14/20  9:25 PM  Result Value Ref Range    Glucose-Capillary 107 (H) 70 - 99 mg/dL      Comment: Glucose reference range applies only to samples taken after fasting for at least 8 hours.  Comprehensive metabolic panel     Status: Abnormal    Collection Time: 10/15/20  3:14 AM  Result Value Ref Range    Sodium 131 (L) 135 - 145 mmol/L    Potassium 3.2 (L) 3.5 - 5.1 mmol/L      Comment: DELTA CHECK NOTED    Chloride 97 (L) 98 - 111 mmol/L    CO2 24 22 - 32 mmol/L    Glucose, Bld 134 (H) 70 - 99 mg/dL      Comment: Glucose reference range applies only to samples taken after fasting for at least 8 hours.     BUN 22 8 - 23 mg/dL    Creatinine, Ser 1.06 (L) 0.44 - 1.00 mg/dL    Calcium 7.9 (L) 8.9 - 10.3 mg/dL    Total Protein 5.3 (L) 6.5 - 8.1 g/dL    Albumin 2.4 (L) 3.5 - 5.0 g/dL    AST 44 (H) 15 - 41 U/L    ALT 76 (H) 0 - 44 U/L    Alkaline Phosphatase 51 38 - 126 U/L    Total Bilirubin 1.1 0.3 - 1.2 mg/dL    GFR, Estimated >26 >94 mL/min      Comment: (NOTE) Calculated using the CKD-EPI Creatinine Equation (2021)      Anion gap 10 5 - 15      Comment: Performed at [facility], [address], [place], [place] [address]  CBC     Status: Abnormal  Collection Time: 10/15/20  3:14 AM  Result Value Ref Range    WBC 6.0 4.0 - 10.5 K/uL    RBC 3.96 3.87 - 5.11 MIL/uL    Hemoglobin 12.5 12.0 - 15.0 g/dL    HCT 16.1 09.6 - 04.5 %    MCV 91.7 80.0 - 100.0 fL    MCH 31.6 26.0 - 34.0 pg    MCHC 34.4 30.0 - 36.0 g/dL    RDW 40.9 81.1 - 91.4 %    Platelets 137 (L) 150 - 400 K/uL      Comment: SPECIMEN CHECKED FOR CLOTS CONSISTENT WITH PREVIOUS RESULT REPEATED TO VERIFY      nRBC 0.0 0.0 - 0.2 %      Comment: Performed at [facility], [address], [place], [place] [address]  Heparin level (unfractionated)     Status: None    Collection Time: 10/15/20  3:14 AM  Result Value Ref Range    Heparin Unfractionated 0.55 0.30 - 0.70 IU/mL      Comment: (NOTE) The clinical reportable range upper limit is being lowered to >1.10 to align with the FDA approved guidance for the current laboratory assay.   If heparin results are below expected values, and patient dosage has  been confirmed, suggest follow up testing of antithrombin III levels. Performed at [facility], [address], [place], [place] [address]    Glucose, capillary     Status: Abnormal    Collection Time: 10/15/20  7:37 AM  Result Value Ref Range    Glucose-Capillary 124 (H) 70 - 99 mg/dL      Comment: Glucose reference range applies only to samples taken after  fasting for at least 8 hours.        Studies/Results:  Imaging Results (Last 48 hours)  CT ABDOMEN PELVIS W CONTRAST   Result Date: 10/14/2020 CLINICAL DATA:  Bowel obstruction suspected. EXAM: CT ABDOMEN AND PELVIS WITH CONTRAST TECHNIQUE: Multidetector CT imaging of the abdomen and pelvis was performed using the standard protocol following bolus administration of intravenous contrast. CONTRAST:  70mL OMNIPAQUE IOHEXOL 350 MG/ML SOLN COMPARISON:  CT abdomen pelvis 10/08/2020, CT abdomen pelvis 09/30/2020 FINDINGS: Lower chest: Trace bilateral pleural effusions with bilateral lobe atelectasis. Right pleural base calcifications. Tiny hiatal hernia. Hepatobiliary: The hepatic parenchyma is diffusely hypodense compared to the splenic parenchyma consistent with fatty infiltration. No focal liver abnormality. Status post cholecystectomy. No biliary dilatation. Pancreas: No focal lesion. Normal pancreatic contour. No surrounding inflammatory changes. No main pancreatic ductal dilatation. Spleen: Normal in size without focal abnormality. Adrenals/Urinary Tract: No adrenal nodule bilaterally. Bilateral kidneys enhance symmetrically. Subcentimeter hypodensities are too small to characterize. No hydronephrosis. No hydroureter. The urinary bladder is fully decompressed with Foley balloon and tip terminating within its lumen. On delayed imaging, there is no urothelial wall thickening and there are no filling defects in the opacified portions of the bilateral collecting systems or ureters. Stomach/Bowel: Enteric tube with tip and side port terminating within the gastric lumen. PO contrast is noted to reach the rectum. Stomach is within normal limits. Redemonstration of several loops of proximal and mid small bowel dilatation with gas and fluid measuring up to 4.8 cm in caliber. There is a change in caliber of the small bowel within the mid abdomen in the setting of mesenteric swirling (5:46, 2:51) with associated distal  small bowel that is fully collapsed but with its lumen filled with PO contrast. No pneumatosis. No evidence of large bowel wall  thickening or dilatation. The rectum is under distended. Scattered sigmoid diverticulosis. The appendix not definitely identified. Vascular/Lymphatic: No abdominal aorta or iliac aneurysm. Severe atherosclerotic plaque of the aorta and its branches. No abdominal, pelvic, or inguinal lymphadenopathy. Reproductive: Status post hysterectomy. No adnexal masses. Other: Persistent trace free fluid along the pericolic gutters in the pelvis. No intraperitoneal free gas. No organized fluid collection. Musculoskeletal: No abdominal wall hernia or abnormality. No suspicious lytic or blastic osseous lesions. No acute displaced fracture. Multilevel degenerative changes of the spine. IMPRESSION: 1. Findings suggestive of a partial small bowel obstruction with associated mesenteric swirling within the mid abdomen. Differential diagnosis includes an ileus. Small bowel measures up to 4.8 cm in caliber with PO contrast reaching the rectum. 2. Nonspecific persistent trace free fluid. 3. Scattered sigmoid diverticulosis with no acute diverticulitis. 4. Hepatic steatosis. 5. Bilateral trace pleural effusions with passive atelectasis. Superimposed infection/inflammation not excluded. 6. Enteric tube in good position. 7.  Aortic Atherosclerosis (ICD10-I70.0). Electronically Signed   By: Tish Frederickson M.D.   On: 10/14/2020 16:27    DG Abd Portable 1V   Result Date: 10/14/2020 CLINICAL DATA:  Ileus. EXAM: PORTABLE ABDOMEN - 1 VIEW COMPARISON:  October 13, 2020. FINDINGS: Stable small bowel dilatation is noted concerning for ileus or partial small bowel obstruction. Contrast is noted in the colon. Nasogastric tube tip is seen in stomach. IMPRESSION: Stable small bowel dilatation is noted concerning for ileus or partial small bowel obstruction. Electronically Signed   By: Lupita Raider M.D.   On: 10/14/2020  10:12    DG Abd Portable 1V   Result Date: 10/13/2020 CLINICAL DATA:  Small-bowel obstruction EXAM: PORTABLE ABDOMEN - 1 VIEW COMPARISON:  10/12/2020 FINDINGS: Multiple gas-filled dilated loops of small bowel are again seen within the mid abdomen in keeping with a mid to distal small bowel obstruction. Contrast from prior CT examination is again seen within several decompressed loops of distal small bowel as well as throughout the nondistended colon. Nasogastric tube tip is seen overlying the a gastric bubble within the left upper quadrant the abdomen. Left flank excluded from view. No gross free intraperitoneal gas. IMPRESSION: Persistent findings of high-grade partial or developing complete mid to distal small bowel obstruction. Electronically Signed   By: Helyn Numbers M.D.   On: 10/13/2020 11:37       Medications: I have reviewed the patient's current medications.   Assessment: CAT scan showed partial small bowel obstruction with associated mesenteric swelling within mid abdomen, small bowel measures up to 4.8 cm in diameter with p.o. contrast reaching rectum   Scattered sigmoid diverticulosis, no evidence of large bowel wall thickening or dilation, under distended rectum   Hypokalemia, potassium 3.2   Plan: Surgical team on board Ongoing conservative management with NG tube decompression, MiraLAX via NG tube and Dulcolax suppository No significant change in clinical condition. Guarded prognosis.  TOC PLAN OF CARE: Following dc plan for progression, unable to determine dc plan at this time. Possible snf placement   Expected Discharge Plan: Home/Self Care Barriers to Discharge: Continued Medical Work up  Expected Discharge Plan and Services Expected Discharge Plan: Home/Self Care   Discharge Planning Services: CM Consult   Living arrangements for the past 2 months: Single Family Home                                       Social Determinants of Health (  SDOH)  Interventions    Readmission Risk Interventions No flowsheet data found.

## 2020-10-15 NOTE — Progress Notes (Addendum)
ANTICOAGULATION CONSULT NOTE   Pharmacy Consult for heparin Indication: atrial fibrillation  No Known Allergies  Patient Measurements: Height: 5\' 3"  (160 cm) Weight: 94.5 kg (208 lb 5.4 oz) IBW/kg (Calculated) : 52.4 Heparin Dosing Weight: 84.6 kg  Vital Signs: Temp: 97.5 F (36.4 C) (09/14 0329) Temp Source: Oral (09/14 0329) BP: 91/67 (09/14 0700) Pulse Rate: 119 (09/14 0700)  Labs: Recent Labs    10/13/20 0245 10/13/20 1915 10/14/20 0250 10/14/20 0933 10/14/20 1841 10/15/20 0314  HGB 13.4  --  13.6  --   --  12.5  HCT 39.9  --  39.7  --   --  36.3  PLT 197  --  155  --   --  137*  HEPARINUNFRC  --    < >  --  0.90* 0.65 0.55  CREATININE 0.64  --  0.52  --   --  0.37*   < > = values in this interval not displayed.   Estimated Creatinine Clearance: 62.3 mL/min (A) (by C-G formula based on SCr of 0.37 mg/dL (L)).  Medical History: Past Medical History:  Diagnosis Date   COPD (chronic obstructive pulmonary disease) (HCC)    Medications:  Scheduled:   bisacodyl  10 mg Rectal BID   budesonide (PULMICORT) nebulizer solution  0.5 mg Nebulization BID   chlorhexidine  15 mL Mouth Rinse BID   Chlorhexidine Gluconate Cloth  6 each Topical Daily   insulin aspart  0-9 Units Subcutaneous TID WC   levalbuterol  1.25 mg Nebulization BID   liver oil-zinc oxide   Topical TID   mouth rinse  15 mL Mouth Rinse q12n4p   polyethylene glycol  17 g Per Tube TID   Infusions:   sodium chloride 250 mL (10/15/20 0442)   amiodarone 30 mg/hr (10/15/20 0745)   ceFEPime (MAXIPIME) IV Stopped (10/15/20 0605)   dextrose 5 % and 0.45% NaCl 10 mL/hr at 10/15/20 0625   heparin 900 Units/hr (10/15/20 0623)   potassium chloride 10 mEq (10/15/20 0743)   Assessment: 80 yo female with pertinent PMH including atrial fibrillation found to have ileus.  Calculated CHADs-VASc = 3.  Patient on apixaban PTA however was held on 9/2 due to hematuria.  CCS following and says no emergent surgical  intervention.  Patient is likely a poor surgical candidate overall.   Cardiology recommends holding DOAC for now in case patient does require surgical intervention of ileus.  Received heparin 5000 units subq 9/12 @ 0522.  Last dose apixaban 9/2.  Pharmacy consulted to dose IV heparin now that hematuria has resolved.    Hemoglobin and platelets have been slowly downtrending throughout admit prior to heparin being initiated on 9/12. Will continue to monitor.   Goal of Therapy:  Heparin level 0.3-0.7 units/ml Monitor platelets by anticoagulation protocol: Yes   10/15/20 Daily HL therapeutic at 0.55 with heparin at 900 units/hr Discussed with RN - no signs of bleeding/hematuria  Plan:  Continue Heparin drip at 900 units/hr Daily Heparin level Monitor daily CBC, s/sx bleeding F/u ability to transition back to DOAC  10/17/20, PharmD 10/15/2020 7:49 AM

## 2020-10-15 NOTE — Progress Notes (Signed)
K+ 3.2 Replaced per protocol  

## 2020-10-15 NOTE — Progress Notes (Signed)
Progress Note     Subjective: Patient with large liquid loose BM overnight. NGT with minimal output. Patient continues to deny significant abdominal pain.   Objective: Vital signs in last 24 hours: Temp:  [96.7 F (35.9 C)-97.8 F (36.6 C)] 96.7 F (35.9 C) (09/14 0800) Pulse Rate:  [46-135] 119 (09/14 0700) Resp:  [13-28] 17 (09/14 0700) BP: (81-120)/(50-85) 91/67 (09/14 0700) SpO2:  [91 %-100 %] 96 % (09/14 0727) FiO2 (%):  [50 %] 50 % (09/14 0727) Weight:  [94.5 kg] 94.5 kg (09/14 0259) Last BM Date: 10/14/20  Intake/Output from previous day: 09/13 0701 - 09/14 0700 In: 1386.7 [I.V.:896.7; NG/GT:90; IV Piggyback:400] Out: 1025 [Urine:925; Emesis/NG output:100] Intake/Output this shift: Total I/O In: -  Out: 1400 [Urine:400; Emesis/NG output:1000]  PE: General: pleasant, WD, chronically ill appearing female who is laying in bed in NAD Heart: irregularly irregular with rate in 90s-100s Lungs: rales in bilateral bases. getting breathing treatment  Abd: soft, NT, less distention, BS hypoactive, NGT with bilious appearing drainage   Lab Results:  Recent Labs    10/14/20 0250 10/15/20 0314  WBC 6.9 6.0  HGB 13.6 12.5  HCT 39.7 36.3  PLT 155 137*   BMET Recent Labs    10/14/20 0250 10/15/20 0314  NA 131* 131*  K 3.9 3.2*  CL 99 97*  CO2 23 24  GLUCOSE 120* 134*  BUN 32* 22  CREATININE 0.52 0.37*  CALCIUM 7.8* 7.9*   PT/INR No results for input(s): LABPROT, INR in the last 72 hours. CMP     Component Value Date/Time   NA 131 (L) 10/15/2020 0314   K 3.2 (L) 10/15/2020 0314   CL 97 (L) 10/15/2020 0314   CO2 24 10/15/2020 0314   GLUCOSE 134 (H) 10/15/2020 0314   BUN 22 10/15/2020 0314   CREATININE 0.37 (L) 10/15/2020 0314   CALCIUM 7.9 (L) 10/15/2020 0314   PROT 5.3 (L) 10/15/2020 0314   ALBUMIN 2.4 (L) 10/15/2020 0314   AST 44 (H) 10/15/2020 0314   ALT 76 (H) 10/15/2020 0314   ALKPHOS 51 10/15/2020 0314   BILITOT 1.1 10/15/2020 0314    GFRNONAA >60 10/15/2020 0314   GFRAA  09/16/2009 1757    >60        The eGFR has been calculated using the MDRD equation. This calculation has not been validated in all clinical situations. eGFR's persistently <60 mL/min signify possible Chronic Kidney Disease.   Lipase     Component Value Date/Time   LIPASE 54 (H) 09/30/2020 0250       Studies/Results: CT ABDOMEN PELVIS W CONTRAST  Result Date: 10/14/2020 CLINICAL DATA:  Bowel obstruction suspected. EXAM: CT ABDOMEN AND PELVIS WITH CONTRAST TECHNIQUE: Multidetector CT imaging of the abdomen and pelvis was performed using the standard protocol following bolus administration of intravenous contrast. CONTRAST:  64m OMNIPAQUE IOHEXOL 350 MG/ML SOLN COMPARISON:  CT abdomen pelvis 10/08/2020, CT abdomen pelvis 09/30/2020 FINDINGS: Lower chest: Trace bilateral pleural effusions with bilateral lobe atelectasis. Right pleural base calcifications. Tiny hiatal hernia. Hepatobiliary: The hepatic parenchyma is diffusely hypodense compared to the splenic parenchyma consistent with fatty infiltration. No focal liver abnormality. Status post cholecystectomy. No biliary dilatation. Pancreas: No focal lesion. Normal pancreatic contour. No surrounding inflammatory changes. No main pancreatic ductal dilatation. Spleen: Normal in size without focal abnormality. Adrenals/Urinary Tract: No adrenal nodule bilaterally. Bilateral kidneys enhance symmetrically. Subcentimeter hypodensities are too small to characterize. No hydronephrosis. No hydroureter. The urinary bladder is fully decompressed with Foley balloon  and tip terminating within its lumen. On delayed imaging, there is no urothelial wall thickening and there are no filling defects in the opacified portions of the bilateral collecting systems or ureters. Stomach/Bowel: Enteric tube with tip and side port terminating within the gastric lumen. PO contrast is noted to reach the rectum. Stomach is within normal  limits. Redemonstration of several loops of proximal and mid small bowel dilatation with gas and fluid measuring up to 4.8 cm in caliber. There is a change in caliber of the small bowel within the mid abdomen in the setting of mesenteric swirling (5:46, 2:51) with associated distal small bowel that is fully collapsed but with its lumen filled with PO contrast. No pneumatosis. No evidence of large bowel wall thickening or dilatation. The rectum is under distended. Scattered sigmoid diverticulosis. The appendix not definitely identified. Vascular/Lymphatic: No abdominal aorta or iliac aneurysm. Severe atherosclerotic plaque of the aorta and its branches. No abdominal, pelvic, or inguinal lymphadenopathy. Reproductive: Status post hysterectomy. No adnexal masses. Other: Persistent trace free fluid along the pericolic gutters in the pelvis. No intraperitoneal free gas. No organized fluid collection. Musculoskeletal: No abdominal wall hernia or abnormality. No suspicious lytic or blastic osseous lesions. No acute displaced fracture. Multilevel degenerative changes of the spine. IMPRESSION: 1. Findings suggestive of a partial small bowel obstruction with associated mesenteric swirling within the mid abdomen. Differential diagnosis includes an ileus. Small bowel measures up to 4.8 cm in caliber with PO contrast reaching the rectum. 2. Nonspecific persistent trace free fluid. 3. Scattered sigmoid diverticulosis with no acute diverticulitis. 4. Hepatic steatosis. 5. Bilateral trace pleural effusions with passive atelectasis. Superimposed infection/inflammation not excluded. 6. Enteric tube in good position. 7.  Aortic Atherosclerosis (ICD10-I70.0). Electronically Signed   By: Iven Finn M.D.   On: 10/14/2020 16:27   DG Abd Portable 1V  Result Date: 10/14/2020 CLINICAL DATA:  Ileus. EXAM: PORTABLE ABDOMEN - 1 VIEW COMPARISON:  October 13, 2020. FINDINGS: Stable small bowel dilatation is noted concerning for ileus  or partial small bowel obstruction. Contrast is noted in the colon. Nasogastric tube tip is seen in stomach. IMPRESSION: Stable small bowel dilatation is noted concerning for ileus or partial small bowel obstruction. Electronically Signed   By: Marijo Conception M.D.   On: 10/14/2020 10:12    Anti-infectives: Anti-infectives (From admission, onward)    Start     Dose/Rate Route Frequency Ordered Stop   10/14/20 0815  ceFEPIme (MAXIPIME) 2 g in sodium chloride 0.9 % 100 mL IVPB        2 g 200 mL/hr over 30 Minutes Intravenous Every 8 hours 10/14/20 0718 10/16/20 2359   10/12/20 1200  ceFEPIme (MAXIPIME) 2 g in sodium chloride 0.9 % 100 mL IVPB  Status:  Discontinued        2 g 200 mL/hr over 30 Minutes Intravenous Every 12 hours 10/12/20 1133 10/14/20 0718   10/10/20 2000  ceFEPIme (MAXIPIME) 2 g in sodium chloride 0.9 % 100 mL IVPB  Status:  Discontinued        2 g 200 mL/hr over 30 Minutes Intravenous Every 24 hours 10/10/20 1812 10/12/20 1133   10/10/20 1930  vancomycin (VANCOREADY) IVPB 1500 mg/300 mL        1,500 mg 150 mL/hr over 120 Minutes Intravenous  Once 10/10/20 1812 10/10/20 2045   10/10/20 1838  vancomycin variable dose per unstable renal function (pharmacist dosing)  Status:  Discontinued         Does not apply  See admin instructions 10/10/20 1838 10/12/20 0834   10/09/20 1000  fluconazole (DIFLUCAN) tablet 200 mg        200 mg Per Tube Daily 10/09/20 0826 10/10/20 0903   10/08/20 1000  fluconazole (DIFLUCAN) tablet 200 mg  Status:  Discontinued        200 mg Oral Daily 10/07/20 1426 10/09/20 0826   10/03/20 1700  fluconazole (DIFLUCAN) tablet 200 mg  Status:  Discontinued        200 mg Oral Daily 10/03/20 1606 10/07/20 1426   09/23/20 1800  cefTRIAXone (ROCEPHIN) 2 g in sodium chloride 0.9 % 100 mL IVPB        2 g 200 mL/hr over 30 Minutes Intravenous Every 24 hours 09/22/20 1842 09/29/20 1749   09/23/20 1800  azithromycin (ZITHROMAX) 500 mg in sodium chloride 0.9 % 250 mL  IVPB  Status:  Discontinued        500 mg 250 mL/hr over 60 Minutes Intravenous Every 24 hours 09/22/20 1842 09/25/20 1456   09/22/20 1630  cefTRIAXone (ROCEPHIN) 1 g in sodium chloride 0.9 % 100 mL IVPB        1 g 200 mL/hr over 30 Minutes Intravenous  Once 09/22/20 1618 09/22/20 1806   09/22/20 1630  azithromycin (ZITHROMAX) 500 mg in sodium chloride 0.9 % 250 mL IVPB        500 mg 250 mL/hr over 60 Minutes Intravenous  Once 09/22/20 1618 09/22/20 1939        Assessment/Plan Ileus vs PSBO - NGT placed 9/6 - 100 out in last 24h, bilious  - CT yesterday with PSBO vs ileus, some mesenteric swirling noted in mid abdomen but contrast reaches to level of rectum which would argue against SBO - patient does not have significant abdominal pain that would be suggestive of vascular compromise of bowel  - recommend keeping K>4.0 and Mg > 2.0 to optimize bowel function - mobilize!!! - patient continues to have liquid BMs - removed NGT and recommend starting CLD   FEN: CLD VTE: SCDs, SQH ID: [id number]/11>>   Hypokalemia - K 3.2 this AM, IV replacement per primary, goal as outlined above COPD with PNA AKI A. Fib with RVR UTI RML pulmonary nodule  LOS: 23 days    Norm Parcel, [facility] 10/15/2020, 10:24 AM Please see Amion for pager number during day hours 7:00am-4:30pm

## 2020-10-15 NOTE — Plan of Care (Signed)
  Problem: Nutrition: Goal: Adequate nutrition will be maintained Outcome: Not Progressing   Problem: Pain Managment: Goal: General experience of comfort will improve Outcome: Not Progressing   

## 2020-10-15 NOTE — Progress Notes (Signed)
Pt refused nocturnal bipap, remains on 4l [place] with spo2>92%.  Pt was advised that RT is available all night should she change her mind.  RT will assist as needed.

## 2020-10-15 NOTE — Progress Notes (Signed)
Occupational Therapy Treatment Patient Details Name: Kimberly Ballard MRN: [id number] DOB: [birth year] Today's Date: 10/15/2020   History of present illness Patient is a 80 year old female presented on 09/22/20 with shortness of breath, body aches, decreased appetite and coughing. Per DTR patient diagnosed with COVID week previous to admission. PMH includes COPD.  CT chest ruled out PE but shows evidence of bilateral infiltrates.  Patient was started on Rocephin and Zithromax for community-acquired pneumonia.  Subsequently she decompensated and worsened and went into A. fib with RVR and was placed on BiPAP and admitted in the stepdown.   Patient also developed hematuria, urology was consulted.   OT comments  Patient has had minimal therapy since evaluation on 8/25 due to medical issues that limited therapy. Today patient presents profoundly weak and with a flat affect. Patient total assist to transfer in to sitting with patient not trying to assist. Patient was able to side at edge of bed and maintain balance with min guard. Therapist provided PROM to upper extremities at side of bed in alternating movements. Attempted to stand with assist at side of bed with walker but patient not exhibiting any ability to power up despite physical assistance. PT in room to assist with transfers. Attempted standing x 2 with stedy and elevated bed - once again despite +2 assistance unable to lift patient in to standing (unable to lift buttocks off of bed either). Patient total assist to return to supine. Patient encouraged to perform exercises in bed - given demonstration. Patient has exhibited a decline in abilities - goals updated.   Recommendations for follow up therapy are one component of a multi-disciplinary discharge planning process, led by the attending physician.  Recommendations may be updated based on patient status, additional functional criteria and insurance authorization.    Follow Up Recommendations  SNF     Equipment Recommendations  Other (comment) (defer to next venue)    Recommendations for Other Services      Precautions / Restrictions Precautions Precautions: Fall       Mobility Bed Mobility Overal bed mobility: Needs Assistance Bed Mobility: Supine to Sit     Supine to sit: HOB elevated;Total assist     General bed mobility comments: total assist to transfer to side of the bed needing assistance for lower legs, trunk negotiation and use of bed pad to pivot hips. Patient doesn't attempt to assist - patient profoundly weak    Transfers                 General transfer comment: attempted to stand with walker and patient unable to power up despite physical asistance. attempted x 2 with stedy (total assist x 2) and use of elevated bed - patient unable to power up and unable to get patient in to standing    Balance Overall balance assessment: Needs assistance Sitting-balance support: No upper extremity supported;Feet supported Sitting balance-Leahy Scale: Fair                                     ADL either performed or assessed with clinical judgement   ADL                                               Vision Patient Visual Report: No change from  baseline     Perception     Praxis      Cognition Arousal/Alertness: Awake/alert Behavior During Therapy: Flat affect Overall Cognitive Status: Within Functional Limits for tasks assessed                                          Exercises Other Exercises Other Exercises: encouarged upper and lower extremity movement in bed   Shoulder Instructions       General Comments      Pertinent Vitals/ Pain       Pain Assessment: No/denies pain  Home Living                                          Prior Functioning/Environment              Frequency  Min 2X/week        Progress Toward Goals  OT Goals(current goals can now be  found in the care plan section)  Progress towards OT goals: Goals drowngraded-see care plan  Acute Rehab OT Goals Patient Stated Goal: get stronger to get out of bed OT Goal Formulation: With patient Time For Goal Achievement: 10/29/20 Potential to Achieve Goals: Fair  Plan Discharge plan remains appropriate    Co-evaluation    PT/OT/SLP Co-Evaluation/Treatment: Yes Reason for Co-Treatment: For patient/therapist safety;To address functional/ADL transfers;Complexity of the patient's impairments (multi-system involvement)   OT goals addressed during session: Other (comment);Strengthening/ROM (activity tolerance, functional mobility)      AM-PAC OT "6 Clicks" Daily Activity     Outcome Measure   Help from another person eating meals?: A Little Help from another person taking care of personal grooming?: A Little Help from another person toileting, which includes using toliet, bedpan, or urinal?: Total Help from another person bathing (including washing, rinsing, drying)?: A Lot Help from another person to put on and taking off regular upper body clothing?: A Lot Help from another person to put on and taking off regular lower body clothing?: Total 6 Click Score: 12    End of Session Equipment Utilized During Treatment: Oxygen  OT Visit Diagnosis: Unsteadiness on feet (R26.81);Other abnormalities of gait and mobility (R26.89);Muscle weakness (generalized) (M62.81)   Activity Tolerance Patient limited by fatigue   Patient Left in chair;with call bell/phone within reach;with chair alarm set   Nurse Communication  (okay to see per RN)        Time: 6578-4696 OT Time Calculation (min): 27 min  Charges: OT General Charges $OT Visit: 1 Visit OT Treatments $Therapeutic Activity: 8-22 mins  Waldron Session, OTR/L Acute Care Rehab Services  Office [phone] Pager: [phone]   Kelli Churn 10/15/2020, 1:45 PM

## 2020-10-15 NOTE — Progress Notes (Addendum)
PROGRESS NOTE    Kimberly Ballard  WKM:[id number] DOB: 12/20/1940 DOA: 09/22/2020 PCP: System, Provider Not In    Chief Complaint  Patient presents with   Shortness of Breath    Brief Narrative:  Kimberly Ballard is an 80 y.o. female with a history of COPD presented with shortness of breath.  hypoxic respiratory failure.  Reported COVID exposure but tested negative.  CTA chest ruled out pulmonary embolism but showed bilateral infiltrates, hospital course complicated by multiple issues including AKI, hematuria, a fib with RVR and ileus/obstruction. Multiple subspecialty have been following her, she does not appear to make any progress, palliative care consulted as well for goals of care discussion.  Plan for 9/13: repeat CT Scan.       Assessment & Plan:   Principal Problem:   Acute respiratory failure with hypoxia and hypercapnia (HCC) Active Problems:   Atrial fibrillation with RVR (HCC)   Ileus (HCC)   CAP (community acquired pneumonia)   Septic shock (HCC)   Elevated LFTs   Cardiomegaly   Elevated troponin   Acute and chronic respiratory failure (acute-on-chronic) (HCC)   Dyspnea   Hypoxemia   RVF (right ventricular failure) (HCC)   Shock circulatory (HCC)   Chest pain   Palliative care by specialist   Goals of care, counseling/discussion   General weakness   Pressure injury of skin  #1 acute hypoxemic/hypercapnic respiratory failure with history of COPD -On presentation patient initially required BiPAP however currently on high flow nasal cannula at 3 L with sats of 91%. -CT angiogram chest done negative for PE, however concerning for multifocal pneumonia. -COVID-19 screening negative and serology negative, respiratory viral panel negative, autoimmune panel largely unremarkable, blood cultures with no growth to date. -Patient initially was treated with IV Rocephin and azithromycin and steroids with repeat chest x-ray showing no significant improvement. -Procalcitonin noted  to be elevated on 9/9 of 2.6 and patient placed on Vanco and cefepime. -Vancomycin discontinued. -Patient was seen and followed by PCCM, Dr. Marchelle Gearing who recommended cefepime x7 days. -Continue Pulmicort nebs, Xopenex nebs, IV antibiotics.  2.  Severe constipation versus ileus versus small bowel obstruction -Patient seen in consultation by GI and general surgery was on NG tube for decompression which was subsequently removed this morning per general surgery. -Patient noted to have large loose stools yesterday. -Trial of clear liquids. -Per GI and general surgery.  3.  A. fib with RVR and hypotension/shock -Was on Neo-Synephrine drip which has subsequently been discontinued. -Patient received a dose of IV albumin earlier this morning. -Currently on amiodarone drip per cardiology. -Daily EKG ordered per cardiology to follow-up on QTC as patient on amiodarone. -Amiodarone 150 mg bolus given this morning per cardiology recommendations to continue amiodarone drip. -Patient placed on heparin for anticoagulation. -Per cardiology.  4.  Acute diastolic CHF/pulmonary hypertension -Due to soft borderline blood pressures diuretics on hold. -Patient does not look volume overloaded on examination. -BNP noted to be elevated on presentation at 889 and trended down to 430. -2D echo with EF of 59%, moderately enlarged RV with mildly reduced systolic function at the base and mid segments with relatively preserved apical systolic function.- -CT angiogram chest negative for PE. -Urine output of 925 cc over the past 24 hours noted. -Current weight of 207.23 pounds.  Unsure of accuracy of weights. -Cardiology recommending repeat 2D echo once respiratory issues have resolved. -Per cardiology.  5.  Elevated troponin -Per cardiology likely secondary to demand ischemia. -Troponins mildly elevated peaked and  trended down. -EKG changes of T wave abnormality in anterior leads noted. -2D echo with  EF of 59%,  moderately enlarged RV with mildly reduced systolic function at the base and mid segments with relatively preserved apical systolic function.- -Per cardiology coronary calcification noted on CT scan and recommending outpatient Lexiscan versus coronary CTA once patient has recovered from acute illness. -Per cardiology.  6.  Gross hematuria -Seen in consultation by urology and patient underwent CBI. -Currently off CBI with resolution of hematuria. -On IV heparin, monitor.  7.  Yeast in urine -Status post Diflucan.  8.  Acute kidney injury on chronic kidney disease stage II -Resolved. -Avoid nephrotoxins, hypotension.  9.  Right middle lobe nodule -Repeat CT scan in 3 months.  10.  Hypokalemia -Replete.  11.  Pressure injury, poa Pressure Injury 10/15/20 Buttocks Left;Medial;Upper Stage 2 -  Partial thickness loss of dermis presenting as a shallow open injury with a red, pink wound bed without slough. pink, red, yellow (Active)  10/15/20 1119  Location: Buttocks  Location Orientation: Left;Medial;Upper  Staging: Stage 2 -  Partial thickness loss of dermis presenting as a shallow open injury with a red, pink wound bed without slough.  Wound Description (Comments): pink, red, yellow  Present on Admission: No     Pressure Injury 10/15/20 Buttocks Left;Medial;Lower Stage 2 -  Partial thickness loss of dermis presenting as a shallow open injury with a red, pink wound bed without slough. red, yellow, pink (Active)  10/15/20 1120  Location: Buttocks  Location Orientation: Left;Medial;Lower  Staging: Stage 2 -  Partial thickness loss of dermis presenting as a shallow open injury with a red, pink wound bed without slough.  Wound Description (Comments): red, yellow, pink  Present on Admission: No     Pressure Injury 10/15/20 Buttocks Right;Medial Stage 2 -  Partial thickness loss of dermis presenting as a shallow open injury with a red, pink wound bed without slough. pink, red, yellow  (Active)  10/15/20 1120  Location: Buttocks  Location Orientation: Right;Medial  Staging: Stage 2 -  Partial thickness loss of dermis presenting as a shallow open injury with a red, pink wound bed without slough.  Wound Description (Comments): pink, red, yellow  Present on Admission: No           DVT prophylaxis: Heparin Code Status: DNR Family Communication: Updated patient.  No family at bedside. Disposition:   Status is: Inpatient  Remains inpatient appropriate because:Inpatient level of care appropriate due to severity of illness  Dispo: The patient is from: Home              Anticipated d/c is to:  TBD              Patient currently is not medically stable to d/c.   Difficult to place patient No       Consultants:  Cardiology: Dr. Mayford Knife 09/24/2020 PCCM: Dr. Marchelle Gearing 09/23/2020 Urology: Dr. Alvester Morin 10/01/2020 -Wound care RN General surgery: Dr. Daphine Deutscher 10/07/2020 Gastroenterology: Dr. Levora Angel 10/10/2020 Palliative care: Dr. Neale Burly 19 2022       Procedures:  CT angiogram chest 09/22/2020, 09/24/2020 CT abdomen and pelvis 09/30/2020, 10/08/2020, 10/14/2020 Abdominal films 10/14/2020, 10/13/2020, 10/12/2020, 10/10/2020, 10/09/2020, 10/08/2020, 10/07/2020, 09/30/2020, Chest x-ray 09/25/2020, 09/26/2020, 09/27/2020, 09/29/2020, 10/04/2020, 10/10/2020 Abdominal ultrasound 09/23/2020, 2D echo 09/23/2020, 10/01/2020 Lower extremity Dopplers 09/24/2020  Antimicrobials:  Anti-infectives (From admission, onward)    Start     Dose/Rate Route Frequency Ordered Stop   10/14/20 0815  ceFEPIme (MAXIPIME) 2 g in  sodium chloride 0.9 % 100 mL IVPB        2 g 200 mL/hr over 30 Minutes Intravenous Every 8 hours 10/14/20 0718 10/16/20 2359   10/12/20 1200  ceFEPIme (MAXIPIME) 2 g in sodium chloride 0.9 % 100 mL IVPB  Status:  Discontinued        2 g 200 mL/hr over 30 Minutes Intravenous Every 12 hours 10/12/20 1133 10/14/20 0718   10/10/20 2000  ceFEPIme (MAXIPIME) 2 g in sodium chloride 0.9 % 100 mL  IVPB  Status:  Discontinued        2 g 200 mL/hr over 30 Minutes Intravenous Every 24 hours 10/10/20 1812 10/12/20 1133   10/10/20 1930  vancomycin (VANCOREADY) IVPB 1500 mg/300 mL        1,500 mg 150 mL/hr over 120 Minutes Intravenous  Once 10/10/20 1812 10/10/20 2045   10/10/20 1838  vancomycin variable dose per unstable renal function (pharmacist dosing)  Status:  Discontinued         Does not apply See admin instructions 10/10/20 1838 10/12/20 0834   10/09/20 1000  fluconazole (DIFLUCAN) tablet 200 mg        200 mg Per Tube Daily 10/09/20 0826 10/10/20 0903   10/08/20 1000  fluconazole (DIFLUCAN) tablet 200 mg  Status:  Discontinued        200 mg Oral Daily 10/07/20 1426 10/09/20 0826   10/03/20 1700  fluconazole (DIFLUCAN) tablet 200 mg  Status:  Discontinued        200 mg Oral Daily 10/03/20 1606 10/07/20 1426   09/23/20 1800  cefTRIAXone (ROCEPHIN) 2 g in sodium chloride 0.9 % 100 mL IVPB        2 g 200 mL/hr over 30 Minutes Intravenous Every 24 hours 09/22/20 1842 09/29/20 1749   09/23/20 1800  azithromycin (ZITHROMAX) 500 mg in sodium chloride 0.9 % 250 mL IVPB  Status:  Discontinued        500 mg 250 mL/hr over 60 Minutes Intravenous Every 24 hours 09/22/20 1842 09/25/20 1456   09/22/20 1630  cefTRIAXone (ROCEPHIN) 1 g in sodium chloride 0.9 % 100 mL IVPB        1 g 200 mL/hr over 30 Minutes Intravenous  Once 09/22/20 1618 09/22/20 1806   09/22/20 1630  azithromycin (ZITHROMAX) 500 mg in sodium chloride 0.9 % 250 mL IVPB        500 mg 250 mL/hr over 60 Minutes Intravenous  Once 09/22/20 1618 09/22/20 1939         Subjective: NG tube out. Sipping on clears. No CP. No Sig SOB. Abdominal disconfort. Large loose stool overnight.  Foley catheter with clear urine.  Objective: Vitals:   10/15/20 1642 10/15/20 1643 10/15/20 1644 10/15/20 1649  BP:      Pulse: (!) 102 (!) 119 (!) 124   Resp: (!) 27 (!) 26 19   Temp:    97.6 F (36.4 C)  TempSrc:    Oral  SpO2: 93% 92% 91%    Weight:      Height:        Intake/Output Summary (Last 24 hours) at 10/15/2020 1744 Last data filed at 10/15/2020 1642 Gross per 24 hour  Intake 2128.48 ml  Output 2400 ml  Net -271.52 ml   Filed Weights   10/14/20 1000 10/15/20 0259 10/15/20 1000  Weight: 87.3 kg 94.5 kg 94 kg    Examination:  General exam: Appears calm and comfortable  Respiratory system: Clear to auscultation anterior lung  fields. Respiratory effort normal.  No wheezes, no crackles, no rhonchi. Cardiovascular system: Irregularly irregular.  No JVD, no murmurs rubs or gallops.  No lower extremity edema Gastrointestinal system: Abdomen is soft, distended, hypoactive BS. NTTP.  Central nervous system: Alert and oriented. No focal neurological deficits. Extremities: Symmetric 5 x 5 power. Skin: No rashes, lesions or ulcers Psychiatry: Judgement and insight appear fair. Mood & affect appropriate.     Data Reviewed: I have personally reviewed following labs and imaging studies  CBC: Recent Labs  Lab 10/11/20 0742 10/12/20 0935 10/13/20 0245 10/14/20 0250 10/15/20 0314  WBC 14.8* 10.9* 8.0 6.9 6.0  NEUTROABS 13.3*  --   --   --   --   HGB 14.7 14.5 13.4 13.6 12.5  HCT 43.0 42.9 39.9 39.7 36.3  MCV 91.9 92.1 92.8 93.2 91.7  PLT 267 256 197 155 137*    Basic Metabolic Panel: Recent Labs  Lab 10/10/20 0233 10/11/20 0742 10/12/20 0935 10/13/20 0245 10/14/20 0250 10/15/20 0314  NA 133* 138 139 135 131* 131*  K 4.5 3.5 3.0* 3.4* 3.9 3.2*  CL 84* 92* 98 98 99 97*  CO2 31 32 29 26 23 24   GLUCOSE 135* 110* 112* 120* 120* 134*  BUN 110* 105* 63* 45* 32* 22  CREATININE 2.47* 1.69* 0.82 0.64 0.52 0.37*  CALCIUM 8.5* 8.2* 8.1* 7.8* 7.8* 7.9*  MG 3.3*  --   --  2.5*  --  2.0    GFR: Estimated Creatinine Clearance: 62.1 mL/min (A) (by C-G formula based on SCr of 0.37 mg/dL (L)).  Liver Function Tests: Recent Labs  Lab 10/09/20 0251 10/12/20 0935 10/13/20 0245 10/14/20 0250 10/15/20 0314   AST 47* 49* 45* 48* 44*  ALT 95* 92* 81* 80* 76*  ALKPHOS 78 65 58 57 51  BILITOT 1.8* 1.7* 1.3* 1.1 1.1  PROT 6.5 5.6* 5.3* 5.4* 5.3*  ALBUMIN 2.9* 2.3* 2.1* 2.2* 2.4*    CBG: Recent Labs  Lab 10/14/20 1559 10/14/20 2125 10/15/20 0737 10/15/20 1121 10/15/20 1647  GLUCAP 124* 107* 124* 120* 118*     No results found for this or any previous visit (from the past 240 hour(s)).       Radiology Studies: CT ABDOMEN PELVIS W CONTRAST  Result Date: 10/14/2020 CLINICAL DATA:  Bowel obstruction suspected. EXAM: CT ABDOMEN AND PELVIS WITH CONTRAST TECHNIQUE: Multidetector CT imaging of the abdomen and pelvis was performed using the standard protocol following bolus administration of intravenous contrast. CONTRAST:  62mL OMNIPAQUE IOHEXOL 350 MG/ML SOLN COMPARISON:  CT abdomen pelvis 10/08/2020, CT abdomen pelvis 09/30/2020 FINDINGS: Lower chest: Trace bilateral pleural effusions with bilateral lobe atelectasis. Right pleural base calcifications. Tiny hiatal hernia. Hepatobiliary: The hepatic parenchyma is diffusely hypodense compared to the splenic parenchyma consistent with fatty infiltration. No focal liver abnormality. Status post cholecystectomy. No biliary dilatation. Pancreas: No focal lesion. Normal pancreatic contour. No surrounding inflammatory changes. No main pancreatic ductal dilatation. Spleen: Normal in size without focal abnormality. Adrenals/Urinary Tract: No adrenal nodule bilaterally. Bilateral kidneys enhance symmetrically. Subcentimeter hypodensities are too small to characterize. No hydronephrosis. No hydroureter. The urinary bladder is fully decompressed with Foley balloon and tip terminating within its lumen. On delayed imaging, there is no urothelial wall thickening and there are no filling defects in the opacified portions of the bilateral collecting systems or ureters. Stomach/Bowel: Enteric tube with tip and side port terminating within the gastric lumen. PO contrast  is noted to reach the rectum. Stomach is within normal limits.  Redemonstration of several loops of proximal and mid small bowel dilatation with gas and fluid measuring up to 4.8 cm in caliber. There is a change in caliber of the small bowel within the mid abdomen in the setting of mesenteric swirling (5:46, 2:51) with associated distal small bowel that is fully collapsed but with its lumen filled with PO contrast. No pneumatosis. No evidence of large bowel wall thickening or dilatation. The rectum is under distended. Scattered sigmoid diverticulosis. The appendix not definitely identified. Vascular/Lymphatic: No abdominal aorta or iliac aneurysm. Severe atherosclerotic plaque of the aorta and its branches. No abdominal, pelvic, or inguinal lymphadenopathy. Reproductive: Status post hysterectomy. No adnexal masses. Other: Persistent trace free fluid along the pericolic gutters in the pelvis. No intraperitoneal free gas. No organized fluid collection. Musculoskeletal: No abdominal wall hernia or abnormality. No suspicious lytic or blastic osseous lesions. No acute displaced fracture. Multilevel degenerative changes of the spine. IMPRESSION: 1. Findings suggestive of a partial small bowel obstruction with associated mesenteric swirling within the mid abdomen. Differential diagnosis includes an ileus. Small bowel measures up to 4.8 cm in caliber with PO contrast reaching the rectum. 2. Nonspecific persistent trace free fluid. 3. Scattered sigmoid diverticulosis with no acute diverticulitis. 4. Hepatic steatosis. 5. Bilateral trace pleural effusions with passive atelectasis. Superimposed infection/inflammation not excluded. 6. Enteric tube in good position. 7.  Aortic Atherosclerosis (ICD10-I70.0). Electronically Signed   By: Tish Frederickson M.D.   On: 10/14/2020 16:27   DG Abd Portable 1V  Result Date: 10/14/2020 CLINICAL DATA:  Ileus. EXAM: PORTABLE ABDOMEN - 1 VIEW COMPARISON:  October 13, 2020. FINDINGS: Stable  small bowel dilatation is noted concerning for ileus or partial small bowel obstruction. Contrast is noted in the colon. Nasogastric tube tip is seen in stomach. IMPRESSION: Stable small bowel dilatation is noted concerning for ileus or partial small bowel obstruction. Electronically Signed   By: Lupita Raider M.D.   On: 10/14/2020 10:12        Scheduled Meds:  bisacodyl  10 mg Rectal BID   budesonide (PULMICORT) nebulizer solution  0.5 mg Nebulization BID   chlorhexidine  15 mL Mouth Rinse BID   Chlorhexidine Gluconate Cloth  6 each Topical Daily   insulin aspart  0-9 Units Subcutaneous TID WC   levalbuterol  1.25 mg Nebulization BID   liver oil-zinc oxide   Topical TID   mouth rinse  15 mL Mouth Rinse q12n4p   polyethylene glycol  17 g Per Tube TID   Continuous Infusions:  sodium chloride 10 mL/hr at 10/15/20 1642   amiodarone 30 mg/hr (10/15/20 1642)   ceFEPime (MAXIPIME) IV Stopped (10/15/20 1425)   dextrose 5 % and 0.45% NaCl 10 mL/hr at 10/15/20 1642   heparin 900 Units/hr (10/15/20 1642)     LOS: 23 days    Time spent: 40 minutes    Ramiro Harvest, MD [facility]   To contact the attending provider between 7A-7P or the covering provider during after hours 7P-7A, please log into the web site [email or web address] and access using universal [place] password for that web site. If you do not have the password, please call the hospital operator.  10/15/2020, 5:44 PM

## 2020-10-15 NOTE — Progress Notes (Addendum)
Physical Therapy Treatment Patient Details Name: Kimberly Ballard MRN: [id number] DOB: Jun 14, 1940 Today's Date: 10/15/2020   History of Present Illness Patient is a 80 year old female presented on 09/22/20 with shortness of breath, body aches, decreased appetite and coughing. Per DTR patient diagnosed with COVID week previous to admission. PMH includes COPD.  CT chest ruled out PE but shows evidence of bilateral infiltrates.  Patient was started on Rocephin and Zithromax for community-acquired pneumonia.  Subsequently she decompensated and worsened and went into A. fib with RVR and was placed on BiPAP and admitted in the stepdown.   Patient also developed hematuria, and developed ileus vs. PSBO.    PT Comments    Patient has had multiple medical issues preventing therapy. Patient has had a significant decline in function since  PT first evaluated. Patient was min assist, even  when on  HHFNC earlier. Today, patient unable to stand  after multiple attempts, using STEDY lift equipment. Currently, rely on mechanical  sky/maximove lift. Patient has very flat affect and minimal eye contact.  Continue PT for mobility as tolerated.  Recommendations for follow up therapy are one component of a multi-disciplinary discharge planning process, led by the attending physician.  Recommendations may be updated based on patient status, additional functional criteria and insurance authorization.  Follow Up Recommendations  SNF     Equipment Recommendations  None recommended by PT    Recommendations for Other Services       Precautions / Restrictions Precautions Precautions: Fall Precaution Comments: monitor vitals; currently on 4 L     Mobility  Bed Mobility Overal bed mobility: Needs Assistance Bed Mobility: Sit to Supine Rolling: Total assist;+2 for physical assistance   Supine to sit: HOB elevated;Total assist Sit to supine: Total assist;+2 for physical assistance;+2 for safety/equipment    General bed mobility comments: total assist to transfer to side of the bed needing assistance for lower legs, trunk negotiation and use of bed pad to pivot hips. Patient doesn't attempt to assist - patient profoundly weak    Transfers Overall transfer level: Needs assistance               General transfer comment: attempted to stand with walker and patient unable to power up despite physical asistance. attempted x 2 with stedy (total assist x 2) and use of elevated bed - patient unable to power up and unable to get patient in to standing  Ambulation/Gait                 Stairs             Wheelchair Mobility    Modified Rankin (Stroke Patients Only)       Balance Overall balance assessment: Needs assistance Sitting-balance support: No upper extremity supported;Feet supported Sitting balance-Leahy Scale: Fair     Standing balance support: Single extremity supported;Bilateral upper extremity supported Standing balance-Leahy Scale: Poor Standing balance comment: unable                            Cognition Arousal/Alertness: Awake/alert Behavior During Therapy: Flat affect Overall Cognitive Status: Within Functional Limits for tasks assessed                                 General Comments: minimal verbalizations, very flat, no eye contact      Exercises Other Exercises Other Exercises: encouarged upper and  lower extremity movement in bed    General Comments        Pertinent Vitals/Pain Pain Assessment: No/denies pain Faces Pain Scale: No hurt    Home Living                      Prior Function            PT Goals (current goals can now be found in the care plan section) Acute Rehab PT Goals Patient Stated Goal: get stronger to get out of bed Progress towards PT goals: Not progressing toward goals - comment (significant  functional decline since first seen by PT)    Frequency    Min 2X/week       PT Plan Current plan remains appropriate    Co-evaluation PT/OT/SLP Co-Evaluation/Treatment: Yes Reason for Co-Treatment: Complexity of the patient's impairments (multi-system involvement);For patient/therapist safety PT goals addressed during session: Mobility/safety with mobility OT goals addressed during session: ADL's and self-care      AM-PAC PT "6 Clicks" Mobility   Outcome Measure  Help needed turning from your back to your side while in a flat bed without using bedrails?: Total Help needed moving from lying on your back to sitting on the side of a flat bed without using bedrails?: Total Help needed moving to and from a bed to a chair (including a wheelchair)?: Total Help needed standing up from a chair using your arms (e.g., wheelchair or bedside chair)?: Total Help needed to walk in hospital room?: Total Help needed climbing 3-5 steps with a railing? : Total 6 Click Score: 6    End of Session Equipment Utilized During Treatment: Oxygen Activity Tolerance: Patient limited by fatigue Patient left: in bed;with call bell/phone within reach Nurse Communication: Mobility status;Need for lift equipment PT Visit Diagnosis: Muscle weakness (generalized) (M62.81)     Time: 0300-9233 PT Time Calculation (min) (ACUTE ONLY): 20 min  Charges:  $Therapeutic Activity: 8-22 mins                     Blanchard Kelch PT Acute Rehabilitation Services Pager [phone] Office [phone]    [name] 10/15/2020, 2:55 PM

## 2020-10-15 NOTE — Progress Notes (Addendum)
Subjective: As per patient's nurse, overnight she had a large,loose liquid bowel movement. She has 1000 mL of bilious fluid in suction canister via NG tube.   Objective: Vital signs in last 24 hours: Temp:  [97.4 F (36.3 C)-97.8 F (36.6 C)] 97.5 F (36.4 C) (09/14 0329) Pulse Rate:  [46-135] 119 (09/14 0700) Resp:  [13-28] 17 (09/14 0700) BP: (81-120)/(50-85) 91/67 (09/14 0700) SpO2:  [91 %-100 %] 96 % (09/14 0727) FiO2 (%):  [50 %] 50 % (09/14 0727) Weight:  [87.3 kg-94.5 kg] 94.5 kg (09/14 0259) Weight change: -3.6 kg Last BM Date: 10/14/20  PE: Ill-appearing, no pallor no icterus GENERAL: NG tube draining bilious fluid  ABDOMEN: Distended but slightly softer than yesterday, bowel sounds not audible EXTREMITIES: No deformity, no edema  Lab Results: Results for orders placed or performed during the hospital encounter of 09/22/20 (from the past 48 hour(s))  Glucose, capillary     Status: Abnormal   Collection Time: 10/13/20  7:48 AM  Result Value Ref Range   Glucose-Capillary 124 (H) 70 - 99 mg/dL    Comment: Glucose reference range applies only to samples taken after fasting for at least 8 hours.   Comment 1 Notify RN    Comment 2 Document in Chart   Glucose, capillary     Status: Abnormal   Collection Time: 10/13/20 11:52 AM  Result Value Ref Range   Glucose-Capillary 124 (H) 70 - 99 mg/dL    Comment: Glucose reference range applies only to samples taken after fasting for at least 8 hours.   Comment 1 Notify RN    Comment 2 Document in Chart   Glucose, capillary     Status: Abnormal   Collection Time: 10/13/20  5:42 PM  Result Value Ref Range   Glucose-Capillary 127 (H) 70 - 99 mg/dL    Comment: Glucose reference range applies only to samples taken after fasting for at least 8 hours.   Comment 1 Notify RN    Comment 2 Document in Chart   Heparin level (unfractionated)     Status: Abnormal   Collection Time: 10/13/20  7:15 PM  Result Value Ref Range   Heparin  Unfractionated 1.10 (H) 0.30 - 0.70 IU/mL    Comment: (NOTE) The clinical reportable range upper limit is being lowered to >1.10 to align with the FDA approved guidance for the current laboratory assay.  If heparin results are below expected values, and patient dosage has  been confirmed, suggest follow up testing of antithrombin III levels. Performed at [facility], [address] [place], [place] [address]   Glucose, capillary     Status: Abnormal   Collection Time: 10/13/20  9:36 PM  Result Value Ref Range   Glucose-Capillary 108 (H) 70 - 99 mg/dL    Comment: Glucose reference range applies only to samples taken after fasting for at least 8 hours.  Heparin level (unfractionated)     Status: Abnormal   Collection Time: 10/13/20 11:46 PM  Result Value Ref Range   Heparin Unfractionated >1.10 (H) 0.30 - 0.70 IU/mL    Comment: (NOTE) The clinical reportable range upper limit is being lowered to >1.10 to align with the FDA approved guidance for the current laboratory assay.  If heparin results are below expected values, and patient dosage has  been confirmed, suggest follow up testing of antithrombin III levels. Performed at [facility], [address] [place], [place] [address]   Comprehensive metabolic panel  Status: Abnormal   Collection Time: 10/14/20  2:50 AM  Result Value Ref Range   Sodium 131 (L) 135 - 145 mmol/L   Potassium 3.9 3.5 - 5.1 mmol/L   Chloride 99 98 - 111 mmol/L   CO2 23 22 - 32 mmol/L   Glucose, Bld 120 (H) 70 - 99 mg/dL    Comment: Glucose reference range applies only to samples taken after fasting for at least 8 hours.   BUN 32 (H) 8 - 23 mg/dL   Creatinine, Ser 3.81 0.44 - 1.00 mg/dL   Calcium 7.8 (L) 8.9 - 10.3 mg/dL   Total Protein 5.4 (L) 6.5 - 8.1 g/dL   Albumin 2.2 (L) 3.5 - 5.0 g/dL   AST 48 (H) 15 - 41 U/L   ALT 80 (H) 0 - 44 U/L   Alkaline Phosphatase 57 38 - 126 U/L   Total Bilirubin 1.1 0.3 -  1.2 mg/dL   GFR, Estimated >01 >75 mL/min    Comment: (NOTE) Calculated using the CKD-EPI Creatinine Equation (2021)    Anion gap 9 5 - 15    Comment: Performed at [facility], [address], [place], [place] [address]  CBC     Status: None   Collection Time: 10/14/20  2:50 AM  Result Value Ref Range   WBC 6.9 4.0 - 10.5 K/uL   RBC 4.26 3.87 - 5.11 MIL/uL   Hemoglobin 13.6 12.0 - 15.0 g/dL   HCT 52.7 78.2 - 42.3 %   MCV 93.2 80.0 - 100.0 fL   MCH 31.9 26.0 - 34.0 pg   MCHC 34.3 30.0 - 36.0 g/dL   RDW 53.6 14.4 - 31.5 %   Platelets 155 150 - 400 K/uL   nRBC 0.0 0.0 - 0.2 %    Comment: Performed at [facility], [address], [place], [place] [address]  Glucose, capillary     Status: Abnormal   Collection Time: 10/14/20  7:58 AM  Result Value Ref Range   Glucose-Capillary 128 (H) 70 - 99 mg/dL    Comment: Glucose reference range applies only to samples taken after fasting for at least 8 hours.   Comment 1 Notify RN    Comment 2 Document in Chart   Heparin level (unfractionated)     Status: Abnormal   Collection Time: 10/14/20  9:33 AM  Result Value Ref Range   Heparin Unfractionated 0.90 (H) 0.30 - 0.70 IU/mL    Comment: (NOTE) The clinical reportable range upper limit is being lowered to >1.10 to align with the FDA approved guidance for the current laboratory assay.  If heparin results are below expected values, and patient dosage has  been confirmed, suggest follow up testing of antithrombin III levels. Performed at [facility], [address] [place], [place] [address]   Glucose, capillary     Status: Abnormal   Collection Time: 10/14/20 12:06 PM  Result Value Ref Range   Glucose-Capillary 118 (H) 70 - 99 mg/dL    Comment: Glucose reference range applies only to samples taken after fasting for at least 8 hours.   Comment 1 Notify RN    Comment 2 Document in Chart   Glucose, capillary     Status: Abnormal    Collection Time: 10/14/20  3:59 PM  Result Value Ref Range   Glucose-Capillary 124 (H) 70 - 99 mg/dL    Comment: Glucose reference range applies only to samples taken after fasting for at least 8  hours.   Comment 1 Notify RN    Comment 2 Document in Chart   Heparin level (unfractionated)     Status: None   Collection Time: 10/14/20  6:41 PM  Result Value Ref Range   Heparin Unfractionated 0.65 0.30 - 0.70 IU/mL    Comment: (NOTE) The clinical reportable range upper limit is being lowered to >1.10 to align with the FDA approved guidance for the current laboratory assay.  If heparin results are below expected values, and patient dosage has  been confirmed, suggest follow up testing of antithrombin III levels. Performed at [facility], [address], [place], [place] [address]   Glucose, capillary     Status: Abnormal   Collection Time: 10/14/20  9:25 PM  Result Value Ref Range   Glucose-Capillary 107 (H) 70 - 99 mg/dL    Comment: Glucose reference range applies only to samples taken after fasting for at least 8 hours.  Comprehensive metabolic panel     Status: Abnormal   Collection Time: 10/15/20  3:14 AM  Result Value Ref Range   Sodium 131 (L) 135 - 145 mmol/L   Potassium 3.2 (L) 3.5 - 5.1 mmol/L    Comment: DELTA CHECK NOTED   Chloride 97 (L) 98 - 111 mmol/L   CO2 24 22 - 32 mmol/L   Glucose, Bld 134 (H) 70 - 99 mg/dL    Comment: Glucose reference range applies only to samples taken after fasting for at least 8 hours.   BUN 22 8 - 23 mg/dL   Creatinine, Ser 2.95 (L) 0.44 - 1.00 mg/dL   Calcium 7.9 (L) 8.9 - 10.3 mg/dL   Total Protein 5.3 (L) 6.5 - 8.1 g/dL   Albumin 2.4 (L) 3.5 - 5.0 g/dL   AST 44 (H) 15 - 41 U/L   ALT 76 (H) 0 - 44 U/L   Alkaline Phosphatase 51 38 - 126 U/L   Total Bilirubin 1.1 0.3 - 1.2 mg/dL   GFR, Estimated >62 >13 mL/min    Comment: (NOTE) Calculated using the CKD-EPI Creatinine Equation (2021)    Anion gap 10 5 - 15     Comment: Performed at [facility], [address] [place], [place] [address]  CBC     Status: Abnormal   Collection Time: 10/15/20  3:14 AM  Result Value Ref Range   WBC 6.0 4.0 - 10.5 K/uL   RBC 3.96 3.87 - 5.11 MIL/uL   Hemoglobin 12.5 12.0 - 15.0 g/dL   HCT 84.6 96.2 - 95.2 %   MCV 91.7 80.0 - 100.0 fL   MCH 31.6 26.0 - 34.0 pg   MCHC 34.4 30.0 - 36.0 g/dL   RDW 84.1 32.4 - 40.1 %   Platelets 137 (L) 150 - 400 K/uL    Comment: SPECIMEN CHECKED FOR CLOTS CONSISTENT WITH PREVIOUS RESULT REPEATED TO VERIFY    nRBC 0.0 0.0 - 0.2 %    Comment: Performed at [facility], [address], [place], [place] [address]  Heparin level (unfractionated)     Status: None   Collection Time: 10/15/20  3:14 AM  Result Value Ref Range   Heparin Unfractionated 0.55 0.30 - 0.70 IU/mL    Comment: (NOTE) The clinical reportable range upper limit is being lowered to >1.10 to align with the FDA approved guidance for the current laboratory assay.  If heparin results are below expected values, and patient dosage has  been confirmed, suggest follow up testing of  antithrombin III levels. Performed at [facility], [address], [place], [place] [address]   Glucose, capillary     Status: Abnormal   Collection Time: 10/15/20  7:37 AM  Result Value Ref Range   Glucose-Capillary 124 (H) 70 - 99 mg/dL    Comment: Glucose reference range applies only to samples taken after fasting for at least 8 hours.    Studies/Results: CT ABDOMEN PELVIS W CONTRAST  Result Date: 10/14/2020 CLINICAL DATA:  Bowel obstruction suspected. EXAM: CT ABDOMEN AND PELVIS WITH CONTRAST TECHNIQUE: Multidetector CT imaging of the abdomen and pelvis was performed using the standard protocol following bolus administration of intravenous contrast. CONTRAST:  33mL OMNIPAQUE IOHEXOL 350 MG/ML SOLN COMPARISON:  CT abdomen pelvis 10/08/2020, CT abdomen pelvis 09/30/2020 FINDINGS:  Lower chest: Trace bilateral pleural effusions with bilateral lobe atelectasis. Right pleural base calcifications. Tiny hiatal hernia. Hepatobiliary: The hepatic parenchyma is diffusely hypodense compared to the splenic parenchyma consistent with fatty infiltration. No focal liver abnormality. Status post cholecystectomy. No biliary dilatation. Pancreas: No focal lesion. Normal pancreatic contour. No surrounding inflammatory changes. No main pancreatic ductal dilatation. Spleen: Normal in size without focal abnormality. Adrenals/Urinary Tract: No adrenal nodule bilaterally. Bilateral kidneys enhance symmetrically. Subcentimeter hypodensities are too small to characterize. No hydronephrosis. No hydroureter. The urinary bladder is fully decompressed with Foley balloon and tip terminating within its lumen. On delayed imaging, there is no urothelial wall thickening and there are no filling defects in the opacified portions of the bilateral collecting systems or ureters. Stomach/Bowel: Enteric tube with tip and side port terminating within the gastric lumen. PO contrast is noted to reach the rectum. Stomach is within normal limits. Redemonstration of several loops of proximal and mid small bowel dilatation with gas and fluid measuring up to 4.8 cm in caliber. There is a change in caliber of the small bowel within the mid abdomen in the setting of mesenteric swirling (5:46, 2:51) with associated distal small bowel that is fully collapsed but with its lumen filled with PO contrast. No pneumatosis. No evidence of large bowel wall thickening or dilatation. The rectum is under distended. Scattered sigmoid diverticulosis. The appendix not definitely identified. Vascular/Lymphatic: No abdominal aorta or iliac aneurysm. Severe atherosclerotic plaque of the aorta and its branches. No abdominal, pelvic, or inguinal lymphadenopathy. Reproductive: Status post hysterectomy. No adnexal masses. Other: Persistent trace free fluid along  the pericolic gutters in the pelvis. No intraperitoneal free gas. No organized fluid collection. Musculoskeletal: No abdominal wall hernia or abnormality. No suspicious lytic or blastic osseous lesions. No acute displaced fracture. Multilevel degenerative changes of the spine. IMPRESSION: 1. Findings suggestive of a partial small bowel obstruction with associated mesenteric swirling within the mid abdomen. Differential diagnosis includes an ileus. Small bowel measures up to 4.8 cm in caliber with PO contrast reaching the rectum. 2. Nonspecific persistent trace free fluid. 3. Scattered sigmoid diverticulosis with no acute diverticulitis. 4. Hepatic steatosis. 5. Bilateral trace pleural effusions with passive atelectasis. Superimposed infection/inflammation not excluded. 6. Enteric tube in good position. 7.  Aortic Atherosclerosis (ICD10-I70.0). Electronically Signed   By: Tish Frederickson M.D.   On: 10/14/2020 16:27   DG Abd Portable 1V  Result Date: 10/14/2020 CLINICAL DATA:  Ileus. EXAM: PORTABLE ABDOMEN - 1 VIEW COMPARISON:  October 13, 2020. FINDINGS: Stable small bowel dilatation is noted concerning for ileus or partial small bowel obstruction. Contrast is noted in the colon. Nasogastric tube tip is seen in stomach. IMPRESSION: Stable small bowel dilatation is noted concerning  for ileus or partial small bowel obstruction. Electronically Signed   By: Lupita Raider M.D.   On: 10/14/2020 10:12   DG Abd Portable 1V  Result Date: 10/13/2020 CLINICAL DATA:  Small-bowel obstruction EXAM: PORTABLE ABDOMEN - 1 VIEW COMPARISON:  10/12/2020 FINDINGS: Multiple gas-filled dilated loops of small bowel are again seen within the mid abdomen in keeping with a mid to distal small bowel obstruction. Contrast from prior CT examination is again seen within several decompressed loops of distal small bowel as well as throughout the nondistended colon. Nasogastric tube tip is seen overlying the a gastric bubble within the  left upper quadrant the abdomen. Left flank excluded from view. No gross free intraperitoneal gas. IMPRESSION: Persistent findings of high-grade partial or developing complete mid to distal small bowel obstruction. Electronically Signed   By: Helyn Numbers M.D.   On: 10/13/2020 11:37    Medications: I have reviewed the patient's current medications.  Assessment: CAT scan showed partial small bowel obstruction with associated mesenteric swelling within mid abdomen, small bowel measures up to 4.8 cm in diameter with p.o. contrast reaching rectum  Scattered sigmoid diverticulosis, no evidence of large bowel wall thickening or dilation, under distended rectum  Hypokalemia, potassium 3.2  Plan: Surgical team on board Ongoing conservative management with NG tube decompression, MiraLAX via NG tube and Dulcolax suppository No significant change in clinical condition. Guarded prognosis.   Kerin Salen, MD 10/15/2020, 7:42 AM

## 2020-10-15 NOTE — Evaluation (Signed)
SLP Cancellation Note  Patient Details Name: Kimberly Ballard MRN: [id number] DOB: 07/07/1940   Cancelled treatment:       Reason Eval/Treat Not Completed: Other (comment) (pt having "sips of clears", per RN tolerating, will continue effort for swallow eval hopefully as pt able to have more po)  [name], MS [facility] SLP Acute Rehab Services Office [phone] Pager [phone]  Chales Abrahams 10/15/2020, 6:15 PM

## 2020-10-16 ENCOUNTER — Inpatient Hospital Stay (HOSPITAL_COMMUNITY): Payer: Medicare HMO

## 2020-10-16 ENCOUNTER — Inpatient Hospital Stay: Payer: Self-pay

## 2020-10-16 DIAGNOSIS — R778 Other specified abnormalities of plasma proteins: Secondary | ICD-10-CM | POA: Diagnosis not present

## 2020-10-16 DIAGNOSIS — I517 Cardiomegaly: Secondary | ICD-10-CM | POA: Diagnosis not present

## 2020-10-16 DIAGNOSIS — I4891 Unspecified atrial fibrillation: Secondary | ICD-10-CM | POA: Diagnosis not present

## 2020-10-16 DIAGNOSIS — Z7189 Other specified counseling: Secondary | ICD-10-CM | POA: Diagnosis not present

## 2020-10-16 DIAGNOSIS — K5900 Constipation, unspecified: Secondary | ICD-10-CM | POA: Diagnosis not present

## 2020-10-16 DIAGNOSIS — J189 Pneumonia, unspecified organism: Secondary | ICD-10-CM | POA: Diagnosis not present

## 2020-10-16 DIAGNOSIS — Z515 Encounter for palliative care: Secondary | ICD-10-CM | POA: Diagnosis not present

## 2020-10-16 DIAGNOSIS — J9602 Acute respiratory failure with hypercapnia: Secondary | ICD-10-CM | POA: Diagnosis not present

## 2020-10-16 DIAGNOSIS — R531 Weakness: Secondary | ICD-10-CM | POA: Diagnosis not present

## 2020-10-16 DIAGNOSIS — J9601 Acute respiratory failure with hypoxia: Secondary | ICD-10-CM | POA: Diagnosis not present

## 2020-10-16 LAB — CBC
HCT: 38.9 % (ref 36.0–46.0)
Hemoglobin: 13.1 g/dL (ref 12.0–15.0)
MCH: 30.9 pg (ref 26.0–34.0)
MCHC: 33.7 g/dL (ref 30.0–36.0)
MCV: 91.7 fL (ref 80.0–100.0)
Platelets: 117 10*3/uL — ABNORMAL LOW (ref 150–400)
RBC: 4.24 MIL/uL (ref 3.87–5.11)
RDW: 14.2 % (ref 11.5–15.5)
WBC: 6.4 10*3/uL (ref 4.0–10.5)
nRBC: 0 % (ref 0.0–0.2)

## 2020-10-16 LAB — COMPREHENSIVE METABOLIC PANEL
ALT: 90 U/L — ABNORMAL HIGH (ref 0–44)
AST: 57 U/L — ABNORMAL HIGH (ref 15–41)
Albumin: 2.4 g/dL — ABNORMAL LOW (ref 3.5–5.0)
Alkaline Phosphatase: 63 U/L (ref 38–126)
Anion gap: 6 (ref 5–15)
BUN: 19 mg/dL (ref 8–23)
CO2: 24 mmol/L (ref 22–32)
Calcium: 7.7 mg/dL — ABNORMAL LOW (ref 8.9–10.3)
Chloride: 100 mmol/L (ref 98–111)
Creatinine, Ser: 0.44 mg/dL (ref 0.44–1.00)
GFR, Estimated: >60 mL/min (ref >60)
Glucose, Bld: 112 mg/dL — ABNORMAL HIGH (ref 70–99)
Potassium: 4 mmol/L (ref 3.5–5.1)
Sodium: 130 mmol/L — ABNORMAL LOW (ref 135–145)
Total Bilirubin: 1.2 mg/dL (ref 0.3–1.2)
Total Protein: 5.3 g/dL — ABNORMAL LOW (ref 6.5–8.1)

## 2020-10-16 LAB — GLUCOSE, CAPILLARY
Glucose-Capillary: 102 mg/dL — ABNORMAL HIGH (ref 70–99)
Glucose-Capillary: 105 mg/dL — ABNORMAL HIGH (ref 70–99)
Glucose-Capillary: 98 mg/dL (ref 70–99)
Glucose-Capillary: 94 mg/dL (ref 70–99)

## 2020-10-16 LAB — MAGNESIUM: Magnesium: 2 mg/dL (ref 1.7–2.4)

## 2020-10-16 LAB — HEPARIN LEVEL (UNFRACTIONATED): Heparin Unfractionated: 0.49 IU/mL (ref 0.30–0.70)

## 2020-10-16 MED ORDER — ALBUMIN HUMAN 5 % IV SOLN
25.0000 g | Freq: Once | INTRAVENOUS | Status: AC
  Administered 2020-10-16: 25 g via INTRAVENOUS
  Filled 2020-10-16: qty 500

## 2020-10-16 MED ORDER — SODIUM CHLORIDE 0.9 % IV SOLN: INTRAVENOUS | Status: DC

## 2020-10-16 MED ORDER — PANTOPRAZOLE SODIUM 40 MG IV SOLR
40.0000 mg | Freq: Two times a day (BID) | INTRAVENOUS | Status: DC
  Administered 2020-10-16 – 2020-10-18 (×5): 40 mg via INTRAVENOUS
  Filled 2020-10-16 (×5): qty 40

## 2020-10-16 MED ORDER — DIGOXIN 0.25 MG/ML IJ SOLN
0.1250 mg | Freq: Once | INTRAMUSCULAR | Status: AC
  Administered 2020-10-16: 0.125 mg via INTRAVENOUS
  Filled 2020-10-16: qty 2

## 2020-10-16 MED ORDER — ONDANSETRON HCL 4 MG/2ML IJ SOLN: 4.0000 mg | Freq: Four times a day (QID) | INTRAMUSCULAR | Status: DC | PRN

## 2020-10-16 MED ORDER — SODIUM CHLORIDE 0.9 % IV BOLUS
500.0000 mL | Freq: Once | INTRAVENOUS | Status: AC
  Administered 2020-10-16: 500 mL via INTRAVENOUS

## 2020-10-16 NOTE — Progress Notes (Signed)
Chaplain assisted patient in getting her Advance Directive notarized.  Chaplain also inquired about how patient is doing, but patient declined talking further at this time.  She is very tired this afternoon and is still processing the information that she received today.  Chaplain Dyanne Carrel, Bcc Pager, [phone] 4:17 PM

## 2020-10-16 NOTE — Progress Notes (Signed)
Advanced directives documents placed in patients chart, awaiting further action from chaplain at this time.

## 2020-10-16 NOTE — Progress Notes (Signed)
Daily Progress Note   Patient Name: Kimberly Ballard       Date: 10/16/2020 DOB: 1940/02/05  Age: 80 y.o. MRN#: [id number] Attending Physician: Rodolph Bong, MD Primary Care Physician: System, Provider Not In Admit Date: 09/22/2020  Reason for Consultation/Follow-up: Establishing goals of care  Subjective: Kimberly Ballard is lying in bed in no distress, her daughter Kimberly Ballard has arrived at the bedside, we reviewed about the patient's condition, a goals of care meeting was done to determine patient's wishes and values going forward.   See below.     Length of Stay: 24  Current Medications: Scheduled Meds:   bisacodyl  10 mg Rectal BID   budesonide (PULMICORT) nebulizer solution  0.5 mg Nebulization BID   chlorhexidine  15 mL Mouth Rinse BID   Chlorhexidine Gluconate Cloth  6 each Topical Daily   insulin aspart  0-9 Units Subcutaneous TID WC   levalbuterol  1.25 mg Nebulization BID   liver oil-zinc oxide   Topical TID   mouth rinse  15 mL Mouth Rinse q12n4p   pantoprazole (PROTONIX) IV  40 mg Intravenous Q12H   polyethylene glycol  17 g Per Tube TID    Continuous Infusions:  sodium chloride Stopped (10/15/20 2047)   sodium chloride 75 mL/hr at 10/16/20 1152   amiodarone 30 mg/hr (10/16/20 1152)   ceFEPime (MAXIPIME) IV Stopped (10/16/20 0612)    PRN Meds: sodium chloride, ondansetron (ZOFRAN) IV, oxybutynin, simethicone, sodium chloride  Physical Exam Skin:    Findings: Rash present.           General: Alert, awake,     Heart: Tachycardic.   Lungs: Decreased air movement,   Abdomen: Soft, nontender, nondistended, positive bowel sounds.   Ext: +edema Skin: Warm and dry Neuro: Grossly intact, nonfocal. O2 via [place] currently.   Vital Signs: BP (!) 109/59   Pulse (!) 118    Temp (!) 97.5 F (36.4 C) (Oral)   Resp (!) 26   Ht 5\' 3"  (1.6 m)   Wt 94.1 kg   SpO2 95%   BMI 36.75 kg/m  SpO2: SpO2: 95 % O2 Device: O2 Device: High Flow Nasal Cannula O2 Flow Rate: O2 Flow Rate (L/min): 5 L/min (decreased to 4 LPM MD goal >=85% (placed on standard [place]))  Intake/output summary:  Intake/Output Summary (Last  24 hours) at 10/16/2020 1228 Last data filed at 10/16/2020 1152 Gross per 24 hour  Intake 2718.6 ml  Output 850 ml  Net 1868.6 ml    LBM: Last BM Date: 10/16/20 Baseline Weight: Weight: 90.9 kg Most recent weight: Weight: 94.1 kg       Palliative Assessment/Data:    Flowsheet Rows    Flowsheet Row Most Recent Value  Intake Tab   Referral Department Hospitalist  Unit at Time of Referral ICU  Palliative Care Primary Diagnosis Sepsis/Infectious Disease  Date Notified 10/09/20  Palliative Care Type New Palliative care  Reason for referral Clarify Goals of Care  Date of Admission 09/22/20  Date first seen by Palliative Care 10/11/20  # of days Palliative referral response time 2 Day(s)  # of days IP prior to Palliative referral 17  Clinical Assessment   Palliative Performance Scale Score 30%  Psychosocial & Spiritual Assessment   Palliative Care Outcomes   Patient/Family meeting held? Yes  [name]       Patient Active Problem List   Diagnosis Date Noted   Pressure injury of skin 10/15/2020   Constipation    SBO (small bowel obstruction) (HCC)    Palliative care by specialist    Goals of care, counseling/discussion    General weakness    Chest pain    Shock circulatory (HCC)    Hypoxemia    Ileus (HCC)    RVF (right ventricular failure) (HCC)    Dyspnea    Acute and chronic respiratory failure (acute-on-chronic) (HCC)    Atrial fibrillation with RVR (HCC)    Elevated troponin 09/23/2020   CAP (community acquired pneumonia) 09/22/2020   Acute respiratory failure with hypoxia and hypercapnia (HCC) 09/22/2020    Septic shock (HCC) 09/22/2020   Elevated LFTs 09/22/2020   Cardiomegaly 09/22/2020    Palliative Care Assessment & Plan   Patient Profile: 80 y.o. female  with past medical history of COPD, A. fib on Eliquis, and former tobacco use admitted on 09/22/2020 with dyspnea, aches and cough after COVID exposure.  She has had a prolonged hospital course after developing progressive respiratory failure from which she has become high flow nasal cannula dependent.  She has been treated for multifocal pneumonia.  She is also intermittently requiring BiPAP.  She has developed generalized ileus.  She has diastolic heart failure and atrial fibrillation on amiodarone and diltiazem.  She has been hypotensive on IV pressors.  She has AKI which is improving.  Unfortunately, she is not making significant gains or improvement.  Palliative consulted for goals of care.  Recommendations/Plan: -Agree with DNR/DNI -Patient with an ongoing escalating symptom burden such as abdominal discomfort, shortness of breath sensation and with recent large bout of coffee-ground emesis earlier today. Abdominal imaging with CT results reviewed again: CT 9/13 with PSBO vs ileus, some mesenteric swirling noted in mid abdomen but contrast reaches to level of rectum which would argue against SBO.   Patient makes her wishes known clearly that she does not want surgery. She does not want intubation and mechanical ventilation.   She was in the process of completing her living will and other documents at her lawyer's office when she had to get admitted to the hospital. She does elect her daughter Kimberly Ballard to be her HCPOA agent. She has 3 children, she has 2 sons. Her husband died couple of months ago, at home, with hospice support.   We will request chaplain consult to complete HCPOA documents.  Today, we discussed about what comfort-focused care will look like. We talked about adequate pain and non pain symptom management, judicious use of  opioids and benzodiazepines, concept of comfort feeds: small sips/bites of clear liquids and then consideration for transfer to residential hospice was explored.   Patient and daughter ask for input from all members of her care team, to make sure that all non surgical options that could improve her condition have been explored. No further escalation of care.They will discuss further about comfort-care and they will let nursing know. PMT to follow.   PMT to follow up with daughter on 10-15-20. Continue current mode of care, discussed with Dr Benjamine Mola [facility] who has discussed with the patient's daughter regarding her mother's current condition.     Goals of Care and Additional Recommendations: Limitations on Scope of Treatment: moving towards comfort care.   Code Status:    Code Status Orders  (From admission, onward)           Start     Ordered   09/27/20 0929  Do not attempt resuscitation (DNR)  Continuous        09/27/20 0928           Code Status History     Date Active Date Inactive Code Status Order ID Comments User Context   09/22/2020 2144 09/27/2020 0928 Full Code [id number]  Therisa Doyne, MD Inpatient       Prognosis:  guarded  Discharge Planning: To Be Determined:likely headed towards residential hospice, depending on hospital course.  Care plan was discussed with patient daughter and inter disciplinary team.    Thank you for allowing the Palliative Medicine Team to assist in the care of this patient.   Total Time 40 Prolonged Time Billed  no       Greater than 50%  of this time was spent counseling and coordinating care related to the above assessment and plan.  Rosalin Hawking, MD  Please contact Palliative Medicine Team phone at [phone] for questions and concerns from 7 AM to 7 PM. After 7 PM, please call primary service.

## 2020-10-16 NOTE — Progress Notes (Signed)
ANTICOAGULATION CONSULT NOTE   Pharmacy Consult for heparin Indication: atrial fibrillation  No Known Allergies  Patient Measurements: Height: 5\' 3"  (160 cm) Weight: 94.1 kg (207 lb 7.3 oz) IBW/kg (Calculated) : 52.4 Heparin Dosing Weight: 84.6 kg  Vital Signs: Temp: 97.5 F (36.4 C) (09/15 0803) Temp Source: Oral (09/15 0803) BP: 95/71 (09/15 0600) Pulse Rate: 101 (09/15 0600)  Labs: Recent Labs    10/14/20 0250 10/14/20 0933 10/14/20 1841 10/15/20 0314 10/16/20 0408  HGB 13.6  --   --  12.5 13.1  HCT 39.7  --   --  36.3 38.9  PLT 155  --   --  137* 117*  HEPARINUNFRC  --    < > 0.65 0.55 0.49  CREATININE 0.52  --   --  0.37* 0.44   < > = values in this interval not displayed.   Estimated Creatinine Clearance: 62.2 mL/min (by C-G formula based on SCr of 0.44 mg/dL).  Medical History: Past Medical History:  Diagnosis Date   COPD (chronic obstructive pulmonary disease) (HCC)    Medications:  Scheduled:   bisacodyl  10 mg Rectal BID   budesonide (PULMICORT) nebulizer solution  0.5 mg Nebulization BID   chlorhexidine  15 mL Mouth Rinse BID   Chlorhexidine Gluconate Cloth  6 each Topical Daily   insulin aspart  0-9 Units Subcutaneous TID WC   levalbuterol  1.25 mg Nebulization BID   liver oil-zinc oxide   Topical TID   mouth rinse  15 mL Mouth Rinse q12n4p   pantoprazole (PROTONIX) IV  40 mg Intravenous Q12H   polyethylene glycol  17 g Per Tube TID   Infusions:   sodium chloride Stopped (10/15/20 2047)   sodium chloride 100 mL/hr at 10/16/20 0809   amiodarone 30 mg/hr (10/16/20 0556)   ceFEPime (MAXIPIME) IV Stopped (10/16/20 0612)   heparin 900 Units/hr (10/16/20 0556)   Assessment: 80 yo female with pertinent PMH including atrial fibrillation found to have ileus.  Calculated CHADs-VASc = 3.  Patient on apixaban PTA however was held on 9/2 due to hematuria.  CCS following and says no emergent surgical intervention.  Patient is likely a poor surgical  candidate overall.   Cardiology recommends holding DOAC for now in case patient does require surgical intervention of ileus.  Received heparin 5000 units subq 9/12 @ 0522.  Last dose apixaban 9/2.  Pharmacy consulted to dose IV heparin now that hematuria has resolved.    Platelets have been slowly downtrending throughout admit prior to heparin being initiated on 9/12.  4T score ~2-3 indicating low probability of HIT.  Hemoglobin stable. Night shift RN reports large amount of coffee ground emesis early this AM.  No reports thus far of this occurring on day shift.  Reached out to primary team and CCS for discussion regarding holding heparin drip vs continue.   Goal of Therapy:  Heparin level 0.3-0.7 units/ml Monitor platelets by anticoagulation protocol: Yes   10/16/20 Daily HL therapeutic at 0.48 with heparin at 900 units/hr Coffee ground emesis overnight, none so far this AM. (See above)  Plan:  HOLD heparin drip due to coffee ground emesis Monitor daily CBC, s/sx bleeding F/u ability to restart heparin or transition back to DOAC  10/18/20, PharmD 10/16/2020 8:57 AM

## 2020-10-16 NOTE — Progress Notes (Signed)
PROGRESS NOTE    Kimberly Ballard  XBJ:[id number] DOB: 14-Feb-1940 DOA: 09/22/2020 PCP: System, Provider Not In    Chief Complaint  Patient presents with   Shortness of Breath    Brief Narrative:  Kimberly Ballard is an 80 y.o. female with a history of COPD presented with shortness of breath.  hypoxic respiratory failure.  Reported COVID exposure but tested negative.  CTA chest ruled out pulmonary embolism but showed bilateral infiltrates, hospital course complicated by multiple issues including AKI, hematuria, a fib with RVR and ileus/obstruction. Multiple subspecialty have been following her, she does not appear to make any progress, palliative care consulted as well for goals of care discussion.  Plan for 9/13: repeat CT Scan.       Assessment & Plan:   Principal Problem:   Acute respiratory failure with hypoxia and hypercapnia (HCC) Active Problems:   Atrial fibrillation with RVR (HCC)   Ileus (HCC)   CAP (community acquired pneumonia)   Septic shock (HCC)   Elevated LFTs   Cardiomegaly   Elevated troponin   Acute and chronic respiratory failure (acute-on-chronic) (HCC)   Dyspnea   Hypoxemia   RVF (right ventricular failure) (HCC)   Shock circulatory (HCC)   Chest pain   Palliative care by specialist   Goals of care, counseling/discussion   General weakness   Pressure injury of skin   Constipation   SBO (small bowel obstruction) (HCC)  1 acute hypoxemic/hypercapnic respiratory failure with history of COPD -On presentation patient initially required BiPAP however currently on high flow nasal cannula at 3 L with sats of 91%. -CT angiogram chest done negative for PE, however concerning for multifocal pneumonia. -COVID-19 screening negative and serology negative, respiratory viral panel negative, autoimmune panel largely unremarkable, blood cultures with no growth to date. -Patient initially was treated with IV Rocephin and azithromycin and steroids with repeat chest x-ray  showing no significant improvement. -Procalcitonin noted to be elevated on 9/9 of 2.6 and patient placed on Vanco and cefepime. -Vancomycin discontinued. -Patient was seen and followed by PCCM, Dr. Marchelle Gearing who recommended cefepime x7 days. -Continue Pulmicort nebs, Xopenex nebs, IV antibiotics.  2.  Severe constipation versus ileus versus small bowel obstruction -Patient seen in consultation by GI and general surgery was on NG tube for decompression which was subsequently removed this morning per general surgery. -Patient noted to have large loose stools 10/15/2020 -NG tube removed yesterday patient with trial of clear liquids however seems not to have tolerated it as patient had a large bout of coffee-ground emesis today per RN. -GI and general surgery following. -Concern for nutritional status, TPN being considered by general surgery however patient considering transitioning to comfort measures and as such TPN on hold for now until palliative can reassess patient for goals of care. -Abdominal films ordered and pending. -Patient back to n.p.o. status. -Per GI and general surgery.  3.  A. fib with RVR and hypotension/shock -Was on Neo-Synephrine drip which has subsequently been discontinued. -Patient received a dose of IV albumin on 10/15/2020, another dose of IV albumin ordered per PCCM today due to systolic blood pressures in the 80s.  -Currently on amiodarone drip per cardiology. -Daily EKG ordered per cardiology to follow-up on QTC as patient on amiodarone. -Amiodarone 150 mg bolus given on 10/15/2020 and patient currently on amiodarone drip . -On IV heparin for anticoagulation which is currently being held due to concerns of coffee-ground emesis this morning. -Per cardiology.  4.  Acute diastolic CHF/pulmonary hypertension -Due  to soft borderline blood pressures diuretics on hold.  -Patient does not look volume overloaded on examination. -BNP noted to be elevated on presentation at  889 and trended down to 430. -2D echo with EF of 59%, moderately enlarged RV with mildly reduced systolic function at the base and mid segments with relatively preserved apical systolic function.- -CT angiogram chest negative for PE. -Urine output of 1.1 L over the past 24 hours noted. -Current weight of 207.45 pounds.  Unsure of accuracy of weights. -Cardiology recommending repeat 2D echo once respiratory issues have resolved. -Patient with coffee-ground emesis this morning, also noted to have systolic blood pressures in the 80s and as such we will place on gentle hydration for the next 24 hours. -IV albumin ordered per PCCM -Per cardiology.  5.  Elevated troponin -Per cardiology likely secondary to demand ischemia. -Troponins mildly elevated peaked and trended down. -EKG changes of T wave abnormality in anterior leads noted. -2D echo with  EF of 59%, moderately enlarged RV with mildly reduced systolic function at the base and mid segments with relatively preserved apical systolic function.- -Per cardiology coronary calcification noted on CT scan and recommending outpatient Lexiscan versus coronary CTA once patient has recovered from acute illness. -Per cardiology.  6.  Gross hematuria -Seen in consultation by urology and patient underwent CBI. -Currently off CBI with resolution of hematuria. -On IV heparin, monitor.  7.  Yeast in urine -Status post full course Diflucan.  8.  Acute kidney injury on chronic kidney disease stage II -Resolved. -Avoid nephrotoxins, hypotension.  9.  Right middle lobe nodule -Repeat CT scan in 3 months.  10.  Hypokalemia -Repleted.  Potassium at 4.0.  Magnesium at 2.0.  11.  Pressure injury, poa Pressure Injury 10/15/20 Buttocks Left;Medial;Upper Stage 2 -  Partial thickness loss of dermis presenting as a shallow open injury with a red, pink wound bed without slough. pink, red, yellow (Active)  10/15/20 1119  Location: Buttocks  Location  Orientation: Left;Medial;Upper  Staging: Stage 2 -  Partial thickness loss of dermis presenting as a shallow open injury with a red, pink wound bed without slough.  Wound Description (Comments): pink, red, yellow  Present on Admission: No     Pressure Injury 10/15/20 Buttocks Left;Medial;Lower Stage 2 -  Partial thickness loss of dermis presenting as a shallow open injury with a red, pink wound bed without slough. red, yellow, pink (Active)  10/15/20 1120  Location: Buttocks  Location Orientation: Left;Medial;Lower  Staging: Stage 2 -  Partial thickness loss of dermis presenting as a shallow open injury with a red, pink wound bed without slough.  Wound Description (Comments): red, yellow, pink  Present on Admission: No     Pressure Injury 10/15/20 Buttocks Right;Medial Stage 2 -  Partial thickness loss of dermis presenting as a shallow open injury with a red, pink wound bed without slough. pink, red, yellow (Active)  10/15/20 1120  Location: Buttocks  Location Orientation: Right;Medial  Staging: Stage 2 -  Partial thickness loss of dermis presenting as a shallow open injury with a red, pink wound bed without slough.  Wound Description (Comments): pink, red, yellow  Present on Admission: No   12.  Goals of care -Patient with poor prognosis.  Patient not progressing or improving clinically.  Patient with another setback with large coffee-ground emesis this morning.  Patient told RN as well as me this morning that she was wanting to transition to comfort measures and wanted to be left alone.  Patient stated  to discuss with her family later on today.  We will have palliative care reassess for goals of care.        DVT prophylaxis: Heparin Code Status: DNR Family Communication: Updated patient.  No family at bedside. Disposition:   Status is: Inpatient  Remains inpatient appropriate because:Inpatient level of care appropriate due to severity of illness  Dispo: The patient is from:  Home              Anticipated d/c is to:  TBD              Patient currently is not medically stable to d/c.   Difficult to place patient No       Consultants:  Cardiology: Dr. Mayford Knife 09/24/2020 PCCM: Dr. Marchelle Gearing 09/23/2020 Urology: Dr. Alvester Morin 10/01/2020 -Wound care RN General surgery: Dr. Daphine Deutscher 10/07/2020 Gastroenterology: Dr. Levora Angel 10/10/2020 Palliative care: Dr. Neale Burly 19 2022       Procedures:  CT angiogram chest 09/22/2020, 09/24/2020 CT abdomen and pelvis 09/30/2020, 10/08/2020, 10/14/2020 Abdominal films 10/16/2020, 10/14/2020, 10/13/2020, 10/12/2020, 10/10/2020, 10/09/2020, 10/08/2020, 10/07/2020, 09/30/2020, Chest x-ray 09/25/2020, 09/26/2020, 09/27/2020, 09/29/2020, 10/04/2020, 10/10/2020 Abdominal ultrasound 09/23/2020, 2D echo 09/23/2020, 10/01/2020 Lower extremity Dopplers 09/24/2020  Antimicrobials:  Anti-infectives (From admission, onward)    Start     Dose/Rate Route Frequency Ordered Stop   10/14/20 0815  ceFEPIme (MAXIPIME) 2 g in sodium chloride 0.9 % 100 mL IVPB        2 g 200 mL/hr over 30 Minutes Intravenous Every 8 hours 10/14/20 0718 10/16/20 2359   10/12/20 1200  ceFEPIme (MAXIPIME) 2 g in sodium chloride 0.9 % 100 mL IVPB  Status:  Discontinued        2 g 200 mL/hr over 30 Minutes Intravenous Every 12 hours 10/12/20 1133 10/14/20 0718   10/10/20 2000  ceFEPIme (MAXIPIME) 2 g in sodium chloride 0.9 % 100 mL IVPB  Status:  Discontinued        2 g 200 mL/hr over 30 Minutes Intravenous Every 24 hours 10/10/20 1812 10/12/20 1133   10/10/20 1930  vancomycin (VANCOREADY) IVPB 1500 mg/300 mL        1,500 mg 150 mL/hr over 120 Minutes Intravenous  Once 10/10/20 1812 10/10/20 2045   10/10/20 1838  vancomycin variable dose per unstable renal function (pharmacist dosing)  Status:  Discontinued         Does not apply See admin instructions 10/10/20 1838 10/12/20 0834   10/09/20 1000  fluconazole (DIFLUCAN) tablet 200 mg        200 mg Per Tube Daily 10/09/20 0826 10/10/20 0903    10/08/20 1000  fluconazole (DIFLUCAN) tablet 200 mg  Status:  Discontinued        200 mg Oral Daily 10/07/20 1426 10/09/20 0826   10/03/20 1700  fluconazole (DIFLUCAN) tablet 200 mg  Status:  Discontinued        200 mg Oral Daily 10/03/20 1606 10/07/20 1426   09/23/20 1800  cefTRIAXone (ROCEPHIN) 2 g in sodium chloride 0.9 % 100 mL IVPB        2 g 200 mL/hr over 30 Minutes Intravenous Every 24 hours 09/22/20 1842 09/29/20 1749   09/23/20 1800  azithromycin (ZITHROMAX) 500 mg in sodium chloride 0.9 % 250 mL IVPB  Status:  Discontinued        500 mg 250 mL/hr over 60 Minutes Intravenous Every 24 hours 09/22/20 1842 09/25/20 1456   09/22/20 1630  cefTRIAXone (ROCEPHIN) 1 g in sodium chloride 0.9 % 100 mL  IVPB        1 g 200 mL/hr over 30 Minutes Intravenous  Once 09/22/20 1618 09/22/20 1806   09/22/20 1630  azithromycin (ZITHROMAX) 500 mg in sodium chloride 0.9 % 250 mL IVPB        500 mg 250 mL/hr over 60 Minutes Intravenous  Once 09/22/20 1618 09/22/20 1939         Subjective: NG tube out.  Patient laying in bed.  Patient noted to have a large amount of coffee-ground emesis per RN.  Patient denies any chest pain.  No shortness of breath.  Denies any significant abdominal pain.  Patient stating that she would like to transition to full comfort measures and be left alone however needs to discuss with her family.    Objective: Vitals:   10/16/20 0730 10/16/20 0800 10/16/20 0803 10/16/20 0830  BP: 93/63 (!) 83/51  (!) 84/54  Pulse: 93 (!) 112  (!) 110  Resp: 18 15  (!) 29  Temp:   (!) 97.5 F (36.4 C)   TempSrc:   Oral   SpO2: 93% 90% 93% 94%  Weight:      Height:        Intake/Output Summary (Last 24 hours) at 10/16/2020 0955 Last data filed at 10/16/2020 0900 Gross per 24 hour  Intake 2786.16 ml  Output 700 ml  Net 2086.16 ml    Filed Weights   10/15/20 0259 10/15/20 1000 10/16/20 0100  Weight: 94.5 kg 94 kg 94.1 kg    Examination:  General exam: Appears calm and  comfortable  Respiratory system: Clear to auscultation bilaterally anterior lung fields.  No wheezes, no crackles, no rhonchi.  Fair air movement. Cardiovascular system: Irregularly irregular.  No JVD, no murmurs rubs or gallops.  No lower extremity edema.  Gastrointestinal system: Abdomen is soft, distended, hypoactive bowel sounds.  Nontender to palpation.  Central nervous system: Alert and oriented. No focal neurological deficits. Extremities: Symmetric 5 x 5 power. Skin: No rashes, lesions or ulcers Psychiatry: Judgement and insight appear fair. Mood & affect appropriate.     Data Reviewed: I have personally reviewed following labs and imaging studies  CBC: Recent Labs  Lab 10/11/20 0742 10/12/20 0935 10/13/20 0245 10/14/20 0250 10/15/20 0314 10/16/20 0408  WBC 14.8* 10.9* 8.0 6.9 6.0 6.4  NEUTROABS 13.3*  --   --   --   --   --   HGB 14.7 14.5 13.4 13.6 12.5 13.1  HCT 43.0 42.9 39.9 39.7 36.3 38.9  MCV 91.9 92.1 92.8 93.2 91.7 91.7  PLT 267 256 197 155 137* 117*     Basic Metabolic Panel: Recent Labs  Lab 10/10/20 0233 10/11/20 0742 10/12/20 0935 10/13/20 0245 10/14/20 0250 10/15/20 0314 10/16/20 0408  NA 133*   < > 139 135 131* 131* 130*  K 4.5   < > 3.0* 3.4* 3.9 3.2* 4.0  CL 84*   < > 98 98 99 97* 100  CO2 31   < > GLUCOSE 135*   < > 112* 120* 120* 134* 112*  BUN 110*   < > 63* 45* 32* 22 19  CREATININE 2.47*   < > 0.82 0.64 0.52 0.37* 0.44  CALCIUM 8.5*   < > 8.1* 7.8* 7.8* 7.9* 7.7*  MG 3.3*  --   --  2.5*  --  2.0 2.0   < > = values in this interval not displayed.     GFR: Estimated Creatinine Clearance:  62.2 mL/min (by C-G formula based on SCr of 0.44 mg/dL).  Liver Function Tests: Recent Labs  Lab 10/12/20 0935 10/13/20 0245 10/14/20 0250 10/15/20 0314 10/16/20 0408  AST 49* 45* 48* 44* 57*  ALT 92* 81* 80* 76* 90*  ALKPHOS 65 58 57 51 63  BILITOT 1.7* 1.3* 1.1 1.1 1.2  PROT 5.6* 5.3* 5.4* 5.3* 5.3*  ALBUMIN 2.3* 2.1*  2.2* 2.4* 2.4*     CBG: Recent Labs  Lab 10/15/20 0737 10/15/20 1121 10/15/20 1647 10/15/20 2130 10/16/20 0809  GLUCAP 124* 120* 118* 133* 102*      No results found for this or any previous visit (from the past 240 hour(s)).       Radiology Studies: CT ABDOMEN PELVIS W CONTRAST  Result Date: 10/14/2020 CLINICAL DATA:  Bowel obstruction suspected. EXAM: CT ABDOMEN AND PELVIS WITH CONTRAST TECHNIQUE: Multidetector CT imaging of the abdomen and pelvis was performed using the standard protocol following bolus administration of intravenous contrast. CONTRAST:  40mL OMNIPAQUE IOHEXOL 350 MG/ML SOLN COMPARISON:  CT abdomen pelvis 10/08/2020, CT abdomen pelvis 09/30/2020 FINDINGS: Lower chest: Trace bilateral pleural effusions with bilateral lobe atelectasis. Right pleural base calcifications. Tiny hiatal hernia. Hepatobiliary: The hepatic parenchyma is diffusely hypodense compared to the splenic parenchyma consistent with fatty infiltration. No focal liver abnormality. Status post cholecystectomy. No biliary dilatation. Pancreas: No focal lesion. Normal pancreatic contour. No surrounding inflammatory changes. No main pancreatic ductal dilatation. Spleen: Normal in size without focal abnormality. Adrenals/Urinary Tract: No adrenal nodule bilaterally. Bilateral kidneys enhance symmetrically. Subcentimeter hypodensities are too small to characterize. No hydronephrosis. No hydroureter. The urinary bladder is fully decompressed with Foley balloon and tip terminating within its lumen. On delayed imaging, there is no urothelial wall thickening and there are no filling defects in the opacified portions of the bilateral collecting systems or ureters. Stomach/Bowel: Enteric tube with tip and side port terminating within the gastric lumen. PO contrast is noted to reach the rectum. Stomach is within normal limits. Redemonstration of several loops of proximal and mid small bowel dilatation with gas and fluid  measuring up to 4.8 cm in caliber. There is a change in caliber of the small bowel within the mid abdomen in the setting of mesenteric swirling (5:46, 2:51) with associated distal small bowel that is fully collapsed but with its lumen filled with PO contrast. No pneumatosis. No evidence of large bowel wall thickening or dilatation. The rectum is under distended. Scattered sigmoid diverticulosis. The appendix not definitely identified. Vascular/Lymphatic: No abdominal aorta or iliac aneurysm. Severe atherosclerotic plaque of the aorta and its branches. No abdominal, pelvic, or inguinal lymphadenopathy. Reproductive: Status post hysterectomy. No adnexal masses. Other: Persistent trace free fluid along the pericolic gutters in the pelvis. No intraperitoneal free gas. No organized fluid collection. Musculoskeletal: No abdominal wall hernia or abnormality. No suspicious lytic or blastic osseous lesions. No acute displaced fracture. Multilevel degenerative changes of the spine. IMPRESSION: 1. Findings suggestive of a partial small bowel obstruction with associated mesenteric swirling within the mid abdomen. Differential diagnosis includes an ileus. Small bowel measures up to 4.8 cm in caliber with PO contrast reaching the rectum. 2. Nonspecific persistent trace free fluid. 3. Scattered sigmoid diverticulosis with no acute diverticulitis. 4. Hepatic steatosis. 5. Bilateral trace pleural effusions with passive atelectasis. Superimposed infection/inflammation not excluded. 6. Enteric tube in good position. 7.  Aortic Atherosclerosis (ICD10-I70.0). Electronically Signed   By: Tish Frederickson M.D.   On: 10/14/2020 16:27   DG Abd 2 Views  Result  Date: 10/16/2020 CLINICAL DATA:  Weakness, abdominal pain EXAM: ABDOMEN - 2 VIEW COMPARISON:  10/14/2020 FINDINGS: Marked gaseous distension of small bowel loops again noted, stable since prior study. No real change. No organomegaly or free air. IMPRESSION: Continued marked gaseous  distention of small bowel loops concerning for small bowel obstruction, unchanged. Electronically Signed   By: Charlett Nose M.D.   On: 10/16/2020 09:48   [place] EKG SITE RITE  Result Date: 10/16/2020 If Site Rite image not attached, placement could not be confirmed due to current cardiac rhythm.       Scheduled Meds:  bisacodyl  10 mg Rectal BID   budesonide (PULMICORT) nebulizer solution  0.5 mg Nebulization BID   chlorhexidine  15 mL Mouth Rinse BID   Chlorhexidine Gluconate Cloth  6 each Topical Daily   insulin aspart  0-9 Units Subcutaneous TID WC   levalbuterol  1.25 mg Nebulization BID   liver oil-zinc oxide   Topical TID   mouth rinse  15 mL Mouth Rinse q12n4p   pantoprazole (PROTONIX) IV  40 mg Intravenous Q12H   polyethylene glycol  17 g Per Tube TID   Continuous Infusions:  sodium chloride Stopped (10/15/20 2047)   sodium chloride 75 mL/hr at 10/16/20 0948   amiodarone 30 mg/hr (10/16/20 0939)   ceFEPime (MAXIPIME) IV Stopped (10/16/20 0612)     LOS: 24 days    Time spent: 40 minutes    Ramiro Harvest, MD [facility]   To contact the attending provider between 7A-7P or the covering provider during after hours 7P-7A, please log into the web site [email or web address] and access using universal [place] password for that web site. If you do not have the password, please call the hospital operator.  10/16/2020, 9:55 AM

## 2020-10-16 NOTE — Progress Notes (Signed)
Patient projectile vomited a large amount of brown and dark coffee ground emesis. Patient also had a large loose brown bowel movement. Patient said she has discomfort around her stomach. Patient was cleaned up and assessed. Pt stomach is distended, hard and has faint bowel sounds. BP 85/54 (60) HR 113 90% 02 RR 24. NP on call J.Garner Nash notified. Orders received to give 500CC bolus over 2 hr.

## 2020-10-16 NOTE — Plan of Care (Signed)
  Problem: Safety: Goal: Ability to remain free from injury will improve Outcome: Progressing   

## 2020-10-16 NOTE — Progress Notes (Signed)
NAME:  Kimberly Ballard, MRN:  [id number], DOB:  [birth year], LOS: 24 ADMISSION DATE:  09/22/2020, CONSULTATION DATE:  09/23/2020 REFERRING MD:  Adela Glimpse - TR[facility] CHIEF COMPLAINT:  Dyspnea, cough   BRIEF  80 year old woman presented to [facility] ED 8/22 with dyspnea, body aches, cough after a significant COVID exposure. PMHx significant for AF (on Eliquis), COPD and former tobacco abuse.  Per patient, her daughter was diagnosed with COVID and patient developed symptoms ~8/16.  She discussed concerns with her PCP and paxlovid was prescribed 8/18.  At [facility], she was tested for COVID twice (both negative PCRs) and IgG antibody was also negative. Per PCP notes, patient did receive the primary series of the COVID vaccine as well as 2 subsequent boosters.    Pertinent Medical History:  COPD Atrial fibrillation Former cigarette smoker  Significant Hospital Events: Including procedures, antibiotic start and stop dates in addition to other pertinent events   8/22 Admit to SDU, weak cough, progressive resp failure. COVID-19 and influenza negative. Respiratory viral panel negative. IV ceftriaxone and azithromycin initiated. Given IV Solu-Medrol followed by oral prednisone order. 8/23 Pulmonary asked to evaluate given worsening respiratory failure.  CT chest showing lower lobe consolidation.  There was concern about possible aspiration.  Echo obtained showed good left ventricular function with mild pulmonary artery hypertension. RUQ ultrasound showed hepatic steatosis, possibly cirrhosis based on nodular liver contour, no gallbladder 8/24 Cardiology consulted for elevated troponins.  This was felt secondary to worsening hypoxia and underlying pulmonary artery hypertension which was felt previously undiagnosed issue.  New onset atrial fibrillation with RVR.  CT angiogram ordered.  Increased oxygen requirement overnight, requiring BiPAP, later placed on 15 L/min via high flow.  Lower extremity ultrasound negative for DVT 8/25  Urine strep neg.  Still high FIO2. F/u COVID neg. Transitioned to oral CCB and DOAC. Getting IV lasix. Transitioned to headed High flow [place] during day and BIPAP at night.  8/26 Still w/ overall net positive fluid balance over hospital stay. CXR w/ worse edema. Lasix increased to 80 mg q 12. Azithromycin stopped (day 5) Steroids reduced.  Cardiology signed off. (See note from cards w/ plan to follow as out-pt)  8/27 Diuresed, CXR better, eating, got up to chair 8/28 -9/1 Remains on FIO2 80% despite aggressive diuresis. Net neg 11L. 8/31 NO BUBBLE SHUNT on echo 9/02 Ongoing High FiO2 requirements. Net -13.7L/admission (-1.6L/24H). Tolerating HFNC/BiPAP and nebulization treatments. New hematuria for which Urology consulted, started CBI. 9/3 Slight improvement in FiO2 9/4 PCCM signed off 9/8 Remains on HHFNC 25L/45% FiO2, pursed-lip breathing. NGT in place for ileus.Reports that her breathing feels more difficult since earlier today. Pursed-lip breathing on exam. Remains on HHFNC 25LPM/45% FiO2. Sats 87-94% today. Belly feels bloated, passing some gas but minimal Bms. NGT remains in place - CCS managing. Mildly TTP over RLQ 9/9 -  35%  L HHFNC, 60% fio2 and worse. Ileus ongoing. On Neo since last night.  Last lopressor and cardizem last night. This morning a Fib RVR and amio gtt started. Covid IgG - still negative Palliative care consult. 9/10 not on the ventilator.  Is on FiO2 55% with 25 L flow rate on heated high flow nasal cannula.  Is on Cardizem drip, amiodarone drip and also Neo-Synephrine.  Heart rate of 111.- 125. AKi getting better. On Nacl at 65ml/h.  She says she used BiPAP last night.  She had mentioned abdominal pain.  She is on NG tube with suction.  Repeat COVID IgG negative 9/11  NPO, ~1.1L out of NGT. On low dose neo + amio. HFNC 55%/25L flow. AKI improved.  9/12 20L / 40% HFNC > transitioned to [place] 15L 9/13 Weaned to 10L [place] O2.  Off neosynephrine  Interim History / Subjective:   Remains on [place] 5L  NGT removed 9/14 with trial of clear liquids Reported projectile vomiting episode of brown and dark coffee ground emesis  overnight with reported abdominal discomfort.  BP in the 80's s/p NS 500 ml bolus with some improvement.   Currently, patient denies any nausea or pain.  States she doesn't feel well and is tired.  Does not want her NGT replaced and possibly wanting transition to just comfort care.   Objective   Blood pressure 95/71, pulse (!) 101, temperature (!) 97.5 F (36.4 C), temperature source Oral, resp. rate 17, height 5\' 3"  (1.6 m), weight 94.1 kg, SpO2 93 %.        Intake/Output Summary (Last 24 hours) at 10/16/2020 0856 Last data filed at 10/16/2020 0556 Gross per 24 hour  Intake 2553.03 ml  Output 700 ml  Net 1853.03 ml   Filed Weights   10/15/20 0259 10/15/20 1000 10/16/20 0100  Weight: 94.5 kg 94 kg 94.1 kg   Physical Examination: General:  Elderly female lying in bed in NAD, appears tired HEENT: MM pink/dry Neuro: Alert, oriented, non focal  CV: rr, ST/ ~100-110 PULM:  non labored, clear anteriorly, diminished in bases GI: distended but not firm, high pitched BS throughout, mild tenderness on R to palpation, foley  Extremities: warm/dry, generalized +1 edema  Skin: no rashes   Resolved Hospital Problem List:  AKI Circulatory Shock   Assessment & Plan:   Acute Hypoxemic / Hypercarbic Respiratory Failure  Cor Pulmonale  Former Tobacco Abuse  Moderate Obstructive Lung Disease with Emphysema   Baseline moderate obstructive lung disease based on spirometry 2019.  New hypercarbia recognized this admission, compensated pCO2 56.2 on admit. Admit with bilateral infiltrates.  COVID negative, COVID IgG negative as well as PCR. Current symptoms not related to COVID. Cor Pulmonale identified on ECHO 8/23 with PA systolic of 48 and mild reduction in RV.  Negative shunt study. Consider BOOP/COP (previously on steroids during this admit), recurrent  aspiration.  - continue supplemental O2 for sat goal 88-94%, remains on [place] - hold on BiPAP given emesis/ aspiration risk - continue xopenex nebs - ongoing pulmonary hygiene as tolerated  - consider SLP r/o chronic aspiration   Ileus vs. SBO  Onset 9/6.   -per CCS/ GI  - NGT removed 9/14 and started on clear liquid diet.  Pt with projectile vomiting of dark coffee ground emesis overnight.  Patient does not want her NGT replaced.  Defer to CCS/ GI/ primary team.  Palliative following as well.  - continue PPI BID - goal K > 4, Mag >2 - decreasing platelet count 155-> 137-> 117 with coffee ground emesis overnight.  Hgb stable.  Heparin held per primary   Hypotension - UOP still adequate, renal indices stable - s/p NS bolus overnight with some improvement.  Will give albumin 25gm x 1 for volume repletion/ soft BP as she seems volume responsive.  Would avoid against add vasopressors unless aggressive care is wanted by patient, otherwise remains a DNR/ DNI  AFwRVR   S/p amio, neo, digoxin dosing.  Suspect element of volume depletion with cor pulmonale, high NGT output.  -off neo 9/12 pm.  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 No biopsy. No established diagnosis of portal hypertension. Would put her at risk of potential hepatopulmonary syndrome, although this is not well-established in this patient. -will need outpatient evaluation, defer to primary   Sepsis Possible pulmonary source, treated on admit. PCT elevated, but likely in setting of AKI / false positive. No fever / clear source.  -cefepime per primary, planned stop date 9/16   Best Practice:   Per Primary Team.  Nothing further to add.  PCCM will sign off.  Please call us back if we can be of any further assistance.     Critical Care  Time: n/a     Posey Boyer, ACNP [place] [facility] 10/16/2020, 8:56 AM  See Amion for pager If no response to pager, please call PCCM consult pager After 7:00 pm call Elink

## 2020-10-16 NOTE — Progress Notes (Addendum)
Subjective: Patient was seen and examined at bedside in presence of her daughter. She had emesis yesterday after NG tube was removed. Palliative care consult requested.    Objective: Vital signs in last 24 hours: Temp:  [97.5 F (36.4 C)-98.1 F (36.7 C)] 97.6 F (36.4 C) (09/15 1200) Pulse Rate:  [35-133] 118 (09/15 1200) Resp:  [13-29] 26 (09/15 1200) BP: (76-125)/(41-80) 109/59 (09/15 1200) SpO2:  [87 %-99 %] 95 % (09/15 1200) Weight:  [94.1 kg] 94.1 kg (09/15 1000) Weight change: 6.7 kg Last BM Date: 10/16/20  PE: On oxygen via nasal canula, hard of hearing, mildly dyspneic GENERAL: Able to speak a few words  ABDOMEN: Distended, bowel sounds not heard EXTREMITIES: No deformity  Lab Results: Results for orders placed or performed during the hospital encounter of 09/22/20 (from the past 48 hour(s))  Glucose, capillary     Status: Abnormal   Collection Time: 10/14/20  3:59 PM  Result Value Ref Range   Glucose-Capillary 124 (H) 70 - 99 mg/dL    Comment: Glucose reference range applies only to samples taken after fasting for at least 8 hours.   Comment 1 Notify RN    Comment 2 Document in Chart   Heparin level (unfractionated)     Status: None   Collection Time: 10/14/20  6:41 PM  Result Value Ref Range   Heparin Unfractionated 0.65 0.30 - 0.70 IU/mL    Comment: (NOTE) The clinical reportable range upper limit is being lowered to >1.10 to align with the FDA approved guidance for the current laboratory assay.  If heparin results are below expected values, and patient dosage has  been confirmed, suggest follow up testing of antithrombin III levels. Performed at [facility], [address] [place], [place] [address]   Glucose, capillary     Status: Abnormal   Collection Time: 10/14/20  9:25 PM  Result Value Ref Range   Glucose-Capillary 107 (H) 70 - 99 mg/dL    Comment: Glucose reference range applies only to samples taken after fasting for at least  8 hours.  Comprehensive metabolic panel     Status: Abnormal   Collection Time: 10/15/20  3:14 AM  Result Value Ref Range   Sodium 131 (L) 135 - 145 mmol/L   Potassium 3.2 (L) 3.5 - 5.1 mmol/L    Comment: DELTA CHECK NOTED   Chloride 97 (L) 98 - 111 mmol/L   CO2 24 22 - 32 mmol/L   Glucose, Bld 134 (H) 70 - 99 mg/dL    Comment: Glucose reference range applies only to samples taken after fasting for at least 8 hours.   BUN 22 8 - 23 mg/dL   Creatinine, Ser 2.48 (L) 0.44 - 1.00 mg/dL   Calcium 7.9 (L) 8.9 - 10.3 mg/dL   Total Protein 5.3 (L) 6.5 - 8.1 g/dL   Albumin 2.4 (L) 3.5 - 5.0 g/dL   AST 44 (H) 15 - 41 U/L   ALT 76 (H) 0 - 44 U/L   Alkaline Phosphatase 51 38 - 126 U/L   Total Bilirubin 1.1 0.3 - 1.2 mg/dL   GFR, Estimated >18 >59 mL/min    Comment: (NOTE) Calculated using the CKD-EPI Creatinine Equation (2021)    Anion gap 10 5 - 15    Comment: Performed at [facility], [address] [place], [place] [address]  CBC     Status: Abnormal   Collection Time: 10/15/20  3:14 AM  Result Value Ref Range  WBC 6.0 4.0 - 10.5 K/uL   RBC 3.96 3.87 - 5.11 MIL/uL   Hemoglobin 12.5 12.0 - 15.0 g/dL   HCT 75.9 16.3 - 84.6 %   MCV 91.7 80.0 - 100.0 fL   MCH 31.6 26.0 - 34.0 pg   MCHC 34.4 30.0 - 36.0 g/dL   RDW 65.9 93.5 - 70.1 %   Platelets 137 (L) 150 - 400 K/uL    Comment: SPECIMEN CHECKED FOR CLOTS CONSISTENT WITH PREVIOUS RESULT REPEATED TO VERIFY    nRBC 0.0 0.0 - 0.2 %    Comment: Performed at [facility], [address], [place], [place] [address]  Heparin level (unfractionated)     Status: None   Collection Time: 10/15/20  3:14 AM  Result Value Ref Range   Heparin Unfractionated 0.55 0.30 - 0.70 IU/mL    Comment: (NOTE) The clinical reportable range upper limit is being lowered to >1.10 to align with the FDA approved guidance for the current laboratory assay.  If heparin results are below expected values, and patient dosage  has  been confirmed, suggest follow up testing of antithrombin III levels. Performed at [facility], [address], [place], [place] [address]   Magnesium     Status: None   Collection Time: 10/15/20  3:14 AM  Result Value Ref Range   Magnesium 2.0 1.7 - 2.4 mg/dL    Comment: Performed at [facility], [address], [place], [place] [address]  Glucose, capillary     Status: Abnormal   Collection Time: 10/15/20  7:37 AM  Result Value Ref Range   Glucose-Capillary 124 (H) 70 - 99 mg/dL    Comment: Glucose reference range applies only to samples taken after fasting for at least 8 hours.  Glucose, capillary     Status: Abnormal   Collection Time: 10/15/20 11:21 AM  Result Value Ref Range   Glucose-Capillary 120 (H) 70 - 99 mg/dL    Comment: Glucose reference range applies only to samples taken after fasting for at least 8 hours.  Glucose, capillary     Status: Abnormal   Collection Time: 10/15/20  4:47 PM  Result Value Ref Range   Glucose-Capillary 118 (H) 70 - 99 mg/dL    Comment: Glucose reference range applies only to samples taken after fasting for at least 8 hours.  Glucose, capillary     Status: Abnormal   Collection Time: 10/15/20  9:30 PM  Result Value Ref Range   Glucose-Capillary 133 (H) 70 - 99 mg/dL    Comment: Glucose reference range applies only to samples taken after fasting for at least 8 hours.  Comprehensive metabolic panel     Status: Abnormal   Collection Time: 10/16/20  4:08 AM  Result Value Ref Range   Sodium 130 (L) 135 - 145 mmol/L   Potassium 4.0 3.5 - 5.1 mmol/L    Comment: DELTA CHECK NOTED   Chloride 100 98 - 111 mmol/L   CO2 24 22 - 32 mmol/L   Glucose, Bld 112 (H) 70 - 99 mg/dL    Comment: Glucose reference range applies only to samples taken after fasting for at least 8 hours.   BUN 19 8 - 23 mg/dL   Creatinine, Ser 7.62 0.44 - 1.00 mg/dL   Calcium 7.7 (L) 8.9 - 10.3 mg/dL   Total Protein 5.3 (L) 6.5 - 8.1  g/dL   Albumin 2.4 (L) 3.5 - 5.0 g/dL   AST 57 (H) 15 -  41 U/L   ALT 90 (H) 0 - 44 U/L   Alkaline Phosphatase 63 38 - 126 U/L   Total Bilirubin 1.2 0.3 - 1.2 mg/dL   GFR, Estimated >69 >67 mL/min    Comment: (NOTE) Calculated using the CKD-EPI Creatinine Equation (2021)    Anion gap 6 5 - 15    Comment: Performed at [facility], [address], [place], [place] [address]  Heparin level (unfractionated)     Status: None   Collection Time: 10/16/20  4:08 AM  Result Value Ref Range   Heparin Unfractionated 0.49 0.30 - 0.70 IU/mL    Comment: (NOTE) The clinical reportable range upper limit is being lowered to >1.10 to align with the FDA approved guidance for the current laboratory assay.  If heparin results are below expected values, and patient dosage has  been confirmed, suggest follow up testing of antithrombin III levels. Performed at [facility], [address], [place], [place] [address]   CBC     Status: Abnormal   Collection Time: 10/16/20  4:08 AM  Result Value Ref Range   WBC 6.4 4.0 - 10.5 K/uL   RBC 4.24 3.87 - 5.11 MIL/uL   Hemoglobin 13.1 12.0 - 15.0 g/dL   HCT 02.5 85.2 - 77.8 %   MCV 91.7 80.0 - 100.0 fL   MCH 30.9 26.0 - 34.0 pg   MCHC 33.7 30.0 - 36.0 g/dL   RDW 24.2 35.3 - 61.4 %   Platelets 117 (L) 150 - 400 K/uL    Comment: SPECIMEN CHECKED FOR CLOTS Immature Platelet Fraction may be clinically indicated, consider ordering this additional test [id number] REPEATED TO VERIFY PLATELET COUNT CONFIRMED BY SMEAR    nRBC 0.0 0.0 - 0.2 %    Comment: Performed at [facility], [address], [place], [place] [address]  Magnesium     Status: None   Collection Time: 10/16/20  4:08 AM  Result Value Ref Range   Magnesium 2.0 1.7 - 2.4 mg/dL    Comment: Performed at [facility], [address], [place], [place] [address]  Glucose, capillary     Status: Abnormal   Collection Time:  10/16/20  8:09 AM  Result Value Ref Range   Glucose-Capillary 102 (H) 70 - 99 mg/dL    Comment: Glucose reference range applies only to samples taken after fasting for at least 8 hours.   Comment 1 Notify RN    Comment 2 Document in Chart   Glucose, capillary     Status: Abnormal   Collection Time: 10/16/20 11:31 AM  Result Value Ref Range   Glucose-Capillary 105 (H) 70 - 99 mg/dL    Comment: Glucose reference range applies only to samples taken after fasting for at least 8 hours.   Comment 1 Notify RN    Comment 2 Document in Chart     Studies/Results: CT ABDOMEN PELVIS W CONTRAST  Result Date: 10/14/2020 CLINICAL DATA:  Bowel obstruction suspected. EXAM: CT ABDOMEN AND PELVIS WITH CONTRAST TECHNIQUE: Multidetector CT imaging of the abdomen and pelvis was performed using the standard protocol following bolus administration of intravenous contrast. CONTRAST:  74mL OMNIPAQUE IOHEXOL 350 MG/ML SOLN COMPARISON:  CT abdomen pelvis 10/08/2020, CT abdomen pelvis 09/30/2020 FINDINGS: Lower chest: Trace bilateral pleural effusions with bilateral lobe atelectasis. Right pleural base calcifications. Tiny hiatal hernia. Hepatobiliary: The hepatic parenchyma is diffusely hypodense compared to the splenic parenchyma consistent with fatty infiltration. No focal liver abnormality. Status  post cholecystectomy. No biliary dilatation. Pancreas: No focal lesion. Normal pancreatic contour. No surrounding inflammatory changes. No main pancreatic ductal dilatation. Spleen: Normal in size without focal abnormality. Adrenals/Urinary Tract: No adrenal nodule bilaterally. Bilateral kidneys enhance symmetrically. Subcentimeter hypodensities are too small to characterize. No hydronephrosis. No hydroureter. The urinary bladder is fully decompressed with Foley balloon and tip terminating within its lumen. On delayed imaging, there is no urothelial wall thickening and there are no filling defects in the opacified portions of the  bilateral collecting systems or ureters. Stomach/Bowel: Enteric tube with tip and side port terminating within the gastric lumen. PO contrast is noted to reach the rectum. Stomach is within normal limits. Redemonstration of several loops of proximal and mid small bowel dilatation with gas and fluid measuring up to 4.8 cm in caliber. There is a change in caliber of the small bowel within the mid abdomen in the setting of mesenteric swirling (5:46, 2:51) with associated distal small bowel that is fully collapsed but with its lumen filled with PO contrast. No pneumatosis. No evidence of large bowel wall thickening or dilatation. The rectum is under distended. Scattered sigmoid diverticulosis. The appendix not definitely identified. Vascular/Lymphatic: No abdominal aorta or iliac aneurysm. Severe atherosclerotic plaque of the aorta and its branches. No abdominal, pelvic, or inguinal lymphadenopathy. Reproductive: Status post hysterectomy. No adnexal masses. Other: Persistent trace free fluid along the pericolic gutters in the pelvis. No intraperitoneal free gas. No organized fluid collection. Musculoskeletal: No abdominal wall hernia or abnormality. No suspicious lytic or blastic osseous lesions. No acute displaced fracture. Multilevel degenerative changes of the spine. IMPRESSION: 1. Findings suggestive of a partial small bowel obstruction with associated mesenteric swirling within the mid abdomen. Differential diagnosis includes an ileus. Small bowel measures up to 4.8 cm in caliber with PO contrast reaching the rectum. 2. Nonspecific persistent trace free fluid. 3. Scattered sigmoid diverticulosis with no acute diverticulitis. 4. Hepatic steatosis. 5. Bilateral trace pleural effusions with passive atelectasis. Superimposed infection/inflammation not excluded. 6. Enteric tube in good position. 7.  Aortic Atherosclerosis (ICD10-I70.0). Electronically Signed   By: Tish Frederickson M.D.   On: 10/14/2020 16:27   DG Abd 2  Views  Result Date: 10/16/2020 CLINICAL DATA:  Weakness, abdominal pain EXAM: ABDOMEN - 2 VIEW COMPARISON:  10/14/2020 FINDINGS: Marked gaseous distension of small bowel loops again noted, stable since prior study. No real change. No organomegaly or free air. IMPRESSION: Continued marked gaseous distention of small bowel loops concerning for small bowel obstruction, unchanged. Electronically Signed   By: Charlett Nose M.D.   On: 10/16/2020 09:48   [place] EKG SITE RITE  Result Date: 10/16/2020 If Site Rite image not attached, placement could not be confirmed due to current cardiac rhythm.   Medications: I have reviewed the patient's current medications.  Assessment: Small bowel obstruction-Marked gaseous distention of small bowel loops, unchanged on abdominal x-ray today.  NG tube was removed yesterday and patient had large volume coffee-ground emesis along with large loose brown bowel movement overnight  Plan: I had a detailed discussion with the patient and her daughter at bedside  Patient does not want any surgical intervention  Discussed with patient that NG tube placement may help with decompression, but since she has had NG tube for decompression for several days without any improvement, the chances of improvement without surgical intervention is very low.  Patient and her daughter understand, and want to talk with patient's son to decide regarding NG tube placement.  Her prognosis is very  poor and she understands that with continued small bowel obstruction, she may have intestinal perforation which may lead to septic shock and other complications such as aspiration.   Kerin Salen, MD 10/16/2020, 2:18 PM

## 2020-10-16 NOTE — Evaluation (Signed)
SLP Cancellation Note  Patient Details Name: ALIZAE BECHTEL MRN: [id number] DOB: [birth year]   Cancelled treatment:       Reason Eval/Treat Not Completed: Other (comment);Medical issues which prohibited therapy (pt now npo, per chart - pt vomiting coffee ground emesis) [name], MS [facility] SLP Acute Rehab Services Office [phone] Pager [phone]   Chales Abrahams 10/16/2020, 9:36 AM

## 2020-10-16 NOTE — Progress Notes (Signed)
Nutrition Follow-up  DOCUMENTATION CODES:   Obesity unspecified  INTERVENTION:  - will monitor for GOC decisions and associated plans moving forward.   NUTRITION DIAGNOSIS:   Inadequate oral intake related to inability to eat as evidenced by NPO status. -ongoing  GOAL:   Patient will meet greater than or equal to 90% of their needs -unable to meet   MONITOR:   Diet advancement, Labs, Weight trends, Skin  ASSESSMENT:   80 year old female with medical history of afib, COPD, and former tobacco abuse. She presented to the ED on 8/22 due to dyspnea, body aches, and cough following a COVID-19 exposure.  Patient discussed in rounds this AM.   Diet advanced to CLD yesterday at 0830 and changed back to NPO today at 0915. No intakes documented during the 24 hours on CLD.   NGT was placed in L nare on 9/6 and was removed yesterday at 0900. Patient declines for NGT to be replaced today.   Palliative Care MD saw patient and daughter at bedside earlier this afternoon and likely moving toward comfort care, but daughter is interested in discussing with Surgery team again before fully making a decision.   Previous plan for PICC placement and TPN initiation today but this is currently on hold.   Weight trending up 9/13-9/14 and weight yesterday and today are highest weights since admission (admit date: 8/22). Non-pitting edema to all extremities and noted to be -3.4 L since admission.      Labs reviewed; CBGs: 102 and 105 mg/dl, Na: 782 mmol/l, Ca: 7.7 mg/dl, LFTs elevated. Medications reviewed; 10 mg dulcolax BID, sliding scale novolog, 40 mg IV protonix BID, 17 g miralax TID. IVF; NS @ 75 ml/hr.   Diet Order:   Diet Order             Diet NPO time specified Except for: Ice Chips, Sips with Meds  Diet effective now                   EDUCATION NEEDS:   Not appropriate for education at this time  Skin:  Skin Assessment: Skin Integrity Issues: Skin Integrity Issues:: Stage  II, Other (Comment) Stage II: L buttocks x2 (newly documented 9/14); R buttocks (newly documented 9/14) Other: MASD to bilateral buttocks (documented 8/30)  Last BM:  9/15 (type 7)  Height:   Ht Readings from Last 1 Encounters:  09/22/20 5\' 3"  (1.6 m)    Weight:   Wt Readings from Last 1 Encounters:  10/16/20 94.1 kg     Estimated Nutritional Needs:  Kcal:  1950-2120 kcal Protein:  95-110 grams Fluid:  >/= 2.1 L/day     10/18/20, MS, RD, LDN, CNSC Inpatient Clinical Dietitian RD pager # available in AMION  After hours/weekend pager # available in [facility]

## 2020-10-16 NOTE — Progress Notes (Addendum)
Progress Note     Subjective: Patient vomited yesterday s/p NGT removal although continued to have BMs as well. Patient reports worsening abdominal distention and increased abdominal pain overnight. We discussed the possibility of needing surgical intervention at this point and patient expressed "I'm so tired, I just want to be done."  Objective: Vital signs in last 24 hours: Temp:  [97.5 F (36.4 C)-98.1 F (36.7 C)] 97.5 F (36.4 C) (09/15 0803) Pulse Rate:  [35-133] 114 (09/15 1000) Resp:  [13-29] 24 (09/15 1000) BP: (76-117)/(41-80) 96/61 (09/15 1000) SpO2:  [87 %-99 %] 94 % (09/15 1000) Weight:  [94.1 kg] 94.1 kg (09/15 0100) Last BM Date: 10/16/20  Intake/Output from previous day: 09/14 0701 - 09/15 0700 In: 2553 [P.O.:480; I.V.:961; IV Piggyback:1112.1] Out: 2100 [Urine:1100; Emesis/NG output:1000] Intake/Output this shift: Total I/O In: 233.1 [I.V.:179.9; IV Piggyback:53.2] Out: 150 [Urine:150]  PE: General: pleasant, WD, chronically ill appearing female who is laying in bed in NAD Heart: irregularly irregular with rate in 90s-100s Lungs: rales in bilateral bases. getting breathing treatment  Abd: soft, mild ttp in upper abdomen, more distended, BS hypoactive   Lab Results:  Recent Labs    10/15/20 0314 10/16/20 0408  WBC 6.0 6.4  HGB 12.5 13.1  HCT 36.3 38.9  PLT 137* 117*   BMET Recent Labs    10/15/20 0314 10/16/20 0408  NA 131* 130*  K 3.2* 4.0  CL 97* 100  CO2 24 24  GLUCOSE 134* 112*  BUN 22 19  CREATININE 0.37* 0.44  CALCIUM 7.9* 7.7*   PT/INR No results for input(s): LABPROT, INR in the last 72 hours. CMP     Component Value Date/Time   NA 130 (L) 10/16/2020 0408   K 4.0 10/16/2020 0408   CL 100 10/16/2020 0408   CO2 24 10/16/2020 0408   GLUCOSE 112 (H) 10/16/2020 0408   BUN 19 10/16/2020 0408   CREATININE 0.44 10/16/2020 0408   CALCIUM 7.7 (L) 10/16/2020 0408   PROT 5.3 (L) 10/16/2020 0408   ALBUMIN 2.4 (L) 10/16/2020 0408    AST 57 (H) 10/16/2020 0408   ALT 90 (H) 10/16/2020 0408   ALKPHOS 63 10/16/2020 0408   BILITOT 1.2 10/16/2020 0408   GFRNONAA >60 10/16/2020 0408   GFRAA  09/16/2009 1757    >60        The eGFR has been calculated using the MDRD equation. This calculation has not been validated in all clinical situations. eGFR's persistently <60 mL/min signify possible Chronic Kidney Disease.   Lipase     Component Value Date/Time   LIPASE 54 (H) 09/30/2020 0250       Studies/Results: CT ABDOMEN PELVIS W CONTRAST  Result Date: 10/14/2020 CLINICAL DATA:  Bowel obstruction suspected. EXAM: CT ABDOMEN AND PELVIS WITH CONTRAST TECHNIQUE: Multidetector CT imaging of the abdomen and pelvis was performed using the standard protocol following bolus administration of intravenous contrast. CONTRAST:  84m OMNIPAQUE IOHEXOL 350 MG/ML SOLN COMPARISON:  CT abdomen pelvis 10/08/2020, CT abdomen pelvis 09/30/2020 FINDINGS: Lower chest: Trace bilateral pleural effusions with bilateral lobe atelectasis. Right pleural base calcifications. Tiny hiatal hernia. Hepatobiliary: The hepatic parenchyma is diffusely hypodense compared to the splenic parenchyma consistent with fatty infiltration. No focal liver abnormality. Status post cholecystectomy. No biliary dilatation. Pancreas: No focal lesion. Normal pancreatic contour. No surrounding inflammatory changes. No main pancreatic ductal dilatation. Spleen: Normal in size without focal abnormality. Adrenals/Urinary Tract: No adrenal nodule bilaterally. Bilateral kidneys enhance symmetrically. Subcentimeter hypodensities are too small to  characterize. No hydronephrosis. No hydroureter. The urinary bladder is fully decompressed with Foley balloon and tip terminating within its lumen. On delayed imaging, there is no urothelial wall thickening and there are no filling defects in the opacified portions of the bilateral collecting systems or ureters. Stomach/Bowel: Enteric tube with  tip and side port terminating within the gastric lumen. PO contrast is noted to reach the rectum. Stomach is within normal limits. Redemonstration of several loops of proximal and mid small bowel dilatation with gas and fluid measuring up to 4.8 cm in caliber. There is a change in caliber of the small bowel within the mid abdomen in the setting of mesenteric swirling (5:46, 2:51) with associated distal small bowel that is fully collapsed but with its lumen filled with PO contrast. No pneumatosis. No evidence of large bowel wall thickening or dilatation. The rectum is under distended. Scattered sigmoid diverticulosis. The appendix not definitely identified. Vascular/Lymphatic: No abdominal aorta or iliac aneurysm. Severe atherosclerotic plaque of the aorta and its branches. No abdominal, pelvic, or inguinal lymphadenopathy. Reproductive: Status post hysterectomy. No adnexal masses. Other: Persistent trace free fluid along the pericolic gutters in the pelvis. No intraperitoneal free gas. No organized fluid collection. Musculoskeletal: No abdominal wall hernia or abnormality. No suspicious lytic or blastic osseous lesions. No acute displaced fracture. Multilevel degenerative changes of the spine. IMPRESSION: 1. Findings suggestive of a partial small bowel obstruction with associated mesenteric swirling within the mid abdomen. Differential diagnosis includes an ileus. Small bowel measures up to 4.8 cm in caliber with PO contrast reaching the rectum. 2. Nonspecific persistent trace free fluid. 3. Scattered sigmoid diverticulosis with no acute diverticulitis. 4. Hepatic steatosis. 5. Bilateral trace pleural effusions with passive atelectasis. Superimposed infection/inflammation not excluded. 6. Enteric tube in good position. 7.  Aortic Atherosclerosis (ICD10-I70.0). Electronically Signed   By: Iven Finn M.D.   On: 10/14/2020 16:27   DG Abd 2 Views  Result Date: 10/16/2020 CLINICAL DATA:  Weakness, abdominal pain  EXAM: ABDOMEN - 2 VIEW COMPARISON:  10/14/2020 FINDINGS: Marked gaseous distension of small bowel loops again noted, stable since prior study. No real change. No organomegaly or free air. IMPRESSION: Continued marked gaseous distention of small bowel loops concerning for small bowel obstruction, unchanged. Electronically Signed   By: Rolm Baptise M.D.   On: 10/16/2020 09:48   [place] EKG SITE RITE  Result Date: 10/16/2020 If Site Rite image not attached, placement could not be confirmed due to current cardiac rhythm.   Anti-infectives: Anti-infectives (From admission, onward)    Start     Dose/Rate Route Frequency Ordered Stop   10/14/20 0815  ceFEPIme (MAXIPIME) 2 g in sodium chloride 0.9 % 100 mL IVPB        2 g 200 mL/hr over 30 Minutes Intravenous Every 8 hours 10/14/20 0718 10/16/20 2359   10/12/20 1200  ceFEPIme (MAXIPIME) 2 g in sodium chloride 0.9 % 100 mL IVPB  Status:  Discontinued        2 g 200 mL/hr over 30 Minutes Intravenous Every 12 hours 10/12/20 1133 10/14/20 0718   10/10/20 2000  ceFEPIme (MAXIPIME) 2 g in sodium chloride 0.9 % 100 mL IVPB  Status:  Discontinued        2 g 200 mL/hr over 30 Minutes Intravenous Every 24 hours 10/10/20 1812 10/12/20 1133   10/10/20 1930  vancomycin (VANCOREADY) IVPB 1500 mg/300 mL        1,500 mg 150 mL/hr over 120 Minutes Intravenous  Once 10/10/20 1812  10/10/20 2045   10/10/20 1838  vancomycin variable dose per unstable renal function (pharmacist dosing)  Status:  Discontinued         Does not apply See admin instructions 10/10/20 1838 10/12/20 0834   10/09/20 1000  fluconazole (DIFLUCAN) tablet 200 mg        200 mg Per Tube Daily 10/09/20 0826 10/10/20 0903   10/08/20 1000  fluconazole (DIFLUCAN) tablet 200 mg  Status:  Discontinued        200 mg Oral Daily 10/07/20 1426 10/09/20 0826   10/03/20 1700  fluconazole (DIFLUCAN) tablet 200 mg  Status:  Discontinued        200 mg Oral Daily 10/03/20 1606 10/07/20 1426   09/23/20 1800   cefTRIAXone (ROCEPHIN) 2 g in sodium chloride 0.9 % 100 mL IVPB        2 g 200 mL/hr over 30 Minutes Intravenous Every 24 hours 09/22/20 1842 09/29/20 1749   09/23/20 1800  azithromycin (ZITHROMAX) 500 mg in sodium chloride 0.9 % 250 mL IVPB  Status:  Discontinued        500 mg 250 mL/hr over 60 Minutes Intravenous Every 24 hours 09/22/20 1842 09/25/20 1456   09/22/20 1630  cefTRIAXone (ROCEPHIN) 1 g in sodium chloride 0.9 % 100 mL IVPB        1 g 200 mL/hr over 30 Minutes Intravenous  Once 09/22/20 1618 09/22/20 1806   09/22/20 1630  azithromycin (ZITHROMAX) 500 mg in sodium chloride 0.9 % 250 mL IVPB        500 mg 250 mL/hr over 60 Minutes Intravenous  Once 09/22/20 1618 09/22/20 1939        Assessment/Plan Ileus vs PSBO - CT 9/13 with PSBO vs ileus, some mesenteric swirling noted in mid abdomen but contrast reaches to level of rectum which would argue against SBO - patient did not have significant abdominal pain that would be suggestive of vascular compromise of bowel  - NGT was removed and patient started on CLD but she did not tolerate this - abd more distended and slightly more ttp this AM - recommend keeping K>4.0 and Mg > 2.0 to optimize bowel function - mobilize!!! - patient continues to have liquid BMs - discussed with patient that I am not sure exactly what is the cause of PO intolerance but we may need to consider surgical exploration at this point. Patient's response this AM was that she is tired and she just wants to be done. I called and updated patient's daughter, Altha Harm, as well and she expressed that her mother has very clearly stated that she would not want to be on a ventilator given COPD. She understands that if we were to proceed with surgical intervention that there is no way to guarantee that patient would be able to wean off the ventilator. Palliative care planning to meet with patient again and re-address GOC, will follow for updated recommendations.    FEN:  ice chips - pending palliative discussions will need to consider PICC and TPN VTE: SCDs, heparin gtt on hold  ID: [id number] 9/11>>   Hypokalemia - K 3.2 this AM, IV replacement per primary, goal as outlined above COPD with PNA AKI A. Fib with RVR UTI RML pulmonary nodule Fatty liver   LOS: 24 days    Norm Parcel, [facility] Surgery 10/16/2020, 10:50 AM Please see Amion for pager number during day hours 7:00am-4:30pm   Addendum: Went back to patient's room after patient was seen by  palliative care and discussed with patient and patient's daughter recommendations for surgical intervention if aggressive care desired. Patient very clear that she does not want surgical intervention and understands that she may not get better without it. She understands that if she is unable to tolerate PO intake and small bowel continues to dilate that she is at risk for bowel perforation and death. She and her daughter would like to speak with GI again and I have let GI attending know. At this time, since no surgical intervention is desired, general surgery will not continue to actively follow this patient. Please let us know if there are any further questions or we can be of assistance in any way.   Norm Parcel, [facility] Surgery 10/16/2020, 2:10 PM Please see Amion for pager number during day hours 7:00am-4:30pm

## 2020-10-16 NOTE — Progress Notes (Signed)
Chaplain engaged in an initial visit with Kimberly Ballard and her daughter.  Chaplain provided education on Advanced Directive and told daughter how she can be reached when they are ready to have document notarized.  Kimberly Ballard and her daughter all still having some integral conversations about her medical care and what she truly desires.  Daughter is doing a great job of ensuring she provides clarity to her mom and is following her mom's lead in what she desires for herself.  Chaplain offered presence, education, and listening.  Chaplain will follow-up.     10/16/20 1400  Clinical Encounter Type  Visited With Patient and family together  Visit Type Initial;Social support  Referral From Palliative care team  Consult/Referral To Chaplain

## 2020-10-16 NOTE — Progress Notes (Signed)
Progress Note  Patient Name: Kimberly Ballard Date of Encounter: 10/16/2020  [facility] Cardiologist: Armanda Magic, MD   Subjective   Very slow progress. Patient denies any significant improvement in her breathing; however, she has not required a BiPAP in a few days and O2 is still being able to be weaned. Currently on 5L via HFNC .  Still having some abdominal pain.  NGT removed yesterday and now with large bout of coffee-ground emesis today and Heparin turned off.  Now having issues with hypotension.  Inpatient Medications    Scheduled Meds:  bisacodyl  10 mg Rectal BID   budesonide (PULMICORT) nebulizer solution  0.5 mg Nebulization BID   chlorhexidine  15 mL Mouth Rinse BID   Chlorhexidine Gluconate Cloth  6 each Topical Daily   insulin aspart  0-9 Units Subcutaneous TID WC   levalbuterol  1.25 mg Nebulization BID   liver oil-zinc oxide   Topical TID   mouth rinse  15 mL Mouth Rinse q12n4p   pantoprazole (PROTONIX) IV  40 mg Intravenous Q12H   polyethylene glycol  17 g Per Tube TID   Continuous Infusions:  sodium chloride Stopped (10/15/20 2047)   sodium chloride 75 mL/hr at 10/16/20 0948   amiodarone 30 mg/hr (10/16/20 0939)   ceFEPime (MAXIPIME) IV Stopped (10/16/20 0612)   PRN Meds: sodium chloride, ondansetron (ZOFRAN) IV, oxybutynin, simethicone, sodium chloride   Vital Signs    Vitals:   10/16/20 0830 10/16/20 0900 10/16/20 0930 10/16/20 1000  BP: (!) 84/54 (!) 85/59 (!) 88/50 96/61  Pulse: (!) 110 (!) 113 (!) 122 (!) 114  Resp: (!) 29 16 (!) 24 (!) 24  Temp:      TempSrc:      SpO2: 94% 95% 96% 94%  Weight:      Height:        Intake/Output Summary (Last 24 hours) at 10/16/2020 1056 Last data filed at 10/16/2020 1002 Gross per 24 hour  Intake 2378.48 ml  Output 850 ml  Net 1528.48 ml    Last 3 Weights 10/16/2020 10/15/2020 10/15/2020  Weight (lbs) 207 lb 7.3 oz 207 lb 3.7 oz 208 lb 5.4 oz  Weight (kg) 94.1 kg 94 kg 94.5 kg      Telemetry     Atrial fibrillation with RVR - Personally Reviewed  ECG    Atrial fibrillation with RVR and diffuse ST/T wave abnormality- Personally Reviewed  Physical Exam   GEN: very ill appearing HEENT: Normal NECK: No JVD; No carotid bruits LYMPHATICS: No lymphadenopathy CARDIAC:irregularly irregular and tachy, no murmurs, rubs, gallops RESPIRATORY: decreased BS anteriorly ABDOMEN: Soft, distended and tender to palpation MUSCULOSKELETAL:  No edema; No deformity  SKIN: Warm and dry NEUROLOGIC:  Alert and oriented x 3 PSYCHIATRIC:  Normal affect    Labs    High Sensitivity Troponin:   Recent Labs  Lab 09/24/20 0239 10/04/20 1721 10/04/20 1923 10/10/20 1225 10/11/20 0742  TROPONINIHS 52* 59* 67* 145* 104*       Chemistry Recent Labs  Lab 10/14/20 0250 10/15/20 0314 10/16/20 0408  NA 131* 131* 130*  K 3.9 3.2* 4.0  CL 99 97* 100  CO2 23 24 24   GLUCOSE 120* 134* 112*  BUN 32* 22 19  CREATININE 0.52 0.37* 0.44  CALCIUM 7.8* 7.9* 7.7*  PROT 5.4* 5.3* 5.3*  ALBUMIN 2.2* 2.4* 2.4*  AST 48* 44* 57*  ALT 80* 76* 90*  ALKPHOS 57 51 63  BILITOT 1.1 1.1 1.2  GFRNONAA >60 >60 >  60  ANIONGAP 9 10 6       Hematology Recent Labs  Lab 10/14/20 0250 10/15/20 0314 10/16/20 0408  WBC 6.9 6.0 6.4  RBC 4.26 3.96 4.24  HGB 13.6 12.5 13.1  HCT 39.7 36.3 38.9  MCV 93.2 91.7 91.7  MCH 31.9 31.6 30.9  MCHC 34.3 34.4 33.7  RDW 14.1 14.1 14.2  PLT 155 137* 117*     BNPNo results for input(s): BNP, PROBNP in the last 168 hours.   DDimer No results for input(s): DDIMER in the last 168 hours.   Radiology    CT ABDOMEN PELVIS W CONTRAST  Result Date: 10/14/2020 CLINICAL DATA:  Bowel obstruction suspected. EXAM: CT ABDOMEN AND PELVIS WITH CONTRAST TECHNIQUE: Multidetector CT imaging of the abdomen and pelvis was performed using the standard protocol following bolus administration of intravenous contrast. CONTRAST:  33mL OMNIPAQUE IOHEXOL 350 MG/ML SOLN COMPARISON:  CT abdomen  pelvis 10/08/2020, CT abdomen pelvis 09/30/2020 FINDINGS: Lower chest: Trace bilateral pleural effusions with bilateral lobe atelectasis. Right pleural base calcifications. Tiny hiatal hernia. Hepatobiliary: The hepatic parenchyma is diffusely hypodense compared to the splenic parenchyma consistent with fatty infiltration. No focal liver abnormality. Status post cholecystectomy. No biliary dilatation. Pancreas: No focal lesion. Normal pancreatic contour. No surrounding inflammatory changes. No main pancreatic ductal dilatation. Spleen: Normal in size without focal abnormality. Adrenals/Urinary Tract: No adrenal nodule bilaterally. Bilateral kidneys enhance symmetrically. Subcentimeter hypodensities are too small to characterize. No hydronephrosis. No hydroureter. The urinary bladder is fully decompressed with Foley balloon and tip terminating within its lumen. On delayed imaging, there is no urothelial wall thickening and there are no filling defects in the opacified portions of the bilateral collecting systems or ureters. Stomach/Bowel: Enteric tube with tip and side port terminating within the gastric lumen. PO contrast is noted to reach the rectum. Stomach is within normal limits. Redemonstration of several loops of proximal and mid small bowel dilatation with gas and fluid measuring up to 4.8 cm in caliber. There is a change in caliber of the small bowel within the mid abdomen in the setting of mesenteric swirling (5:46, 2:51) with associated distal small bowel that is fully collapsed but with its lumen filled with PO contrast. No pneumatosis. No evidence of large bowel wall thickening or dilatation. The rectum is under distended. Scattered sigmoid diverticulosis. The appendix not definitely identified. Vascular/Lymphatic: No abdominal aorta or iliac aneurysm. Severe atherosclerotic plaque of the aorta and its branches. No abdominal, pelvic, or inguinal lymphadenopathy. Reproductive: Status post hysterectomy. No  adnexal masses. Other: Persistent trace free fluid along the pericolic gutters in the pelvis. No intraperitoneal free gas. No organized fluid collection. Musculoskeletal: No abdominal wall hernia or abnormality. No suspicious lytic or blastic osseous lesions. No acute displaced fracture. Multilevel degenerative changes of the spine. IMPRESSION: 1. Findings suggestive of a partial small bowel obstruction with associated mesenteric swirling within the mid abdomen. Differential diagnosis includes an ileus. Small bowel measures up to 4.8 cm in caliber with PO contrast reaching the rectum. 2. Nonspecific persistent trace free fluid. 3. Scattered sigmoid diverticulosis with no acute diverticulitis. 4. Hepatic steatosis. 5. Bilateral trace pleural effusions with passive atelectasis. Superimposed infection/inflammation not excluded. 6. Enteric tube in good position. 7.  Aortic Atherosclerosis (ICD10-I70.0). Electronically Signed   By: 10/02/2020 M.D.   On: 10/14/2020 16:27   DG Abd 2 Views  Result Date: 10/16/2020 CLINICAL DATA:  Weakness, abdominal pain EXAM: ABDOMEN - 2 VIEW COMPARISON:  10/14/2020 FINDINGS: Marked gaseous distension of small bowel  loops again noted, stable since prior study. No real change. No organomegaly or free air. IMPRESSION: Continued marked gaseous distention of small bowel loops concerning for small bowel obstruction, unchanged. Electronically Signed   By: Charlett Nose M.D.   On: 10/16/2020 09:48   [place] EKG SITE RITE  Result Date: 10/16/2020 If Site Rite image not attached, placement could not be confirmed due to current cardiac rhythm.   Cardiac Studies   Complete Echo 09/23/2020: Impressions:  1. Left ventricular ejection fraction, by estimation, is 55 to 60%. Left  ventricular ejection fraction by 3D volume is 59 %. The left ventricle has  normal function. The left ventricle has no regional wall motion  abnormalities. There is moderate left  ventricular hypertrophy. Left  ventricular diastolic parameters are  consistent with Grade I diastolic dysfunction (impaired relaxation).   2. Right ventricular systolic function mildly reduced at base and mid,  with relative preserved apical systolic function. The right ventricular  size is moderately enlarged. There is moderately elevated pulmonary artery  systolic pressure. The estimated  right ventricular systolic pressure is 48.4 mmHg.   3. Left atrial size was mildly dilated.   4. Right atrial size was moderately dilated.   5. The mitral valve is normal in structure. Trivial mitral valve  regurgitation. No evidence of mitral stenosis.   6. Tricuspid valve regurgitation is mild to moderate.   7. The aortic valve is normal in structure. Aortic valve regurgitation is  not visualized. No aortic stenosis is present.   8. The inferior vena cava is dilated in size with <50% respiratory  variability, suggesting right atrial pressure of 15 mmHg.  _______________   Limited Echo with Bubble Study 10/01/2020: Impressions: 1. Left ventricular ejection fraction, by estimation, is 60 to 65%. The  left ventricle has normal function. There is moderate concentric left  ventricular hypertrophy. Left ventricular diastolic function could not be  evaluated.   2. The mitral valve is grossly normal. Trivial mitral valve  regurgitation. No evidence of mitral stenosis.   3. The aortic valve is tricuspid. There is mild calcification of the  aortic valve. Aortic valve regurgitation is trivial. No aortic stenosis is  present.   4. Agitated saline contrast bubble study was negative, with no evidence  of any interatrial shunt.   Conclusion(s)/Recommendation(s): Limited study, no major change from  recent study 09/23/20. Bubble study negative for intraatrial shunt.   Patient Profile     80 y.o. female with a history of COPD with prior tobacco use (quit in 2019) who was admitted on 09/22/2020 for acute hypoxic respiratory failure secondary  to COPD exacerbation and multifocal pneumonia after presenting with shortness of breath, body aches, cough, and decreased appetite. Negative for COVID this admission. Cardiology consulted for elevated troponin. Did alter develop atrial fibrillation with RVR and was started on Cardizem and Eliquis with control of rates but not restoration of sinus rhythm Cardiology signed off on 09/26/2020 with plans to have patient follow-up in the [facility] and likely DCCV after 304 weeks of anticoagulation. We were reconsulted on 10/01/2020 given recurrent RVR.  Assessment & Plan    New Onset Atrial Fibrillation with RVR - In the setting of acute hypoxic respiratory failure secondary to COPD exacerbation and pneumonia.  - Potassium 4.0  today.  - Magnesium 2.0 today - TSH normal. - Initially on Cardizem and Lopressor but discontinued due to hypotension requiring pressors.  - currently on IV Amio but HR remains elevated and hypotension limits boluses  of Amio to try to control HR - She also received a few dose of IV Digoxin. Last dose on 10/13/2020. - will give a dose of Digoxin 0.125mg  IV today - Initially started on Eliquis. However, this was held due to hematuria. Hemoglobin stable at 13.6. Treated with CBI. Restarted IV Heparin on 9/12.  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  - Most recent chest x-ray on 10/04/2020 showed bilateral lower atelectasis or infiltrates slightly improved from prior study. - Echo showed LVEF of 59% but moderately enlarged RV with mildly reduced systolic function at the base and mid segments with relatively preserved apical systolic function. This can be seen in acute PE  but chest CTA negative for this. - Initially diuresed with IV Lasix but this was stopped on 9/7. Net negative -6.76 L this admission.  I don't think weights are accurate the last few days (200 lbs >> 192 lbs >> 208 lbs >> 207lbs today). Dry weight around 190 lbs. - Does not appear significant volume overloaded on exam.  - Holding on additional Lasix at this time given persistently soft BP. - Will need to continue to monitor volume status closely now that IV fluids are being restarted. - RV dysfunction felt to be secondary to pulmonary hypertension. This is felt to be chronic but worsened in the setting of acute COPD exacerbation and pneumonia.    Hypotension/Shock - Hypotensive in setting of acute hematemasis - BP currently 96/36mmHg but was as low as 83/72mmHg earlier this am - Phenylephrine weaned off but may have to restart - IV fluids given this am - Palliative Care has been consulted to help clarify goals of care given little progress. Having hematemesis and does not want NGT again   Elevated Troponin - High-sensitivity troponin mildly elevated and flat. Initially peaked at 128 on 09/22/2020 and then downtrended. Increased again to 145 on 9/9 but again downtrending. - EKG showed T wave abnormality in anterior leads. No prior tracing for comparison. - Echo as above. - Troponin elevation not felt to be ACS and more consistent with demand. She does have coronary calcification on CT so may benefit from outpatient Lexiscan vs coronary CTA if she recovers from acute illness.    AKI - Resolved - Creatinine peaked at 1.69 on 9/10 but has since returned to baseline. Likely due to hypotension. - Creatinine 0.44  today. - Continue to monitor.   Hypokalemia - Potassium 4.0 today.  - Continue to monitor.   Otherwise, per primary team: - Acute hypoxic/hypercapnic respiratory failure requiring intermittent BiPAP  - Possible sepsis - COPD Exacerbation - Multifocal pneumonia  - Abdominal pain - Transamnitis: stable - Hyponatremia - Hematuria: Started on CBI. Urology consulted. - Ileus: S/p NGT placement. General Surgery  consulted.  Patient critically ill and wishing to consider comfort care at this time - await discussion with Palliative care team>>Will follow at a distance    For questions or updates, please contact [facility] Please consult [email or web address] for contact info under        Signed, Armanda Magic, MD  10/16/2020, 10:56 AM

## 2020-10-17 ENCOUNTER — Inpatient Hospital Stay: Payer: Medicare HMO | Admitting: Pulmonary Disease

## 2020-10-17 DIAGNOSIS — Z7189 Other specified counseling: Secondary | ICD-10-CM | POA: Diagnosis not present

## 2020-10-17 DIAGNOSIS — J189 Pneumonia, unspecified organism: Secondary | ICD-10-CM | POA: Diagnosis not present

## 2020-10-17 DIAGNOSIS — K5900 Constipation, unspecified: Secondary | ICD-10-CM | POA: Diagnosis not present

## 2020-10-17 DIAGNOSIS — J9601 Acute respiratory failure with hypoxia: Secondary | ICD-10-CM | POA: Diagnosis not present

## 2020-10-17 DIAGNOSIS — K56609 Unspecified intestinal obstruction, unspecified as to partial versus complete obstruction: Secondary | ICD-10-CM

## 2020-10-17 DIAGNOSIS — R531 Weakness: Secondary | ICD-10-CM | POA: Diagnosis not present

## 2020-10-17 DIAGNOSIS — I4891 Unspecified atrial fibrillation: Secondary | ICD-10-CM | POA: Diagnosis not present

## 2020-10-17 DIAGNOSIS — J9602 Acute respiratory failure with hypercapnia: Secondary | ICD-10-CM | POA: Diagnosis not present

## 2020-10-17 LAB — CBC WITH DIFFERENTIAL/PLATELET
Abs Immature Granulocytes: 0.08 10*3/uL — ABNORMAL HIGH (ref 0.00–0.07)
Basophils Absolute: 0 10*3/uL (ref 0.0–0.1)
Basophils Relative: 0 %
Eosinophils Absolute: 0.3 10*3/uL (ref 0.0–0.5)
Eosinophils Relative: 6 %
HCT: 36.9 % (ref 36.0–46.0)
Hemoglobin: 12.5 g/dL (ref 12.0–15.0)
Immature Granulocytes: 2 %
Lymphocytes Relative: 10 %
Lymphs Abs: 0.5 10*3/uL — ABNORMAL LOW (ref 0.7–4.0)
MCH: 31.5 pg (ref 26.0–34.0)
MCHC: 33.9 g/dL (ref 30.0–36.0)
MCV: 92.9 fL (ref 80.0–100.0)
Monocytes Absolute: 0.3 10*3/uL (ref 0.1–1.0)
Monocytes Relative: 6 %
Neutro Abs: 4.1 10*3/uL (ref 1.7–7.7)
Neutrophils Relative %: 76 %
Platelets: 97 10*3/uL — ABNORMAL LOW (ref 150–400)
RBC: 3.97 MIL/uL (ref 3.87–5.11)
RDW: 14.6 % (ref 11.5–15.5)
WBC: 5.4 10*3/uL (ref 4.0–10.5)
nRBC: 0 % (ref 0.0–0.2)

## 2020-10-17 LAB — GLUCOSE, CAPILLARY
Glucose-Capillary: 97 mg/dL (ref 70–99)
Glucose-Capillary: 80 mg/dL (ref 70–99)
Glucose-Capillary: 88 mg/dL (ref 70–99)
Glucose-Capillary: 86 mg/dL (ref 70–99)

## 2020-10-17 LAB — COMPREHENSIVE METABOLIC PANEL
ALT: 70 U/L — ABNORMAL HIGH (ref 0–44)
AST: 33 U/L (ref 15–41)
Albumin: 2.6 g/dL — ABNORMAL LOW (ref 3.5–5.0)
Alkaline Phosphatase: 55 U/L (ref 38–126)
Anion gap: 8 (ref 5–15)
BUN: 15 mg/dL (ref 8–23)
CO2: 21 mmol/L — ABNORMAL LOW (ref 22–32)
Calcium: 7.9 mg/dL — ABNORMAL LOW (ref 8.9–10.3)
Chloride: 106 mmol/L (ref 98–111)
Creatinine, Ser: 0.5 mg/dL (ref 0.44–1.00)
GFR, Estimated: >60 mL/min (ref >60)
Glucose, Bld: 97 mg/dL (ref 70–99)
Potassium: 3.6 mmol/L (ref 3.5–5.1)
Sodium: 135 mmol/L (ref 135–145)
Total Bilirubin: 1.1 mg/dL (ref 0.3–1.2)
Total Protein: 5.4 g/dL — ABNORMAL LOW (ref 6.5–8.1)

## 2020-10-17 LAB — MAGNESIUM: Magnesium: 1.9 mg/dL (ref 1.7–2.4)

## 2020-10-17 MED ORDER — POTASSIUM CHLORIDE 10 MEQ/100ML IV SOLN
10.0000 meq | INTRAVENOUS | Status: AC
  Administered 2020-10-17 (×2): 10 meq via INTRAVENOUS
  Filled 2020-10-17 (×2): qty 100

## 2020-10-17 MED ORDER — AMIODARONE LOAD VIA INFUSION
150.0000 mg | Freq: Once | INTRAVENOUS | Status: AC
  Administered 2020-10-17: 150 mg via INTRAVENOUS
  Filled 2020-10-17: qty 83.34

## 2020-10-17 NOTE — Progress Notes (Signed)
PROGRESS NOTE    Kimberly Ballard  VFI:[id number] DOB: 16-May-1940 DOA: 09/22/2020 PCP: System, Provider Not In    Brief Narrative:  80 y.o. female with a history of COPD presented with shortness of breath.  hypoxic respiratory failure.  Reported COVID exposure but tested negative.  CTA chest ruled out pulmonary embolism but showed bilateral infiltrates, hospital course complicated by multiple issues including AKI, hematuria, a fib with RVR and ileus/obstruction. Multiple subspecialty have been following her, she does not appear to make any progress, palliative care consulted as well for goals of care discussion.  had repeated CT abdomen pelvis: Finding suggestive of partial small bowel obstruction with associated mesenteric swirling in the mid abdomen. Differential include ileus. Surgery has been following, patient decline any surgery. GI following per Dr Marca Ancona chance for improvement without surgical intervention is very low. Palliative will meet with patient and family for further discussion.   Assessment & Plan:   Principal Problem:   Acute respiratory failure with hypoxia and hypercapnia (HCC) Active Problems:   CAP (community acquired pneumonia)   Septic shock (HCC)   Elevated LFTs   Cardiomegaly   Elevated troponin   Atrial fibrillation with RVR (HCC)   Acute and chronic respiratory failure (acute-on-chronic) (HCC)   Dyspnea   Hypoxemia   Ileus (HCC)   RVF (right ventricular failure) (HCC)   Shock circulatory (HCC)   Chest pain   Palliative care by specialist   Goals of care, counseling/discussion   General weakness   Pressure injury of skin   Constipation   SBO (small bowel obstruction) (HCC)  1 Acute hypoxemic/hypercapnic respiratory failure with history of COPD -On presentation patient initially required BiPAP, now on 3 L oxygen.  -CT angiogram chest done negative for PE, however concerning for multifocal pneumonia. -COVID-19 screening negative and serology negative,  respiratory viral panel negative, autoimmune panel largely unremarkable, blood cultures with no growth to date. -Patient initially was treated with IV Rocephin and azithromycin and steroids with repeat chest x-ray showing no significant improvement. -Procalcitonin noted to be elevated on 9/9 of 2.6 and patient placed on Vanco and cefepime. -Vancomycin discontinued. -Patient was seen and followed by PCCM, Dr. Marchelle Gearing who recommended cefepime x7 days. -Continue Pulmicort nebs, Xopenex nebs, IV antibiotics. -Completed cefepime.   2.  Severe constipation versus ileus versus small bowel obstruction -Patient seen in consultation by GI and general surgery was on NG tube for decompression which was subsequently removed. -Patient noted to have large loose stools 10/15/2020 -NG tube removed 9/14, patient with trial of clear liquids however seems not to have tolerated it as patient had a large bout of coffee-ground emesis today per RN. -GI and general surgery following. -Patient back to n.p.o. status. -Per GI and general surgery. Patient decline surgery. She is considering palliative care.  Palliative care following.   3.  A. fib with RVR and hypotension/shock -Was on Neo-Synephrine drip which has subsequently been discontinued. -Patient received a dose of IV albumin on 10/15/2020. -on amiodarone gtt.  -On IV heparin for anticoagulation which is currently being held due to concerns of coffee-ground emesis this morning. -Per cardiology.  4.  Acute diastolic CHF/pulmonary hypertension -Due to soft borderline blood pressures diuretics on hold.  -BNP noted to be elevated on presentation at 889 and trended down to 430. -2D echo with EF of 59%, moderately enlarged RV with mildly reduced systolic function at the base and mid segments with relatively preserved apical systolic function.- -CT angiogram chest negative for PE. -started on  IV fluids, monitor for sign of volume overload.  -IV albumin ordered  per PCCM -Per cardiology.  5.  Elevated troponin -Per cardiology likely secondary to demand ischemia. -EKG changes of T wave abnormality in anterior leads noted. -2D echo with  EF of 59%, moderately enlarged RV with mildly reduced systolic function at the base and mid segments with relatively preserved apical systolic function.- -Per cardiology coronary calcification noted on CT scan and recommending outpatient Lexiscan versus coronary CTA once patient has recovered from acute illness. -Per cardiology.  6.  Gross hematuria -Seen in consultation by urology and patient underwent CBI. -Currently off CBI with resolution of hematuria.   7.  Yeast in urine -Status post full course Diflucan.  8.  Acute kidney injury on chronic kidney disease stage II -Resolved. -Avoid nephrotoxins, hypotension.  9.  Right middle lobe nodule -Repeat CT scan in 3 months.  10.  Hypokalemia Replete IV  11.  Pressure injury, poa Pressure Injury 10/15/20 Buttocks Left;Medial;Upper Stage 2 -  Partial thickness loss of dermis presenting as a shallow open injury with a red, pink wound bed without slough. pink, red, yellow (Active)  10/15/20 1119  Location: Buttocks  Location Orientation: Left;Medial;Upper  Staging: Stage 2 -  Partial thickness loss of dermis presenting as a shallow open injury with a red, pink wound bed without slough.  Wound Description (Comments): pink, red, yellow  Present on Admission: No     Pressure Injury 10/15/20 Buttocks Left;Medial;Lower Stage 2 -  Partial thickness loss of dermis presenting as a shallow open injury with a red, pink wound bed without slough. red, yellow, pink (Active)  10/15/20 1120  Location: Buttocks  Location Orientation: Left;Medial;Lower  Staging: Stage 2 -  Partial thickness loss of dermis presenting as a shallow open injury with a red, pink wound bed without slough.  Wound Description (Comments): red, yellow, pink  Present on Admission: No     Pressure  Injury 10/15/20 Buttocks Right;Medial Stage 2 -  Partial thickness loss of dermis presenting as a shallow open injury with a red, pink wound bed without slough. pink, red, yellow (Active)  10/15/20 1120  Location: Buttocks  Location Orientation: Right;Medial  Staging: Stage 2 -  Partial thickness loss of dermis presenting as a shallow open injury with a red, pink wound bed without slough.  Wound Description (Comments): pink, red, yellow  Present on Admission: No   12.  Goals of care -Patient with poor prognosis.  Patient not progressing or improving clinically.  Palliative care following.         DVT prophylaxis: scd Code Status: DNR Family Communication: Updated patient.  No family at bedside. Disposition:   Status is: Inpatient  Remains inpatient appropriate because:Inpatient level of care appropriate due to severity of illness  Dispo: The patient is from: Home              Anticipated d/c is to:  TBD              Patient currently is not medically stable to d/c.   Difficult to place patient No       Consultants:  Cardiology: Dr. Mayford Knife 09/24/2020 PCCM: Dr. Marchelle Gearing 09/23/2020 Urology: Dr. Alvester Morin 10/01/2020 -Wound care RN General surgery: Dr. Daphine Deutscher 10/07/2020 Gastroenterology: Dr. Levora Angel 10/10/2020 Palliative care: Dr. Neale Burly 19 2022       Procedures:  CT angiogram chest 09/22/2020, 09/24/2020 CT abdomen and pelvis 09/30/2020, 10/08/2020, 10/14/2020 Abdominal films 10/16/2020, 10/14/2020, 10/13/2020, 10/12/2020, 10/10/2020, 10/09/2020, 10/08/2020, 10/07/2020, 09/30/2020, Chest x-ray  09/25/2020, 09/26/2020, 09/27/2020, 09/29/2020, 10/04/2020, 10/10/2020 Abdominal ultrasound 09/23/2020, 2D echo 09/23/2020, 10/01/2020 Lower extremity Dopplers 09/24/2020  Antimicrobials:  Anti-infectives (From admission, onward)    Start     Dose/Rate Route Frequency Ordered Stop   10/14/20 0815  ceFEPIme (MAXIPIME) 2 g in sodium chloride 0.9 % 100 mL IVPB        2 g 200 mL/hr over 30 Minutes Intravenous  Every 8 hours 10/14/20 0718 10/16/20 2200   10/12/20 1200  ceFEPIme (MAXIPIME) 2 g in sodium chloride 0.9 % 100 mL IVPB  Status:  Discontinued        2 g 200 mL/hr over 30 Minutes Intravenous Every 12 hours 10/12/20 1133 10/14/20 0718   10/10/20 2000  ceFEPIme (MAXIPIME) 2 g in sodium chloride 0.9 % 100 mL IVPB  Status:  Discontinued        2 g 200 mL/hr over 30 Minutes Intravenous Every 24 hours 10/10/20 1812 10/12/20 1133   10/10/20 1930  vancomycin (VANCOREADY) IVPB 1500 mg/300 mL        1,500 mg 150 mL/hr over 120 Minutes Intravenous  Once 10/10/20 1812 10/10/20 2045   10/10/20 1838  vancomycin variable dose per unstable renal function (pharmacist dosing)  Status:  Discontinued         Does not apply See admin instructions 10/10/20 1838 10/12/20 0834   10/09/20 1000  fluconazole (DIFLUCAN) tablet 200 mg        200 mg Per Tube Daily 10/09/20 0826 10/10/20 0903   10/08/20 1000  fluconazole (DIFLUCAN) tablet 200 mg  Status:  Discontinued        200 mg Oral Daily 10/07/20 1426 10/09/20 0826   10/03/20 1700  fluconazole (DIFLUCAN) tablet 200 mg  Status:  Discontinued        200 mg Oral Daily 10/03/20 1606 10/07/20 1426   09/23/20 1800  cefTRIAXone (ROCEPHIN) 2 g in sodium chloride 0.9 % 100 mL IVPB        2 g 200 mL/hr over 30 Minutes Intravenous Every 24 hours 09/22/20 1842 09/29/20 1749   09/23/20 1800  azithromycin (ZITHROMAX) 500 mg in sodium chloride 0.9 % 250 mL IVPB  Status:  Discontinued        500 mg 250 mL/hr over 60 Minutes Intravenous Every 24 hours 09/22/20 1842 09/25/20 1456   09/22/20 1630  cefTRIAXone (ROCEPHIN) 1 g in sodium chloride 0.9 % 100 mL IVPB        1 g 200 mL/hr over 30 Minutes Intravenous  Once 09/22/20 1618 09/22/20 1806   09/22/20 1630  azithromycin (ZITHROMAX) 500 mg in sodium chloride 0.9 % 250 mL IVPB        500 mg 250 mL/hr over 60 Minutes Intravenous  Once 09/22/20 1618 09/22/20 1939         Subjective: She feels miserable. Couldn't rest last  night. She wants palliative care. She is waiting to speak with her family.   Objective: Vitals:   10/17/20 1000 10/17/20 1200 10/17/20 1300 10/17/20 1600  BP: 121/85 127/80 106/62   Pulse: 75 (!) 110 99   Resp: 15 15 15    Temp:    (!) 97.4 F (36.3 C)  TempSrc:    Oral  SpO2: 95% 96% 94%   Weight:      Height:        Intake/Output Summary (Last 24 hours) at 10/17/2020 1709 Last data filed at 10/17/2020 1600 Gross per 24 hour  Intake 2098.1 ml  Output 450 ml  Net  1648.1 ml    Filed Weights   10/15/20 1000 10/16/20 0100 10/16/20 1000  Weight: 94 kg 94.1 kg 94.1 kg    Examination:  General exam: NAD Respiratory system: CTA Cardiovascular system: IRR Gastrointestinal system: No significant BS< distend, mild tender.  Central nervous system: alert Extremities: no edema   Data Reviewed: I have personally reviewed following labs and imaging studies  CBC: Recent Labs  Lab 10/11/20 0742 10/12/20 0935 10/13/20 0245 10/14/20 0250 10/15/20 0314 10/16/20 0408 10/17/20 0252  WBC 14.8*   < > 8.0 6.9 6.0 6.4 5.4  NEUTROABS 13.3*  --   --   --   --   --  4.1  HGB 14.7   < > 13.4 13.6 12.5 13.1 12.5  HCT 43.0   < > 39.9 39.7 36.3 38.9 36.9  MCV 91.9   < > 92.8 93.2 91.7 91.7 92.9  PLT 267   < > 197 155 137* 117* 97*   < > = values in this interval not displayed.     Basic Metabolic Panel: Recent Labs  Lab 10/13/20 0245 10/14/20 0250 10/15/20 0314 10/16/20 0408 10/17/20 0252  NA 135 131* 131* 130* 135  K 3.4* 3.9 3.2* 4.0 3.6  CL 98 99 97* 100 106  CO2 26 23 24 24  21*  GLUCOSE 120* 120* 134* 112* 97  BUN 45* 32* 22 19 15   CREATININE 0.64 0.52 0.37* 0.44 0.50  CALCIUM 7.8* 7.8* 7.9* 7.7* 7.9*  MG 2.5*  --  2.0 2.0 1.9     GFR: Estimated Creatinine Clearance: 62.2 mL/min (by C-G formula based on SCr of 0.5 mg/dL).  Liver Function Tests: Recent Labs  Lab 10/13/20 0245 10/14/20 0250 10/15/20 0314 10/16/20 0408 10/17/20 0252  AST 45* 48* 44* 57* 33   ALT 81* 80* 76* 90* 70*  ALKPHOS 58 57 51 63 55  BILITOT 1.3* 1.1 1.1 1.2 1.1  PROT 5.3* 5.4* 5.3* 5.3* 5.4*  ALBUMIN 2.1* 2.2* 2.4* 2.4* 2.6*     CBG: Recent Labs  Lab 10/16/20 1635 10/16/20 2118 10/17/20 0733 10/17/20 1355 10/17/20 1620  GLUCAP 98 94 97 80 88      No results found for this or any previous visit (from the past 240 hour(s)).       Radiology Studies: DG Abd 2 Views  Result Date: 10/16/2020 CLINICAL DATA:  Weakness, abdominal pain EXAM: ABDOMEN - 2 VIEW COMPARISON:  10/14/2020 FINDINGS: Marked gaseous distension of small bowel loops again noted, stable since prior study. No real change. No organomegaly or free air. IMPRESSION: Continued marked gaseous distention of small bowel loops concerning for small bowel obstruction, unchanged. Electronically Signed   By: 10/18/2020 M.D.   On: 10/16/2020 09:48   Charlett Nose EKG SITE RITE  Result Date: 10/16/2020 If Site Rite image not attached, placement could not be confirmed due to current cardiac rhythm.       Scheduled Meds:  bisacodyl  10 mg Rectal BID   budesonide (PULMICORT) nebulizer solution  0.5 mg Nebulization BID   chlorhexidine  15 mL Mouth Rinse BID   Chlorhexidine Gluconate Cloth  6 each Topical Daily   insulin aspart  0-9 Units Subcutaneous TID WC   levalbuterol  1.25 mg Nebulization BID   liver oil-zinc oxide   Topical TID   mouth rinse  15 mL Mouth Rinse q12n4p   pantoprazole (PROTONIX) IV  40 mg Intravenous Q12H   polyethylene glycol  17 g Per Tube TID   Continuous Infusions:  sodium chloride Stopped (10/15/20 2047)   sodium chloride 75 mL/hr at 10/17/20 0938   amiodarone 30 mg/hr (10/17/20 1631)     LOS: 25 days    Time spent: 40 minutes    Kimberly Stoffers A Verbena Boeding, MD [facility]   To contact the attending provider between 7A-7P or the covering provider during after hours 7P-7A, please log into the web site [email or web address] and access using universal [place] password for that  web site. If you do not have the password, please call the hospital operator.  10/17/2020, 5:09 PM

## 2020-10-17 NOTE — Progress Notes (Signed)
SLP Cancellation Note  Patient Details Name: [name] MRN: [id number] DOB: 1940-07-10   Cancelled treatment:       Reason Eval/Treat Not Completed: Other (comment). Given plan of care and recent events, SLP will sign off.    Latandra Loureiro, Riley Nearing 10/17/2020, 7:40 AM

## 2020-10-17 NOTE — Progress Notes (Signed)
Chaplain followed up with patient and her family.  They were grateful to be spending some time together and grateful that other family was on the way in to town.  They reported that they are doing fine at this time and that family time is their focus.  Chaplain Dyanne Carrel, Bcc Pager, [phone] 9:04 PM

## 2020-10-17 NOTE — TOC Progression Note (Signed)
Transition of Care [facility]) - Progression Note    Patient Details  Name: Kimberly Ballard MRN: [id number] Date of Birth: 08/23/1940  Transition of Care [facility]) CM/SW Contact  Golda Acre, RN Phone Number: 10/17/2020, 8:36 AM  Clinical Narrative:    Still complaints of abdominal pain.  Remains in atrial fibrillation with RVr with HR in the low 100's.  Waiting to talk with son regarding comfort care measures  toc plan of care: qAITING DECISION About home hospice versus snf placement  Expected Discharge Plan: Home/Self Care Barriers to Discharge: Continued Medical Work up  Expected Discharge Plan and Services Expected Discharge Plan: Home/Self Care   Discharge Planning Services: CM Consult   Living arrangements for the past 2 months: Single Family Home                                       Social Determinants of Health (SDOH) Interventions    Readmission Risk Interventions No flowsheet data found.

## 2020-10-17 NOTE — Progress Notes (Signed)
Daily Progress Note   Patient Name: Kimberly Ballard       Date: 10/17/2020 DOB: 07-05-1940  Age: 80 y.o. MRN#: [id number] Attending Physician: Alba Cory, MD Primary Care Physician: System, Provider Not In Admit Date: 09/22/2020  Reason for Consultation/Follow-up: Establishing goals of care  Subjective: Kimberly Ballard is lying in bed in no distress, she appears to be resting comfortably.  Discussed with bedside RN, daughter Kimberly Ballard will arrive at the hospital later today, patient has 2 sons who will also try to get up here, overall goals of care are moving in the direction of establishing comfort-focused care as a singular goal.  As such, patient will also benefit mostly from addition of hospice services. See below.     Length of Stay: 25  Current Medications: Scheduled Meds:   bisacodyl  10 mg Rectal BID   budesonide (PULMICORT) nebulizer solution  0.5 mg Nebulization BID   chlorhexidine  15 mL Mouth Rinse BID   Chlorhexidine Gluconate Cloth  6 each Topical Daily   insulin aspart  0-9 Units Subcutaneous TID WC   levalbuterol  1.25 mg Nebulization BID   liver oil-zinc oxide   Topical TID   mouth rinse  15 mL Mouth Rinse q12n4p   pantoprazole (PROTONIX) IV  40 mg Intravenous Q12H   polyethylene glycol  17 g Per Tube TID    Continuous Infusions:  sodium chloride Stopped (10/15/20 2047)   sodium chloride 75 mL/hr at 10/17/20 0938   amiodarone 30 mg/hr (10/17/20 0938)    PRN Meds: sodium chloride, ondansetron (ZOFRAN) IV, oxybutynin, simethicone, sodium chloride           General: Alert, awake,     Heart: Tachycardic.   Lungs: Decreased air movement,   Abdomen: Soft, nontender, nondistended, positive bowel sounds.   Ext: +edema Skin: Warm and dry Neuro: Grossly intact,  nonfocal. O2 via [place] currently.   Vital Signs: BP 121/85   Pulse 75   Temp (!) 97.4 F (36.3 C) (Oral)   Resp 15   Ht 5\' 3"  (1.6 m)   Wt 94.1 kg   SpO2 95%   BMI 36.75 kg/m  SpO2: SpO2: 95 % O2 Device: O2 Device: Nasal Cannula O2 Flow Rate: O2 Flow Rate (L/min): 3 L/min  Intake/output summary:  Intake/Output Summary (Last 24 hours) at  10/17/2020 1219 Last data filed at 10/17/2020 5462 Gross per 24 hour  Intake 2098.1 ml  Output 475 ml  Net 1623.1 ml    LBM: Last BM Date: 10/16/20 Baseline Weight: Weight: 90.9 kg Most recent weight: Weight: 94.1 kg       Palliative Assessment/Data:    Flowsheet Rows    Flowsheet Row Most Recent Value  Intake Tab   Referral Department Hospitalist  Unit at Time of Referral ICU  Palliative Care Primary Diagnosis Sepsis/Infectious Disease  Date Notified 10/09/20  Palliative Care Type New Palliative care  Reason for referral Clarify Goals of Care  Date of Admission 09/22/20  Date first seen by Palliative Care 10/11/20  # of days Palliative referral response time 2 Day(s)  # of days IP prior to Palliative referral 17  Clinical Assessment   Palliative Performance Scale Score 30%  Psychosocial & Spiritual Assessment   Palliative Care Outcomes   Patient/Family meeting held? Yes  [name]       Patient Active Problem List   Diagnosis Date Noted   Pressure injury of skin 10/15/2020   Constipation    SBO (small bowel obstruction) (HCC)    Palliative care by specialist    Goals of care, counseling/discussion    General weakness    Chest pain    Shock circulatory (HCC)    Hypoxemia    Ileus (HCC)    RVF (right ventricular failure) (HCC)    Dyspnea    Acute and chronic respiratory failure (acute-on-chronic) (HCC)    Atrial fibrillation with RVR (HCC)    Elevated troponin 09/23/2020   CAP (community acquired pneumonia) 09/22/2020   Acute respiratory failure with hypoxia and hypercapnia (HCC) 09/22/2020    Septic shock (HCC) 09/22/2020   Elevated LFTs 09/22/2020   Cardiomegaly 09/22/2020    Palliative Care Assessment & Plan   Patient Profile: 80 y.o. female  with past medical history of COPD, A. fib on Eliquis, and former tobacco use admitted on 09/22/2020 with dyspnea, aches and cough after COVID exposure.  She has had a prolonged hospital course after developing progressive respiratory failure from which she has become high flow nasal cannula dependent.  She has been treated for multifocal pneumonia.  She is also intermittently requiring BiPAP.  She has developed generalized ileus.  She has diastolic heart failure and atrial fibrillation on amiodarone and diltiazem.  She has been hypotensive on IV pressors.  She has AKI which is improving.  Unfortunately, she is not making significant gains or improvement.  Palliative consulted for goals of care.  Recommendations/Plan: -Agree with DNR/DNI -Patient with an ongoing escalating symptom burden such as abdominal discomfort, shortness of breath sensation and with recent large bout of coffee-ground emesis earlier today. Abdominal imaging with CT results reviewed again: CT 9/13 with PSBO vs ileus, some mesenteric swirling noted in mid abdomen but contrast reaches to level of rectum which would argue against SBO.   Patient makes her wishes known clearly that she does not want surgery. She does not want intubation and mechanical ventilation.   She was in the process of completing her living will and other documents at her lawyer's office when she had to get admitted to the hospital. She does elect her daughter Kimberly Ballard to be her HCPOA agent. She has 3 children, she has 2 sons. Her husband died couple of months ago, at home, with hospice support.   Appreciate chaplain assistance with getting advanced directives notarized.    Anticipate  patient having further discussions with her entire family and eventually for plans to shift to comfort focused care.   Anticipate transfer to residential hospice after that.  Goals of Care and Additional Recommendations: Limitations on Scope of Treatment: moving towards comfort care.   Code Status:    Code Status Orders  (From admission, onward)           Start     Ordered   09/27/20 0929  Do not attempt resuscitation (DNR)  Continuous        09/27/20 0928           Code Status History     Date Active Date Inactive Code Status Order ID Comments User Context   09/22/2020 2144 09/27/2020 0928 Full Code [id number]  Therisa Doyne, MD Inpatient       Prognosis:  guarded  Discharge Planning: To Be Determined:likely headed towards residential hospice, depending on hospital course and depending on the decisions the patient and her family make  Care plan was discussed with patient and bedside RN. Thank you for allowing the Palliative Medicine Team to assist in the care of this patient.   Total Time 25 Prolonged Time Billed  no       Greater than 50%  of this time was spent counseling and coordinating care related to the above assessment and plan.  Rosalin Hawking, MD  Please contact Palliative Medicine Team phone at [phone] for questions and concerns from 7 AM to 7 PM. After 7 PM, please call primary service.

## 2020-10-17 NOTE — Progress Notes (Signed)
Progress Note  Patient Name: Kimberly Ballard Date of Encounter: 10/17/2020  [facility] Cardiologist: Armanda Magic, MD   Subjective   Still complaints of abdominal pain.  Remains in atrial fibrillation with RVr with HR in the low 100's.  Waiting to talk with son regarding comfort care measures  Inpatient Medications    Scheduled Meds:  bisacodyl  10 mg Rectal BID   budesonide (PULMICORT) nebulizer solution  0.5 mg Nebulization BID   chlorhexidine  15 mL Mouth Rinse BID   Chlorhexidine Gluconate Cloth  6 each Topical Daily   insulin aspart  0-9 Units Subcutaneous TID WC   levalbuterol  1.25 mg Nebulization BID   liver oil-zinc oxide   Topical TID   mouth rinse  15 mL Mouth Rinse q12n4p   pantoprazole (PROTONIX) IV  40 mg Intravenous Q12H   polyethylene glycol  17 g Per Tube TID   Continuous Infusions:  sodium chloride Stopped (10/15/20 2047)   sodium chloride 75 mL/hr at 10/17/20 0736   amiodarone 30 mg/hr (10/17/20 0736)   PRN Meds: sodium chloride, ondansetron (ZOFRAN) IV, oxybutynin, simethicone, sodium chloride   Vital Signs    Vitals:   10/17/20 0600 10/17/20 0615 10/17/20 0800 10/17/20 0810  BP:  119/76    Pulse: (!) 112 (!) 138    Resp: (!) 23 (!) 32    Temp:   (!) 97.4 F (36.3 C)   TempSrc:   Oral   SpO2: 95% 94%  93%  Weight:      Height:        Intake/Output Summary (Last 24 hours) at 10/17/2020 0825 Last data filed at 10/17/2020 0736 Gross per 24 hour  Intake 2548.65 ml  Output 575 ml  Net 1973.65 ml    Last 3 Weights 10/16/2020 10/16/2020 10/15/2020  Weight (lbs) 207 lb 7.3 oz 207 lb 7.3 oz 207 lb 3.7 oz  Weight (kg) 94.1 kg 94.1 kg 94 kg      Telemetry    Atrial fibrillation with RVR- Personally Reviewed  ECG    No new EKG to review Personally Reviewed  Physical Exam   GEN: very ill appearing HEENT: Normal NECK: No JVD; No carotid bruits LYMPHATICS: No lymphadenopathy CARDIAC:irregularly irregular and tachy, no murmurs, rubs,  gallops RESPIRATORY:  decreased BS at bases ABDOMEN: Soft,distended and tender to palpation MUSCULOSKELETAL:  No edema; No deformity  SKIN: Warm and dry NEUROLOGIC:  Alert and oriented x 3 PSYCHIATRIC:  Normal affect   Labs    High Sensitivity Troponin:   Recent Labs  Lab 09/24/20 0239 10/04/20 1721 10/04/20 1923 10/10/20 1225 10/11/20 0742  TROPONINIHS 52* 59* 67* 145* 104*       Chemistry Recent Labs  Lab 10/15/20 0314 10/16/20 0408 10/17/20 0252  NA 131* 130* 135  K 3.2* 4.0 3.6  CL 97* 100 106  CO2 24 24 21*  GLUCOSE 134* 112* 97  BUN 22 19 15   CREATININE 0.37* 0.44 0.50  CALCIUM 7.9* 7.7* 7.9*  PROT 5.3* 5.3* 5.4*  ALBUMIN 2.4* 2.4* 2.6*  AST 44* 57* 33  ALT 76* 90* 70*  ALKPHOS 51 63 55  BILITOT 1.1 1.2 1.1  GFRNONAA >60 >60 >60  ANIONGAP 10 6 8       Hematology Recent Labs  Lab 10/15/20 0314 10/16/20 0408 10/17/20 0252  WBC 6.0 6.4 5.4  RBC 3.96 4.24 3.97  HGB 12.5 13.1 12.5  HCT 36.3 38.9 36.9  MCV 91.7 91.7 92.9  MCH 31.6 30.9 31.5  MCHC 34.4 33.7 33.9  RDW 14.1 14.2 14.6  PLT 137* 117* 97*     BNPNo results for input(s): BNP, PROBNP in the last 168 hours.   DDimer No results for input(s): DDIMER in the last 168 hours.   Radiology    DG Abd 2 Views  Result Date: 10/16/2020 CLINICAL DATA:  Weakness, abdominal pain EXAM: ABDOMEN - 2 VIEW COMPARISON:  10/14/2020 FINDINGS: Marked gaseous distension of small bowel loops again noted, stable since prior study. No real change. No organomegaly or free air. IMPRESSION: Continued marked gaseous distention of small bowel loops concerning for small bowel obstruction, unchanged. Electronically Signed   By: Charlett Nose M.D.   On: 10/16/2020 09:48   [place] EKG SITE RITE  Result Date: 10/16/2020 If Site Rite image not attached, placement could not be confirmed due to current cardiac rhythm.   Cardiac Studies   Complete Echo 09/23/2020: Impressions:  1. Left ventricular ejection fraction, by  estimation, is 55 to 60%. Left  ventricular ejection fraction by 3D volume is 59 %. The left ventricle has  normal function. The left ventricle has no regional wall motion  abnormalities. There is moderate left  ventricular hypertrophy. Left ventricular diastolic parameters are  consistent with Grade I diastolic dysfunction (impaired relaxation).   2. Right ventricular systolic function mildly reduced at base and mid,  with relative preserved apical systolic function. The right ventricular  size is moderately enlarged. There is moderately elevated pulmonary artery  systolic pressure. The estimated  right ventricular systolic pressure is 48.4 mmHg.   3. Left atrial size was mildly dilated.   4. Right atrial size was moderately dilated.   5. The mitral valve is normal in structure. Trivial mitral valve  regurgitation. No evidence of mitral stenosis.   6. Tricuspid valve regurgitation is mild to moderate.   7. The aortic valve is normal in structure. Aortic valve regurgitation is  not visualized. No aortic stenosis is present.   8. The inferior vena cava is dilated in size with <50% respiratory  variability, suggesting right atrial pressure of 15 mmHg.  _______________   Limited Echo with Bubble Study 10/01/2020: Impressions: 1. Left ventricular ejection fraction, by estimation, is 60 to 65%. The  left ventricle has normal function. There is moderate concentric left  ventricular hypertrophy. Left ventricular diastolic function could not be  evaluated.   2. The mitral valve is grossly normal. Trivial mitral valve  regurgitation. No evidence of mitral stenosis.   3. The aortic valve is tricuspid. There is mild calcification of the  aortic valve. Aortic valve regurgitation is trivial. No aortic stenosis is  present.   4. Agitated saline contrast bubble study was negative, with no evidence  of any interatrial shunt.   Conclusion(s)/Recommendation(s): Limited study, no major change from   recent study 09/23/20. Bubble study negative for intraatrial shunt.   Patient Profile     80 y.o. female with a history of COPD with prior tobacco use (quit in 2019) who was admitted on 09/22/2020 for acute hypoxic respiratory failure secondary to COPD exacerbation and multifocal pneumonia after presenting with shortness of breath, body aches, cough, and decreased appetite. Negative for COVID this admission. Cardiology consulted for elevated troponin. Did alter develop atrial fibrillation with RVR and was started on Cardizem and Eliquis with control of rates but not restoration of sinus rhythm Cardiology signed off on 09/26/2020 with plans to have patient follow-up in the A. Fib Clinc and likely DCCV after 304 weeks of anticoagulation.  We were reconsulted on 10/01/2020 given recurrent RVR.  Assessment & Plan    New Onset Atrial Fibrillation with RVR - In the setting of acute hypoxic respiratory failure secondary to COPD exacerbation and pneumonia.  - Potassium 3.6  today.  - Magnesium 1.9 today - TSH normal. - Initially on Cardizem and Lopressor but discontinued due to hypotension requiring pressors.  - currently on IV Amio but HR remains elevated and hypotension limits boluses of Amio to try to control HR - She also received a few doses of IV Digoxin. Last dose on 10/16/2020. - still with HR in the 100-110's - BP good this am so will bolus with Amio 150mg  IV to try to improve HR - Initially started on Eliquis. However, this was held due to hematuria. Hemoglobin stable at 13.6. Treated with CBI.  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  - Most recent chest x-ray on 10/04/2020 showed bilateral lower atelectasis or infiltrates slightly improved from prior study. - Echo showed LVEF of 59% but moderately  enlarged RV with mildly reduced systolic function at the base and mid segments with relatively preserved apical systolic function. This can be seen in acute PE  but chest CTA negative for this. - Initially diuresed with IV Lasix but this was stopped on 9/7. Net negative -6.76 L this admission. I don't think weights are accurate the last few days (200 lbs >> 192 lbs >> 208 lbs >> 207lbs today). Dry weight around 190 lbs. - Does not appear significant volume overloaded on exam.  - Holding on additional Lasix at this time  - Will need to continue to monitor volume status closely - RV dysfunction felt to be secondary to pulmonary hypertension. This is felt to be chronic but worsened in the setting of acute COPD exacerbation and pneumonia.    Hypotension/Shock - Hypotensive in setting of acute hematemasis - BP has stabilized - Phenylephrine weaned off  - IV fluids given this am - Palliative Care has been consulted to help clarify goals of care given little progress.    Elevated Troponin - High-sensitivity troponin mildly elevated and flat. Initially peaked at 128 on 09/22/2020 and then downtrended. Increased again to 145 on 9/9 but again downtrending. - EKG showed T wave abnormality in anterior leads. No prior tracing for comparison. - Echo as above. - Troponin elevation not felt to be ACS and more consistent with demand. She does have coronary calcification on CT so may benefit from outpatient Lexiscan vs coronary CTA if she recovers from acute illness.    AKI - Resolved - Creatinine peaked at 1.69 on 9/10 but has since returned to baseline. Likely due to hypotension. - Creatinine 0.5  today. - Continue to monitor.   Hypokalemia - Potassium 3.6 today.  - replete per TRH to keep > 4 - Continue to monitor.   Otherwise, per primary team: - Acute hypoxic/hypercapnic respiratory failure requiring intermittent BiPAP  - Possible sepsis - COPD Exacerbation - Multifocal pneumonia  - Abdominal  pain - Transamnitis: stable - Hyponatremia - Hematuria: Started on CBI. Urology consulted. - Ileus: S/p NGT placement. General Surgery consulted.  Patient critically ill and wishing to consider comfort care at this time - await discussion with Palliative care team  I have spent a total of 35 minutes with patient  reviewing 2D echo , telemetry, EKGs, labs and examining patient as well as establishing an assessment and plan that was discussed with the patient.  > 50% of time was spent in direct patient care.      For questions or updates, please contact [facility] Please consult [email or web address] for contact info under        Signed, Armanda Magic, MD  10/17/2020, 8:25 AM

## 2020-10-17 NOTE — Progress Notes (Signed)
PT Cancellation Note  Patient Details Name: Kimberly Ballard MRN: [id number] DOB: January 25, 1941   Cancelled Treatment:    Reason Eval/Treat Not Completed: Patient declined, no reason specified   [facility] 10/17/2020, 9:57 AM

## 2020-10-18 DIAGNOSIS — I4891 Unspecified atrial fibrillation: Secondary | ICD-10-CM | POA: Diagnosis not present

## 2020-10-18 DIAGNOSIS — J189 Pneumonia, unspecified organism: Secondary | ICD-10-CM | POA: Diagnosis not present

## 2020-10-18 DIAGNOSIS — J9602 Acute respiratory failure with hypercapnia: Secondary | ICD-10-CM | POA: Diagnosis not present

## 2020-10-18 DIAGNOSIS — K5900 Constipation, unspecified: Secondary | ICD-10-CM | POA: Diagnosis not present

## 2020-10-18 DIAGNOSIS — Z515 Encounter for palliative care: Secondary | ICD-10-CM | POA: Diagnosis not present

## 2020-10-18 DIAGNOSIS — Z7189 Other specified counseling: Secondary | ICD-10-CM | POA: Diagnosis not present

## 2020-10-18 DIAGNOSIS — J9601 Acute respiratory failure with hypoxia: Secondary | ICD-10-CM | POA: Diagnosis not present

## 2020-10-18 DIAGNOSIS — J9620 Acute and chronic respiratory failure, unspecified whether with hypoxia or hypercapnia: Secondary | ICD-10-CM | POA: Diagnosis not present

## 2020-10-18 DIAGNOSIS — R531 Weakness: Secondary | ICD-10-CM | POA: Diagnosis not present

## 2020-10-18 LAB — COMPREHENSIVE METABOLIC PANEL
ALT: 56 U/L — ABNORMAL HIGH (ref 0–44)
AST: 23 U/L (ref 15–41)
Albumin: 2.4 g/dL — ABNORMAL LOW (ref 3.5–5.0)
Alkaline Phosphatase: 56 U/L (ref 38–126)
Anion gap: 5 (ref 5–15)
BUN: 13 mg/dL (ref 8–23)
CO2: 21 mmol/L — ABNORMAL LOW (ref 22–32)
Calcium: 7.8 mg/dL — ABNORMAL LOW (ref 8.9–10.3)
Chloride: 107 mmol/L (ref 98–111)
Creatinine, Ser: 0.44 mg/dL (ref 0.44–1.00)
GFR, Estimated: >60 mL/min (ref >60)
Glucose, Bld: 90 mg/dL (ref 70–99)
Potassium: 3.3 mmol/L — ABNORMAL LOW (ref 3.5–5.1)
Sodium: 133 mmol/L — ABNORMAL LOW (ref 135–145)
Total Bilirubin: 1 mg/dL (ref 0.3–1.2)
Total Protein: 5 g/dL — ABNORMAL LOW (ref 6.5–8.1)

## 2020-10-18 LAB — CBC WITH DIFFERENTIAL/PLATELET
Abs Immature Granulocytes: 0.09 10*3/uL — ABNORMAL HIGH (ref 0.00–0.07)
Basophils Absolute: 0 10*3/uL (ref 0.0–0.1)
Basophils Relative: 0 %
Eosinophils Absolute: 0.3 10*3/uL (ref 0.0–0.5)
Eosinophils Relative: 6 %
HCT: 37.2 % (ref 36.0–46.0)
Hemoglobin: 12.5 g/dL (ref 12.0–15.0)
Immature Granulocytes: 2 %
Lymphocytes Relative: 13 %
Lymphs Abs: 0.7 10*3/uL (ref 0.7–4.0)
MCH: 31 pg (ref 26.0–34.0)
MCHC: 33.6 g/dL (ref 30.0–36.0)
MCV: 92.3 fL (ref 80.0–100.0)
Monocytes Absolute: 0.2 10*3/uL (ref 0.1–1.0)
Monocytes Relative: 4 %
Neutro Abs: 4 10*3/uL (ref 1.7–7.7)
Neutrophils Relative %: 75 %
Platelets: 97 10*3/uL — ABNORMAL LOW (ref 150–400)
RBC: 4.03 MIL/uL (ref 3.87–5.11)
RDW: 14.9 % (ref 11.5–15.5)
WBC: 5.4 10*3/uL (ref 4.0–10.5)
nRBC: 0 % (ref 0.0–0.2)

## 2020-10-18 LAB — MAGNESIUM: Magnesium: 1.8 mg/dL (ref 1.7–2.4)

## 2020-10-18 LAB — GLUCOSE, CAPILLARY
Glucose-Capillary: 89 mg/dL (ref 70–99)
Glucose-Capillary: 93 mg/dL (ref 70–99)

## 2020-10-18 MED ORDER — HYDROMORPHONE HCL 1 MG/ML IJ SOLN: 0.5000 mg | INTRAMUSCULAR | Status: DC | PRN

## 2020-10-18 MED ORDER — LEVALBUTEROL HCL 0.63 MG/3ML IN NEBU: 0.6300 mg | INHALATION_SOLUTION | Freq: Once | RESPIRATORY_TRACT | Status: DC

## 2020-10-18 MED ORDER — MAGNESIUM SULFATE 2 GM/50ML IV SOLN
2.0000 g | Freq: Once | INTRAVENOUS | Status: AC
  Administered 2020-10-18: 2 g via INTRAVENOUS
  Filled 2020-10-18: qty 50

## 2020-10-18 MED ORDER — POTASSIUM CHLORIDE 10 MEQ/100ML IV SOLN
10.0000 meq | INTRAVENOUS | Status: AC
Start: 2020-10-18 — End: 2020-10-18
  Administered 2020-10-18 (×4): 10 meq via INTRAVENOUS
  Filled 2020-10-18 (×4): qty 100

## 2020-10-18 NOTE — Progress Notes (Signed)
PMT additional progress note. Family meeting Goals of Care discussions.   Paged by bedside RN that the entire family is now at bedside and that patient and family wish to further discuss with PMT. Arrived at bedside, patient resting in bed, 2 sons and daughter Thayer Ohm at bedside, other family members also present in the room.   Palliative medicine is specialized medical care for people living with serious illness. It focuses on providing relief from the symptoms and stress of a serious illness. The goal is to improve quality of life for both the patient and the family. Goals of care: Broad aims of medical therapy in relation to the patient's values and preferences. Our aim is to provide medical care aimed at enabling patients to achieve the goals that matter most to them, given the circumstances of their particular medical situation and their constraints.   The patient voiced her wishes and goals. She re iterated her wishes being for establishing full scope of comfort measures, no surgery. We discussed about full scope of comfort measures: D/C labs, D/C interventions not important for comfort. Patient has reflected on the different levels of hospice: she is accepting of a transfer to residential hospice towards the end of this hospitalization.   Medication history reviewed. Will add low dose IV Dilaudid PRN basis for now. Will d/c routine labs and other medications no longer directly responsible for comfort. Ok to have very small sips of clear liquids with supervision and assistance in line with the concept of "comfort feeds".   Anticipate TOC consult and reaching out to hospice liaison regarding residential hospice in the next 1-2 days, depending on the patient's symptom burden and needs.  Appreciate ongoing spiritual care support. Continue to support the patient and family as a unit holistically. PMT to follow.   Additional 30 minutes spent.  Time in 1330 Time out 1400   Greater than 50%  of this  time was spent counseling and coordinating care related to the above assessment and plan. Rosalin Hawking MD [facility].

## 2020-10-18 NOTE — Progress Notes (Signed)
Daily Progress Note   Patient Name: Kimberly Ballard       Date: 10/18/2020 DOB: Jun 10, 1940  Age: 80 y.o. MRN#: [id number] Attending Physician: Rodolph Bong, MD Primary Care Physician: System, Provider Not In Admit Date: 09/22/2020  Reason for Consultation/Follow-up: Establishing goals of care  Subjective: Ms. Kimberly Ballard feels about the same, she denies having severe pain currently. Her 2 sons are at bedside.    See below.     Length of Stay: 26  Current Medications: Scheduled Meds:   bisacodyl  10 mg Rectal BID   budesonide (PULMICORT) nebulizer solution  0.5 mg Nebulization BID   chlorhexidine  15 mL Mouth Rinse BID   Chlorhexidine Gluconate Cloth  6 each Topical Daily   insulin aspart  0-9 Units Subcutaneous TID WC   levalbuterol  1.25 mg Nebulization BID   liver oil-zinc oxide   Topical TID   mouth rinse  15 mL Mouth Rinse q12n4p   pantoprazole (PROTONIX) IV  40 mg Intravenous Q12H   polyethylene glycol  17 g Per Tube TID    Continuous Infusions:  sodium chloride Stopped (10/15/20 2047)   sodium chloride 75 mL/hr at 10/18/20 0851   amiodarone 30 mg/hr (10/18/20 0542)   potassium chloride      PRN Meds: sodium chloride, ondansetron (ZOFRAN) IV, oxybutynin, simethicone, sodium chloride           General: Alert, awake,     Heart: Tachycardic.   Lungs: Decreased air movement,   Abdomen: Soft, nontender, nondistended, positive bowel sounds.   Ext: +edema Skin: Warm and dry Neuro: Grossly intact, nonfocal. O2 via [place] currently.   Vital Signs: BP 101/77   Pulse 95   Temp 98 F (36.7 C) (Oral)   Resp (!) 26   Ht 5\' 3"  (1.6 m)   Wt 94.5 kg   SpO2 92%   BMI 36.90 kg/m  SpO2: SpO2: 92 % O2 Device: O2 Device: Nasal Cannula O2 Flow Rate: O2 Flow Rate (L/min): 3  L/min  Intake/output summary:  Intake/Output Summary (Last 24 hours) at 10/18/2020 1253 Last data filed at 10/18/2020 0300 Gross per 24 hour  Intake 1257.62 ml  Output 350 ml  Net 907.62 ml   LBM: Last BM Date: 10/17/20 Baseline Weight: Weight: 90.9 kg Most recent weight: Weight: 94.5 kg  Palliative Assessment/Data:    Flowsheet Rows    Flowsheet Row Most Recent Value  Intake Tab   Referral Department Hospitalist  Unit at Time of Referral ICU  Palliative Care Primary Diagnosis Sepsis/Infectious Disease  Date Notified 10/09/20  Palliative Care Type New Palliative care  Reason for referral Clarify Goals of Care  Date of Admission 09/22/20  Date first seen by Palliative Care 10/11/20  # of days Palliative referral response time 2 Day(s)  # of days IP prior to Palliative referral 17  Clinical Assessment   Palliative Performance Scale Score 30%  Psychosocial & Spiritual Assessment   Palliative Care Outcomes   Patient/Family meeting held? Yes  [name]       Patient Active Problem List   Diagnosis Date Noted   Pressure injury of skin 10/15/2020   Constipation    SBO (small bowel obstruction) (HCC)    Palliative care by specialist    Goals of care, counseling/discussion    General weakness    Chest pain    Shock circulatory (HCC)    Hypoxemia    Ileus (HCC)    RVF (right ventricular failure) (HCC)    Dyspnea    Acute and chronic respiratory failure (acute-on-chronic) (HCC)    Atrial fibrillation with RVR (HCC)    Elevated troponin 09/23/2020   CAP (community acquired pneumonia) 09/22/2020   Acute respiratory failure with hypoxia and hypercapnia (HCC) 09/22/2020   Septic shock (HCC) 09/22/2020   Elevated LFTs 09/22/2020   Cardiomegaly 09/22/2020    Palliative Care Assessment & Plan   Patient Profile: 80 y.o. female  with past medical history of COPD, A. fib on Eliquis, and former tobacco use admitted on 09/22/2020 with dyspnea,  aches and cough after COVID exposure.  She has had a prolonged hospital course after developing progressive respiratory failure from which she has become high flow nasal cannula dependent.  She has been treated for multifocal pneumonia.  She is also intermittently requiring BiPAP.  She has developed generalized ileus.  She has diastolic heart failure and atrial fibrillation on amiodarone and diltiazem.  She has been hypotensive on IV pressors.  She has AKI which is improving.  Unfortunately, she is not making significant gains or improvement.  Palliative consulted for goals of care.  Recommendations/Plan: -Agree with DNR/DNI -Patient with an ongoing escalating symptom burden such as abdominal discomfort, shortness of breath sensation and with recent large bout of coffee-ground emesis earlier today. Abdominal imaging with CT results reviewed again: CT 9/13 with PSBO vs ileus, some mesenteric swirling noted in mid abdomen but contrast reaches to level of rectum which would argue against SBO.   Patient makes her wishes known clearly that she does not want surgery. She does not want intubation and mechanical ventilation.   She was in the process of completing her living will and other documents at her lawyer's office when she had to get admitted to the hospital. She does elect her daughter Kimberly Ballard to be her HCPOA agent. She has 3 children, she has 2 sons. Her husband died couple of months ago, at home, with hospice support.   Appreciate chaplain assistance with getting advanced directives notarized.    Discussed with patient and 2 sons at bedside. Patient's son states that it is very important to the patient to be in her home, even if it is for a short while. We talked about comfort care, and the differences between home with hospice versus residential hospice were compared and  contrasted. Patient and family to further discuss about her goals and wishes.    Goals of Care and Additional  Recommendations: Limitations on Scope of Treatment: moving towards comfort care.   Code Status:    Code Status Orders  (From admission, onward)           Start     Ordered   09/27/20 0929  Do not attempt resuscitation (DNR)  Continuous        09/27/20 0928           Code Status History     Date Active Date Inactive Code Status Order ID Comments User Context   09/22/2020 2144 09/27/2020 0928 Full Code [id number]  Therisa Doyne, MD Inpatient       Prognosis:  guarded  Discharge Planning: Home with hospice versus residential hospice depending on patient's wishes.    Care plan was discussed with patient and her 2 sons at bedside.   Thank you for allowing the Palliative Medicine Team to assist in the care of this patient.   Total Time 35 Prolonged Time Billed  no       Greater than 50%  of this time was spent counseling and coordinating care related to the above assessment and plan.  Rosalin Hawking, MD  Please contact Palliative Medicine Team phone at [phone] for questions and concerns from 7 AM to 7 PM. After 7 PM, please call primary service.

## 2020-10-18 NOTE — Progress Notes (Signed)
Progress Note  Patient Name: Kimberly Ballard Date of Encounter: 10/18/2020  [facility] Cardiologist: Fransico Him, MD   Subjective   Abdominal fullness -afib is generally rate-controlled on IV amiodarone. Son coming in from [place] today to visit.  Inpatient Medications    Scheduled Meds:  bisacodyl  10 mg Rectal BID   budesonide (PULMICORT) nebulizer solution  0.5 mg Nebulization BID   chlorhexidine  15 mL Mouth Rinse BID   Chlorhexidine Gluconate Cloth  6 each Topical Daily   insulin aspart  0-9 Units Subcutaneous TID WC   levalbuterol  1.25 mg Nebulization BID   liver oil-zinc oxide   Topical TID   mouth rinse  15 mL Mouth Rinse q12n4p   pantoprazole (PROTONIX) IV  40 mg Intravenous Q12H   polyethylene glycol  17 g Per Tube TID   Continuous Infusions:  sodium chloride Stopped (10/15/20 2047)   sodium chloride 75 mL/hr at 10/18/20 0851   amiodarone 30 mg/hr (10/18/20 0542)   PRN Meds: sodium chloride, ondansetron (ZOFRAN) IV, oxybutynin, simethicone, sodium chloride   Vital Signs    Vitals:   10/18/20 0400 10/18/20 0410 10/18/20 0500 10/18/20 0600  BP: 107/70  102/74 101/77  Pulse: 69  (!) 105 95  Resp: 15  20 (!) 26  Temp:  (!) 96.4 F (35.8 C)    TempSrc:  Axillary    SpO2: 95%  93% 92%  Weight:      Height:        Intake/Output Summary (Last 24 hours) at 10/18/2020 0910 Last data filed at 10/18/2020 0300 Gross per 24 hour  Intake 1443.88 ml  Output 350 ml  Net 1093.88 ml   Last 3 Weights 10/16/2020 10/16/2020 10/15/2020  Weight (lbs) 207 lb 7.3 oz 207 lb 7.3 oz 207 lb 3.7 oz  Weight (kg) 94.1 kg 94.1 kg 94 kg      Telemetry    Atrial fibrillation with CVR Personally Reviewed  ECG   N/A  Physical Exam   GEN: very ill appearing HEENT: Normal NECK: No JVD; No carotid bruits LYMPHATICS: No lymphadenopathy CARDIAC:irregularly irregular and tachy, no murmurs, rubs, gallops RESPIRATORY:  decreased BS at bases ABDOMEN: Soft,distended and tender  to palpation MUSCULOSKELETAL:  1+ bilateral LE edema; No deformity  SKIN: Warm and dry NEUROLOGIC:  Alert and oriented x 3 PSYCHIATRIC:  Normal affect    Labs    High Sensitivity Troponin:   Recent Labs  Lab 09/24/20 0239 10/04/20 1721 10/04/20 1923 10/10/20 1225 10/11/20 0742  TROPONINIHS 52* 59* 67* 145* 104*      Chemistry Recent Labs  Lab 10/15/20 0314 10/16/20 0408 10/17/20 0252  NA 131* 130* 135  K 3.2* 4.0 3.6  CL 97* 100 106  CO2 24 24 21*  GLUCOSE 134* 112* 97  BUN _0 CREATININE 0.37* 0.44 0.50  CALCIUM 7.9* 7.7* 7.9*  PROT 5.3* 5.3* 5.4*  ALBUMIN 2.4* 2.4* 2.6*  AST 44* 57* 33  ALT 76* 90* 70*  ALKPHOS 51 63 55  BILITOT 1.1 1.2 1.1  GFRNONAA >60 >60 >60  ANIONGAP _1 Hematology Recent Labs  Lab 10/16/20 0408 10/17/20 0252 10/18/20 0849  WBC 6.4 5.4 5.4  RBC 4.24 3.97 4.03  HGB 13.1 12.5 12.5  HCT 38.9 36.9 37.2  MCV 91.7 92.9 92.3  MCH 30.9 31.5 31.0  MCHC 33.7 33.9 33.6  RDW 14.2 14.6 14.9  PLT 117* 97* 97*    BNPNo results for  input(s): BNP, PROBNP in the last 168 hours.   DDimer No results for input(s): DDIMER in the last 168 hours.   Radiology    DG Abd 2 Views  Result Date: 10/16/2020 CLINICAL DATA:  Weakness, abdominal pain EXAM: ABDOMEN - 2 VIEW COMPARISON:  10/14/2020 FINDINGS: Marked gaseous distension of small bowel loops again noted, stable since prior study. No real change. No organomegaly or free air. IMPRESSION: Continued marked gaseous distention of small bowel loops concerning for small bowel obstruction, unchanged. Electronically Signed   By: Rolm Baptise M.D.   On: 10/16/2020 09:48   [place] EKG SITE RITE  Result Date: 10/16/2020 If Site Rite image not attached, placement could not be confirmed due to current cardiac rhythm.   Cardiac Studies   Complete Echo 09/23/2020: Impressions:  1. Left ventricular ejection fraction, by estimation, is 55 to 60%. Left  ventricular ejection fraction by 3D volume is  59 %. The left ventricle has  normal function. The left ventricle has no regional wall motion  abnormalities. There is moderate left  ventricular hypertrophy. Left ventricular diastolic parameters are  consistent with Grade I diastolic dysfunction (impaired relaxation).   2. Right ventricular systolic function mildly reduced at base and mid,  with relative preserved apical systolic function. The right ventricular  size is moderately enlarged. There is moderately elevated pulmonary artery  systolic pressure. The estimated  right ventricular systolic pressure is 66.4 mmHg.   3. Left atrial size was mildly dilated.   4. Right atrial size was moderately dilated.   5. The mitral valve is normal in structure. Trivial mitral valve  regurgitation. No evidence of mitral stenosis.   6. Tricuspid valve regurgitation is mild to moderate.   7. The aortic valve is normal in structure. Aortic valve regurgitation is  not visualized. No aortic stenosis is present.   8. The inferior vena cava is dilated in size with <50% respiratory  variability, suggesting right atrial pressure of 15 mmHg.  _______________   Limited Echo with Bubble Study 10/01/2020: Impressions: 1. Left ventricular ejection fraction, by estimation, is 60 to 65%. The  left ventricle has normal function. There is moderate concentric left  ventricular hypertrophy. Left ventricular diastolic function could not be  evaluated.   2. The mitral valve is grossly normal. Trivial mitral valve  regurgitation. No evidence of mitral stenosis.   3. The aortic valve is tricuspid. There is mild calcification of the  aortic valve. Aortic valve regurgitation is trivial. No aortic stenosis is  present.   4. Agitated saline contrast bubble study was negative, with no evidence  of any interatrial shunt.   Conclusion(s)/Recommendation(s): Limited study, no major change from  recent study 09/23/20. Bubble study negative for intraatrial shunt.   Patient  Profile     80 y.o. female with a history of COPD with prior tobacco use (quit in 2019) who was admitted on 09/22/2020 for acute hypoxic respiratory failure secondary to COPD exacerbation and multifocal pneumonia after presenting with shortness of breath, body aches, cough, and decreased appetite. Negative for COVID this admission. Cardiology consulted for elevated troponin. Did alter develop atrial fibrillation with RVR and was started on Cardizem and Eliquis with control of rates but not restoration of sinus rhythm Cardiology signed off on 09/26/2020 with plans to have patient follow-up in the [facility] and likely DCCV after 304 weeks of anticoagulation. We were reconsulted on 10/01/2020 given recurrent RVR.  Assessment & Plan    New Onset Atrial Fibrillation with RVR -  In the setting of acute hypoxic respiratory failure secondary to COPD exacerbation and pneumonia.  - currently on IV Amio but HR remains elevated and hypotension limits boluses of Amio to try to control HR - She also received a few doses of IV Digoxin. Last dose on 10/16/2020. 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  - Most recent chest x-ray on 10/04/2020 showed bilateral lower atelectasis or infiltrates slightly improved from prior study. - Echo showed LVEF of 59% but moderately enlarged RV with mildly reduced systolic function at the base and mid segments with relatively preserved apical systolic function. This can be seen in acute PE  but chest CTA negative for this. - Initially diuresed with IV Lasix but this was stopped on 9/7. Net negative -6.76 L this admission. I don't think weights are accurate the last few days (200 lbs >> 192 lbs >> 208 lbs >> 207lbs today). Dry weight around 190 lbs. - Noted  to develop some lower extremity edema. - Holding on additional Lasix at this time - RV dysfunction felt to be secondary to pulmonary hypertension. This is felt to be chronic but worsened in the setting of acute COPD exacerbation and pneumonia.    Hypotension/Shock - Hypotensive in setting of acute hematemasis - BP has stabilized - Phenylephrine weaned off  - Palliative Care has been consulted to help clarify goals of care given little progress   Elevated Troponin - High-sensitivity troponin mildly elevated and flat. Initially peaked at 128 on 09/22/2020 and then downtrended. Increased again to 145 on 9/9 but again downtrending. - EKG showed T wave abnormality in anterior leads. No prior tracing for comparison. - Echo as above. - Troponin elevation not felt to be ACS and more consistent with demand. She does have coronary calcification on CT so may benefit from outpatient Lexiscan vs coronary CTA if she recovers from acute illness.    AKI - Resolved - Creatinine peaked at 1.69 on 9/10 but has since returned to baseline. Likely due to hypotension. - Creatinine 0.5  yesterday, labs pending  Hypokalemia - Labs pending today   Otherwise, per primary team: - Acute hypoxic/hypercapnic respiratory failure requiring intermittent BiPAP  - Possible sepsis - COPD Exacerbation - Multifocal pneumonia  - Abdominal pain - Transamnitis: stable - Hyponatremia - Hematuria: Started on CBI. Urology consulted. - Ileus: S/p NGT placement. General Surgery consulted.  Patient critically ill and wishing to consider comfort care at this time - has met with palliative, family enroute to the hospital.   For questions or updates, please contact [facility] Please consult [email or web address] for contact info under   Pixie Casino, MD, FACC, [place] Director of the [facility] Diplomate of the American Board of  Clinical Lipidology Attending Cardiologist  Direct Dial: [phone]  Fax: [phone]  Website:  [email or web address]  Pixie Casino, MD  10/18/2020, 9:10 AM

## 2020-10-18 NOTE — Plan of Care (Signed)
Discussed with patient plan of care for the evening, pain management and comfort care with some teach back displayed at this time  Problem: Pain Managment: Goal: General experience of comfort will improve Outcome: Progressing

## 2020-10-18 NOTE — Progress Notes (Signed)
PROGRESS NOTE    Kimberly Ballard  JEH:[id number] DOB: 1940-05-28 DOA: 09/22/2020 PCP: System, Provider Not In    Chief Complaint  Patient presents with   Shortness of Breath    Brief Narrative:  Kimberly Ballard is an 80 y.o. female with a history of COPD presented with shortness of breath.  hypoxic respiratory failure.  Reported COVID exposure but tested negative.  CTA chest ruled out pulmonary embolism but showed bilateral infiltrates, hospital course complicated by multiple issues including AKI, hematuria, a fib with RVR and ileus/obstruction. Multiple subspecialty have been following her, she does not appear to make any progress, palliative care consulted as well for goals of care discussion.  Plan for 9/13: repeat CT Scan.       Assessment & Plan:   Principal Problem:   Acute respiratory failure with hypoxia and hypercapnia (HCC) Active Problems:   Atrial fibrillation with RVR (HCC)   Ileus (HCC)   CAP (community acquired pneumonia)   Septic shock (HCC)   Elevated LFTs   Cardiomegaly   Elevated troponin   Acute and chronic respiratory failure (acute-on-chronic) (HCC)   Dyspnea   Hypoxemia   RVF (right ventricular failure) (HCC)   Shock circulatory (HCC)   Chest pain   Palliative care by specialist   Goals of care, counseling/discussion   General weakness   Pressure injury of skin   Constipation   SBO (small bowel obstruction) (HCC)  1 acute hypoxemic/hypercapnic respiratory failure with history of COPD -On presentation patient initially required BiPAP however currently on high flow nasal cannula at 3 L with sats of 91%. -CT angiogram chest done negative for PE, however concerning for multifocal pneumonia. -COVID-19 screening negative and serology negative, respiratory viral panel negative, autoimmune panel largely unremarkable, blood cultures with no growth to date. -Patient initially was treated with IV Rocephin and azithromycin and steroids with repeat chest x-ray  showing no significant improvement. -Procalcitonin noted to be elevated on 9/9 of 2.6 and patient placed on Vanco and cefepime. -Vancomycin discontinued. -Patient was seen and followed by PCCM, Dr. Marchelle Gearing who recommended cefepime x7 days. -Patient with some wheezing noted on exam this morning. -We will saline lock IV fluids. -Continue Pulmicort nebs, Xopenex nebs, IV antibiotics.  2.  Severe constipation versus ileus versus small bowel obstruction -Patient seen in consultation by GI and general surgery was on NG tube for decompression which was subsequently removed this morning per general surgery. -Patient noted to have large loose stools 10/15/2020 -NG tube removed yesterday patient with trial of clear liquids however seems not to have tolerated it as patient had a large bout of coffee-ground emesis today per RN. -GI and general surgery following. -Concern for nutritional status, TPN being considered by general surgery however patient considering transitioning to comfort measures and as such TPN on hold for now until palliative can reassess patient for goals of care. -Abdominal films from 10/16/2020 with continued marked gaseous distention of the small bowel loops concerning for small bowel obstruction unchanged. -Patient back to n.p.o. status. -Per GI and general surgery. -Patient being followed by palliative care and considering transitioning to comfort measures.  3.  A. fib with RVR and hypotension/shock -Was on Neo-Synephrine drip which has subsequently been discontinued. -Patient received a dose of IV albumin on 10/15/2020, another dose of IV albumin ordered per PCCM today due to systolic blood pressures in the 80s.  -Currently on amiodarone drip per cardiology. -Daily EKG ordered per cardiology to follow-up on QTC as patient on amiodarone. -  Amiodarone 150 mg bolus given on 10/15/2020 and patient currently on amiodarone drip . -Heart rate in the 100s to 110s. -Was on IV heparin for  anticoagulation which is currently being held due to concerns of coffee-ground emesis this morning. -Per cardiology.  4.  Acute diastolic CHF/pulmonary hypertension -Due to soft borderline blood pressures diuretics on hold.  -Patient does not look volume overloaded on examination. -Patient however with some expiratory wheezes. -BNP noted to be elevated on presentation at 889 and trended down to 430. -2D echo with EF of 59%, moderately enlarged RV with mildly reduced systolic function at the base and mid segments with relatively preserved apical systolic function.- -CT angiogram chest negative for PE. -Urine output of 400 cc recorded over the past 24 hours noted. -Current weight of 208.34 pounds. -Cardiology recommending repeat 2D echo once respiratory issues have resolved. -Patient with coffee-ground emesis this morning, also noted to have systolic blood pressures in the 80s and placed on gentle hydration.   -Due to expiratory wheezes we will discontinue IV fluids and placed on nebs.  -Status post IV albumin per PCCM during this hospitalization. -Per cardiology.  5.  Elevated troponin -Per cardiology likely secondary to demand ischemia. -Troponins mildly elevated peaked and trended down. -EKG changes of T wave abnormality in anterior leads noted. -2D echo with  EF of 59%, moderately enlarged RV with mildly reduced systolic function at the base and mid segments with relatively preserved apical systolic function.- -Per cardiology coronary calcification noted on CT scan and recommending outpatient Lexiscan versus coronary CTA once patient has recovered from acute illness. -Per cardiology.  6.  Gross hematuria -Seen in consultation by urology and patient underwent CBI. -Currently off CBI with resolution of hematuria. -Was on IV heparin which was discontinued due to coffee-ground emesis.   7.  Yeast in urine -Status post full course Diflucan.  8.  Acute kidney injury on chronic kidney  disease stage II -Resolved. -Avoid nephrotoxins, hypotension.  9.  Right middle lobe nodule -Repeat CT scan in 3 months.  10.  Hypokalemia -Potassium at 3.3.   -KCl 10 mEq IV x4 runs.  11.  Pressure injury, poa Pressure Injury 10/15/20 Buttocks Left;Medial;Upper Stage 2 -  Partial thickness loss of dermis presenting as a shallow open injury with a red, pink wound bed without slough. pink, red, yellow (Active)  10/15/20 1119  Location: Buttocks  Location Orientation: Left;Medial;Upper  Staging: Stage 2 -  Partial thickness loss of dermis presenting as a shallow open injury with a red, pink wound bed without slough.  Wound Description (Comments): pink, red, yellow  Present on Admission: No     Pressure Injury 10/15/20 Buttocks Left;Medial;Lower Stage 2 -  Partial thickness loss of dermis presenting as a shallow open injury with a red, pink wound bed without slough. red, yellow, pink (Active)  10/15/20 1120  Location: Buttocks  Location Orientation: Left;Medial;Lower  Staging: Stage 2 -  Partial thickness loss of dermis presenting as a shallow open injury with a red, pink wound bed without slough.  Wound Description (Comments): red, yellow, pink  Present on Admission: No     Pressure Injury 10/15/20 Buttocks Right;Medial Stage 2 -  Partial thickness loss of dermis presenting as a shallow open injury with a red, pink wound bed without slough. pink, red, yellow (Active)  10/15/20 1120  Location: Buttocks  Location Orientation: Right;Medial  Staging: Stage 2 -  Partial thickness loss of dermis presenting as a shallow open injury with a red, pink wound  bed without slough.  Wound Description (Comments): pink, red, yellow  Present on Admission: No   12.  Goals of care -Patient with poor prognosis.  Patient not progressing or improving clinically.  Patient with another setback with large coffee-ground emesis this morning.  Patient has been reassessed by palliative care and patient  discussing with family to decide whether to transition to full comfort measures at this time.  Palliative care following.         DVT prophylaxis: Heparin discontinued due to coffee-ground emesis. Code Status: DNR Family Communication: Updated patient.  Updated son at bedside.  Disposition:   Status is: Inpatient  Remains inpatient appropriate because:Inpatient level of care appropriate due to severity of illness  Dispo: The patient is from: Home              Anticipated d/c is to:  TBD              Patient currently is not medically stable to d/c.   Difficult to place patient No       Consultants:  Cardiology: Dr. Mayford Knife 09/24/2020 PCCM: Dr. Marchelle Gearing 09/23/2020 Urology: Dr. Alvester Morin 10/01/2020 -Wound care RN General surgery: Dr. Daphine Deutscher 10/07/2020 Gastroenterology: Dr. Levora Angel 10/10/2020 Palliative care: Dr. Neale Burly 19 2022       Procedures:  CT angiogram chest 09/22/2020, 09/24/2020 CT abdomen and pelvis 09/30/2020, 10/08/2020, 10/14/2020 Abdominal films 10/16/2020, 10/14/2020, 10/13/2020, 10/12/2020, 10/10/2020, 10/09/2020, 10/08/2020, 10/07/2020, 09/30/2020, Chest x-ray 09/25/2020, 09/26/2020, 09/27/2020, 09/29/2020, 10/04/2020, 10/10/2020 Abdominal ultrasound 09/23/2020, 2D echo 09/23/2020, 10/01/2020 Lower extremity Dopplers 09/24/2020  Antimicrobials:  Anti-infectives (From admission, onward)    Start     Dose/Rate Route Frequency Ordered Stop   10/14/20 0815  ceFEPIme (MAXIPIME) 2 g in sodium chloride 0.9 % 100 mL IVPB        2 g 200 mL/hr over 30 Minutes Intravenous Every 8 hours 10/14/20 0718 10/16/20 2200   10/12/20 1200  ceFEPIme (MAXIPIME) 2 g in sodium chloride 0.9 % 100 mL IVPB  Status:  Discontinued        2 g 200 mL/hr over 30 Minutes Intravenous Every 12 hours 10/12/20 1133 10/14/20 0718   10/10/20 2000  ceFEPIme (MAXIPIME) 2 g in sodium chloride 0.9 % 100 mL IVPB  Status:  Discontinued        2 g 200 mL/hr over 30 Minutes Intravenous Every 24 hours 10/10/20 1812 10/12/20 1133    10/10/20 1930  vancomycin (VANCOREADY) IVPB 1500 mg/300 mL        1,500 mg 150 mL/hr over 120 Minutes Intravenous  Once 10/10/20 1812 10/10/20 2045   10/10/20 1838  vancomycin variable dose per unstable renal function (pharmacist dosing)  Status:  Discontinued         Does not apply See admin instructions 10/10/20 1838 10/12/20 0834   10/09/20 1000  fluconazole (DIFLUCAN) tablet 200 mg        200 mg Per Tube Daily 10/09/20 0826 10/10/20 0903   10/08/20 1000  fluconazole (DIFLUCAN) tablet 200 mg  Status:  Discontinued        200 mg Oral Daily 10/07/20 1426 10/09/20 0826   10/03/20 1700  fluconazole (DIFLUCAN) tablet 200 mg  Status:  Discontinued        200 mg Oral Daily 10/03/20 1606 10/07/20 1426   09/23/20 1800  cefTRIAXone (ROCEPHIN) 2 g in sodium chloride 0.9 % 100 mL IVPB        2 g 200 mL/hr over 30 Minutes Intravenous Every 24 hours 09/22/20 1842  09/29/20 1749   09/23/20 1800  azithromycin (ZITHROMAX) 500 mg in sodium chloride 0.9 % 250 mL IVPB  Status:  Discontinued        500 mg 250 mL/hr over 60 Minutes Intravenous Every 24 hours 09/22/20 1842 09/25/20 1456   09/22/20 1630  cefTRIAXone (ROCEPHIN) 1 g in sodium chloride 0.9 % 100 mL IVPB        1 g 200 mL/hr over 30 Minutes Intravenous  Once 09/22/20 1618 09/22/20 1806   09/22/20 1630  azithromycin (ZITHROMAX) 500 mg in sodium chloride 0.9 % 250 mL IVPB        500 mg 250 mL/hr over 60 Minutes Intravenous  Once 09/22/20 1618 09/22/20 1939         Subjective: Laying in bed.  Son at bedside.  Denies any chest pain.  Denies any significant shortness of breath.  Complaining abdominal bloating/fullness.  Stated had liquid bowel movement overnight.  Denies any further coffee-ground emesis or emesis.  States no significant change over the past 24 hours.   Objective: Vitals:   10/18/20 0500 10/18/20 0600 10/18/20 0730 10/18/20 1000  BP: 102/74 101/77    Pulse: (!) 105 95    Resp: 20 (!) 26    Temp:   97.7 F (36.5 C)    TempSrc:   Oral   SpO2: 93% 92%    Weight:    94.5 kg  Height:        Intake/Output Summary (Last 24 hours) at 10/18/2020 1102 Last data filed at 10/18/2020 0300 Gross per 24 hour  Intake 1257.62 ml  Output 350 ml  Net 907.62 ml    Filed Weights   10/16/20 0100 10/16/20 1000 10/18/20 1000  Weight: 94.1 kg 94.1 kg 94.5 kg    Examination:  General exam: NAD. Respiratory system: Some minimal to mild expiratory wheezing anterior lung fields.  No crackles.  No rhonchi.  Fair air movement.  Cardiovascular system: Irregularly irregular.  No JVD, no murmurs rubs or gallops.  No lower extremity edema.   Gastrointestinal system: Abdomen is distended, soft, hypoactive bowel sounds, nontender to palpation however some discomfort to palpation.  No rebound.  No guarding.   Central nervous system: Alert and oriented. No focal neurological deficits. Extremities: Symmetric 5 x 5 power. Skin: No rashes, lesions or ulcers Psychiatry: Judgement and insight appear fair. Mood & affect appropriate.     Data Reviewed: I have personally reviewed following labs and imaging studies  CBC: Recent Labs  Lab 10/14/20 0250 10/15/20 0314 10/16/20 0408 10/17/20 0252 10/18/20 0849  WBC 6.9 6.0 6.4 5.4 5.4  NEUTROABS  --   --   --  4.1 4.0  HGB 13.6 12.5 13.1 12.5 12.5  HCT 39.7 36.3 38.9 36.9 37.2  MCV 93.2 91.7 91.7 92.9 92.3  PLT 155 137* 117* 97* 97*     Basic Metabolic Panel: Recent Labs  Lab 10/13/20 0245 10/14/20 0250 10/15/20 0314 10/16/20 0408 10/17/20 0252 10/18/20 0849  NA 135 131* 131* 130* 135 133*  K 3.4* 3.9 3.2* 4.0 3.6 3.3*  CL 98 99 97* 100 106 107  CO2 26 23 24 24  21* 21*  GLUCOSE 120* 120* 134* 112* 97 90  BUN 45* 32* 22 19 15 13   CREATININE 0.64 0.52 0.37* 0.44 0.50 0.44  CALCIUM 7.8* 7.8* 7.9* 7.7* 7.9* 7.8*  MG 2.5*  --  2.0 2.0 1.9 1.8     GFR: Estimated Creatinine Clearance: 62.3 mL/min (by C-G formula based on SCr of  0.44 mg/dL).  Liver Function  Tests: Recent Labs  Lab 10/14/20 0250 10/15/20 0314 10/16/20 0408 10/17/20 0252 10/18/20 0849  AST 48* 44* 57* 33 23  ALT 80* 76* 90* 70* 56*  ALKPHOS 57 51 63 55 56  BILITOT 1.1 1.1 1.2 1.1 1.0  PROT 5.4* 5.3* 5.3* 5.4* 5.0*  ALBUMIN 2.2* 2.4* 2.4* 2.6* 2.4*     CBG: Recent Labs  Lab 10/17/20 0733 10/17/20 1355 10/17/20 1620 10/17/20 2155 10/18/20 0803  GLUCAP 97 80 88 86 89      No results found for this or any previous visit (from the past 240 hour(s)).       Radiology Studies: No results found.      Scheduled Meds:  bisacodyl  10 mg Rectal BID   budesonide (PULMICORT) nebulizer solution  0.5 mg Nebulization BID   chlorhexidine  15 mL Mouth Rinse BID   Chlorhexidine Gluconate Cloth  6 each Topical Daily   insulin aspart  0-9 Units Subcutaneous TID WC   levalbuterol  1.25 mg Nebulization BID   liver oil-zinc oxide   Topical TID   mouth rinse  15 mL Mouth Rinse q12n4p   pantoprazole (PROTONIX) IV  40 mg Intravenous Q12H   polyethylene glycol  17 g Per Tube TID   Continuous Infusions:  sodium chloride Stopped (10/15/20 2047)   sodium chloride 75 mL/hr at 10/18/20 0851   amiodarone 30 mg/hr (10/18/20 0542)   magnesium sulfate bolus IVPB     potassium chloride       LOS: 26 days    Time spent: 35 minutes    Ramiro Harvest, MD [facility]   To contact the attending provider between 7A-7P or the covering provider during after hours 7P-7A, please log into the web site [email or web address] and access using universal [place] password for that web site. If you do not have the password, please call the hospital operator.  10/18/2020, 11:02 AM

## 2020-10-19 DIAGNOSIS — R0600 Dyspnea, unspecified: Secondary | ICD-10-CM | POA: Diagnosis not present

## 2020-10-19 DIAGNOSIS — J9620 Acute and chronic respiratory failure, unspecified whether with hypoxia or hypercapnia: Secondary | ICD-10-CM | POA: Diagnosis not present

## 2020-10-19 DIAGNOSIS — J9602 Acute respiratory failure with hypercapnia: Secondary | ICD-10-CM | POA: Diagnosis not present

## 2020-10-19 DIAGNOSIS — J9601 Acute respiratory failure with hypoxia: Secondary | ICD-10-CM | POA: Diagnosis not present

## 2020-10-19 DIAGNOSIS — R140 Abdominal distension (gaseous): Secondary | ICD-10-CM

## 2020-10-19 DIAGNOSIS — R531 Weakness: Secondary | ICD-10-CM | POA: Diagnosis not present

## 2020-10-19 DIAGNOSIS — I509 Heart failure, unspecified: Secondary | ICD-10-CM

## 2020-10-19 DIAGNOSIS — J189 Pneumonia, unspecified organism: Secondary | ICD-10-CM | POA: Diagnosis not present

## 2020-10-19 DIAGNOSIS — I4891 Unspecified atrial fibrillation: Secondary | ICD-10-CM | POA: Diagnosis not present

## 2020-10-19 MED ORDER — ONDANSETRON HCL 4 MG/2ML IJ SOLN: 4.0000 mg | Freq: Four times a day (QID) | INTRAMUSCULAR | Status: DC | PRN

## 2020-10-19 MED ORDER — ACETAMINOPHEN 325 MG PO TABS: 650.0000 mg | ORAL_TABLET | Freq: Four times a day (QID) | ORAL | Status: DC | PRN

## 2020-10-19 MED ORDER — DIPHENHYDRAMINE HCL 50 MG/ML IJ SOLN: 12.5000 mg | INTRAMUSCULAR | Status: DC | PRN

## 2020-10-19 MED ORDER — MAGIC MOUTHWASH W/LIDOCAINE
15.0000 mL | Freq: Four times a day (QID) | ORAL | Status: DC | PRN
  Filled 2020-10-19: qty 15

## 2020-10-19 MED ORDER — LORAZEPAM 2 MG/ML PO CONC: 1.0000 mg | ORAL | Status: DC | PRN

## 2020-10-19 MED ORDER — MORPHINE SULFATE (CONCENTRATE) 10 MG/0.5ML PO SOLN: 5.0000 mg | ORAL | Status: DC | PRN

## 2020-10-19 MED ORDER — POLYVINYL ALCOHOL 1.4 % OP SOLN
1.0000 [drp] | Freq: Four times a day (QID) | OPHTHALMIC | Status: DC | PRN
  Filled 2020-10-19: qty 15

## 2020-10-19 MED ORDER — NYSTATIN 100000 UNIT/GM EX POWD
Freq: Three times a day (TID) | CUTANEOUS | Status: DC | PRN
  Filled 2020-10-19: qty 15

## 2020-10-19 MED ORDER — ACETAMINOPHEN 650 MG RE SUPP: 650.0000 mg | Freq: Four times a day (QID) | RECTAL | Status: DC | PRN

## 2020-10-19 MED ORDER — LORAZEPAM 2 MG/ML IJ SOLN: 1.0000 mg | INTRAMUSCULAR | Status: DC | PRN

## 2020-10-19 MED ORDER — HYDROMORPHONE HCL 1 MG/ML IJ SOLN: 0.5000 mg | INTRAMUSCULAR | Status: DC | PRN

## 2020-10-19 MED ORDER — HALOPERIDOL LACTATE 2 MG/ML PO CONC
0.5000 mg | ORAL | Status: DC | PRN
  Filled 2020-10-19: qty 0.3

## 2020-10-19 MED ORDER — LORAZEPAM 1 MG PO TABS: 1.0000 mg | ORAL_TABLET | ORAL | Status: DC | PRN

## 2020-10-19 MED ORDER — HALOPERIDOL 0.5 MG PO TABS
0.5000 mg | ORAL_TABLET | ORAL | Status: DC | PRN
  Filled 2020-10-19: qty 1

## 2020-10-19 MED ORDER — GLYCOPYRROLATE 0.2 MG/ML IJ SOLN: 0.2000 mg | INTRAMUSCULAR | Status: DC | PRN

## 2020-10-19 MED ORDER — OXYBUTYNIN CHLORIDE 5 MG PO TABS: 2.5000 mg | ORAL_TABLET | Freq: Four times a day (QID) | ORAL | Status: DC | PRN

## 2020-10-19 MED ORDER — GLYCOPYRROLATE 1 MG PO TABS
1.0000 mg | ORAL_TABLET | ORAL | Status: DC | PRN
  Filled 2020-10-19: qty 1

## 2020-10-19 MED ORDER — BIOTENE DRY MOUTH MT LIQD: 15.0000 mL | OROMUCOSAL | Status: DC | PRN

## 2020-10-19 MED ORDER — ONDANSETRON 4 MG PO TBDP: 4.0000 mg | ORAL_TABLET | Freq: Four times a day (QID) | ORAL | Status: DC | PRN

## 2020-10-19 MED ORDER — HALOPERIDOL LACTATE 5 MG/ML IJ SOLN: 0.5000 mg | INTRAMUSCULAR | Status: DC | PRN

## 2020-10-19 MED ORDER — SODIUM CHLORIDE 0.9 % IV SOLN
12.5000 mg | Freq: Four times a day (QID) | INTRAVENOUS | Status: DC | PRN
  Filled 2020-10-19: qty 0.5

## 2020-10-19 NOTE — Progress Notes (Signed)
Civil engineer, contracting [facility]) Hospital Liaison Note   Received request from Transitions of Care Manager Danford Bad, LCSW for family interest in [facility]. Visited patient at bedside and spoke with patient/family to confirm interest and explain services. Patient chart and information under review by [facility] physician. [facility] eligibility pending at this time.    Unfortunately, [facility] is not able to offer a room today. Family and [facility] Manager aware hospital liaison will follow up tomorrow or sooner once eligibility is determined and if a room becomes available.    Please do not hesitate to call with any hospice related questions.    Thank you for the opportunity to participate in this patient's care.   Bobbie "Einar Gip, RN, BSN [facility] Liaison [phone]

## 2020-10-19 NOTE — Progress Notes (Addendum)
Progress Note  Patient Name: Kimberly Ballard Date of Encounter: 10/19/2020  [facility] Cardiologist: Armanda Magic, MD   Subjective   Reviewed family meeting yesterday and GOC discussion with palliative care. Patient wishes to transition to residential hospice towards the end of this hospitalization. With regards to afib, could consider transition to oral amiodarone or just d/c infusion all together given the fact she is pursuing hospice/comfort care.  Inpatient Medications    Scheduled Meds:  bisacodyl  10 mg Rectal BID   budesonide (PULMICORT) nebulizer solution  0.5 mg Nebulization BID   chlorhexidine  15 mL Mouth Rinse BID   Chlorhexidine Gluconate Cloth  6 each Topical Daily   levalbuterol  0.63 mg Nebulization Once   levalbuterol  1.25 mg Nebulization BID   liver oil-zinc oxide   Topical TID   mouth rinse  15 mL Mouth Rinse q12n4p   Continuous Infusions:  sodium chloride Stopped (10/15/20 2047)   amiodarone 30 mg/hr (10/19/20 0600)   PRN Meds: sodium chloride, HYDROmorphone (DILAUDID) injection, ondansetron (ZOFRAN) IV, oxybutynin, simethicone, sodium chloride   Vital Signs    Vitals:   10/19/20 0633 10/19/20 0634 10/19/20 0700 10/19/20 0730  BP:   123/78 129/75  Pulse: (!) 103  (!) 109 (!) 135  Resp: 15 20 (!) 28 13  Temp:    98.6 F (37 C)  TempSrc:    Oral  SpO2: 92% 94% 93% 92%  Weight:      Height:        Intake/Output Summary (Last 24 hours) at 10/19/2020 0837 Last data filed at 10/19/2020 0800 Gross per 24 hour  Intake 2375.35 ml  Output 360 ml  Net 2015.35 ml   Last 3 Weights 10/18/2020 10/16/2020 10/16/2020  Weight (lbs) 208 lb 5.4 oz 207 lb 7.3 oz 207 lb 7.3 oz  Weight (kg) 94.5 kg 94.1 kg 94.1 kg      Telemetry    Atrial fibrillation with CVR Personally Reviewed  ECG   N/A  Physical Exam   GEN: very ill appearing HEENT: Normal NECK: No JVD; No carotid bruits LYMPHATICS: No lymphadenopathy CARDIAC:irregularly irregular and tachy,  no murmurs, rubs, gallops RESPIRATORY:  decreased BS at bases ABDOMEN: Soft,distended and tender to palpation MUSCULOSKELETAL:  1+ bilateral LE edema; No deformity  SKIN: Warm and dry NEUROLOGIC:  Alert and oriented x 3 PSYCHIATRIC:  Normal affect    Labs    High Sensitivity Troponin:   Recent Labs  Lab 09/24/20 0239 10/04/20 1721 10/04/20 1923 10/10/20 1225 10/11/20 0742  TROPONINIHS 52* 59* 67* 145* 104*      Chemistry Recent Labs  Lab 10/16/20 0408 10/17/20 0252 10/18/20 0849  NA 130* 135 133*  K 4.0 3.6 3.3*  CL 100 106 107  CO2 24 21* 21*  GLUCOSE 112* 97 90  BUN 19 15 13   CREATININE 0.44 0.50 0.44  CALCIUM 7.7* 7.9* 7.8*  PROT 5.3* 5.4* 5.0*  ALBUMIN 2.4* 2.6* 2.4*  AST 57* 33 23  ALT 90* 70* 56*  ALKPHOS 63 55 56  BILITOT 1.2 1.1 1.0  GFRNONAA >60 >60 >60  ANIONGAP 6 8 5      Hematology Recent Labs  Lab 10/16/20 0408 10/17/20 0252 10/18/20 0849  WBC 6.4 5.4 5.4  RBC 4.24 3.97 4.03  HGB 13.1 12.5 12.5  HCT 38.9 36.9 37.2  MCV 91.7 92.9 92.3  MCH 30.9 31.5 31.0  MCHC 33.7 33.9 33.6  RDW 14.2 14.6 14.9  PLT 117* 97* 97*  BNPNo results for input(s): BNP, PROBNP in the last 168 hours.   DDimer No results for input(s): DDIMER in the last 168 hours.   Radiology    No results found.  Cardiac Studies   Complete Echo 09/23/2020: Impressions:  1. Left ventricular ejection fraction, by estimation, is 55 to 60%. Left  ventricular ejection fraction by 3D volume is 59 %. The left ventricle has  normal function. The left ventricle has no regional wall motion  abnormalities. There is moderate left  ventricular hypertrophy. Left ventricular diastolic parameters are  consistent with Grade I diastolic dysfunction (impaired relaxation).   2. Right ventricular systolic function mildly reduced at base and mid,  with relative preserved apical systolic function. The right ventricular  size is moderately enlarged. There is moderately elevated pulmonary  artery  systolic pressure. The estimated  right ventricular systolic pressure is 48.4 mmHg.   3. Left atrial size was mildly dilated.   4. Right atrial size was moderately dilated.   5. The mitral valve is normal in structure. Trivial mitral valve  regurgitation. No evidence of mitral stenosis.   6. Tricuspid valve regurgitation is mild to moderate.   7. The aortic valve is normal in structure. Aortic valve regurgitation is  not visualized. No aortic stenosis is present.   8. The inferior vena cava is dilated in size with <50% respiratory  variability, suggesting right atrial pressure of 15 mmHg.  _______________   Limited Echo with Bubble Study 10/01/2020: Impressions: 1. Left ventricular ejection fraction, by estimation, is 60 to 65%. The  left ventricle has normal function. There is moderate concentric left  ventricular hypertrophy. Left ventricular diastolic function could not be  evaluated.   2. The mitral valve is grossly normal. Trivial mitral valve  regurgitation. No evidence of mitral stenosis.   3. The aortic valve is tricuspid. There is mild calcification of the  aortic valve. Aortic valve regurgitation is trivial. No aortic stenosis is  present.   4. Agitated saline contrast bubble study was negative, with no evidence  of any interatrial shunt.   Conclusion(s)/Recommendation(s): Limited study, no major change from  recent study 09/23/20. Bubble study negative for intraatrial shunt.   Patient Profile     80 y.o. female with a history of COPD with prior tobacco use (quit in 2019) who was admitted on 09/22/2020 for acute hypoxic respiratory failure secondary to COPD exacerbation and multifocal pneumonia after presenting with shortness of breath, body aches, cough, and decreased appetite. Negative for COVID this admission. Cardiology consulted for elevated troponin. Did alter develop atrial fibrillation with RVR and was started on Cardizem and Eliquis with control of rates but  not restoration of sinus rhythm Cardiology signed off on 09/26/2020 with plans to have patient follow-up in the [facility] and likely DCCV after 304 weeks of anticoagulation. We were reconsulted on 10/01/2020 given recurrent RVR.  Assessment & Plan    New Onset Atrial Fibrillation with RVR - In the setting of acute hypoxic respiratory failure secondary to COPD exacerbation and pneumonia.  - currently on IV Amio but HR remains elevated and hypotension limits boluses of Amio to try to control HR - She also received a few doses of IV Digoxin. Last dose on 10/16/2020. - still with HR in the 100-110's - Restarted IV Heparin on 9/12 but now off due to hematemesis -D/c IV amiodarone - consider transition to oral amiodarone 200 mg BID if she can take it or if full comfort at this point, would  just stop the medicine.   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  - Most recent chest x-ray on 10/04/2020 showed bilateral lower atelectasis or infiltrates slightly improved from prior study. - Echo showed LVEF of 59% but moderately enlarged RV with mildly reduced systolic function at the base and mid segments with relatively preserved apical systolic function. This can be seen in acute PE  but chest CTA negative for this. - Initially diuresed with IV Lasix but this was stopped on 9/7. Net negative -6.76 L this admission. I don't think weights are accurate the last few days (200 lbs >> 192 lbs >> 208 lbs >> 207lbs today). Dry weight around 190 lbs. - Noted to develop some lower extremity edema. - Holding on additional Lasix at this time - RV dysfunction felt to be secondary to pulmonary hypertension. This is felt to be chronic but worsened in the setting of acute COPD exacerbation and pneumonia.    Hypotension/Shock - Hypotensive in setting of acute hematemasis - BP has stabilized - Phenylephrine weaned off  - Palliative Care has been consulted to  help clarify goals of care given little progress, will transition to residential hospice possibly today   Elevated Troponin - High-sensitivity troponin mildly elevated and flat. Initially peaked at 128 on 09/22/2020 and then downtrended. Increased again to 145 on 9/9 but again downtrending. - EKG showed T wave abnormality in anterior leads. No prior tracing for comparison. - Echo as above. - Troponin elevation not felt to be ACS and more consistent with demand. She does have coronary calcification on CT so may benefit from outpatient Lexiscan vs coronary CTA if she recovers from acute illness.    AKI - Resolved - Creatinine peaked at 1.69 on 9/10 but has since returned to baseline. Likely due to hypotension. - Creatinine 0.44  Hypokalemia -potassium 3.3 today, replete   Otherwise, per primary team: - Acute hypoxic/hypercapnic respiratory failure requiring intermittent BiPAP  - Possible sepsis - COPD Exacerbation - Multifocal pneumonia  - Abdominal pain - Transamnitis: stable - Hyponatremia - Hematuria: Started on CBI. Urology consulted. - Ileus: S/p NGT placement. General Surgery consulted.  [facility] will sign off.   Medication Recommendations:  as above Other recommendations (labs, testing, etc):  none Follow up as an outpatient:  none   For questions or updates, please contact [facility] Please consult [email or web address] for contact info under   Chrystie Nose, MD, Milagros Loll  [place]  [facility]  Medical Director of the [facility]  [facility] Diplomate of the American Board of Clinical Lipidology Attending Cardiologist  Direct Dial: [phone]  Fax: [phone]  Website:  [email or web address]  Chrystie Nose, MD  10/19/2020, 8:37 AM

## 2020-10-19 NOTE — TOC Transition Note (Signed)
Transition of Care [employer]) - CM/SW Discharge Note   Patient Details  Name: Kimberly Ballard MRN: [id number] Date of Birth: 1940/07/15  [facility] CM/SW Contact:  Deyton Ellenbecker, Vinnie Langton, LCSW Phone Number: 10/19/2020, 2:40 PM   Clinical Narrative:     LCSW placed a referral to Karen Kitchens, representative with [facility] through [employer].  Karen Kitchens will evaluate patient for residential hospice placement for end-of-life care.  Final next level of care: Residential Hospice Barriers to Discharge: Continued Medical Work up   Patient Goals and CMS Choice Patient states their goals for this hospitalization and ongoing recovery are:: to go home CMS Medicare.gov Compare Post Acute Care list provided to:: Patient Choice offered to / list presented to : Patient, Adult Children  Discharge Placement   Residential Hospice if deemed appropriate and bed available.  Discharge Plan and Services   Discharge Planning Services: CM Consult            Residential Hospice   Social Determinants of Health (SDOH) Interventions  N/A  Readmission Risk Interventions No flowsheet data found.    Danford Bad, BSW, MSW, [employer]  Licensed Visual merchandiser  [employer]  Mailing Address-[address], [place], [place] [address] Physical Address-[address] [place], [place] [address] Toll Free Main # [phone] Fax # [phone] Cell # [phone]  [email or web address]

## 2020-10-19 NOTE — Progress Notes (Signed)
PROGRESS NOTE    Kimberly Ballard  IRS:[id number] DOB: 1940-08-29 DOA: 09/22/2020 PCP: System, Provider Not In    Chief Complaint  Patient presents with   Shortness of Breath    Brief Narrative:  Kimberly Ballard is an 80 y.o. female with a history of COPD presented with shortness of breath.  hypoxic respiratory failure.  Reported COVID exposure but tested negative.  CTA chest ruled out pulmonary embolism but showed bilateral infiltrates, hospital course complicated by multiple issues including AKI, hematuria, a fib with RVR and ileus/obstruction. Multiple subspecialty have been following her, she does not appear to make any progress, palliative care consulted as well for goals of care discussion.  Plan for 9/13: repeat CT Scan.       Assessment & Plan:   Principal Problem:   Acute respiratory failure with hypoxia and hypercapnia (HCC) Active Problems:   Atrial fibrillation with RVR (HCC)   Ileus (HCC)   CAP (community acquired pneumonia)   Septic shock (HCC)   Elevated LFTs   Cardiomegaly   Elevated troponin   Acute and chronic respiratory failure (acute-on-chronic) (HCC)   Dyspnea   Hypoxemia   RVF (right ventricular failure) (HCC)   Shock circulatory (HCC)   Chest pain   Palliative care by specialist   Goals of care, counseling/discussion   General weakness   Pressure injury of skin   Constipation   SBO (small bowel obstruction) (HCC)  1 acute hypoxemic/hypercapnic respiratory failure with history of COPD -On presentation patient initially required BiPAP however currently on high flow nasal cannula at 3 L with sats of 91%. -CT angiogram chest done negative for PE, however concerning for multifocal pneumonia. -COVID-19 screening negative and serology negative, respiratory viral panel negative, autoimmune panel largely unremarkable, blood cultures with no growth to date. -Patient initially was treated with IV Rocephin and azithromycin and steroids with repeat chest x-ray  showing no significant improvement. -Procalcitonin noted to be elevated on 9/9 of 2.6 and patient placed on Vanco and cefepime. -Vancomycin discontinued. -Patient was seen and followed by PCCM, Dr. Marchelle Gearing who recommended cefepime x7 days. -Patient with no wheezing noted on examination.   -IV fluids saline lock.   -Patient on nebulizer treatments.   -Patient and family have decided to transition to full comfort measures and palliative care managing patient's pain and request made for residential hospice home.    2.  Severe constipation versus ileus versus small bowel obstruction -Patient seen in consultation by GI and general surgery was on NG tube for decompression which was subsequently removed per general surgery. -Patient noted to have large loose stools 10/15/2020 -NG tube removed 10/17/2020 patient with trial of clear liquids however seems not to have tolerated it as patient had a large bout of coffee-ground emesis 10/18/2020, per RN. -GI and general surgery following. -Concern for nutritional status, TPN being considered by general surgery however patient considering transitioning to comfort measures and as such TPN on hold for now until palliative can reassess patient for goals of care. -Abdominal films from 10/16/2020 with continued marked gaseous distention of the small bowel loops concerning for small bowel obstruction unchanged. -Patient back to n.p.o. status. -Per GI and general surgery. -Patient being followed by palliative care and decision made to transition to full comfort measures.    3.  A. fib with RVR and hypotension/shock -Was on Neo-Synephrine drip which has subsequently been discontinued. -Patient received a dose of IV albumin on 10/15/2020, another dose of IV albumin ordered per PCCM today  due to systolic blood pressures in the 80s.  -Currently on amiodarone drip per cardiology. -Daily EKG ordered per cardiology to follow-up on QTC as patient on amiodarone. -Amiodarone  150 mg bolus given on 10/15/2020 and patient currently on amiodarone drip . -Heart rate in the 110s but went up as high as 147.. -Was on IV heparin for anticoagulation which is currently being held due to concerns of coffee-ground emesis this morning. -Cardiology was following but as patient and family have decided to transition to full comfort measures IV amiodarone has been discontinued with cardiology recommended oral amiodarone 200 mg twice daily if patient able to take it or just discontinuation of amiodarone in total.  Cardiology signed off as of 10/19/2020. -Patient and family have decided to transition to full comfort measures  4.  Acute diastolic CHF/pulmonary hypertension -Due to soft borderline blood pressures diuretics on hold.  -Patient does not look volume overloaded on examination. -Patient however with some expiratory wheezes. -BNP noted to be elevated on presentation at 889 and trended down to 430. -2D echo with EF of 59%, moderately enlarged RV with mildly reduced systolic function at the base and mid segments with relatively preserved apical systolic function.- -CT angiogram chest negative for PE. -Urine output of 400 cc recorded over the past 24 hours noted. -Current weight of 208.34 pounds. -Cardiology recommending repeat 2D echo once respiratory issues have resolved. -Patient with coffee-ground emesis this morning, also noted to have systolic blood pressures in the 80s and placed on gentle hydration.   -Due to expiratory wheezes IV fluids were discontinued patient placed on nebulizer treatments.  -Status post IV albumin per PCCM during this hospitalization. -Cardiology was following but have signed off as of 10/19/2020. -Patient and family decided to transition to full comfort measures.  5.  Elevated troponin -Per cardiology likely secondary to demand ischemia. -Troponins mildly elevated peaked and trended down. -EKG changes of T wave abnormality in anterior leads  noted. -2D echo with  EF of 59%, moderately enlarged RV with mildly reduced systolic function at the base and mid segments with relatively preserved apical systolic function.- -Per cardiology coronary calcification noted on CT scan and recommending outpatient Lexiscan versus coronary CTA once patient has recovered from acute illness. -Patient and family have decided to transition to full comfort measures. -Cardiology was following but have signed off as of 10/19/2020.  6.  Gross hematuria -Seen in consultation by urology and patient underwent CBI. -Currently off CBI with resolution of hematuria. -Was on IV heparin which was discontinued due to coffee-ground emesis.  -Patient and family have decided for full comfort measures.  7.  Yeast in urine -Status post full course Diflucan.  8.  Acute kidney injury on chronic kidney disease stage II -Resolved. -Avoid nephrotoxins, hypotension.  9.  Right middle lobe nodule -Patient and family decided to transition to full comfort measures.  10.  Hypokalemia -Potassium at 3.3 on 10/18/2020 and received 4 rounds of KCl. -Late yesterday afternoon decision made by patient and family to transition to full comfort measures and as such labs discontinued..   -Comfort measures.  11.  Pressure injury, poa Pressure Injury 10/15/20 Buttocks Left;Medial;Upper Stage 2 -  Partial thickness loss of dermis presenting as a shallow open injury with a red, pink wound bed without slough. pink, red, yellow (Active)  10/15/20 1119  Location: Buttocks  Location Orientation: Left;Medial;Upper  Staging: Stage 2 -  Partial thickness loss of dermis presenting as a shallow open injury with a red, pink wound  bed without slough.  Wound Description (Comments): pink, red, yellow  Present on Admission: No     Pressure Injury 10/15/20 Buttocks Left;Medial;Lower Stage 2 -  Partial thickness loss of dermis presenting as a shallow open injury with a red, pink wound bed without  slough. red, yellow, pink (Active)  10/15/20 1120  Location: Buttocks  Location Orientation: Left;Medial;Lower  Staging: Stage 2 -  Partial thickness loss of dermis presenting as a shallow open injury with a red, pink wound bed without slough.  Wound Description (Comments): red, yellow, pink  Present on Admission: No     Pressure Injury 10/15/20 Buttocks Right;Medial Stage 2 -  Partial thickness loss of dermis presenting as a shallow open injury with a red, pink wound bed without slough. pink, red, yellow (Active)  10/15/20 1120  Location: Buttocks  Location Orientation: Right;Medial  Staging: Stage 2 -  Partial thickness loss of dermis presenting as a shallow open injury with a red, pink wound bed without slough.  Wound Description (Comments): pink, red, yellow  Present on Admission: No   12.  Goals of care -Patient with poor prognosis.  Patient not progressing or improving clinically.  Patient with another setback with large coffee-ground emesis this morning.  Patient has been reassessed by palliative care and patient was discussing with family to decide whether to transition to full comfort measures at this time.  Palliative care following and discussed with patient again, patient with no improvement with worsening abdominal distention and decision made to transition to full comfort measures with probable discharge to residential hospice per palliative care team..         DVT prophylaxis: Heparin discontinued due to coffee-ground emesis.  Patient now comfort measures. Code Status: DNR Family Communication: Updated patient.  Updated son at bedside.  Disposition:   Status is: Inpatient  Remains inpatient appropriate because:Inpatient level of care appropriate due to severity of illness  Dispo: The patient is from: Home              Anticipated d/c is to:  TBD              Patient currently is not medically stable to d/c.   Difficult to place patient No       Consultants:   Cardiology: Dr. Mayford Knife 09/24/2020 PCCM: Dr. Marchelle Gearing 09/23/2020 Urology: Dr. Alvester Morin 10/01/2020 -Wound care RN General surgery: Dr. Daphine Deutscher 10/07/2020 Gastroenterology: Dr. Levora Angel 10/10/2020 Palliative care: Dr. Neale Burly 19 2022       Procedures:  CT angiogram chest 09/22/2020, 09/24/2020 CT abdomen and pelvis 09/30/2020, 10/08/2020, 10/14/2020 Abdominal films 10/16/2020, 10/14/2020, 10/13/2020, 10/12/2020, 10/10/2020, 10/09/2020, 10/08/2020, 10/07/2020, 09/30/2020, Chest x-ray 09/25/2020, 09/26/2020, 09/27/2020, 09/29/2020, 10/04/2020, 10/10/2020 Abdominal ultrasound 09/23/2020, 2D echo 09/23/2020, 10/01/2020 Lower extremity Dopplers 09/24/2020  Antimicrobials:  Anti-infectives (From admission, onward)    Start     Dose/Rate Route Frequency Ordered Stop   10/14/20 0815  ceFEPIme (MAXIPIME) 2 g in sodium chloride 0.9 % 100 mL IVPB        2 g 200 mL/hr over 30 Minutes Intravenous Every 8 hours 10/14/20 0718 10/16/20 2200   10/12/20 1200  ceFEPIme (MAXIPIME) 2 g in sodium chloride 0.9 % 100 mL IVPB  Status:  Discontinued        2 g 200 mL/hr over 30 Minutes Intravenous Every 12 hours 10/12/20 1133 10/14/20 0718   10/10/20 2000  ceFEPIme (MAXIPIME) 2 g in sodium chloride 0.9 % 100 mL IVPB  Status:  Discontinued  2 g 200 mL/hr over 30 Minutes Intravenous Every 24 hours 10/10/20 1812 10/12/20 1133   10/10/20 1930  vancomycin (VANCOREADY) IVPB 1500 mg/300 mL        1,500 mg 150 mL/hr over 120 Minutes Intravenous  Once 10/10/20 1812 10/10/20 2045   10/10/20 1838  vancomycin variable dose per unstable renal function (pharmacist dosing)  Status:  Discontinued         Does not apply See admin instructions 10/10/20 1838 10/12/20 0834   10/09/20 1000  fluconazole (DIFLUCAN) tablet 200 mg        200 mg Per Tube Daily 10/09/20 0826 10/10/20 0903   10/08/20 1000  fluconazole (DIFLUCAN) tablet 200 mg  Status:  Discontinued        200 mg Oral Daily 10/07/20 1426 10/09/20 0826   10/03/20 1700  fluconazole (DIFLUCAN)  tablet 200 mg  Status:  Discontinued        200 mg Oral Daily 10/03/20 1606 10/07/20 1426   09/23/20 1800  cefTRIAXone (ROCEPHIN) 2 g in sodium chloride 0.9 % 100 mL IVPB        2 g 200 mL/hr over 30 Minutes Intravenous Every 24 hours 09/22/20 1842 09/29/20 1749   09/23/20 1800  azithromycin (ZITHROMAX) 500 mg in sodium chloride 0.9 % 250 mL IVPB  Status:  Discontinued        500 mg 250 mL/hr over 60 Minutes Intravenous Every 24 hours 09/22/20 1842 09/25/20 1456   09/22/20 1630  cefTRIAXone (ROCEPHIN) 1 g in sodium chloride 0.9 % 100 mL IVPB        1 g 200 mL/hr over 30 Minutes Intravenous  Once 09/22/20 1618 09/22/20 1806   09/22/20 1630  azithromycin (ZITHROMAX) 500 mg in sodium chloride 0.9 % 250 mL IVPB        500 mg 250 mL/hr over 60 Minutes Intravenous  Once 09/22/20 1618 09/22/20 1939         Subjective: Laying in bed.  Denies any chest pain.  Denies any significant shortness of breath.  Denies any nausea or emesis.  Still with some abdominal discomfort/bloating.  Patient noted yesterday evening to have made a decision to transition to full comfort measures.  Still on amiodarone drip.   Objective: Vitals:   10/19/20 0633 10/19/20 0634 10/19/20 0700 10/19/20 0730  BP:   123/78 129/75  Pulse: (!) 103  (!) 109 (!) 135  Resp: 15 20 (!) 28 13  Temp:    98.6 F (37 C)  TempSrc:    Oral  SpO2: 92% 94% 93% 92%  Weight:      Height:        Intake/Output Summary (Last 24 hours) at 10/19/2020 1059 Last data filed at 10/19/2020 1021 Gross per 24 hour  Intake 2442.91 ml  Output 360 ml  Net 2082.91 ml    Filed Weights   10/16/20 0100 10/16/20 1000 10/18/20 1000  Weight: 94.1 kg 94.1 kg 94.5 kg    Examination:  General exam: NAD. Respiratory system: Clear to auscultation bilaterally anterior lung fields.  No crackles, no rhonchi.  Cardiovascular system: Irregularly irregular.  No JVD, no murmurs rubs or gallops.  No lower extremity edema.    Gastrointestinal system:  Abdomen is soft, distended, hypoactive bowel sounds, nontender to palpation however some abdominal discomfort to palpation.  No rebound.  No guarding.    Central nervous system: Alert and oriented.  Moving extremities spontaneously.  No focal neurological deficits.   Extremities: Symmetric 5 x 5 power.  Skin: No rashes, lesions or ulcers Psychiatry: Judgement and insight appear fair. Mood & affect appropriate.     Data Reviewed: I have personally reviewed following labs and imaging studies  CBC: Recent Labs  Lab 10/14/20 0250 10/15/20 0314 10/16/20 0408 10/17/20 0252 10/18/20 0849  WBC 6.9 6.0 6.4 5.4 5.4  NEUTROABS  --   --   --  4.1 4.0  HGB 13.6 12.5 13.1 12.5 12.5  HCT 39.7 36.3 38.9 36.9 37.2  MCV 93.2 91.7 91.7 92.9 92.3  PLT 155 137* 117* 97* 97*     Basic Metabolic Panel: Recent Labs  Lab 10/13/20 0245 10/14/20 0250 10/15/20 0314 10/16/20 0408 10/17/20 0252 10/18/20 0849  NA 135 131* 131* 130* 135 133*  K 3.4* 3.9 3.2* 4.0 3.6 3.3*  CL 98 99 97* 100 106 107  CO2 26 23 24 24  21* 21*  GLUCOSE 120* 120* 134* 112* 97 90  BUN 45* 32* 22 19 15 13   CREATININE 0.64 0.52 0.37* 0.44 0.50 0.44  CALCIUM 7.8* 7.8* 7.9* 7.7* 7.9* 7.8*  MG 2.5*  --  2.0 2.0 1.9 1.8     GFR: Estimated Creatinine Clearance: 62.3 mL/min (by C-G formula based on SCr of 0.44 mg/dL).  Liver Function Tests: Recent Labs  Lab 10/14/20 0250 10/15/20 0314 10/16/20 0408 10/17/20 0252 10/18/20 0849  AST 48* 44* 57* 33 23  ALT 80* 76* 90* 70* 56*  ALKPHOS 57 51 63 55 56  BILITOT 1.1 1.1 1.2 1.1 1.0  PROT 5.4* 5.3* 5.3* 5.4* 5.0*  ALBUMIN 2.2* 2.4* 2.4* 2.6* 2.4*     CBG: Recent Labs  Lab 10/17/20 1355 10/17/20 1620 10/17/20 2155 10/18/20 0803 10/18/20 1154  GLUCAP 80 88 86 89 93      No results found for this or any previous visit (from the past 240 hour(s)).       Radiology Studies: No results found.      Scheduled Meds:  bisacodyl  10 mg Rectal BID    budesonide (PULMICORT) nebulizer solution  0.5 mg Nebulization BID   chlorhexidine  15 mL Mouth Rinse BID   Chlorhexidine Gluconate Cloth  6 each Topical Daily   levalbuterol  0.63 mg Nebulization Once   levalbuterol  1.25 mg Nebulization BID   liver oil-zinc oxide   Topical TID   mouth rinse  15 mL Mouth Rinse q12n4p   Continuous Infusions:  sodium chloride Stopped (10/15/20 2047)     LOS: 27 days    Time spent: 35 minutes    10/17/20, MD [facility]   To contact the attending provider between 7A-7P or the covering provider during after hours 7P-7A, please log into the web site [email or web address] and access using universal [place] password for that web site. If you do not have the password, please call the hospital operator.  10/19/2020, 10:59 AM

## 2020-10-19 NOTE — Progress Notes (Signed)
Daily Progress Note   Patient Name: Kimberly Ballard       Date: 10/19/2020 DOB: [birth year]  Age: 80 y.o. MRN#: [id number] Attending Physician: Rodolph Bong, MD Primary Care Physician: System, Provider Not In Admit Date: 09/22/2020  Reason for Consultation/Follow-up: Establishing goals of care  Subjective: Kimberly Ballard appears weaker, she is not able to tolerate ice chips, she is resting in bed. Abdomen appears more distended today.  Call placed and discussed with Thayer Ohm after I discussed directly with Kimberly Ballard today: she is ready to proceed with a hospice consultation for consideration for residential hospice. TOC consulted by [facility] MD. Appreciate cardiology input and agree with their recommendations.    See below.     Length of Stay: 27  Current Medications: Scheduled Meds:   bisacodyl  10 mg Rectal BID   budesonide (PULMICORT) nebulizer solution  0.5 mg Nebulization BID   chlorhexidine  15 mL Mouth Rinse BID   Chlorhexidine Gluconate Cloth  6 each Topical Daily   levalbuterol  0.63 mg Nebulization Once   levalbuterol  1.25 mg Nebulization BID   liver oil-zinc oxide   Topical TID   mouth rinse  15 mL Mouth Rinse q12n4p    Continuous Infusions:  sodium chloride Stopped (10/15/20 2047)    PRN Meds: sodium chloride, HYDROmorphone (DILAUDID) injection, ondansetron (ZOFRAN) IV, oxybutynin, simethicone, sodium chloride           General: Alert, awake,     Heart: Tachycardic.   Lungs: Decreased air movement,   Abdomen: Soft, nontender, does appear mildly distended.    Ext: +edema Skin: Warm and dry Neuro: Grossly intact, nonfocal. O2 via  currently.   Vital Signs: BP 129/75 (BP Location: Right Arm)   Pulse (!) 135   Temp 98.6 F (37 C) (Oral)   Resp 13   Ht 5\' 3"  (1.6 m)    Wt 94.5 kg   SpO2 92%   BMI 36.90 kg/m  SpO2: SpO2: 92 % O2 Device: O2 Device: Nasal Cannula O2 Flow Rate: O2 Flow Rate (L/min): 3 L/min  Intake/output summary:  Intake/Output Summary (Last 24 hours) at 10/19/2020 1118 Last data filed at 10/19/2020 1021 Gross per 24 hour  Intake 2442.91 ml  Output 360 ml  Net 2082.91 ml   LBM: Last BM Date: 10/19/20 Baseline Weight: Weight:  90.9 kg Most recent weight: Weight: 94.5 kg       Palliative Assessment/Data:    Flowsheet Rows    Flowsheet Row Most Recent Value  Intake Tab   Referral Department Hospitalist  Unit at Time of Referral ICU  Palliative Care Primary Diagnosis Sepsis/Infectious Disease  Date Notified 10/09/20  Palliative Care Type New Palliative care  Reason for referral Clarify Goals of Care  Date of Admission 09/22/20  Date first seen by Palliative Care 10/11/20  # of days Palliative referral response time 2 Day(s)  # of days IP prior to Palliative referral 17  Clinical Assessment   Palliative Performance Scale Score 30%  Psychosocial & Spiritual Assessment   Palliative Care Outcomes   Patient/Family meeting held? Yes  [name]       Patient Active Problem List   Diagnosis Date Noted   Pressure injury of skin 10/15/2020   Constipation    SBO (small bowel obstruction) (HCC)    Palliative care by specialist    Goals of care, counseling/discussion    General weakness    Chest pain    Shock circulatory (HCC)    Hypoxemia    Ileus (HCC)    RVF (right ventricular failure) (HCC)    Dyspnea    Acute and chronic respiratory failure (acute-on-chronic) (HCC)    Atrial fibrillation with RVR (HCC)    Elevated troponin 09/23/2020   CAP (community acquired pneumonia) 09/22/2020   Acute respiratory failure with hypoxia and hypercapnia (HCC) 09/22/2020   Septic shock (HCC) 09/22/2020   Elevated LFTs 09/22/2020   Cardiomegaly 09/22/2020    Palliative Care Assessment & Plan   Patient  Profile: 80 y.o. female  with past medical history of COPD, A. fib on Eliquis, and former tobacco use admitted on 09/22/2020 with dyspnea, aches and cough after COVID exposure.  She has had a prolonged hospital course after developing progressive respiratory failure from which she has become high flow nasal cannula dependent.  She has been treated for multifocal pneumonia.  She is also intermittently requiring BiPAP.  She has developed generalized ileus.  She has diastolic heart failure and atrial fibrillation on amiodarone and diltiazem.  She has been hypotensive on IV pressors.  She has AKI which is improving.  Unfortunately, she is not making significant gains or improvement.  Palliative consulted for goals of care.  Recommendations/Plan: -Agree with DNR/DNI -Patient with an ongoing escalating symptom burden such as abdominal discomfort, shortness of breath sensation and with recent large bout of coffee-ground emesis earlier today. Abdominal imaging with CT results reviewed again: CT 9/13 with PSBO vs ileus, some mesenteric swirling noted in mid abdomen but contrast reaches to level of rectum which would argue against SBO.   Patient makes her wishes known clearly that she does not want surgery. She does not want intubation and mechanical ventilation.   She was in the process of completing her living will and other documents at her lawyer's office when she had to get admitted to the hospital. She does elect her daughter Kimberly Ballard to be her HCPOA agent. She has 3 children, she has 2 sons. Her husband died couple of months ago, at home, with hospice support.   Appreciate chaplain assistance with getting advanced directives notarized.    10-19-20: Comfort measures Residential hospice TOC consulted Continue current pain and non pain symptom management measures Appreciate spiritual care Prognosis likely 2 weeks or less.      Goals of Care and Additional Recommendations:  Limitations on Scope of  Treatment: comfort care.   Code Status:    Code Status Orders  (From admission, onward)           Start     Ordered   09/27/20 0929  Do not attempt resuscitation (DNR)  Continuous        09/27/20 0928           Code Status History     Date Active Date Inactive Code Status Order ID Comments User Context   09/22/2020 2144 09/27/2020 0928 Full Code [id number]  Therisa Doyne, MD Inpatient       Prognosis:  Less than 2 weeks in my opinion.   Discharge Planning:  residential hospice    Care plan was discussed with patient and her daughter Thayer Ohm on the phone.   Thank you for allowing the Palliative Medicine Team to assist in the care of this patient.   Total Time 35 Prolonged Time Billed  no       Greater than 50%  of this time was spent counseling and coordinating care related to the above assessment and plan.  Rosalin Hawking, MD  Please contact Palliative Medicine Team phone at [phone] for questions and concerns from 7 AM to 7 PM. After 7 PM, please call primary service.

## 2020-10-19 NOTE — Plan of Care (Signed)
  Problem: Pain Managment: Goal: General experience of comfort will improve Outcome: Progressing   

## 2020-10-20 DIAGNOSIS — J189 Pneumonia, unspecified organism: Secondary | ICD-10-CM | POA: Diagnosis not present

## 2020-10-20 DIAGNOSIS — R140 Abdominal distension (gaseous): Secondary | ICD-10-CM | POA: Diagnosis not present

## 2020-10-20 DIAGNOSIS — I4891 Unspecified atrial fibrillation: Secondary | ICD-10-CM | POA: Diagnosis not present

## 2020-10-20 DIAGNOSIS — Z515 Encounter for palliative care: Secondary | ICD-10-CM | POA: Diagnosis not present

## 2020-10-20 DIAGNOSIS — J9602 Acute respiratory failure with hypercapnia: Secondary | ICD-10-CM | POA: Diagnosis not present

## 2020-10-20 DIAGNOSIS — J9601 Acute respiratory failure with hypoxia: Secondary | ICD-10-CM | POA: Diagnosis not present

## 2020-10-20 MED ORDER — METOPROLOL TARTRATE 5 MG/5ML IV SOLN: 5.0000 mg | Freq: Four times a day (QID) | INTRAVENOUS | Status: DC | PRN

## 2020-10-20 NOTE — Progress Notes (Signed)
Daily Progress Note   Patient Name: Kimberly Ballard       Date: 10/20/2020 DOB: 08/29/1940  Age: 80 y.o. MRN#: [id number] Attending Physician: Rodolph Bong, MD Primary Care Physician: System, Provider Not In Admit Date: 09/22/2020  Reason for Consultation/Follow-up: Establishing goals of care  Subjective: [name] is now on the 3rd floor, appears weaker,  has ongoing gradual progressive decline, she did spend time with her family over the weekend.     See below.     Length of Stay: 28  Current Medications: Scheduled Meds:   bisacodyl  10 mg Rectal BID   budesonide (PULMICORT) nebulizer solution  0.5 mg Nebulization BID   chlorhexidine  15 mL Mouth Rinse BID   Chlorhexidine Gluconate Cloth  6 each Topical Daily   levalbuterol  1.25 mg Nebulization BID   liver oil-zinc oxide   Topical TID   mouth rinse  15 mL Mouth Rinse q12n4p    Continuous Infusions:  sodium chloride Stopped (10/15/20 2047)   chlorproMAZINE (THORAZINE) IV      PRN Meds: sodium chloride, acetaminophen **OR** acetaminophen, antiseptic oral rinse, chlorproMAZINE (THORAZINE) IV, diphenhydrAMINE, glycopyrrolate **OR** glycopyrrolate **OR** glycopyrrolate, haloperidol **OR** haloperidol **OR** haloperidol lactate, HYDROmorphone (DILAUDID) injection, LORazepam **OR** LORazepam **OR** LORazepam, magic mouthwash w/lidocaine, metoprolol tartrate, morphine CONCENTRATE **OR** morphine CONCENTRATE, nystatin, ondansetron **OR** ondansetron (ZOFRAN) IV, oxybutynin, polyvinyl alcohol, simethicone, sodium chloride           General: Alert, awake,     Heart: Tachycardic.   Lungs: Decreased air movement,   Abdomen: Soft, nontender, does appear mildly distended.    Ext: +edema Skin: Warm and dry Neuro: Grossly intact,  nonfocal. O2 via [place] currently.   Vital Signs: BP 102/68 (BP Location: Right Arm)   Pulse 83   Temp 97.7 F (36.5 C) (Oral)   Resp 17   Ht 5\' 3"  (1.6 m)   Wt 94.5 kg   SpO2 92%   BMI 36.90 kg/m  SpO2: SpO2: 92 % O2 Device: O2 Device: Nasal Cannula O2 Flow Rate: O2 Flow Rate (L/min): 3 L/min  Intake/output summary:  Intake/Output Summary (Last 24 hours) at 10/20/2020 1518 Last data filed at 10/20/2020 1020 Gross per 24 hour  Intake --  Output 700 ml  Net -700 ml   LBM: Last BM Date: 10/20/20 Baseline Weight:  Weight: 90.9 kg Most recent weight: Weight: 94.5 kg       Palliative Assessment/Data:    Flowsheet Rows    Flowsheet Row Most Recent Value  Intake Tab   Referral Department Hospitalist  Unit at Time of Referral ICU  Palliative Care Primary Diagnosis Sepsis/Infectious Disease  Date Notified 10/09/20  Palliative Care Type New Palliative care  Reason for referral Clarify Goals of Care  Date of Admission 09/22/20  Date first seen by Palliative Care 10/11/20  # of days Palliative referral response time 2 Day(s)  # of days IP prior to Palliative referral 17  Clinical Assessment   Palliative Performance Scale Score 30%  Psychosocial & Spiritual Assessment   Palliative Care Outcomes   Patient/Family meeting held? Yes  [name]       Patient Active Problem List   Diagnosis Date Noted   Pressure injury of skin 10/15/2020   Constipation    SBO (small bowel obstruction) (HCC)    Palliative care by specialist    Goals of care, counseling/discussion    General weakness    Chest pain    Shock circulatory (HCC)    Hypoxemia    Ileus (HCC)    RVF (right ventricular failure) (HCC)    Dyspnea    Acute and chronic respiratory failure (acute-on-chronic) (HCC)    Atrial fibrillation with RVR (HCC)    Elevated troponin 09/23/2020   CAP (community acquired pneumonia) 09/22/2020   Acute respiratory failure with hypoxia and hypercapnia (HCC)  09/22/2020   Septic shock (HCC) 09/22/2020   Elevated LFTs 09/22/2020   Cardiomegaly 09/22/2020    Palliative Care Assessment & Plan   Patient Profile: 80 y.o. female  with past medical history of COPD, A. fib on Eliquis, and former tobacco use admitted on 09/22/2020 with dyspnea, aches and cough after COVID exposure.  She has had a prolonged hospital course after developing progressive respiratory failure from which she has become high flow nasal cannula dependent.  She has been treated for multifocal pneumonia.  She is also intermittently requiring BiPAP.  She has developed generalized ileus.  She has diastolic heart failure and atrial fibrillation on amiodarone and diltiazem.  She has been hypotensive on IV pressors.  She has AKI which is improving.  Unfortunately, she is not making significant gains or improvement.  Palliative consulted for goals of care.  Recommendations/Plan: -Agree with DNR/DNI -Patient with an ongoing escalating symptom burden such as abdominal discomfort, shortness of breath sensation and with recent large bout of coffee-ground emesis earlier today. Abdominal imaging with CT results reviewed again: CT 9/13 with PSBO vs ileus, some mesenteric swirling noted in mid abdomen but contrast reaches to level of rectum which would argue against SBO.   Patient makes her wishes known clearly that she does not want surgery. She does not want intubation and mechanical ventilation.   She was in the process of completing her living will and other documents at her lawyer's office when she had to get admitted to the hospital. She does elect her daughter Wynona Canes to be her HCPOA agent. She has 3 children, she has 2 sons. Her husband died couple of months ago, at home, with hospice support.   Appreciate chaplain assistance with getting advanced directives notarized.    10-19-20: Comfort measures Residential hospice TOC consulted Continue current pain and non pain symptom management  measures Appreciate spiritual care Prognosis likely 2 weeks or less.  10-20-20:  Awaiting hospice bed.  Continue comfort  measures    Goals of Care and Additional Recommendations: Limitations on Scope of Treatment: comfort care.   Code Status:    Code Status Orders  (From admission, onward)           Start     Ordered   09/27/20 0929  Do not attempt resuscitation (DNR)  Continuous        09/27/20 0928           Code Status History     Date Active Date Inactive Code Status Order ID Comments User Context   09/22/2020 2144 09/27/2020 0928 Full Code [id number]  Therisa Doyne, MD Inpatient       Prognosis:  Less than 2 weeks in my opinion.   Discharge Planning:  residential hospice    Care plan was discussed with patient     Thank you for allowing the Palliative Medicine Team to assist in the care of this patient.   Total Time 15 Prolonged Time Billed  no       Greater than 50%  of this time was spent counseling and coordinating care related to the above assessment and plan.  Rosalin Hawking, MD  Please contact Palliative Medicine Team phone at [phone] for questions and concerns from 7 AM to 7 PM. After 7 PM, please call primary service.

## 2020-10-20 NOTE — Plan of Care (Signed)

## 2020-10-20 NOTE — TOC Progression Note (Signed)
Transition of Care [facility]) - Progression Note   Patient Details  Name: Kimberly Ballard MRN: [id number] Date of Birth: 1940-11-26  Transition of Care [facility]) CM/SW Contact  Ewing Schlein, LCSW Phone Number: 10/20/2020, 12:13 PM  Clinical Narrative: CSW spoke with Roda Shutters with [facility]. Per Cordelia Pen, the family was considering a bed at [facility] in [place] as there is a bed available today, but the family has decided to wait for a bed at [facility]. [facility] to notify TOC when a bed is available. TOC to follow.  Expected Discharge Plan: Hospice Medical Facility Barriers to Discharge: Hospice Bed not available  Expected Discharge Plan and Services Expected Discharge Plan: Hospice Medical Facility In-house Referral: Clinical Social Work, Hospice / Palliative Care Discharge Planning Services: CM Consult Post Acute Care Choice: Hospice Living arrangements for the past 2 months: Single Family Home  Readmission Risk Interventions No flowsheet data found.

## 2020-10-20 NOTE — Progress Notes (Signed)
Chaplain engaged in a follow-up appointment with Kimberly Ballard who did not have any needs at this time.  Chaplain offered support and was able to get a nurse to get her some ice chips.    Chaplain will follow-up.    10/20/20 1200  Clinical Encounter Type  Visited With Patient  Visit Type Follow-up;Spiritual support

## 2020-10-20 NOTE — Care Management Important Message (Signed)
Important Message  Patient Details IM Letter placed in Patient's room. Name: Kimberly Ballard MRN: [id number] Date of Birth: [birth year]   Medicare Important Message Given:  Yes     Caren Macadam 10/20/2020, 2:25 PM

## 2020-10-20 NOTE — Progress Notes (Signed)
PROGRESS NOTE    Kimberly Ballard  IRS:[id number] DOB: 1940-08-29 DOA: 09/22/2020 PCP: System, Provider Not In    Chief Complaint  Patient presents with   Shortness of Breath    Brief Narrative:  Kimberly Ballard is an 80 y.o. female with a history of COPD presented with shortness of breath.  hypoxic respiratory failure.  Reported COVID exposure but tested negative.  CTA chest ruled out pulmonary embolism but showed bilateral infiltrates, hospital course complicated by multiple issues including AKI, hematuria, a fib with RVR and ileus/obstruction. Multiple subspecialty have been following her, she does not appear to make any progress, palliative care consulted as well for goals of care discussion.  Plan for 9/13: repeat CT Scan.       Assessment & Plan:   Principal Problem:   Acute respiratory failure with hypoxia and hypercapnia (HCC) Active Problems:   Atrial fibrillation with RVR (HCC)   Ileus (HCC)   CAP (community acquired pneumonia)   Septic shock (HCC)   Elevated LFTs   Cardiomegaly   Elevated troponin   Acute and chronic respiratory failure (acute-on-chronic) (HCC)   Dyspnea   Hypoxemia   RVF (right ventricular failure) (HCC)   Shock circulatory (HCC)   Chest pain   Palliative care by specialist   Goals of care, counseling/discussion   General weakness   Pressure injury of skin   Constipation   SBO (small bowel obstruction) (HCC)  1 acute hypoxemic/hypercapnic respiratory failure with history of COPD -On presentation patient initially required BiPAP however currently on high flow nasal cannula at 3 L with sats of 91%. -CT angiogram chest done negative for PE, however concerning for multifocal pneumonia. -COVID-19 screening negative and serology negative, respiratory viral panel negative, autoimmune panel largely unremarkable, blood cultures with no growth to date. -Patient initially was treated with IV Rocephin and azithromycin and steroids with repeat chest x-ray  showing no significant improvement. -Procalcitonin noted to be elevated on 9/9 of 2.6 and patient placed on Vanco and cefepime. -Vancomycin discontinued. -Patient was seen and followed by PCCM, Dr. Marchelle Gearing who recommended cefepime x7 days. -Patient with no wheezing noted on examination.   -IV fluids saline lock.   -Patient on nebulizer treatments.   -Patient and family have decided to transition to full comfort measures and palliative care managing patient's pain and request made for residential hospice home.    2.  Severe constipation versus ileus versus small bowel obstruction -Patient seen in consultation by GI and general surgery was on NG tube for decompression which was subsequently removed per general surgery. -Patient noted to have large loose stools 10/15/2020 -NG tube removed 10/17/2020 patient with trial of clear liquids however seems not to have tolerated it as patient had a large bout of coffee-ground emesis 10/18/2020, per RN. -GI and general surgery following. -Concern for nutritional status, TPN being considered by general surgery however patient considering transitioning to comfort measures and as such TPN on hold for now until palliative can reassess patient for goals of care. -Abdominal films from 10/16/2020 with continued marked gaseous distention of the small bowel loops concerning for small bowel obstruction unchanged. -Patient back to n.p.o. status. -Per GI and general surgery. -Patient being followed by palliative care and decision made to transition to full comfort measures.    3.  A. fib with RVR and hypotension/shock -Was on Neo-Synephrine drip which has subsequently been discontinued. -Patient received a dose of IV albumin on 10/15/2020, another dose of IV albumin ordered per PCCM today  due to systolic blood pressures in the 80s.  -Currently on amiodarone drip per cardiology. -Daily EKG ordered per cardiology to follow-up on QTC as patient on amiodarone. -Amiodarone  150 mg bolus given on 10/15/2020 and patient currently on amiodarone drip . -Heart rate in the 110s but went up as high as 147.. -Was on IV heparin for anticoagulation which is currently being held due to concerns of coffee-ground emesis this morning. -Cardiology was following but as patient and family have decided to transition to full comfort measures IV amiodarone has been discontinued with cardiology recommended oral amiodarone 200 mg twice daily if patient able to take it or just discontinuation of amiodarone drip.  Cardiology signed off as of 10/19/2020. -Patient and family have decided to transition to full comfort measures  4.  Acute diastolic CHF/pulmonary hypertension -Due to soft borderline blood pressures diuretics on hold.  -Patient does not look volume overloaded on examination. -Patient however with some expiratory wheezes. -BNP noted to be elevated on presentation at 889 and trended down to 430. -2D echo with EF of 59%, moderately enlarged RV with mildly reduced systolic function at the base and mid segments with relatively preserved apical systolic function.- -CT angiogram chest negative for PE. -Urine output of 800 cc recorded over the past 24 hours noted. -Current weight of 208.34 pounds. -Cardiology recommending repeat 2D echo once respiratory issues have resolved. -Patient with coffee-ground emesis this morning, also noted to have systolic blood pressures in the 80s and placed on gentle hydration.   -Due to expiratory wheezes IV fluids were discontinued patient placed on nebulizer treatments.  -Status post IV albumin per PCCM during this hospitalization. -Cardiology was following but have signed off as of 10/19/2020. -Patient and family decided to transition to full comfort measures.  5.  Elevated troponin -Per cardiology likely secondary to demand ischemia. -Troponins mildly elevated peaked and trended down. -EKG changes of T wave abnormality in anterior leads noted. -2D  echo with  EF of 59%, moderately enlarged RV with mildly reduced systolic function at the base and mid segments with relatively preserved apical systolic function.- -Per cardiology coronary calcification noted on CT scan and recommending outpatient Lexiscan versus coronary CTA once patient has recovered from acute illness. -Patient and family have decided to transition to full comfort measures. -Cardiology was following but have signed off as of 10/19/2020.  6.  Gross hematuria -Seen in consultation by urology and patient underwent CBI. -Currently off CBI with resolution of hematuria. -Was on IV heparin which was discontinued due to coffee-ground emesis.  -Patient and family have decided to transition to full comfort measures.  7.  Yeast in urine -Status post full course Diflucan.  8.  Acute kidney injury on chronic kidney disease stage II -Resolved. -Avoid nephrotoxins, hypotension.  9.  Right middle lobe nodule -Patient and family decided to transition to full comfort measures.  10.  Hypokalemia -Potassium at 3.3 on 10/18/2020 and received 4 rounds of KCl. -Patient and family made decision to transition to full comfort measures and as such all labs discontinued.   -Comfort measures.    11.  Pressure injury, poa Pressure Injury 10/15/20 Buttocks Left;Medial;Upper Stage 2 -  Partial thickness loss of dermis presenting as a shallow open injury with a red, pink wound bed without slough. pink, red, yellow (Active)  10/15/20 1119  Location: Buttocks  Location Orientation: Left;Medial;Upper  Staging: Stage 2 -  Partial thickness loss of dermis presenting as a shallow open injury with a red, pink wound  bed without slough.  Wound Description (Comments): pink, red, yellow  Present on Admission: No     Pressure Injury 10/15/20 Buttocks Left;Medial;Lower Stage 2 -  Partial thickness loss of dermis presenting as a shallow open injury with a red, pink wound bed without slough. red, yellow, pink  (Active)  10/15/20 1120  Location: Buttocks  Location Orientation: Left;Medial;Lower  Staging: Stage 2 -  Partial thickness loss of dermis presenting as a shallow open injury with a red, pink wound bed without slough.  Wound Description (Comments): red, yellow, pink  Present on Admission: No     Pressure Injury 10/15/20 Buttocks Right;Medial Stage 2 -  Partial thickness loss of dermis presenting as a shallow open injury with a red, pink wound bed without slough. pink, red, yellow (Active)  10/15/20 1120  Location: Buttocks  Location Orientation: Right;Medial  Staging: Stage 2 -  Partial thickness loss of dermis presenting as a shallow open injury with a red, pink wound bed without slough.  Wound Description (Comments): pink, red, yellow  Present on Admission: No   12.  Goals of care -Patient with poor prognosis.  Patient not progressing or improving clinically.  Patient with another setback with large coffee-ground emesis this morning.  Patient has been reassessed by palliative care and patient was discussing with family to decide whether to transition to full comfort measures at this time.  Palliative care following and discussed with patient again, patient with no improvement with worsening abdominal distention and decision made to transition to full comfort measures with probable discharge to residential hospice per palliative care team when bed available..         DVT prophylaxis: Heparin discontinued due to coffee-ground emesis.  Patient now comfort measures. Code Status: DNR Family Communication: Updated patient.  Updated son at bedside.  Disposition:   Status is: Inpatient  Remains inpatient appropriate because:Inpatient level of care appropriate due to severity of illness  Dispo: The patient is from: Home              Anticipated d/c is to:  TBD              Patient currently is not medically stable to d/c.   Difficult to place patient No       Consultants:   Cardiology: Dr. Mayford Knife 09/24/2020 PCCM: Dr. Marchelle Gearing 09/23/2020 Urology: Dr. Alvester Morin 10/01/2020 -Wound care RN General surgery: Dr. Daphine Deutscher 10/07/2020 Gastroenterology: Dr. Levora Angel 10/10/2020 Palliative care: Dr. Neale Burly 19 2022       Procedures:  CT angiogram chest 09/22/2020, 09/24/2020 CT abdomen and pelvis 09/30/2020, 10/08/2020, 10/14/2020 Abdominal films 10/16/2020, 10/14/2020, 10/13/2020, 10/12/2020, 10/10/2020, 10/09/2020, 10/08/2020, 10/07/2020, 09/30/2020, Chest x-ray 09/25/2020, 09/26/2020, 09/27/2020, 09/29/2020, 10/04/2020, 10/10/2020 Abdominal ultrasound 09/23/2020, 2D echo 09/23/2020, 10/01/2020 Lower extremity Dopplers 09/24/2020  Antimicrobials:  Anti-infectives (From admission, onward)    Start     Dose/Rate Route Frequency Ordered Stop   10/14/20 0815  ceFEPIme (MAXIPIME) 2 g in sodium chloride 0.9 % 100 mL IVPB        2 g 200 mL/hr over 30 Minutes Intravenous Every 8 hours 10/14/20 0718 10/16/20 2200   10/12/20 1200  ceFEPIme (MAXIPIME) 2 g in sodium chloride 0.9 % 100 mL IVPB  Status:  Discontinued        2 g 200 mL/hr over 30 Minutes Intravenous Every 12 hours 10/12/20 1133 10/14/20 0718   10/10/20 2000  ceFEPIme (MAXIPIME) 2 g in sodium chloride 0.9 % 100 mL IVPB  Status:  Discontinued  2 g 200 mL/hr over 30 Minutes Intravenous Every 24 hours 10/10/20 1812 10/12/20 1133   10/10/20 1930  vancomycin (VANCOREADY) IVPB 1500 mg/300 mL        1,500 mg 150 mL/hr over 120 Minutes Intravenous  Once 10/10/20 1812 10/10/20 2045   10/10/20 1838  vancomycin variable dose per unstable renal function (pharmacist dosing)  Status:  Discontinued         Does not apply See admin instructions 10/10/20 1838 10/12/20 0834   10/09/20 1000  fluconazole (DIFLUCAN) tablet 200 mg        200 mg Per Tube Daily 10/09/20 0826 10/10/20 0903   10/08/20 1000  fluconazole (DIFLUCAN) tablet 200 mg  Status:  Discontinued        200 mg Oral Daily 10/07/20 1426 10/09/20 0826   10/03/20 1700  fluconazole (DIFLUCAN)  tablet 200 mg  Status:  Discontinued        200 mg Oral Daily 10/03/20 1606 10/07/20 1426   09/23/20 1800  cefTRIAXone (ROCEPHIN) 2 g in sodium chloride 0.9 % 100 mL IVPB        2 g 200 mL/hr over 30 Minutes Intravenous Every 24 hours 09/22/20 1842 09/29/20 1749   09/23/20 1800  azithromycin (ZITHROMAX) 500 mg in sodium chloride 0.9 % 250 mL IVPB  Status:  Discontinued        500 mg 250 mL/hr over 60 Minutes Intravenous Every 24 hours 09/22/20 1842 09/25/20 1456   09/22/20 1630  cefTRIAXone (ROCEPHIN) 1 g in sodium chloride 0.9 % 100 mL IVPB        1 g 200 mL/hr over 30 Minutes Intravenous  Once 09/22/20 1618 09/22/20 1806   09/22/20 1630  azithromycin (ZITHROMAX) 500 mg in sodium chloride 0.9 % 250 mL IVPB        500 mg 250 mL/hr over 60 Minutes Intravenous  Once 09/22/20 1618 09/22/20 1939         Subjective: Laying in bed.  Denies any chest pain.  No shortness of breath.  No palpitations.  No significant abdominal pain.  Stated had some liquid stool and has been having some flatus.   Objective: Vitals:   10/19/20 1600 10/19/20 1605 10/20/20 0800 10/20/20 0957  BP:  115/73  102/68  Pulse:  (!) 107  83  Resp:  17  17  Temp: 97.6 F (36.4 C)   97.7 F (36.5 C)  TempSrc: Oral   Oral  SpO2:  94% 93% 92%  Weight:      Height:        Intake/Output Summary (Last 24 hours) at 10/20/2020 1106 Last data filed at 10/20/2020 1020 Gross per 24 hour  Intake --  Output 700 ml  Net -700 ml    Filed Weights   10/16/20 0100 10/16/20 1000 10/18/20 1000  Weight: 94.1 kg 94.1 kg 94.5 kg    Examination:  General exam: NAD. Respiratory system: CTA B anterior lung fields.  No crackles, no rhonchi.  Cardiovascular system: Irregularly irregular No JVD, no murmurs rubs or gallops.  No pitting lower extremity edema.    Gastrointestinal system: Abdomen is soft, slightly less distended today, hypoactive bowel sounds, nontender to palpation, no rebound, no guarding.  Central nervous system:  Alert and oriented.  Moving extremities spontaneously.  No focal neurological deficits.   Extremities: Symmetric 5 x 5 power. Skin: No rashes, lesions or ulcers Psychiatry: Judgement and insight appear fair. Mood & affect appropriate.     Data Reviewed: I have personally  reviewed following labs and imaging studies  CBC: Recent Labs  Lab 10/14/20 0250 10/15/20 0314 10/16/20 0408 10/17/20 0252 10/18/20 0849  WBC 6.9 6.0 6.4 5.4 5.4  NEUTROABS  --   --   --  4.1 4.0  HGB 13.6 12.5 13.1 12.5 12.5  HCT 39.7 36.3 38.9 36.9 37.2  MCV 93.2 91.7 91.7 92.9 92.3  PLT 155 137* 117* 97* 97*     Basic Metabolic Panel: Recent Labs  Lab 10/14/20 0250 10/15/20 0314 10/16/20 0408 10/17/20 0252 10/18/20 0849  NA 131* 131* 130* 135 133*  K 3.9 3.2* 4.0 3.6 3.3*  CL 99 97* 100 106 107  CO2 23 24 24  21* 21*  GLUCOSE 120* 134* 112* 97 90  BUN 32* 22 19 15 13   CREATININE 0.52 0.37* 0.44 0.50 0.44  CALCIUM 7.8* 7.9* 7.7* 7.9* 7.8*  MG  --  2.0 2.0 1.9 1.8     GFR: Estimated Creatinine Clearance: 62.3 mL/min (by C-G formula based on SCr of 0.44 mg/dL).  Liver Function Tests: Recent Labs  Lab 10/14/20 0250 10/15/20 0314 10/16/20 0408 10/17/20 0252 10/18/20 0849  AST 48* 44* 57* 33 23  ALT 80* 76* 90* 70* 56*  ALKPHOS 57 51 63 55 56  BILITOT 1.1 1.1 1.2 1.1 1.0  PROT 5.4* 5.3* 5.3* 5.4* 5.0*  ALBUMIN 2.2* 2.4* 2.4* 2.6* 2.4*     CBG: Recent Labs  Lab 10/17/20 1355 10/17/20 1620 10/17/20 2155 10/18/20 0803 10/18/20 1154  GLUCAP 80 88 86 89 93      No results found for this or any previous visit (from the past 240 hour(s)).       Radiology Studies: No results found.      Scheduled Meds:  bisacodyl  10 mg Rectal BID   budesonide (PULMICORT) nebulizer solution  0.5 mg Nebulization BID   chlorhexidine  15 mL Mouth Rinse BID   Chlorhexidine Gluconate Cloth  6 each Topical Daily   levalbuterol  1.25 mg Nebulization BID   liver oil-zinc oxide   Topical  TID   mouth rinse  15 mL Mouth Rinse q12n4p   Continuous Infusions:  sodium chloride Stopped (10/15/20 2047)   chlorproMAZINE (THORAZINE) IV       LOS: 28 days    Time spent: 35 minutes    10/17/20, MD [facility]   To contact the attending provider between 7A-7P or the covering provider during after hours 7P-7A, please log into the web site [email or web address] and access using universal [place] password for that web site. If you do not have the password, please call the hospital operator.  10/20/2020, 11:06 AM

## 2020-10-20 NOTE — Progress Notes (Signed)
Civil engineer, contracting [facility]) Hospital Liaison Note:  Update on this [facility] Referral:  [facility] is unfortunately not able to offer a room today. The [facility] did have a bed available.  After much discussion, the family declined the [facility]ospice Home bed wanting to wait for a bed offer from [facility] due to the proximity of [facility] to family/friends and their past experiences with [facility].   Family and TOC are aware [facility] Liaison will follow up with [facility] and family tomorrow or sooner if [facility] room becomes available.  Please do not hesitate to call with questions.  Thank you.   Roda Shutters, RN [facility] Liaison  [phone]

## 2020-10-21 DIAGNOSIS — Z515 Encounter for palliative care: Secondary | ICD-10-CM | POA: Diagnosis not present

## 2020-10-21 DIAGNOSIS — K567 Ileus, unspecified: Secondary | ICD-10-CM | POA: Diagnosis not present

## 2020-10-21 DIAGNOSIS — J9601 Acute respiratory failure with hypoxia: Secondary | ICD-10-CM | POA: Diagnosis not present

## 2020-10-21 DIAGNOSIS — J9620 Acute and chronic respiratory failure, unspecified whether with hypoxia or hypercapnia: Secondary | ICD-10-CM | POA: Diagnosis not present

## 2020-10-21 DIAGNOSIS — J189 Pneumonia, unspecified organism: Secondary | ICD-10-CM | POA: Diagnosis not present

## 2020-10-21 DIAGNOSIS — I4891 Unspecified atrial fibrillation: Secondary | ICD-10-CM | POA: Diagnosis not present

## 2020-10-21 DIAGNOSIS — Z7189 Other specified counseling: Secondary | ICD-10-CM | POA: Diagnosis not present

## 2020-10-21 MED ORDER — ZINC OXIDE 40 % EX OINT: TOPICAL_OINTMENT | Freq: Three times a day (TID) | CUTANEOUS | 0 refills | Status: AC

## 2020-10-21 MED ORDER — OXYBUTYNIN CHLORIDE 5 MG PO TABS: 2.5000 mg | ORAL_TABLET | Freq: Four times a day (QID) | ORAL | 0 refills | Status: AC | PRN

## 2020-10-21 MED ORDER — ONDANSETRON 4 MG PO TBDP: 4.0000 mg | ORAL_TABLET | Freq: Four times a day (QID) | ORAL | 0 refills | Status: AC | PRN

## 2020-10-21 MED ORDER — LEVALBUTEROL HCL 1.25 MG/0.5ML IN NEBU: 1.2500 mg | INHALATION_SOLUTION | Freq: Two times a day (BID) | RESPIRATORY_TRACT | 12 refills | Status: AC

## 2020-10-21 MED ORDER — MORPHINE SULFATE (CONCENTRATE) 10 MG/0.5ML PO SOLN: 5.0000 mg | ORAL | 0 refills | Status: AC | PRN

## 2020-10-21 MED ORDER — BUDESONIDE 0.5 MG/2ML IN SUSP: 0.5000 mg | Freq: Two times a day (BID) | RESPIRATORY_TRACT | 0 refills | Status: AC

## 2020-10-21 MED ORDER — BISACODYL 10 MG RE SUPP: 10.0000 mg | Freq: Two times a day (BID) | RECTAL | 0 refills | Status: AC

## 2020-10-21 MED ORDER — ACETAMINOPHEN 325 MG PO TABS: 650.0000 mg | ORAL_TABLET | Freq: Four times a day (QID) | ORAL | Status: AC | PRN

## 2020-10-21 MED ORDER — GLYCOPYRROLATE 1 MG PO TABS: 1.0000 mg | ORAL_TABLET | ORAL | 0 refills | Status: AC | PRN

## 2020-10-21 MED ORDER — HALOPERIDOL 0.5 MG PO TABS: 0.5000 mg | ORAL_TABLET | ORAL | 0 refills | Status: AC | PRN

## 2020-10-21 MED ORDER — LORAZEPAM 2 MG/ML PO CONC: 1.0000 mg | ORAL | 0 refills | Status: AC | PRN

## 2020-10-21 MED ORDER — NYSTATIN 100000 UNIT/GM EX POWD: Freq: Three times a day (TID) | CUTANEOUS | 0 refills | Status: AC | PRN

## 2020-10-21 NOTE — Progress Notes (Signed)
Daily Progress Note   Patient Name: Kimberly Ballard       Date: 10/21/2020 DOB: 09-11-1940  Age: 80 y.o. MRN#: [id number] Attending Physician: Rodolph Bong, MD Primary Care Physician: System, Provider Not In Admit Date: 09/22/2020  Reason for Consultation/Follow-up: Establishing goals of care  Subjective: I saw and examined Kimberly Ballard.  She was lying in bed in no distress.  She appears very weak compared to last time that I saw her.  She denies any complaints today reports that she is not hurting or short of breath.  We discussed plan to transition to [facility] for end-of-life care.      Length of Stay: 29  Current Medications: Scheduled Meds:   bisacodyl  10 mg Rectal BID   budesonide (PULMICORT) nebulizer solution  0.5 mg Nebulization BID   chlorhexidine  15 mL Mouth Rinse BID   Chlorhexidine Gluconate Cloth  6 each Topical Daily   levalbuterol  1.25 mg Nebulization BID   liver oil-zinc oxide   Topical TID   mouth rinse  15 mL Mouth Rinse q12n4p    Continuous Infusions:  sodium chloride Stopped (10/15/20 2047)   chlorproMAZINE (THORAZINE) IV      PRN Meds: sodium chloride, acetaminophen **OR** acetaminophen, antiseptic oral rinse, chlorproMAZINE (THORAZINE) IV, diphenhydrAMINE, glycopyrrolate **OR** glycopyrrolate **OR** glycopyrrolate, haloperidol **OR** haloperidol **OR** haloperidol lactate, HYDROmorphone (DILAUDID) injection, LORazepam **OR** LORazepam **OR** LORazepam, magic mouthwash w/lidocaine, metoprolol tartrate, morphine CONCENTRATE **OR** morphine CONCENTRATE, nystatin, ondansetron **OR** ondansetron (ZOFRAN) IV, oxybutynin, polyvinyl alcohol, simethicone, sodium chloride           General: Alert, awake,     Heart: Tachycardic.   Lungs: Decreased air  movement,   Abdomen: Soft, nontender, does appear mildly distended.    Ext: +edema Skin: Warm and dry Neuro: Grossly intact, nonfocal. O2 via  currently.   Vital Signs: BP 101/87 (BP Location: Right Arm)   Pulse 99   Temp 98.1 F (36.7 C) (Oral)   Resp 16   Ht 5\' 3"  (1.6 m)   Wt 94.5 kg   SpO2 92%   BMI 36.90 kg/m  SpO2: SpO2: 92 % O2 Device: O2 Device: Nasal Cannula O2 Flow Rate: O2 Flow Rate (L/min): 3 L/min  Intake/output summary:  Intake/Output Summary (Last 24 hours) at 10/21/2020 1526 Last data filed at 10/21/2020  1025 Gross per 24 hour  Intake 190 ml  Output 360 ml  Net -170 ml   LBM: Last BM Date: 10/20/20 Baseline Weight: Weight: 90.9 kg Most recent weight: Weight: 94.5 kg       Palliative Assessment/Data:    Flowsheet Rows    Flowsheet Row Most Recent Value  Intake Tab   Referral Department Hospitalist  Unit at Time of Referral ICU  Palliative Care Primary Diagnosis Sepsis/Infectious Disease  Date Notified 10/09/20  Palliative Care Type New Palliative care  Reason for referral Clarify Goals of Care  Date of Admission 09/22/20  Date first seen by Palliative Care 10/11/20  # of days Palliative referral response time 2 Day(s)  # of days IP prior to Palliative referral 17  Clinical Assessment   Palliative Performance Scale Score 30%  Psychosocial & Spiritual Assessment   Palliative Care Outcomes   Patient/Family meeting held? Yes  [name]       Patient Active Problem List   Diagnosis Date Noted   Pressure injury of skin 10/15/2020   Constipation    SBO (small bowel obstruction) (HCC)    Palliative care by specialist    Goals of care, counseling/discussion    General weakness    Chest pain    Shock circulatory (HCC)    Hypoxemia    Ileus (HCC)    RVF (right ventricular failure) (HCC)    SOB (shortness of breath)    Acute and chronic respiratory failure (acute-on-chronic) (HCC)    Atrial fibrillation with RVR  (HCC)    Elevated troponin 09/23/2020   CAP (community acquired pneumonia) 09/22/2020   Acute respiratory failure with hypoxia and hypercapnia (HCC) 09/22/2020   Septic shock (HCC) 09/22/2020   Elevated LFTs 09/22/2020   Cardiomegaly 09/22/2020    Palliative Care Assessment & Plan   Patient Profile: 80 y.o. female  with past medical history of COPD, A. fib on Eliquis, and former tobacco use admitted on 09/22/2020 with dyspnea, aches and cough after COVID exposure.  She has had a prolonged hospital course after developing progressive respiratory failure from which she has become high flow nasal cannula dependent.  She has been treated for multifocal pneumonia.  She is also intermittently requiring BiPAP.  She has developed generalized ileus.  She has diastolic heart failure and atrial fibrillation on amiodarone and diltiazem.  She has been hypotensive on IV pressors.  She has AKI which is improving.  Unfortunately, she is not making significant gains or improvement.  Palliative consulted for goals of care.  Recommendations/Plan: -Agree with DNR/DNI -I discussed with patient and also called and discussed with her daughter, Thayer Ohm.  Thayer Ohm is on her way into town to complete paperwork for transition to [facility]. -Plan for transition to residential hospice for end-of-life care.  She appears stable for transport to [facility] today.   Goals of Care and Additional Recommendations: Limitations on Scope of Treatment: comfort care.   Code Status:    Code Status Orders  (From admission, onward)           Start     Ordered   09/27/20 0929  Do not attempt resuscitation (DNR)  Continuous        09/27/20 0928           Code Status History     Date Active Date Inactive Code Status Order ID Comments User Context   09/22/2020 2144 09/27/2020 0928 Full Code [id number]  Therisa Doyne, MD Inpatient  Prognosis: < 2 weeks most likely  Discharge Planning:  residential hospice     Care plan was discussed with patient     Thank you for allowing the Palliative Medicine Team to assist in the care of this patient.   Total Time 20 Prolonged Time Billed  no    Greater than 50%  of this time was spent counseling and coordinating care related to the above assessment and plan.   Romie Minus, MD  Please contact Palliative Medicine Team phone at [phone] for questions and concerns from 7 AM to 7 PM. After 7 PM, please call primary service.

## 2020-10-21 NOTE — Progress Notes (Signed)
Occupational Therapy Discharge Patient Details Name: Kimberly Ballard MRN: [id number] DOB: 1940/02/28 Today's Date: 10/21/2020 Time:  -     Patient discharged from OT services secondary to medical decline - will need to re-order OT to resume therapy services. Per chart review plan is to go hospice.  Please see latest therapy progress note for current level of functioning and progress toward goals.    [name] OT OT pager: [phone]    GO     [name] 10/21/2020, 6:18 AM

## 2020-10-21 NOTE — Progress Notes (Signed)
[facility] Hospital Liaison note.     This patient is approved to transfer to [facility] today.    [facility] will notify TOC when registration paperwork has been completed to arrange transport.    RN please call report to [phone].   Thank you,     Mary Anne Robertson, RN, CCM       [facility] Hospital Liaison  [phone] 

## 2020-10-21 NOTE — Progress Notes (Addendum)
Transferred to [facility] via [facility] at 2120.  All ointments and care items placed in patient belongings bag for transport.  Denies pain, Foley intact, SL intact L arm X 2, O2 3L N/C, Irregular heart rhythm gave initial elevated rate, VS stable.  Informed [facility] that she did not receive scheduled Dulcolax Supp tonight.  Patient Refused that and to take Prevalon boots with her. END

## 2020-10-21 NOTE — Progress Notes (Signed)
Civil engineer, contracting [facility]) Hospital Liaison note.      Consents are complete. Patient can transfer to [facility].   RN please call report to [phone].    Thank you,     Elsie Saas, RN, [facility]       [facility] Liaison  [phone]

## 2020-10-21 NOTE — TOC Transition Note (Signed)
Transition of Care [facility]) - CM/SW Discharge Note   Patient Details  Name: LAYCI STENGLEIN MRN: [id number] Date of Birth: May 11, 1940  Transition of Care [facility]) CM/SW Contact:  Amada Jupiter, LCSW Phone Number: 10/21/2020, 2:33 PM   Clinical Narrative:    Alerted of bed available today at [facility] and daughter has completed paperwork.  [facility] called at 2:30.  RN to call report to [phone].  No further TOC needs.   Final next level of care: Hospice Medical Facility Barriers to Discharge: Barriers Resolved   Patient Goals and CMS Choice Patient states their goals for this hospitalization and ongoing recovery are:: Discharge to [facility] Medicare.gov Compare Post Acute Care list provided to:: Patient Choice offered to / list presented to : Patient, Adult Children  Discharge Placement                Patient to be transferred to facility by: [facility] Name of family member notified: daughter Patient and family notified of of transfer: 10/21/20  Discharge Plan and Services In-house Referral: Clinical Social Work, Hospice / Palliative Care Discharge Planning Services: CM Consult Post Acute Care Choice: Hospice          DME Arranged: N/A DME Agency: NA                  Social Determinants of Health (SDOH) Interventions     Readmission Risk Interventions No flowsheet data found.

## 2020-10-21 NOTE — Progress Notes (Signed)
Nutrition Brief Note  Patient last assessed by a RD on 9/15. Chart reviewed. Palliative Care is following patient and last saw her yesterday evening. Note from that time states that patient is comfort measures, pending transfer to residential hospice, and that prognosis is </= 2 weeks.   She is NPO with sips of clear liquids allowed.  No further nutrition interventions planned at this time. Please re-consult as needed.       Trenton Gammon, MS, RD, LDN, CNSC Inpatient Clinical Dietitian RD pager # available in [facility]  After hours/weekend pager # available in [facility]

## 2020-10-21 NOTE — Discharge Summary (Signed)
Physician Discharge Summary  Kimberly Ballard:[id number] DOB: 17-Feb-1940 DOA: 09/22/2020  PCP: System, Provider Not In  Admit date: 09/22/2020 Discharge date: 10/21/2020  Time spent: 55 minutes  Recommendations for Outpatient Follow-up:  Patient be discharged to residential hospice home at [facility].  Follow-up with MD at [facility]   Discharge Diagnoses:  Principal Problem:   Acute respiratory failure with hypoxia and hypercapnia (HCC) Active Problems:   Atrial fibrillation with RVR (HCC)   Ileus (HCC)   CAP (community acquired pneumonia)   Septic shock (HCC)   Elevated LFTs   Cardiomegaly   Elevated troponin   Acute and chronic respiratory failure (acute-on-chronic) (HCC)   Dyspnea   Hypoxemia   RVF (right ventricular failure) (HCC)   Shock circulatory (HCC)   Chest pain   Palliative care by specialist   Goals of care, counseling/discussion   General weakness   Pressure injury of skin   Constipation   SBO (small bowel obstruction) (HCC)   Discharge Condition: Stable  Diet recommendation: Comfort feeds  Filed Weights   10/16/20 0100 10/16/20 1000 10/18/20 1000  Weight: 94.1 kg 94.1 kg 94.5 kg    History of present illness:  HPI per Dr. Casilda Carls is a 80 y.o. female with medical history significant of COPD     Presented with  SOB, body aches decreased appetite coughing.  Her daughter had COVID last week.  Patient went to her primary care provider on 18 August was started on Paxlovid empirically Due to risk of exposure.  She continued to progress and feel poorly at which point she called EMS on their arrival patient was satting 78% on room air started on 6 L and improved up to 94 Patient is not vaccinated for COVID No nausea no vomiting no diarrhea no loss of taste or smell.  No urinary complaints.   When she was prescribed Paxlovid she was told to stop her inhalers She used to smoke but quit Drinks EtOH occasionally Pt does not see doctors  very often   Infectious risk factors:  Reports  fever, shortness of breath, dry cough,     Has   been vaccinated against COVID     Initial COVID TEST  NEGATIVE    Recent Labs       Lab Results  Component Value Date    SARSCOV2NAA NEGATIVE 09/22/2020        Regarding pertinent Chronic problems:      COPD - not  followed by pulmonology      Recent Labs (within last 365 days)     Recent Labs    09/22/20 1441  HGB 15.6*      While in ER: Patient tested negative for COVID chest x-ray showed bilateral infiltrates CTA negative for PE but with evidence of bilateral infiltrates also noted elevated white blood cell count patient started on Rocephin azithromycin for CAP coverage   Hospital Course:  1 acute hypoxemic/hypercapnic respiratory failure with history of COPD -On presentation patient initially required BiPAP however currently on high flow nasal cannula at 3 L with sats of 91%. -CT angiogram chest done negative for PE, however concerning for multifocal pneumonia. -COVID-19 screening negative and serology negative, respiratory viral panel negative, autoimmune panel largely unremarkable, blood cultures with no growth to date. -Patient initially was treated with IV Rocephin and azithromycin and steroids with repeat chest x-ray showing no significant improvement. -Procalcitonin noted to be elevated on 9/9 of 2.6 and patient placed on Vanco and cefepime. -  Vancomycin discontinued. -Patient was seen and followed by PCCM, Dr. Marchelle Gearing who recommended cefepime x7 days. -Patient with no wheezing noted on examination.   -IV fluids saline lock.   -Patient on nebulizer treatments.   -Patient and family have decided to transition to full comfort measures and palliative care managing patient's pain and request made for residential hospice home.   -Patient will be discharged to residential hospice home at [facility]  2.  Severe constipation versus ileus versus small bowel  obstruction -Patient seen in consultation by GI and general surgery was on NG tube for decompression which was subsequently removed per general surgery. -Patient noted to have large loose stools 10/15/2020 -NG tube removed 10/17/2020 patient with trial of clear liquids however seems not to have tolerated it as patient had a large bout of coffee-ground emesis 10/18/2020, per RN. -GI and general surgery was following. -Concern for nutritional status, TPN being considered by general surgery however patient considering transitioning to comfort measures and as such TPN on hold for now until palliative can reassess patient for goals of care. -Abdominal films from 10/16/2020 with continued marked gaseous distention of the small bowel loops concerning for small bowel obstruction unchanged. -Patient back to n.p.o. status. -Per GI and general surgery. -Patient being followed by palliative care and decision made to transition to full comfort measures.   -Patient with discharge to residential hospice home at [facility].  3.  A. fib with RVR and hypotension/shock -Was on Neo-Synephrine drip which has subsequently been discontinued. -Patient received a dose of IV albumin on 10/15/2020, another dose of IV albumin ordered per PCCM today due to systolic blood pressures in the 80s.  -Was on amiodarone drip per cardiology. -Daily EKG ordered per cardiology to follow-up on QTC as patient on amiodarone. -Amiodarone 150 mg bolus given on 10/15/2020 and patient on amiodarone drip as well at that time. -Heart rate in the 110s but went up as high as 147.. -Was on IV heparin for anticoagulation which was currently being held due to concerns of coffee-ground emesis. -Cardiology was following but as patient and family have decided to transition to full comfort measures IV amiodarone has been discontinued with cardiology recommended oral amiodarone 200 mg twice daily if patient able to take it or just discontinuation of  amiodarone drip.  Cardiology signed off as of 10/19/2020. -Patient and family have decided to transition to full comfort measures. -Patient will be discharged to residential hospice home at [facility].  4.  Acute diastolic CHF/pulmonary hypertension -Due to soft borderline blood pressures diuretics on hold.  -Patient does not look volume overloaded on examination. -Patient however with some expiratory wheezes which improved with nebulizer treatments.. -BNP noted to be elevated on presentation at 889 and trended down to 430. -2D echo with EF of 59%, moderately enlarged RV with mildly reduced systolic function at the base and mid segments with relatively preserved apical systolic function.- -CT angiogram chest negative for PE. -Patient with good urine output during the hospitalization.  -Cardiology recommending repeat 2D echo once respiratory issues have resolved. -Patient with coffee-ground emesis during the hospitalization, also noted to have systolic blood pressures in the 80s and placed on gentle hydration.   -Due to expiratory wheezes IV fluids were discontinued patient placed on nebulizer treatments.  -Status post IV albumin per PCCM during this hospitalization. -Cardiology was following but have signed off as of 10/19/2020. -Patient and family decided to transition to full comfort measures. -Patient will be discharged to residential hospice home  at [facility]  5.  Elevated troponin -Per cardiology likely secondary to demand ischemia. -Troponins mildly elevated peaked and trended down. -EKG changes of T wave abnormality in anterior leads noted. -2D echo with  EF of 59%, moderately enlarged RV with mildly reduced systolic function at the base and mid segments with relatively preserved apical systolic function.- -Per cardiology coronary calcification noted on CT scan and recommending outpatient Lexiscan versus coronary CTA once patient has recovered from acute illness. -Patient and  family have decided to transition to full comfort measures. -Cardiology was following but have signed off as of 10/19/2020. -Patient be discharged to residential hospice home at [facility]  6.  Gross hematuria -Seen in consultation by urology and patient underwent CBI. -Currently off CBI with resolution of hematuria. -Was on IV heparin which was discontinued due to coffee-ground emesis.  -Patient and family have decided to transition to full comfort measures.  7.  Yeast in urine -Status post full course Diflucan.  8.  Acute kidney injury on chronic kidney disease stage II -Resolved. -Avoid nephrotoxins, hypotension.  9.  Right middle lobe nodule -Patient and family decided to transition to full comfort measures.  10.  Hypokalemia -Potassium at 3.3 on 10/18/2020 and received 4 rounds of KCl. -Patient and family made decision to transition to full comfort measures and as such all labs discontinued.   -Comfort measures.   -Patient will be discharged to residential hospice home at [facility].   11.  Pressure injury, poa Pressure Injury 10/15/20 Buttocks Left;Medial;Upper Stage 2 -  Partial thickness loss of dermis presenting as a shallow open injury with a red, pink wound bed without slough. pink, red, yellow (Active)  10/15/20 1119  Location: Buttocks  Location Orientation: Left;Medial;Upper  Staging: Stage 2 -  Partial thickness loss of dermis presenting as a shallow open injury with a red, pink wound bed without slough.  Wound Description (Comments): pink, red, yellow  Present on Admission: No     Pressure Injury 10/15/20 Buttocks Left;Medial;Lower Stage 2 -  Partial thickness loss of dermis presenting as a shallow open injury with a red, pink wound bed without slough. red, yellow, pink (Active)  10/15/20 1120  Location: Buttocks  Location Orientation: Left;Medial;Lower  Staging: Stage 2 -  Partial thickness loss of dermis presenting as a shallow open injury with a red, pink  wound bed without slough.  Wound Description (Comments): red, yellow, pink  Present on Admission: No     Pressure Injury 10/15/20 Buttocks Right;Medial Stage 2 -  Partial thickness loss of dermis presenting as a shallow open injury with a red, pink wound bed without slough. pink, red, yellow (Active)  10/15/20 1120  Location: Buttocks  Location Orientation: Right;Medial  Staging: Stage 2 -  Partial thickness loss of dermis presenting as a shallow open injury with a red, pink wound bed without slough.  Wound Description (Comments): pink, red, yellow  Present on Admission: No    12.  Goals of care -Patient with poor prognosis.  Patient not progressing or improving clinically.  Patient with another setback with large coffee-ground emesis this morning.  Patient has been reassessed by palliative care and patient was discussing with family to decide whether to transition to full comfort measures at this time.  Palliative care following and discussed with patient again, patient with no improvement with worsening abdominal distention and decision made to transition to full comfort measures with discharge to [facility]/residential hospice home today.      Procedures: CT  angiogram chest 09/22/2020, 09/24/2020 CT abdomen and pelvis 09/30/2020, 10/08/2020, 10/14/2020 Abdominal films 10/16/2020, 10/14/2020, 10/13/2020, 10/12/2020, 10/10/2020, 10/09/2020, 10/08/2020, 10/07/2020, 09/30/2020, Chest x-ray 09/25/2020, 09/26/2020, 09/27/2020, 09/29/2020, 10/04/2020, 10/10/2020 Abdominal ultrasound 09/23/2020, 2D echo 09/23/2020, 10/01/2020 Lower extremity Dopplers 09/24/2020  Consultations: Cardiology: Dr. Mayford Knife 09/24/2020 PCCM: Dr. Marchelle Gearing 09/23/2020 Urology: Dr. Alvester Morin 10/01/2020 -Wound care RN General surgery: Dr. Daphine Deutscher 10/07/2020 Gastroenterology: Dr. Levora Angel 10/10/2020 Palliative care: Dr. Neale Burly 19 2022   Discharge Exam: Vitals:   10/20/20 2039 10/21/20 0752  BP: 101/87   Pulse: 99   Resp: 16   Temp: 98.1 F (36.7 C)    SpO2: 94% 92%    General: NAD. Cardiovascular: Irregularly irregular.  No JVD.  No murmurs rubs or gallops.  Nonpitting edema. Respiratory: Clear to auscultation bilaterally anterior lung fields.  Discharge Instructions   Discharge Instructions     Discharge wound care:   Complete by: As directed    As above.   Increase activity slowly   Complete by: As directed       Allergies as of 10/21/2020   No Known Allergies      Medication List     STOP taking these medications    aspirin 81 MG EC tablet   Paxlovid (300/100) 20 x 150 MG & 10 x 100MG  Tbpk Generic drug: nirmatrelvir & ritonavir       TAKE these medications    acetaminophen 325 MG tablet Commonly known as: TYLENOL Take 2 tablets (650 mg total) by mouth every 6 (six) hours as needed for mild pain (or Fever >/= 101).   bisacodyl 10 MG suppository Commonly known as: DULCOLAX Place 1 suppository (10 mg total) rectally 2 (two) times daily.   budesonide 0.5 MG/2ML nebulizer solution Commonly known as: PULMICORT Take 2 mLs (0.5 mg total) by nebulization 2 (two) times daily.   glycopyrrolate 1 MG tablet Commonly known as: ROBINUL Take 1 tablet (1 mg total) by mouth every 4 (four) hours as needed (excessive secretions).   haloperidol 0.5 MG tablet Commonly known as: HALDOL Take 1 tablet (0.5 mg total) by mouth every 4 (four) hours as needed for agitation (or delirium).   levalbuterol 1.25 MG/0.5ML nebulizer solution Commonly known as: XOPENEX Take 1.25 mg by nebulization 2 (two) times daily.   liver oil-zinc oxide 40 % ointment Commonly known as: DESITIN Apply topically 3 (three) times daily. Apply to buttocks and perineal area three times daily and PRN after turns   LORazepam 2 MG/ML concentrated solution Commonly known as: ATIVAN Place 0.5 mLs (1 mg total) under the tongue every 4 (four) hours as needed for anxiety.   morphine CONCENTRATE 10 MG/0.5ML Soln concentrated solution Place 0.25 mLs (5  mg total) under the tongue every 2 (two) hours as needed for moderate pain (or dyspnea).   nystatin powder Commonly known as: MYCOSTATIN/NYSTOP Apply topically 3 (three) times daily as needed (affected skin).   ondansetron 4 MG disintegrating tablet Commonly known as: ZOFRAN-ODT Take 1 tablet (4 mg total) by mouth every 6 (six) hours as needed for nausea.   oxybutynin 5 MG tablet Commonly known as: DITROPAN Take 0.5 tablets (2.5 mg total) by mouth 4 (four) times daily as needed for bladder spasms.   Trelegy Ellipta 100-62.5-25 MCG/INH Aepb Generic drug: Fluticasone-Umeclidin-Vilant Inhale 1 puff into the lungs daily.               Discharge Care Instructions  (From admission, onward)           Start  Ordered   10/21/20 0000  Discharge wound care:       Comments: As above.   10/21/20 1310           No Known Allergies  Follow-up Information     MD at [facility] Follow up.                   The results of significant diagnostics from this hospitalization (including imaging, microbiology, ancillary and laboratory) are listed below for reference.    Significant Diagnostic Studies: DG Chest 1 View  Result Date: 10/04/2020 CLINICAL DATA:  Left side chest pain EXAM: CHEST  1 VIEW COMPARISON:  09/29/2020 FINDINGS: Heart is normal size. Bibasilar atelectasis or infiltrates, slightly improved since prior study. Suspect small left effusion. Aortic atherosclerosis. No acute bony abnormality. IMPRESSION: Bilateral lower atelectasis or infiltrates, slightly improved since prior study. Small left effusion. Electronically Signed   By: Charlett Nose M.D.   On: 10/04/2020 19:45   DG Abd 1 View  Result Date: 09/30/2020 CLINICAL DATA:  Abdominal distension EXAM: ABDOMEN - 1 VIEW COMPARISON:  None. FINDINGS: Nonobstructive bowel gas pattern. Cholecystectomy clips. Mild degenerative changes of the lower lumbar spine. IMPRESSION: Negative. Electronically Signed   By:  Charline Bills M.D.   On: 09/30/2020 20:20   CT Angio Chest Pulmonary Embolism (PE) W or WO Contrast  Result Date: 09/24/2020 CLINICAL DATA:  Shortness of breath, body aches, cough, elevated D-dimer EXAM: CT ANGIOGRAPHY CHEST WITH CONTRAST TECHNIQUE: Multidetector CT imaging of the chest was performed using the standard protocol during bolus administration of intravenous contrast. Multiplanar CT image reconstructions and MIPs were obtained to evaluate the vascular anatomy. CONTRAST:  80mL OMNIPAQUE IOHEXOL 350 MG/ML SOLN COMPARISON:  CTA chest 2 days prior FINDINGS: Cardiovascular: Satisfactory opacification of the pulmonary arteries to the segmental level. There is no evidence of acute or chronic pulmonary embolism. The heart is enlarged. Coronary artery calcifications and calcified atherosclerotic plaque of the aortic arch are again seen. There is no pericardial effusion. The main pulmonary artery is significantly enlarged measuring up to 4.0 cm. There is reflux of contrast into the IVC and hepatic veins, also seen on the prior study. Mediastinum/Nodes: The imaged thyroid is unremarkable. A 1.0 cm precarinal lymph node and 1.0 cm right hilar lymph node are unchanged, nonspecific and possibly reactive. There is no other mediastinal or hilar lymphadenopathy. There is no axillary lymphadenopathy. Lungs/Pleura: The trachea and central airways are patent. Bilateral bronchial wall thickening is similar to the prior study. There is a background of mild-to-moderate centrilobular emphysema. There are consolidative opacities in both lung bases and peripheral opacity in the lingula, slightly worsened compared to the prior study. Some of the opacities again have a nodular appearance (for example in the superior segment of the left lower lobe measuring up to 8 mm, 4-34). There is no pleural effusion or pneumothorax. Calcified pleural plaque along the right base is unchanged, likely related to prior asbestos exposure.  Upper Abdomen: The imaged portions of the upper abdominal viscera are unremarkable. Musculoskeletal: Deformity of the right anterior fourth through sixth ribs is likely related to prior trauma. There is multilevel degenerative change of the thoracic spine with mild anterior wedge deformity of the T3 vertebral body, unchanged. There is no acute osseous abnormality. Review of the MIP images confirms the above findings. IMPRESSION: 1. No evidence of acute pulmonary embolism. 2. Patchy consolidative opacities in the lung bases and lingula have slightly worsened compared to the prior study and remain  concerning for multifocal pneumonia, with differential including COVID. As before, follow-up chest CT in 3 months is recommended to assess for resolution. 3. Unchanged cardiomegaly with evidence of right heart failure and pulmonary hypertension as above. 4. Coronary artery calcifications and Aortic Atherosclerosis (ICD10-I70.0). Electronically Signed   By: Lesia Hausen M.D.   On: 09/24/2020 14:44   CT Angio Chest PE W/Cm &/Or Wo Cm  Result Date: 09/22/2020 CLINICAL DATA:  Shortness of breath, cough, elevated D-dimer EXAM: CT ANGIOGRAPHY CHEST WITH CONTRAST TECHNIQUE: Multidetector CT imaging of the chest was performed using the standard protocol during bolus administration of intravenous contrast. Multiplanar CT image reconstructions and MIPs were obtained to evaluate the vascular anatomy. CONTRAST:  80mL OMNIPAQUE IOHEXOL 350 MG/ML SOLN COMPARISON:  Same day chest x-ray FINDINGS: Cardiovascular: Satisfactory opacification of the pulmonary arteries. Evaluation of the distal pulmonary arterial branches within the bilateral lung bases is degraded by respiratory motion artifact. No filling defect is visualized to the segmental branch levels. Main pulmonary trunk is dilated measuring 4.3 cm in diameter suggestive of underlying pulmonary arterial hypertension. Thoracic aorta is nonaneurysmal. Atherosclerotic calcifications of  the aorta and coronary arteries. Cardiomegaly. No pericardial effusion. Mediastinum/Nodes: Mildly prominent precarinal lymph node measures 11 mm short axis (series 5, image 84). Mildly prominent right hilar lymph node measuring approximately 11 mm short axis (series 5, image 110). Mildly prominent left hilar lymph node measuring 10 mm (series 5, image 112) no axillary lymphadenopathy. Thyroid and trachea within normal limits. Small hiatal hernia. No esophageal abnormality is evident. Lungs/Pleura: Dependent consolidations within the bilateral lower lobes and lingula. Additional patchy areas of consolidation predominantly within the dependent regions, some of which have a somewhat nodular configuration including 10 mm nodular opacity in the superior segment of the left lower lobe (series 10, image 51) and 8 mm nodular opacity within the posterior right middle lobe (series 10, image 88). Calcified pleural plaque at the posterior right lung base. No pleural effusion or pneumothorax. Diffuse bronchial wall thickening bilaterally. Upper Abdomen: Reflux of contrast into the IVC and hepatic veins. No acute findings are seen within the visualized upper abdomen. Musculoskeletal: Multilevel thoracic spondylosis with exaggerated thoracic kyphosis. No acute bony findings. No chest wall abnormality. Review of the MIP images confirms the above findings. IMPRESSION: 1. Negative for pulmonary embolism to the segmental branch levels. 2. Dependent consolidations within the bilateral lower lobes and lingula with additional patchy areas of opacity. Findings are favored to represent multifocal pneumonia. Distribution raises the suspicion for aspiration. 3. Some of the focal areas of airspace opacity have a somewhat nodular configuration. A repeat chest CT in 3 months is recommended to exclude an underlying pulmonary nodule following a trial of antibiotic therapy. 4. Mildly prominent mediastinal and bilateral hilar lymph nodes, likely  reactive. 5. Cardiomegaly with evidence of underlying pulmonary arterial hypertension. 6. Reflux of contrast into the IVC and hepatic veins suggesting right heart dysfunction. 7. Small hiatal hernia. Aortic Atherosclerosis (ICD10-I70.0). Electronically Signed   By: Duanne Guess D.O.   On: 09/22/2020 18:01   CT ABDOMEN PELVIS W CONTRAST  Result Date: 10/14/2020 CLINICAL DATA:  Bowel obstruction suspected. EXAM: CT ABDOMEN AND PELVIS WITH CONTRAST TECHNIQUE: Multidetector CT imaging of the abdomen and pelvis was performed using the standard protocol following bolus administration of intravenous contrast. CONTRAST:  80mL OMNIPAQUE IOHEXOL 350 MG/ML SOLN COMPARISON:  CT abdomen pelvis 10/08/2020, CT abdomen pelvis 09/30/2020 FINDINGS: Lower chest: Trace bilateral pleural effusions with bilateral lobe atelectasis. Right pleural base calcifications. Tiny hiatal  hernia. Hepatobiliary: The hepatic parenchyma is diffusely hypodense compared to the splenic parenchyma consistent with fatty infiltration. No focal liver abnormality. Status post cholecystectomy. No biliary dilatation. Pancreas: No focal lesion. Normal pancreatic contour. No surrounding inflammatory changes. No main pancreatic ductal dilatation. Spleen: Normal in size without focal abnormality. Adrenals/Urinary Tract: No adrenal nodule bilaterally. Bilateral kidneys enhance symmetrically. Subcentimeter hypodensities are too small to characterize. No hydronephrosis. No hydroureter. The urinary bladder is fully decompressed with Foley balloon and tip terminating within its lumen. On delayed imaging, there is no urothelial wall thickening and there are no filling defects in the opacified portions of the bilateral collecting systems or ureters. Stomach/Bowel: Enteric tube with tip and side port terminating within the gastric lumen. PO contrast is noted to reach the rectum. Stomach is within normal limits. Redemonstration of several loops of proximal and mid  small bowel dilatation with gas and fluid measuring up to 4.8 cm in caliber. There is a change in caliber of the small bowel within the mid abdomen in the setting of mesenteric swirling (5:46, 2:51) with associated distal small bowel that is fully collapsed but with its lumen filled with PO contrast. No pneumatosis. No evidence of large bowel wall thickening or dilatation. The rectum is under distended. Scattered sigmoid diverticulosis. The appendix not definitely identified. Vascular/Lymphatic: No abdominal aorta or iliac aneurysm. Severe atherosclerotic plaque of the aorta and its branches. No abdominal, pelvic, or inguinal lymphadenopathy. Reproductive: Status post hysterectomy. No adnexal masses. Other: Persistent trace free fluid along the pericolic gutters in the pelvis. No intraperitoneal free gas. No organized fluid collection. Musculoskeletal: No abdominal wall hernia or abnormality. No suspicious lytic or blastic osseous lesions. No acute displaced fracture. Multilevel degenerative changes of the spine. IMPRESSION: 1. Findings suggestive of a partial small bowel obstruction with associated mesenteric swirling within the mid abdomen. Differential diagnosis includes an ileus. Small bowel measures up to 4.8 cm in caliber with PO contrast reaching the rectum. 2. Nonspecific persistent trace free fluid. 3. Scattered sigmoid diverticulosis with no acute diverticulitis. 4. Hepatic steatosis. 5. Bilateral trace pleural effusions with passive atelectasis. Superimposed infection/inflammation not excluded. 6. Enteric tube in good position. 7.  Aortic Atherosclerosis (ICD10-I70.0). Electronically Signed   By: Tish Frederickson M.D.   On: 10/14/2020 16:27   CT ABDOMEN PELVIS W CONTRAST  Result Date: 10/08/2020 CLINICAL DATA:  Abdominal distention and discomfort. EXAM: CT ABDOMEN AND PELVIS WITH CONTRAST TECHNIQUE: Multidetector CT imaging of the abdomen and pelvis was performed using the standard protocol following  bolus administration of intravenous contrast. CONTRAST:  80mL OMNIPAQUE IOHEXOL 350 MG/ML SOLN COMPARISON:  09/30/2020 FINDINGS: Lower chest: Nodular and consolidative opacities are seen in the lung bases bilaterally. Hepatobiliary: No suspicious focal abnormality within the liver parenchyma. Gallbladder is surgically absent. No intrahepatic or extrahepatic biliary dilation. Pancreas: No focal mass lesion. No dilatation of the main duct. No intraparenchymal cyst. No peripancreatic edema. Spleen: No splenomegaly. No focal mass lesion. Adrenals/Urinary Tract: No adrenal nodule or mass. Tiny hypodensity upper pole left kidney is too small to characterize but likely benign. Right kidney unremarkable. No evidence for hydroureter. Bladder decompressed by Foley catheter. Stomach/Bowel: NG tube tip is in the proximal duodenum. Proximal small bowel loops are distended up to 3.1 cm diameter. Distal small bowel is less distended. Large stool volume noted in the distended right and transverse colon with gradual tapering of the colon through the descending and sigmoid segments. There is some minimal pericolonic edema around the ascending colon. Diverticular disease noted sigmoid  colon without diverticulitis. Vascular/Lymphatic: There is moderate atherosclerotic calcification of the abdominal aorta without aneurysm. There is no gastrohepatic or hepatoduodenal ligament lymphadenopathy. No retroperitoneal or mesenteric lymphadenopathy. Reproductive: No pelvic sidewall lymphadenopathy. Other: There is some trace fluid in the right paracolic gutter in right pelvis. Musculoskeletal: No worrisome lytic or sclerotic osseous abnormality. IMPRESSION: 1. Large stool volume in the distended right and transverse colon with gradual tapering of the colon through the descending and sigmoid segments. Imaging features are nonspecific although there is some pericolonic edema on the right with trace fluid in the right paracolic gutter. Component of  colitis not excluded. 2. Proximal small bowel loops are distended up to 3.1 cm diameter. Distal small bowel is less distended. No findings to suggest overt mechanical small-bowel obstruction. 3. Nodular and consolidative opacities in the lung bases bilaterally. Imaging features are compatible with multifocal pneumonia. 4. Trace fluid in the right paracolic gutter and right pelvis. 5. Aortic Atherosclerosis (ICD10-I70.0). Electronically Signed   By: Kennith Center M.D.   On: 10/08/2020 14:43   CT ABDOMEN PELVIS W CONTRAST  Result Date: 09/30/2020 CLINICAL DATA:  Abdominal pain, leukocytosis EXAM: CT ABDOMEN AND PELVIS WITH CONTRAST TECHNIQUE: Multidetector CT imaging of the abdomen and pelvis was performed using the standard protocol following bolus administration of intravenous contrast. CONTRAST:  80mL OMNIPAQUE IOHEXOL 350 MG/ML SOLN COMPARISON:  Right upper quadrant ultrasound dated 09/23/2020. FINDINGS: Lower chest: Patchy bilateral lower lobe opacities, atelectasis versus pneumonia. Hepatobiliary: Mildly macronodular hepatic contour. Status post cholecystectomy. No intrahepatic or extrahepatic ductal dilatation. Pancreas: Within normal limits. No peripancreatic fluid/inflammatory changes. Spleen: Within normal limits. Adrenals/Urinary Tract: Adrenal glands are within normal limits. 9 mm left upper pole renal calculus (series 2/image 22). Right kidney is within normal limits. No hydronephrosis. Bladder is decompressed by an indwelling Foley catheter with nondependent gas. Stomach/Bowel: Stomach is within normal limits. No evidence of bowel obstruction. Appendix is not discretely visualized. Sigmoid diverticulosis, without evidence of diverticulitis. Vascular/Lymphatic: No evidence of abdominal aortic aneurysm. Atherosclerotic calcifications of the abdominal aorta and branch vessels. No suspicious abdominopelvic lymphadenopathy. Reproductive: Status post hysterectomy. No adnexal masses. Other: No  abdominopelvic ascites. Musculoskeletal: Mild degenerative changes of the visualized thoracolumbar spine. IMPRESSION: Patchy bilateral lower lobe opacities, atelectasis versus pneumonia. Sigmoid diverticulosis, without evidence of diverticulitis. No peripancreatic inflammatory changes on CT. Status post cholecystectomy and hysterectomy. Appendix is not discretely visualized. Electronically Signed   By: Charline Bills M.D.   On: 09/30/2020 23:59   DG Chest Port 1 View  Result Date: 10/10/2020 CLINICAL DATA:  Respiratory failure. EXAM: PORTABLE CHEST 1 VIEW COMPARISON:  Radiograph 10/04/2020, lung bases from abdominal CT 10/08/2020 FINDINGS: Enteric tube tip below the diaphragm not included in the field of view. Stable heart size and mediastinal contours. Aortic atherosclerosis. Patchy bibasilar opacities without significant interval change. There is mild background bronchial thickening. Opacity abutting the left costophrenic angle represents a prominent epicardial fat pad on CT. Mild peribronchial thickening. No pneumothorax. Stable osseous structures. IMPRESSION: Unchanged patchy bibasilar opacities, atelectasis versus pneumonia. Electronically Signed   By: Narda Rutherford M.D.   On: 10/10/2020 00:37   DG CHEST PORT 1 VIEW  Result Date: 09/29/2020 CLINICAL DATA:  Shortness of breath EXAM: PORTABLE CHEST 1 VIEW COMPARISON:  09/29/2020 at 1706 hours FINDINGS: Mild patchy right lower lobe opacity. Pulmonary vascular congestion with mild interstitial edema. Small bilateral pleural effusions. Cardiomegaly. IMPRESSION: Cardiomegaly with mild interstitial edema and small bilateral pleural effusions. Superimposed mild patchy right lower lobe opacity, unchanged, atelectasis versus pneumonia.  Electronically Signed   By: Charline Bills M.D.   On: 09/29/2020 21:46   DG CHEST PORT 1 VIEW  Result Date: 09/29/2020 CLINICAL DATA:  Follow-up cardiomegaly with vascular congestion, pulmonary edema, and effusions  EXAM: PORTABLE CHEST 1 VIEW COMPARISON:  Chest radiograph 09/27/2020 FINDINGS: The heart size is stable. The mediastinal contours are unchanged, allowing for leftward patient rotation. A left pleural effusion with dense left basilar consolidation is similar to the prior study. Aeration of the right base has improved. Increased interstitial markings bilaterally are similar to the prior study consistent with pulmonary interstitial edema. There is no pneumothorax. IMPRESSION: 1. Improved aeration of the right base with decreased size of the right pleural effusion. 2. No significant interval change in size of the left pleural effusion with adjacent consolidation. 3. Overall unchanged pulmonary interstitial edema. Electronically Signed   By: Lesia Hausen M.D.   On: 09/29/2020 08:09   DG CHEST PORT 1 VIEW  Result Date: 09/27/2020 CLINICAL DATA:  COPD. EXAM: PORTABLE CHEST 1 VIEW COMPARISON:  September 26, 2020. FINDINGS: Stable cardiomegaly is noted. Central pulmonary vascular congestion is again noted. Stable bibasilar interstitial opacities are noted concerning for pulmonary edema with associated pleural effusions. Bony thorax is unremarkable. IMPRESSION: Stable cardiomegaly with central pulmonary vascular congestion and bibasilar pulmonary edema and associated effusions. Aortic Atherosclerosis (ICD10-I70.0). Electronically Signed   By: Lupita Raider M.D.   On: 09/27/2020 08:13   DG CHEST PORT 1 VIEW  Result Date: 09/26/2020 CLINICAL DATA:  Respiratory failure, history COPD EXAM: PORTABLE CHEST 1 VIEW COMPARISON:  Portable exam 0459 hours compared 09/25/2020 FINDINGS: Enlargement of cardiac silhouette with pulmonary vascular congestion. Atherosclerotic calcification aorta. BILATERAL pulmonary infiltrates greatest at bases. This could represent pulmonary edema or multifocal pneumonia. Probable RIGHT pleural effusion. No pneumothorax. Bones demineralized. IMPRESSION: Enlargement of cardiac silhouette with pulmonary  vascular congestion. Persistent BILATERAL pulmonary infiltrates greatest at bases question pulmonary edema versus multifocal infection. Probable RIGHT pleural effusion. Electronically Signed   By: Ulyses Southward M.D.   On: 09/26/2020 08:21   DG CHEST PORT 1 VIEW  Result Date: 09/25/2020 CLINICAL DATA:  Shortness of breath, body aches, decreased appetite, and cough. COVID diagnosis. EXAM: PORTABLE CHEST 1 VIEW COMPARISON:  Chest 09/22/2020.  CT chest 09/24/2020 FINDINGS: Cardiac enlargement. Prominent central hilar structures. Bilateral perihilar and basilar infiltrates could represent pneumonia or edema. Suggestion of developing density in the left costophrenic angle which may represent consolidation. Calcification of the aorta. No pneumothorax. IMPRESSION: Cardiac enlargement with prominent central hilar structures corresponding to vascular prominence on CT. Bilateral perihilar infiltrates. Suggestion of progressing consolidation in the left base. Electronically Signed   By: Burman Nieves M.D.   On: 09/25/2020 17:28   DG Chest Port 1 View  Result Date: 09/22/2020 CLINICAL DATA:  Shortness of breath and body aches. Hypoxia. Leukocytosis. EXAM: PORTABLE CHEST 1 VIEW COMPARISON:  None. FINDINGS: The cardiac silhouette is enlarged. The hila are also prominent bilaterally which may reflect enlarged pulmonary arteries or lymphadenopathy. Aortic atherosclerosis is noted. The interstitial markings are diffusely increased in both lungs with peribronchial thickening. No sizable pleural effusion or pneumothorax is identified. No acute osseous abnormality is seen. IMPRESSION: 1. Cardiomegaly with bilateral interstitial densities which may reflect infection (including atypical/viral etiologies) versus edema. 2. Bilateral hilar lymphadenopathy versus pulmonary arterial enlargement. Electronically Signed   By: Sebastian Ache M.D.   On: 09/22/2020 15:32   DG Abd 2 Views  Result Date: 10/16/2020 CLINICAL DATA:  Weakness,  abdominal pain EXAM: ABDOMEN -  2 VIEW COMPARISON:  10/14/2020 FINDINGS: Marked gaseous distension of small bowel loops again noted, stable since prior study. No real change. No organomegaly or free air. IMPRESSION: Continued marked gaseous distention of small bowel loops concerning for small bowel obstruction, unchanged. Electronically Signed   By: Charlett Nose M.D.   On: 10/16/2020 09:48   DG Abd Portable 1V  Result Date: 10/14/2020 CLINICAL DATA:  Ileus. EXAM: PORTABLE ABDOMEN - 1 VIEW COMPARISON:  October 13, 2020. FINDINGS: Stable small bowel dilatation is noted concerning for ileus or partial small bowel obstruction. Contrast is noted in the colon. Nasogastric tube tip is seen in stomach. IMPRESSION: Stable small bowel dilatation is noted concerning for ileus or partial small bowel obstruction. Electronically Signed   By: Lupita Raider M.D.   On: 10/14/2020 10:12   DG Abd Portable 1V  Result Date: 10/13/2020 CLINICAL DATA:  Small-bowel obstruction EXAM: PORTABLE ABDOMEN - 1 VIEW COMPARISON:  10/12/2020 FINDINGS: Multiple gas-filled dilated loops of small bowel are again seen within the mid abdomen in keeping with a mid to distal small bowel obstruction. Contrast from prior CT examination is again seen within several decompressed loops of distal small bowel as well as throughout the nondistended colon. Nasogastric tube tip is seen overlying the a gastric bubble within the left upper quadrant the abdomen. Left flank excluded from view. No gross free intraperitoneal gas. IMPRESSION: Persistent findings of high-grade partial or developing complete mid to distal small bowel obstruction. Electronically Signed   By: Helyn Numbers M.D.   On: 10/13/2020 11:37   DG Abd Portable 1V  Result Date: 10/12/2020 CLINICAL DATA:  Follow-up small-bowel obstruction. EXAM: PORTABLE ABDOMEN - 1 VIEW COMPARISON:  10/10/2020 FINDINGS: Increased caliber of multiple dilated loops of small bowel in the mid and upper  abdomen measuring up to 6.6 cm in diameter each. Persistent oral contrast in dilated small bowel loops in the pelvis. Those loops are significantly less dilated with some progression of contrast into the right colon. Unremarkable bones. IMPRESSION: Minimally improved high-grade partial small bowel obstruction. Electronically Signed   By: Beckie Salts M.D.   On: 10/12/2020 10:35   DG Abd Portable 1V  Result Date: 10/10/2020 CLINICAL DATA:  Constipation. EXAM: PORTABLE ABDOMEN - 1 VIEW COMPARISON:  10/09/2020 FINDINGS: An enteric tube remains in place terminating over the stomach. A large amount of stool remains in the right colon. Small bowel dilatation is unchanged. Oral contrast material remains in dilated small bowel loops without passage to the colon. IMPRESSION: Unchanged small bowel dilatation without progression of oral contrast material. Electronically Signed   By: Sebastian Ache M.D.   On: 10/10/2020 08:05   DG Abd Portable 1V  Result Date: 10/09/2020 CLINICAL DATA:  Ileus EXAM: PORTABLE ABDOMEN - 1 VIEW COMPARISON:  the previous day's study FINDINGS: Nasogastric tube into the nondilated stomach. There are multiple distended and dilated small bowel loops throughout the abdomen. The degree of abdominal dilatation appears slightly increased since previous exam. No convincing passage of oral contrast into the colon. There is a large amount fecal material proximal colon which appears decompressed distally. IMPRESSION: Small bowel dilatation without contrast progression into the stool-distended proximal colon suggesting fecal obstipation. Electronically Signed   By: Corlis Leak M.D.   On: 10/09/2020 07:45   DG Abd Portable 1V  Result Date: 10/08/2020 CLINICAL DATA:  Nasogastric tube placement. EXAM: PORTABLE ABDOMEN - 1 VIEW COMPARISON:  CT earlier today. FINDINGS: Tip and side port of the enteric tube below the  diaphragm in the stomach. Retained contrast within small bowel in the central abdomen, partially  included in the field of view. Mild colonic distension with air and stool, also partially included. No evidence of free air. IMPRESSION: Tip and side port of the enteric tube below the diaphragm in the stomach. Electronically Signed   By: Narda Rutherford M.D.   On: 10/08/2020 17:52   DG Abd Portable 1V  Result Date: 10/07/2020 CLINICAL DATA:  NG tube placement EXAM: PORTABLE ABDOMEN - 1 VIEW COMPARISON:  10/07/2020 FINDINGS: Patchy airspace disease at the bases. Esophageal tube tip overlies the distal stomach. Persistent gaseous dilatation large and small bowel. IMPRESSION: Esophageal tube tip overlies distal stomach. Persistent bowel distention Electronically Signed   By: Jasmine Pang M.D.   On: 10/07/2020 19:36   DG Abd Portable 1V  Result Date: 10/07/2020 CLINICAL DATA:  Worsening abdominal pain and distension this morning. EXAM: PORTABLE ABDOMEN - 1 VIEW COMPARISON:  09/30/2020; CT abdomen pelvis-09/30/2020 FINDINGS: Moderate to marked gaseous distension of multiple loops of distal small bowel as well as the cecum, ascending and transverse colon, progressed compared to the 09/30/2020 examination. Moderate colonic stool burden. Nondiagnostic evaluation for pneumoperitoneum secondary to supine positioning and exclusion of the lower thorax. No pneumatosis or portal venous gas. No definitive evidence of volvulus. Post cholecystectomy. No definitive abnormal intra-abdominal calcifications. Degenerative change of the lower lumbar spine is suspected though incompletely evaluated. IMPRESSION: 1. Worsening gaseous distension of the distal small bowel and colon nonspecific though presumably secondary to ileus. Continued attention on follow-up is advised. 2. Moderate colonic stool burden. Electronically Signed   By: Simonne Come M.D.   On: 10/07/2020 08:22   DG [employer] W/Fl W/Rad  Result Date: 10/07/2020 CLINICAL DATA:  Small bowel obstruction. EXAM: NASO G TUBE PLACEMENT WITH FL AND WITH RAD FLUOROSCOPY  TIME:  Fluoroscopy Time: < 1 second Radiation Exposure Index (if provided by the fluoroscopic device): 0.4 mGy Number of Acquired Spot Images: None COMPARISON:  Abdominal radiograph 10/07/2020. CT abdomen/pelvis 09/30/2020. FINDINGS: Multiple attempts at nasogastric tube placement via both the right and left nares were unsuccessful. The NG tube could not be advanced beyond the nasal cavities/nasopharynx. IMPRESSION: Unsuccessful fluoroscopically-guided NG tube placement, as described. Electronically Signed   By: Jackey Loge D.O.   On: 10/07/2020 16:36   ECHOCARDIOGRAM COMPLETE  Result Date: 09/23/2020    ECHOCARDIOGRAM REPORT   Patient Name:   DANNON DEMSKY Date of Exam: 09/23/2020 Medical Rec #:  [id number]     Height:       63.0 in Accession #:    [id number]    Weight:       200.4 lb Date of Birth:  11-18-1940    BSA:          1.935 m Patient Age:    79 years      BP:           122/76 mmHg Patient Gender: F             HR:           76 bpm. Exam Location:  Inpatient Procedure: 2D Echo, 3D Echo, Cardiac Doppler and Color Doppler Indications:    I51.7 Cardiomegaly  History:        Patient has no prior history of Echocardiogram examinations.                 Cardiomegaly, COPD; Signs/Symptoms:Shortness of Breath and  Dyspnea.  Sonographer:    Sheralyn Boatman RDCS Referring Phys: [id number] ANASTASSIA DOUTOVA  Sonographer Comments: Technically difficult study due to poor echo windows. Image acquisition challenging due to patient body habitus. IMPRESSIONS  1. Left ventricular ejection fraction, by estimation, is 55 to 60%. Left ventricular ejection fraction by 3D volume is 59 %. The left ventricle has normal function. The left ventricle has no regional wall motion abnormalities. There is moderate left ventricular hypertrophy. Left ventricular diastolic parameters are consistent with Grade I diastolic dysfunction (impaired relaxation).  2. Right ventricular systolic function mildly reduced at base and mid, with  relative preserved apical systolic function. The right ventricular size is moderately enlarged. There is moderately elevated pulmonary artery systolic pressure. The estimated right ventricular systolic pressure is 48.4 mmHg.  3. Left atrial size was mildly dilated.  4. Right atrial size was moderately dilated.  5. The mitral valve is normal in structure. Trivial mitral valve regurgitation. No evidence of mitral stenosis.  6. Tricuspid valve regurgitation is mild to moderate.  7. The aortic valve is normal in structure. Aortic valve regurgitation is not visualized. No aortic stenosis is present.  8. The inferior vena cava is dilated in size with <50% respiratory variability, suggesting right atrial pressure of 15 mmHg. FINDINGS  Left Ventricle: Left ventricular ejection fraction, by estimation, is 55 to 60%. Left ventricular ejection fraction by 3D volume is 59 %. The left ventricle has normal function. The left ventricle has no regional wall motion abnormalities. The left ventricular internal cavity size was normal in size. There is moderate left ventricular hypertrophy. Left ventricular diastolic parameters are consistent with Grade I diastolic dysfunction (impaired relaxation). Right Ventricle: Calcified moderate band. The right ventricular size is moderately enlarged. No increase in right ventricular wall thickness. Right ventricular systolic function mildly reduced at base and mid, with relative preserved apical systolic function. There is moderately elevated pulmonary artery systolic pressure. The tricuspid regurgitant velocity is 2.89 m/s, and with an assumed right atrial pressure of 15 mmHg, the estimated right ventricular systolic pressure is 48.4 mmHg. Left Atrium: Left atrial size was mildly dilated. Right Atrium: Right atrial size was moderately dilated. Pericardium: There is no evidence of pericardial effusion. Presence of pericardial fat pad. Mitral Valve: The mitral valve is normal in structure. Mild  mitral annular calcification. Trivial mitral valve regurgitation. No evidence of mitral valve stenosis. Tricuspid Valve: The tricuspid valve is normal in structure. Tricuspid valve regurgitation is mild to moderate. No evidence of tricuspid stenosis. Aortic Valve: The aortic valve is normal in structure. Aortic valve regurgitation is not visualized. No aortic stenosis is present. Pulmonic Valve: The pulmonic valve was normal in structure. Pulmonic valve regurgitation is trivial. No evidence of pulmonic stenosis. Aorta: Aorta dimension upper limit of normal for age and BSA. The aortic root is normal in size and structure. Venous: The inferior vena cava is dilated in size with less than 50% respiratory variability, suggesting right atrial pressure of 15 mmHg. IAS/Shunts: The interatrial septum appears to be lipomatous. No atrial level shunt detected by color flow Doppler.  LEFT VENTRICLE PLAX 2D LVIDd:         3.60 cm         Diastology LVIDs:         2.50 cm         LV e' medial:    6.31 cm/s LV PW:         1.30 cm         LV E/e'  medial:  9.0 LV IVS:        1.30 cm         LV e' lateral:   10.20 cm/s LVOT diam:     2.00 cm         LV E/e' lateral: 5.6 LV SV:         54 LV SV Index:   28 LVOT Area:     3.14 cm        3D Volume EF                                LV 3D EF:    Left                                             ventricul LV Volumes (MOD)                            ar LV vol d, MOD    73.9 ml                    ejection A2C:                                        fraction LV vol d, MOD    53.3 ml                    by 3D A4C:                                        volume is LV vol s, MOD    28.2 ml                    59 %. A2C: LV vol s, MOD    20.1 ml A4C:                           3D Volume EF: LV SV MOD A2C:   45.7 ml       3D EF:        59 % LV SV MOD A4C:   53.3 ml LV SV MOD BP:    38.1 ml RIGHT VENTRICLE             IVC RV S prime:     10.60 cm/s  IVC diam: 2.90 cm TAPSE (M-mode): 1.8 cm LEFT ATRIUM              Index       RIGHT ATRIUM           Index LA diam:        3.90 cm 2.02 cm/m  RA Area:     25.80 cm LA Vol (A2C):   33.2 ml 17.16 ml/m RA Volume:   96.90 ml  50.07 ml/m LA Vol (A4C):   54.8 ml 28.32 ml/m LA Biplane Vol: 43.4 ml 22.43 ml/m  AORTIC VALVE             PULMONIC VALVE LVOT Vmax:   95.70 cm/s  PR End Diast Vel: 1.35 msec LVOT Vmean:  61.300 cm/s LVOT VTI:    0.172 m  AORTA Ao Root diam: 3.70 cm Ao Asc diam:  3.80 cm MITRAL VALVE               TRICUSPID VALVE MV Area (PHT): 2.95 cm    TV Peak grad:   34.6 mmHg MV Decel Time: 257 msec    TV Vmax:        2.94 m/s MV E velocity: 56.80 cm/s  TR Peak grad:   33.4 mmHg MV A velocity: 82.10 cm/s  TR Vmax:        289.00 cm/s MV E/A ratio:  0.69                            SHUNTS                            Systemic VTI:  0.17 m                            Systemic Diam: 2.00 cm Weston Brass MD Electronically signed by Weston Brass MD Signature Date/Time: 09/23/2020/4:25:50 PM    Final    ECHOCARDIOGRAM LIMITED BUBBLE STUDY  Result Date: 10/01/2020    ECHOCARDIOGRAM LIMITED REPORT   Patient Name:   DOLORES EWING Date of Exam: 10/01/2020 Medical Rec #:  [id number]     Height:       63.0 in Accession #:    [id number]    Weight:       200.4 lb Date of Birth:  August 30, 1940    BSA:          1.935 m Patient Age:    79 years      BP:           116/83 mmHg Patient Gender: F             HR:           100 bpm. Exam Location:  Inpatient Procedure: Limited Echo, Color Doppler, Cardiac Doppler and Saline Contrast            Bubble Study Indications:    I48.91* Unspecified atrial fibrillation  History:        Patient has prior history of Echocardiogram examinations, most                 recent 09/23/2020. COPD; Arrythmias:Atrial Fibrillation.  Sonographer:    Andee Lineman Referring Phys: [id number] Kateri Mc LATIF [facility] IMPRESSIONS  1. Left ventricular ejection fraction, by estimation, is 60 to 65%. The left ventricle has normal function. There is moderate concentric  left ventricular hypertrophy. Left ventricular diastolic function could not be evaluated.  2. The mitral valve is grossly normal. Trivial mitral valve regurgitation. No evidence of mitral stenosis.  3. The aortic valve is tricuspid. There is mild calcification of the aortic valve. Aortic valve regurgitation is trivial. No aortic stenosis is present.  4. Agitated saline contrast bubble study was negative, with no evidence of any interatrial shunt. Conclusion(s)/Recommendation(s): Limited study, no major change from recent study 09/23/20. Bubble study negative for intraatrial shunt. FINDINGS  Left Ventricle: Left ventricular ejection fraction, by estimation, is 60 to 65%. The left ventricle has normal function. The left ventricular internal cavity size was normal in size. There is moderate concentric left ventricular hypertrophy. Left ventricular diastolic  function could not be evaluated. Left ventricular diastolic function could not be evaluated due to atrial fibrillation. Pericardium: Presence of pericardial fat pad. Mitral Valve: The mitral valve is grossly normal. Trivial mitral valve regurgitation. No evidence of mitral valve stenosis. Tricuspid Valve: The tricuspid valve is grossly normal. Tricuspid valve regurgitation is trivial. No evidence of tricuspid stenosis. Aortic Valve: The aortic valve is tricuspid. There is mild calcification of the aortic valve. Aortic valve regurgitation is trivial. No aortic stenosis is present. Aortic valve mean gradient measures 5.0 mmHg. Aortic valve peak gradient measures 6.9 mmHg. Aortic valve area, by VTI measures 2.39 cm. Pulmonic Valve: The pulmonic valve was grossly normal. Pulmonic valve regurgitation is trivial. No evidence of pulmonic stenosis. IAS/Shunts: No atrial level shunt detected by color flow Doppler. Agitated saline contrast was given intravenously to evaluate for intracardiac shunting. Agitated saline contrast bubble study was negative, with no evidence of any  interatrial shunt. LEFT VENTRICLE PLAX 2D LVIDd:         4.00 cm     Diastology LVIDs:         2.60 cm     LV e' medial:  5.87 cm/s LV PW:         1.50 cm     LV e' lateral: 15.80 cm/s LV IVS:        1.70 cm LVOT diam:     1.90 cm LV SV:         50 LV SV Index:   26 LVOT Area:     2.84 cm  LV Volumes (MOD) LV vol d, MOD A4C: 34.9 ml LV vol s, MOD A4C: 13.7 ml LV SV MOD A4C:     34.9 ml RIGHT VENTRICLE RV S prime:     8.27 cm/s TAPSE (M-mode): 1.5 cm LEFT ATRIUM             Index       RIGHT ATRIUM           Index LA diam:        2.40 cm 1.24 cm/m  RA Area:     15.10 cm LA Vol (A2C):   64.6 ml 33.38 ml/m RA Volume:   44.40 ml  22.94 ml/m LA Vol (A4C):   42.2 ml 21.81 ml/m LA Biplane Vol: 56.2 ml 29.04 ml/m  AORTIC VALVE AV Area (Vmax):    2.19 cm AV Area (Vmean):   1.79 cm AV Area (VTI):     2.39 cm AV Vmax:           131.00 cm/s AV Vmean:          102.000 cm/s AV VTI:            0.211 m AV Peak Grad:      6.9 mmHg AV Mean Grad:      5.0 mmHg LVOT Vmax:         101.00 cm/s LVOT Vmean:        64.300 cm/s LVOT VTI:          0.178 m LVOT/AV VTI ratio: 0.84  AORTA Ao Root diam: 3.20 cm Ao Asc diam:  3.90 cm MITRAL VALVE MV Area (PHT): 2.93 cm SHUNTS                         Systemic VTI:  0.18 m  Systemic Diam: 1.90 cm Jodelle Red MD Electronically signed by Jodelle Red MD Signature Date/Time: 10/01/2020/5:24:05 PM    Final    VAS [place] LOWER EXTREMITY VENOUS (DVT)  Result Date: 09/24/2020  Lower Venous DVT Study Patient Name:  ZANIYA MCAULAY  Date of Exam:   09/24/2020 Medical Rec #: [id number]      Accession #:    [id number] Date of Birth: [birth year]     Patient Gender: F Patient Age:   22 years Exam Location:  [facility] Procedure:      VAS [place] LOWER EXTREMITY VENOUS (DVT) Referring Phys: MURALI RAMASWAMY --------------------------------------------------------------------------------  Indications: Edema.  Risk Factors: COVID 19 positive. Limitations: Poor  ultrasound/tissue interface. Comparison Study: No prior studies. Performing Technologist: Chanda Busing RVT  Examination Guidelines: A complete evaluation includes B-mode imaging, spectral Doppler, color Doppler, and power Doppler as needed of all accessible portions of each vessel. Bilateral testing is considered an integral part of a complete examination. Limited examinations for reoccurring indications may be performed as noted. The reflux portion of the exam is performed with the patient in reverse Trendelenburg.  +---------+---------------+---------+-----------+----------+--------------+ RIGHT    CompressibilityPhasicitySpontaneityPropertiesThrombus Aging +---------+---------------+---------+-----------+----------+--------------+ CFV      Full           Yes      Yes                                 +---------+---------------+---------+-----------+----------+--------------+ SFJ      Full                                                        +---------+---------------+---------+-----------+----------+--------------+ FV Prox  Full                                                        +---------+---------------+---------+-----------+----------+--------------+ FV Mid   Full                                                        +---------+---------------+---------+-----------+----------+--------------+ FV DistalFull                                                        +---------+---------------+---------+-----------+----------+--------------+ PFV      Full                                                        +---------+---------------+---------+-----------+----------+--------------+ POP      Full           Yes      Yes                                 +---------+---------------+---------+-----------+----------+--------------+  PTV      Full                                                         +---------+---------------+---------+-----------+----------+--------------+ PERO     Full                                                        +---------+---------------+---------+-----------+----------+--------------+   +---------+---------------+---------+-----------+----------+--------------+ LEFT     CompressibilityPhasicitySpontaneityPropertiesThrombus Aging +---------+---------------+---------+-----------+----------+--------------+ CFV      Full           Yes      Yes                                 +---------+---------------+---------+-----------+----------+--------------+ SFJ      Full                                                        +---------+---------------+---------+-----------+----------+--------------+ FV Prox  Full                                                        +---------+---------------+---------+-----------+----------+--------------+ FV Mid   Full                                                        +---------+---------------+---------+-----------+----------+--------------+ FV DistalFull                                                        +---------+---------------+---------+-----------+----------+--------------+ PFV      Full                                                        +---------+---------------+---------+-----------+----------+--------------+ POP      Full           Yes      Yes                                 +---------+---------------+---------+-----------+----------+--------------+ PTV      Full                                                        +---------+---------------+---------+-----------+----------+--------------+  PERO     Full                                                        +---------+---------------+---------+-----------+----------+--------------+     Summary: RIGHT: - There is no evidence of deep vein thrombosis in the lower extremity.  - No cystic structure found in  the popliteal fossa.  LEFT: - There is no evidence of deep vein thrombosis in the lower extremity.  - No cystic structure found in the popliteal fossa.  *See table(s) above for measurements and observations. Electronically signed by Coral Else MD on 09/24/2020 at 8:07:15 PM.    Final    [place] EKG SITE RITE  Result Date: 10/16/2020 If Site Rite image not attached, placement could not be confirmed due to current cardiac rhythm.  US Abdomen Limited RUQ (LIVER/GB)  Result Date: 09/23/2020 CLINICAL DATA:  80 year old female with abnormal LFTs. EXAM: ULTRASOUND ABDOMEN LIMITED RIGHT UPPER QUADRANT COMPARISON:  CTA chest 09/22/2020. FINDINGS: Gallbladder: Absent as seen on the CTA yesterday. Common bile duct: Diameter: 3 mm, normal. Liver: Echogenic liver (images 14, 18). No discrete liver lesion. Portal vein is patent on color Doppler imaging with normal direction of blood flow towards the liver. Mildly nodular liver contour on some images such (images 29 and 35). Other: Visible right kidney is within normal limits.  No free fluid. IMPRESSION: 1. Hepatic steatosis, with mildly nodular liver contour raising the possibility of cirrhosis. 2. Absent gallbladder with no evidence of bile duct obstruction. Electronically Signed   By: Odessa Fleming M.D.   On: 09/23/2020 06:45    Microbiology: No results found for this or any previous visit (from the past 240 hour(s)).   Labs: Basic Metabolic Panel: Recent Labs  Lab 10/15/20 0314 10/16/20 0408 10/17/20 0252 10/18/20 0849  NA 131* 130* 135 133*  K 3.2* 4.0 3.6 3.3*  CL 97* 100 106 107  CO2 24 24 21* 21*  GLUCOSE 134* 112* 97 90  BUN CREATININE 0.37* 0.44 0.50 0.44  CALCIUM 7.9* 7.7* 7.9* 7.8*  MG 2.0 2.0 1.9 1.8   Liver Function Tests: Recent Labs  Lab 10/15/20 0314 10/16/20 0408 10/17/20 0252 10/18/20 0849  AST 44* 57* 33 23  ALT 76* 90* 70* 56*  ALKPHOS 51 63 55 56  BILITOT 1.1 1.2 1.1 1.0  PROT 5.3* 5.3* 5.4* 5.0*  ALBUMIN 2.4*  2.4* 2.6* 2.4*   No results for input(s): LIPASE, AMYLASE in the last 168 hours. No results for input(s): AMMONIA in the last 168 hours. CBC: Recent Labs  Lab 10/15/20 0314 10/16/20 0408 10/17/20 0252 10/18/20 0849  WBC 6.0 6.4 5.4 5.4  NEUTROABS  --   --  4.1 4.0  HGB 12.5 13.1 12.5 12.5  HCT 36.3 38.9 36.9 37.2  MCV 91.7 91.7 92.9 92.3  PLT 137* 117* 97* 97*   Cardiac Enzymes: No results for input(s): CKTOTAL, CKMB, CKMBINDEX, TROPONINI in the last 168 hours. BNP: BNP (last 3 results) Recent Labs    09/22/20 2148 09/26/20 0303 10/03/20 0255  BNP 889.7* 964.7* 430.5*    ProBNP (last 3 results) No results for input(s): PROBNP in the last 8760 hours.  CBG: Recent Labs  Lab 10/17/20 1355 10/17/20 1620 10/17/20 2155 10/18/20 0803 10/18/20 1154  GLUCAP 80 88 86 89 93  Signed:  Ramiro Harvest MD.  [facility] 10/21/2020, 1:22 PM

## 2020-11-01 DEATH — deceased

## 2020-11-11 ENCOUNTER — Ambulatory Visit (HOSPITAL_COMMUNITY): Payer: Medicare HMO | Attending: Cardiovascular Disease

## 2020-11-11 ENCOUNTER — Encounter (HOSPITAL_COMMUNITY): Payer: Self-pay | Admitting: Cardiology

## 2020-11-17 NOTE — Progress Notes (Deleted)
Cardiology Office Note:    Date:  11/17/2020   ID:  Herschell Dimes, DOB 1940-05-15, MRN [id number]  PCP:  System, Provider Not In   [facility] Providers Cardiologist:  Armanda Magic, MD { Click to update primary MD,subspecialty MD or APP then REFRESH:1}  *** Referring MD: No ref. provider found   Chief Complaint:  No chief complaint on file. {Click here for Visit Info    :1}   Patient Profile:   Kimberly Ballard is a 80 y.o. 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 Left ventricular ejection fraction, by estimation, is 60 to 65%. The  left ventricle has normal function. There is moderate concentric left  ventricular hypertrophy. Left ventricular diastolic function could not be  evaluated.   2. The mitral valve is grossly normal. Trivial mitral valve  regurgitation. No evidence of mitral stenosis.   3. The aortic valve is tricuspid. There is mild calcification of the  aortic valve. Aortic valve regurgitation is trivial. No aortic stenosis is  present.   4. Agitated saline contrast bubble study was negative, with no evidence  of any interatrial shunt.   Chest CTA 09/24/20 IMPRESSION: 1. No evidence of acute pulmonary embolism. 2. Patchy consolidative opacities in the lung bases and lingula have slightly worsened compared to the prior study and remain concerning for multifocal pneumonia, with differential including COVID. As before, follow-up chest CT in 3 months is recommended to assess for resolution. 3. Unchanged cardiomegaly with evidence of right heart failure and pulmonary hypertension as above. 4. Coronary artery  calcifications and Aortic Atherosclerosis (ICD10-I70.0).  Echocardiogram 09/23/20  1. Left ventricular ejection fraction, by estimation, is 55 to 60%. Left  ventricular ejection fraction by 3D volume is 59 %. The left ventricle has  normal function. The left ventricle has no regional wall motion  abnormalities. There is moderate left  ventricular hypertrophy. Left ventricular diastolic parameters are  consistent with Grade I diastolic dysfunction (impaired relaxation).   2. Right ventricular systolic function mildly reduced at base and mid,  with relative preserved apical systolic function. The right ventricular  size is moderately enlarged. There is moderately elevated pulmonary artery  systolic pressure. The estimated  right ventricular systolic pressure is 48.4 mmHg.   3. Left atrial size was mildly dilated.   4. Right atrial size was moderately dilated.   5. The mitral valve is normal in structure. Trivial mitral valve  regurgitation. No evidence of mitral stenosis.   6. Tricuspid valve regurgitation is mild to moderate.   7. The aortic valve is normal in structure. Aortic valve regurgitation is  not visualized. No aortic stenosis is present.   8. The inferior vena cava is dilated in size with <50% respiratory  variability, suggesting right atrial pressure of 15 mmHg.   {Select studies to display:26339}   History of Present Illness: Kimberly Ballard was admitted 8/22-9/20 with acute hypoxemic respiratory failure in the setting of multifocal pneumonia.  She was managed with IV antibiotics and followed by PCCM.  The patient was seen by Cardiology for elevated hs-Trop felt to be due to demand ischemia.  Echocardiogram showed normal EF, RV dysfunction and mod pulmonary hypertension with  RVSP 48.4 mmHg.   She also developed AF with RVR and was on IV Amiodarone which was transitioned to oral Amiodarone.   Her hospital course was also complicated by hypotension/shock, ileus vs small bowel  obstruction, decompensated (HFpEF) heart failure with preserved ejection fraction and cor pulmonale, hematuria, AKI on chronic kidney disease, pressure sore, coffee ground emesis/hematemesis.  Anticoagulation was DC'd due to recurrent hematemesis.  The patient had poor progression and was felt to have a poor prognosis.  She was seen by palliative medicine and was DC to residential Hospice at [facility].      Past Medical History:  Diagnosis Date   COPD (chronic obstructive pulmonary disease) (HCC)    Current Medications: No outpatient medications have been marked as taking for the 11/18/20 encounter (Appointment) with Tereso Newcomer T, PA-C.    Allergies:   Patient has no known allergies.   Social History   Tobacco Use   Smoking status: Former    Types: Cigarettes   Smokeless tobacco: Never  Vaping Use   Vaping Use: Never used  Substance Use Topics   Alcohol use: Yes    Alcohol/week: 2.0 standard drinks    Types: 2 Glasses of wine per week   Drug use: Never    Family Hx: The patient's family history is negative for Diabetes and CAD.  ROS   EKGs/Labs/Other Test Reviewed:    EKG:  EKG is *** ordered today.  The ekg ordered today demonstrates ***  Recent Labs: 09/23/2020: TSH 1.988 10/03/2020: B Natriuretic Peptide 430.5 10/18/2020: ALT 56; BUN 13; Creatinine, Ser 0.44; Hemoglobin 12.5; Magnesium 1.8; Platelets 97; Potassium 3.3; Sodium 133   Recent Lipid Panel Lab Results  Component Value Date/Time   CHOL 104 09/23/2020 02:07 AM   TRIG 80 09/23/2020 02:07 AM   HDL 25 (L) 09/23/2020 02:07 AM   LDLCALC 63 09/23/2020 02:07 AM     Risk Assessment/Calculations:   {Does this patient have ATRIAL FIBRILLATION?:[id number]}      Physical Exam:    VS:  There were no vitals taken for this visit.    Wt Readings from Last 3 Encounters:  10/18/20 208 lb 5.4 oz (94.5 kg)    Physical Exam ***     ASSESSMENT & PLAN:   {Select for Dx:25819}    {Are you ordering a CV  Procedure (e.g. stress test, cath, DCCV, TEE, etc)?   Press F2        :[id number]}   Dispo:  No follow-ups on file.   Medication Adjustments/Labs and Tests Ordered: Current medicines are reviewed at length with the patient today.  Concerns regarding medicines are outlined above.  Tests Ordered: No orders of the defined types were placed in this encounter.  Medication Changes: No orders of the defined types were placed in this encounter.  Signed, Tereso Newcomer, PA-C  11/17/2020 8:57 PM    [facility] [address] [place], [place], [place]  [address] Phone: [phone]; Fax: [phone]

## 2020-11-18 ENCOUNTER — Ambulatory Visit: Payer: Medicare HMO | Admitting: Physician Assistant

## 2020-11-18 DIAGNOSIS — I5032 Chronic diastolic (congestive) heart failure: Secondary | ICD-10-CM

## 2020-11-18 DIAGNOSIS — J449 Chronic obstructive pulmonary disease, unspecified: Secondary | ICD-10-CM

## 2020-11-18 DIAGNOSIS — I2729 Other secondary pulmonary hypertension: Secondary | ICD-10-CM

## 2020-11-18 DIAGNOSIS — I4819 Other persistent atrial fibrillation: Secondary | ICD-10-CM

## 2020-11-18 DIAGNOSIS — I2584 Coronary atherosclerosis due to calcified coronary lesion: Secondary | ICD-10-CM

## 2020-11-18 DIAGNOSIS — I2781 Cor pulmonale (chronic): Secondary | ICD-10-CM

## 2020-11-18 DIAGNOSIS — I700 Atherosclerosis of aorta: Secondary | ICD-10-CM

## 2020-11-20 ENCOUNTER — Telehealth (HOSPITAL_COMMUNITY): Payer: Self-pay | Admitting: Cardiology

## 2020-11-20 NOTE — Telephone Encounter (Signed)
Just an FYI. We have made several attempts to contact this patient including sending a letter to schedule or reschedule their echocardiogram. We will be removing the patient from the echo WQ.  11/11/20 NO SHOWED-MAILED LETTER [facility]    Thank you

## 2020-12-24 ENCOUNTER — Encounter (HOSPITAL_COMMUNITY): Payer: Self-pay | Admitting: Radiology

## 2023-02-11 IMAGING — CT CT ANGIO CHEST
2 of 6 series · 17 of 36 positions shown · IV contrast (omnipaque)
Comparison: Same day chest x-ray

CLINICAL DATA: Shortness of breath, cough, elevated D-dimer

EXAM:
CT ANGIOGRAPHY CHEST WITH CONTRAST
TECHNIQUE: Multidetector CT imaging of the chest was performed using the
standard protocol during bolus administration of intravenous
contrast. Multiplanar CT image reconstructions and MIPs were
obtained to evaluate the vascular anatomy.
CONTRAST:  80mL OMNIPAQUE IOHEXOL 350 MG/ML SOLN

[Series 5: thins · axial · 0.76mm/px · z∈[-277,-37]mm · 16 of 270 slices shown]
[im 15/270  lung]
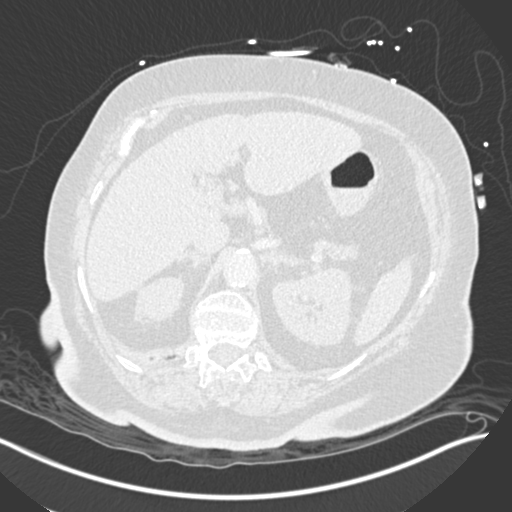
[im 30/270  mediastinal]
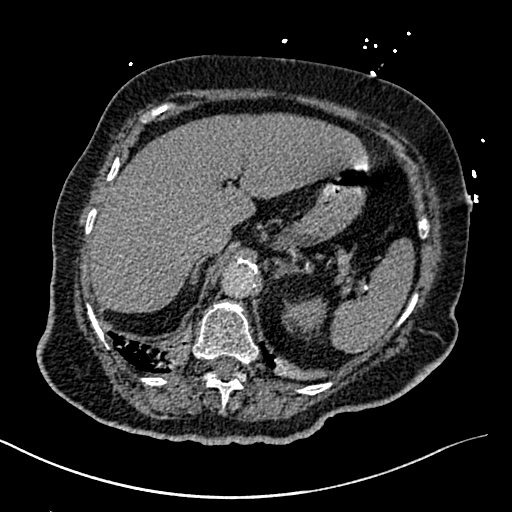
[im 45/270  lung]
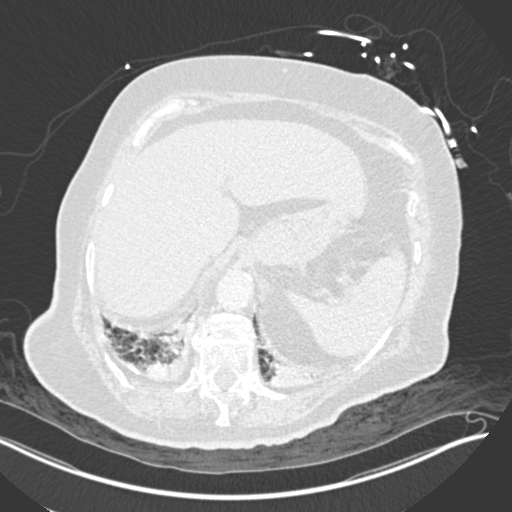
[im 60/270  mediastinal]
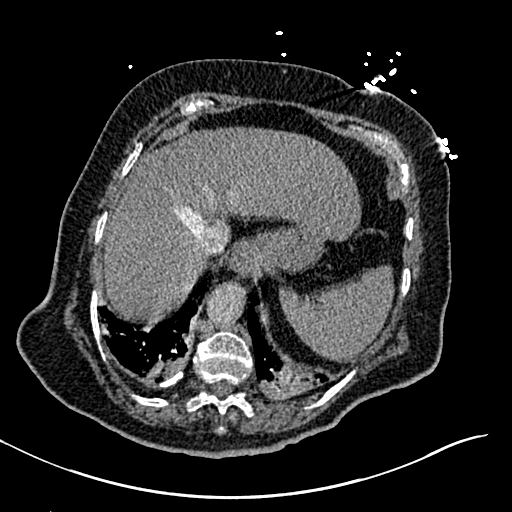
[im 75/270  lung]
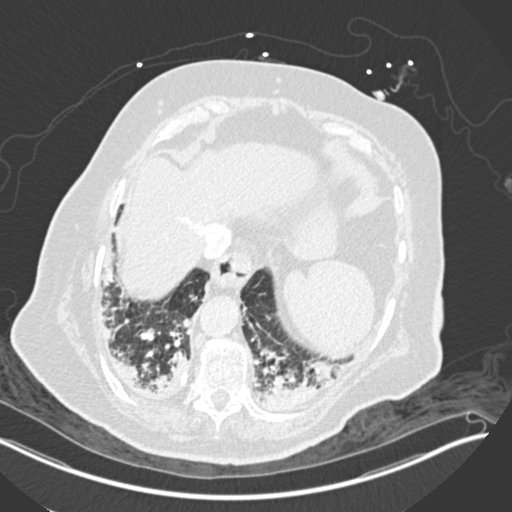
[im 90/270  mediastinal]
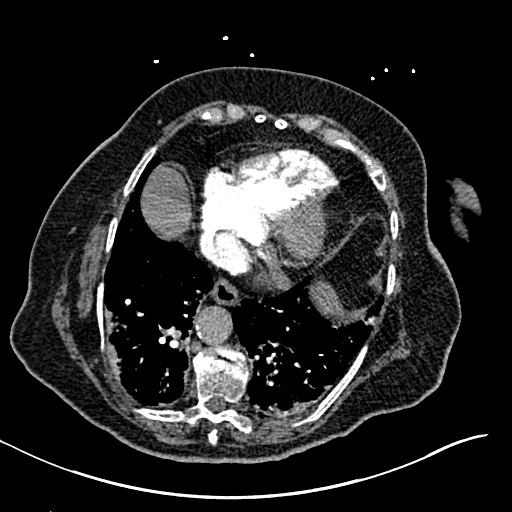
[im 105/270  lung]
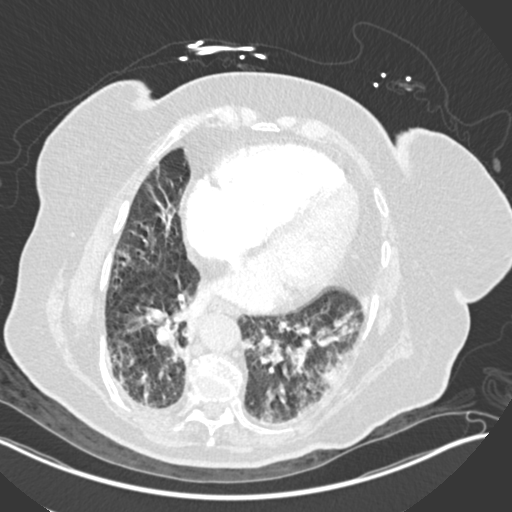
[im 120/270  mediastinal]
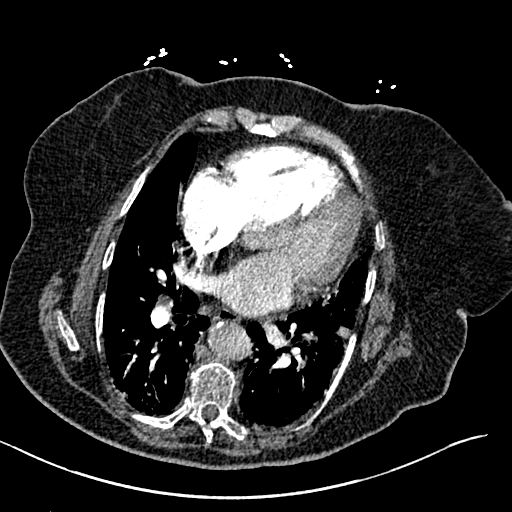
[im 150/270  lung]
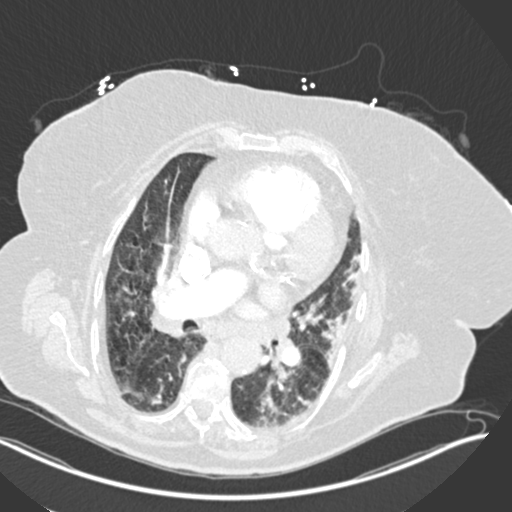
[im 165/270  mediastinal]
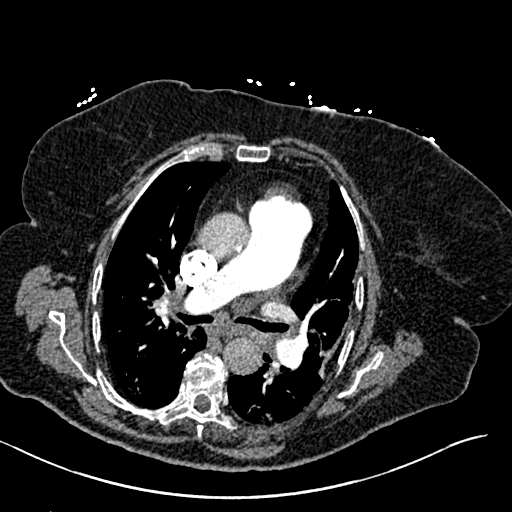
[im 180/270  lung]
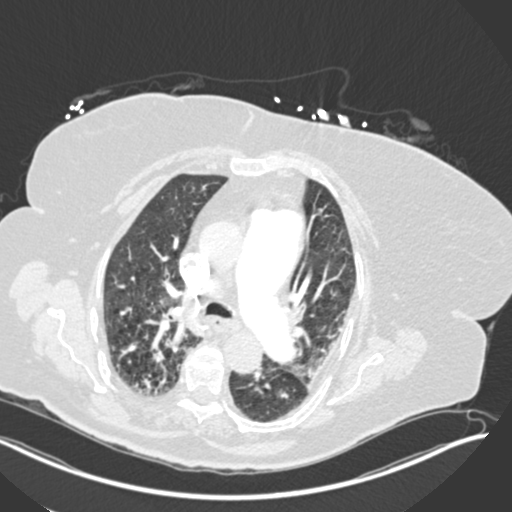
[im 195/270  mediastinal]
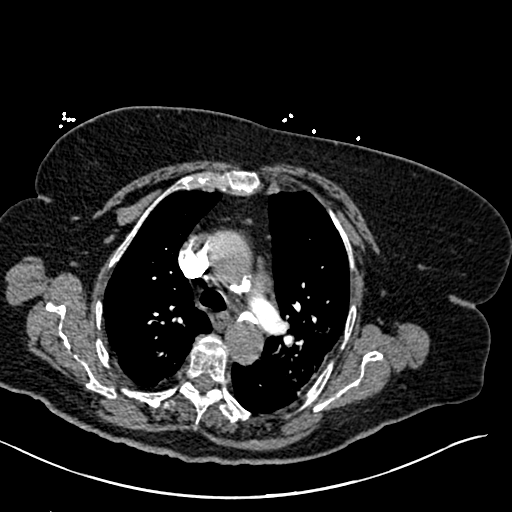
[im 210/270  lung]
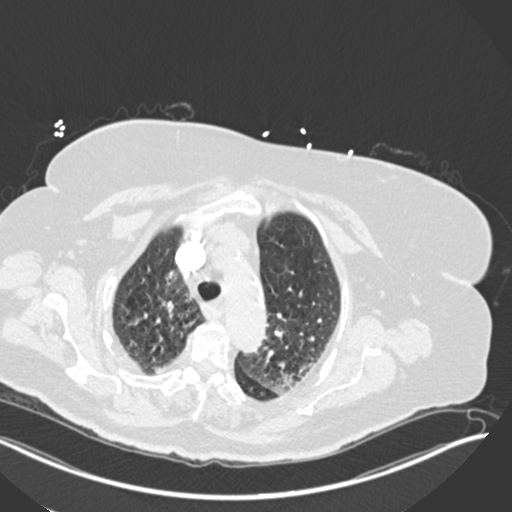
[im 225/270  mediastinal]
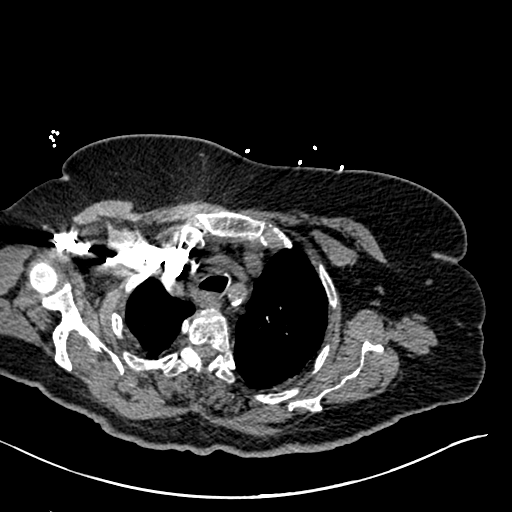
[im 240/270  lung]
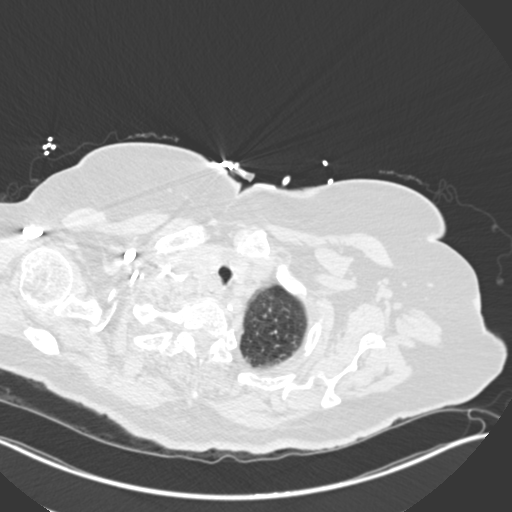
[im 255/270  mediastinal]
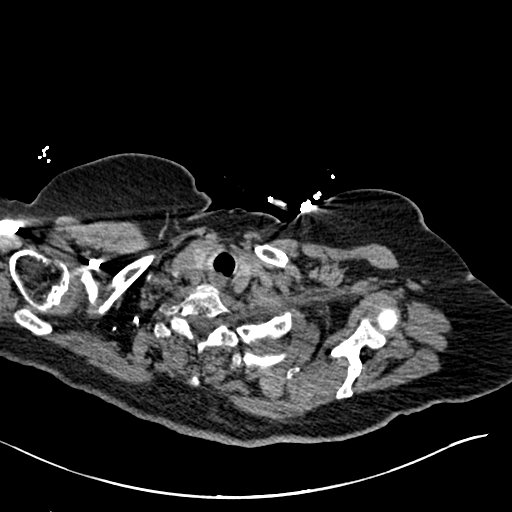

[Series 6: coronal mpr · coronal · 0.54mm/px · 1 of 153 slices shown]
[im 77/153  mediastinal]
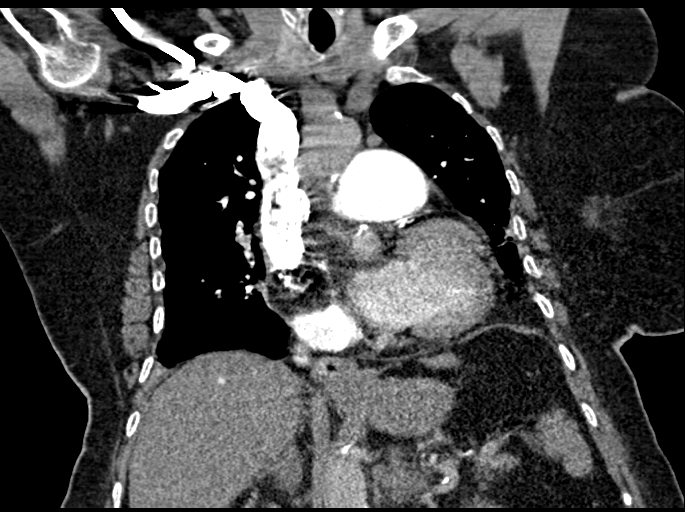

[17 of 36 positions shown; findings below may reference images not displayed]

FINDINGS: Cardiovascular: Satisfactory opacification of the pulmonary
arteries. Evaluation of the distal pulmonary arterial branches
within the bilateral lung bases is degraded by respiratory motion
artifact. No filling defect is visualized to the segmental branch
levels. Main pulmonary trunk is dilated measuring 4.3 cm in diameter
suggestive of underlying pulmonary arterial hypertension. Thoracic
aorta is nonaneurysmal. Atherosclerotic calcifications of the aorta
and coronary arteries. Cardiomegaly. No pericardial effusion.

Mediastinum/Nodes: Mildly prominent precarinal lymph node measures
11 mm short axis (series 5, image 84). Mildly prominent right hilar
lymph node measuring approximately 11 mm short axis (series 5, image
110). Mildly prominent left hilar lymph node measuring 10 mm (series
5, image 112) no axillary lymphadenopathy. Thyroid and trachea
within normal limits. Small hiatal hernia. No esophageal abnormality
is evident.

Lungs/Pleura: Dependent consolidations within the bilateral lower
lobes and lingula. Additional patchy areas of consolidation
predominantly within the dependent regions, some of which have a
somewhat nodular configuration including 10 mm nodular opacity in
the superior segment of the left lower lobe (series 10, image 51)
and 8 mm nodular opacity within the posterior right middle lobe
(series 10, image 88). Calcified pleural plaque at the posterior
right lung base. No pleural effusion or pneumothorax. Diffuse
bronchial wall thickening bilaterally.

Upper Abdomen: Reflux of contrast into the IVC and hepatic veins. No
acute findings are seen within the visualized upper abdomen.

Musculoskeletal: Multilevel thoracic spondylosis with exaggerated
thoracic kyphosis. No acute bony findings. No chest wall
abnormality.

Review of the MIP images confirms the above findings.
IMPRESSION: 1. Negative for pulmonary embolism to the segmental branch levels.
2. Dependent consolidations within the bilateral lower lobes and
lingula with additional patchy areas of opacity. Findings are
favored to represent multifocal pneumonia. Distribution raises the
suspicion for aspiration.
3. Some of the focal areas of airspace opacity have a somewhat
nodular configuration. A repeat chest CT in 3 months is recommended
to exclude an underlying pulmonary nodule following a trial of
antibiotic therapy.
4. Mildly prominent mediastinal and bilateral hilar lymph nodes,
likely reactive.
5. Cardiomegaly with evidence of underlying pulmonary arterial
hypertension.
6. Reflux of contrast into the IVC and hepatic veins suggesting
right heart dysfunction.
7. Small hiatal hernia.

Aortic Atherosclerosis ([id number]).

## 2023-02-12 IMAGING — US US ABDOMEN LIMITED
1 series · 15 of 25 positions shown · non-contrast
Comparison: CTA chest 09/22/2020.

CLINICAL DATA: 79-year-old female with abnormal LFTs.

EXAM:
ULTRASOUND ABDOMEN LIMITED RIGHT UPPER QUADRANT

[Series 1: us abdomen limited ruq mc & wl · 15 of 34 slices shown]
[im 1/34]
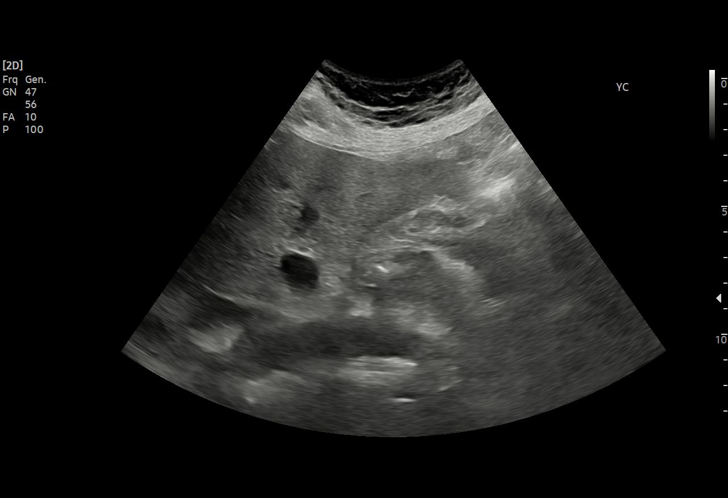
[im 3/34]
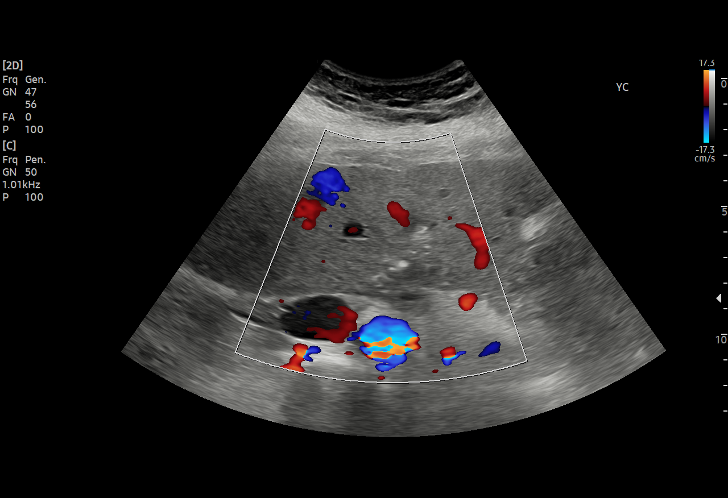
[im 6/34]
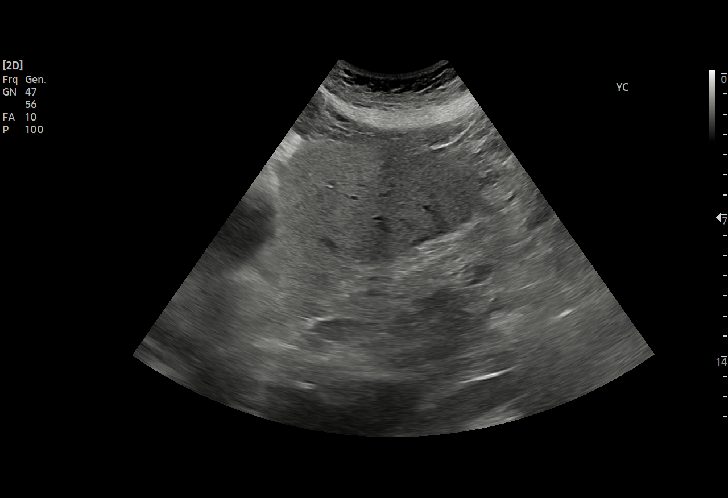
[im 7/34]
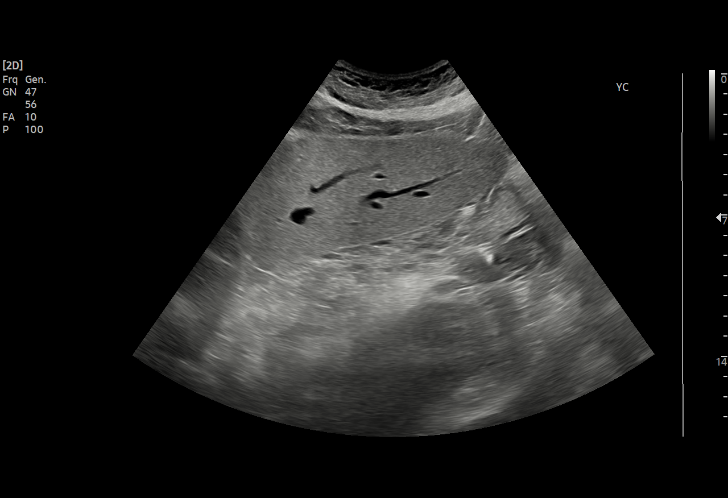
[im 10/34]
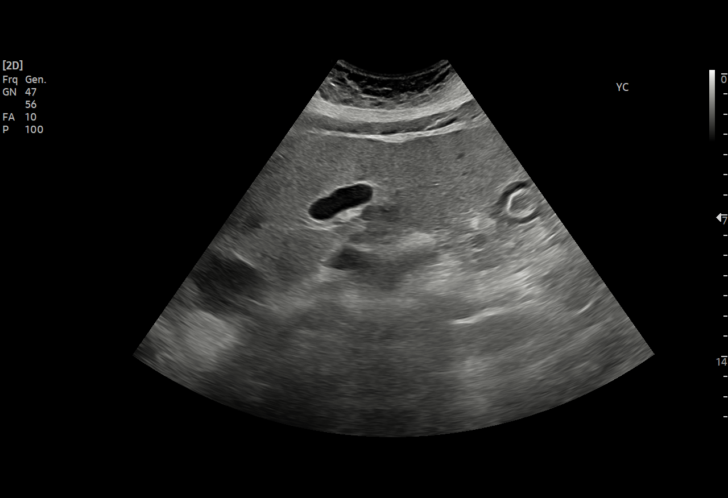
[im 13/34]
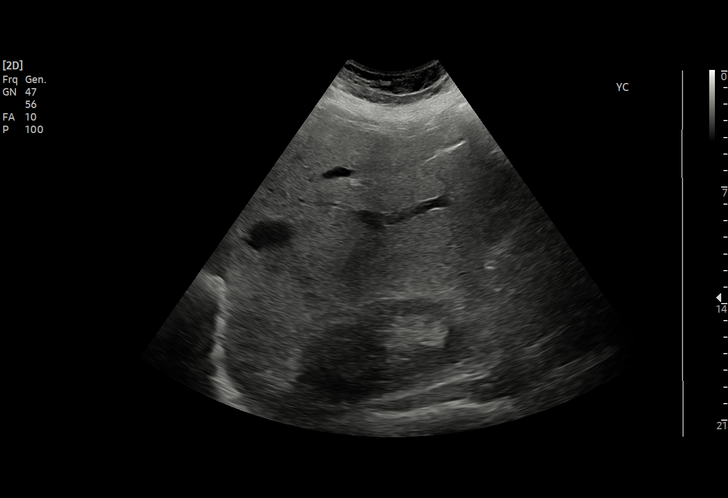
[im 14/34]
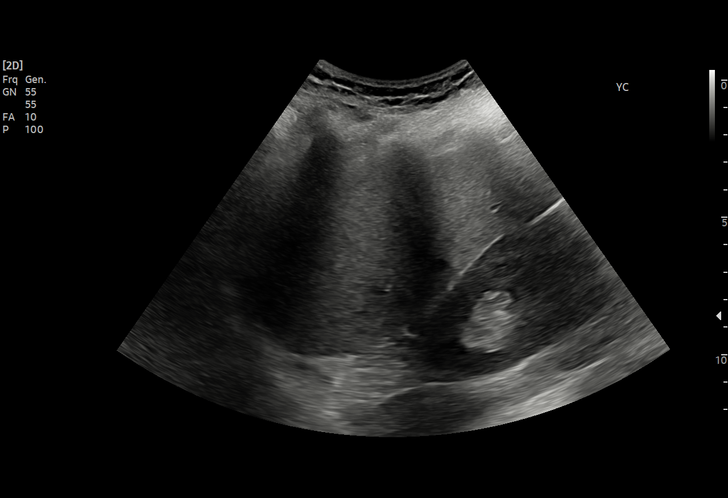
[im 17/34]
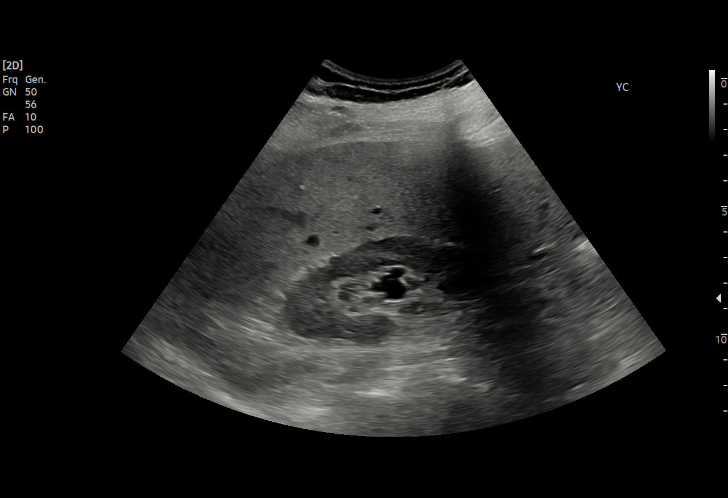
[im 20/34]
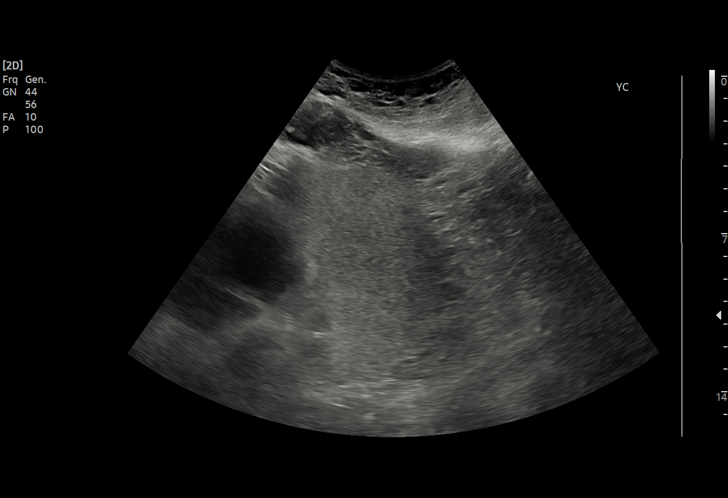
[im 21/34]
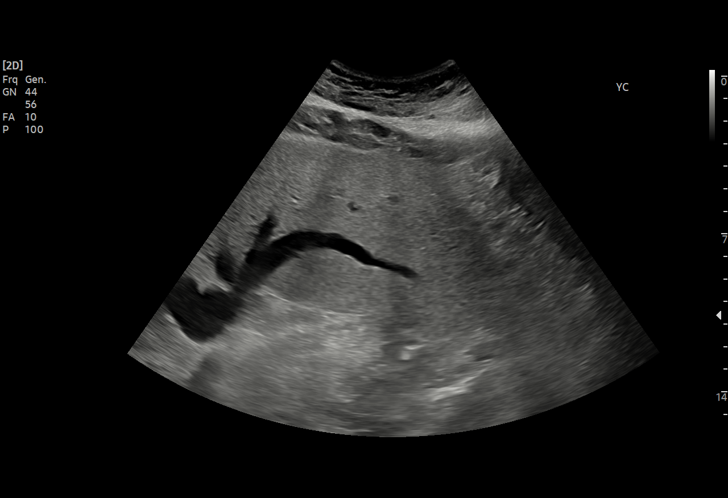
[im 24/34]
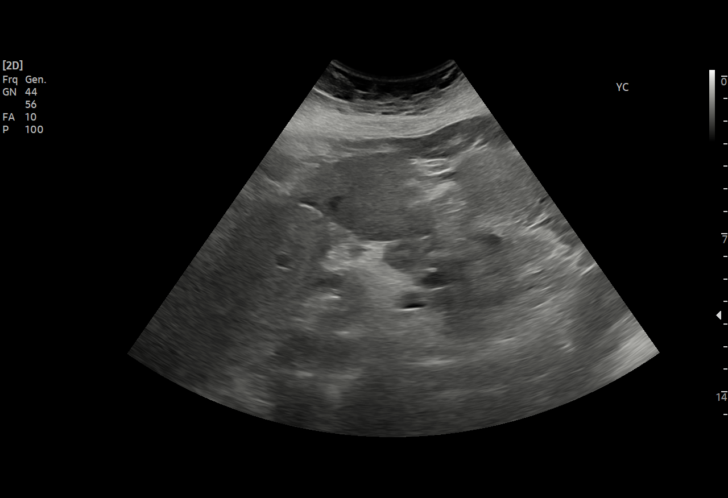
[im 27/34]
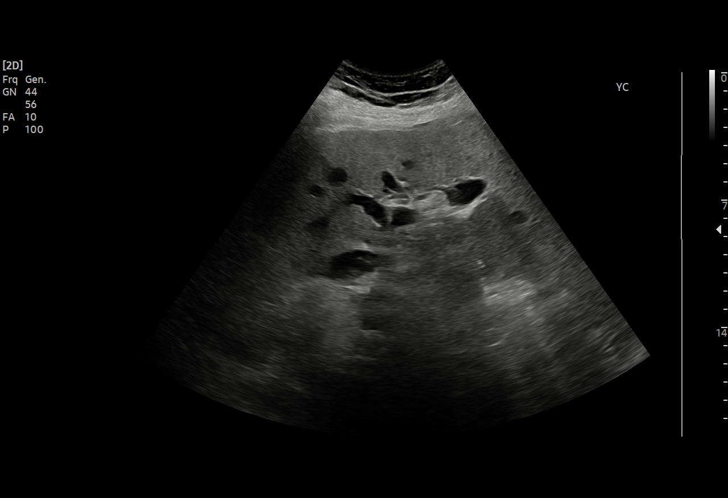
[im 28/34]
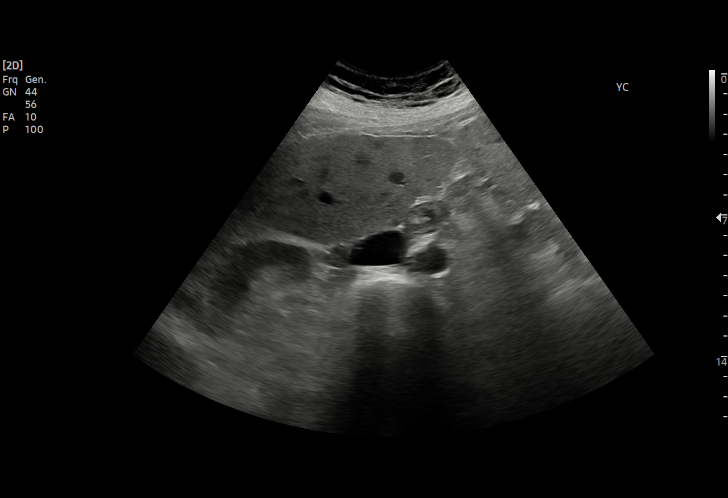
[im 31/34]
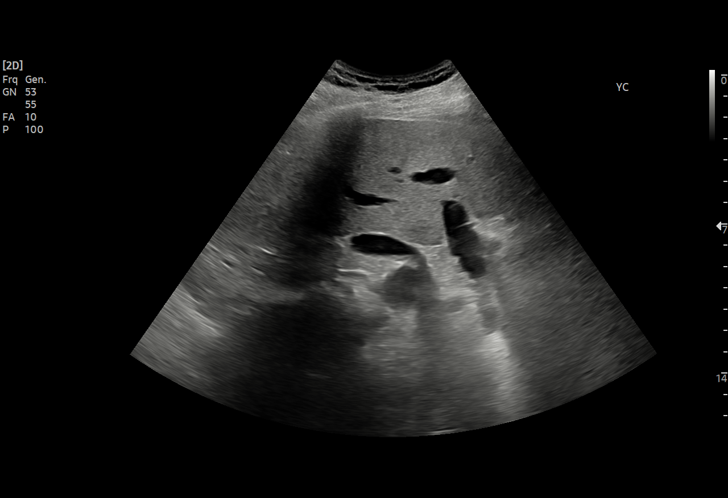
[im 34/34]
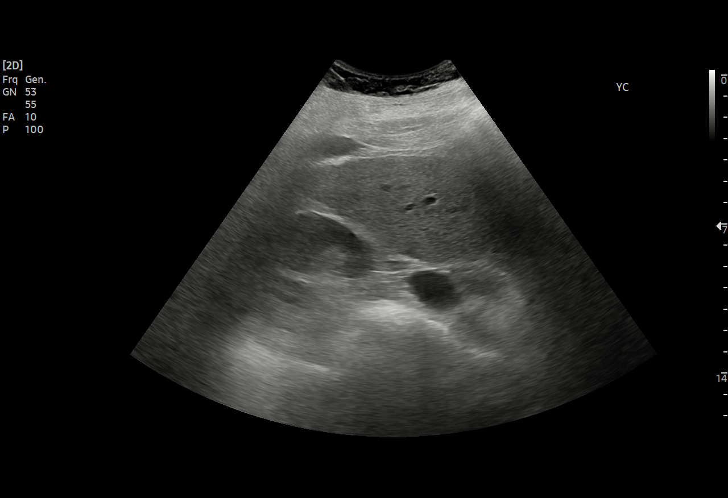

[15 of 25 positions shown; findings below may reference images not displayed]

FINDINGS: Gallbladder:

Absent as seen on the CTA yesterday.

Common bile duct:

Diameter: 3 mm, normal.

Liver:

Echogenic liver (images 14, 18). No discrete liver lesion. Portal
vein is patent on color Doppler imaging with normal direction of
blood flow towards the liver. Mildly nodular liver contour on some
images such (images 29 and 35).

Other: Visible right kidney is within normal limits.  No free fluid.
IMPRESSION: 1. Hepatic steatosis, with mildly nodular liver contour raising the
possibility of cirrhosis.
2. Absent gallbladder with no evidence of bile duct obstruction.
